# Patient Record
Sex: Female | Born: 1990 | Race: White | Hispanic: No | Marital: Single | Smoking: Never smoker
Health system: Southern US, Community
[De-identification: ages and names within clinical notes are randomized; demographics above are authoritative.]

## PROBLEM LIST (undated history)

## (undated) VITALS — BP 103/65 | HR 98 | Temp 98.1°F | Resp 19

## (undated) DIAGNOSIS — R87629 Unspecified abnormal cytological findings in specimens from vagina: Secondary | ICD-10-CM

## (undated) DIAGNOSIS — I10 Essential (primary) hypertension: Secondary | ICD-10-CM

## (undated) DIAGNOSIS — E0590 Thyrotoxicosis, unspecified without thyrotoxic crisis or storm: Secondary | ICD-10-CM

## (undated) DIAGNOSIS — H3530 Unspecified macular degeneration: Secondary | ICD-10-CM

## (undated) DIAGNOSIS — I619 Nontraumatic intracerebral hemorrhage, unspecified: Secondary | ICD-10-CM

## (undated) DIAGNOSIS — E109 Type 1 diabetes mellitus without complications: Secondary | ICD-10-CM

## (undated) DIAGNOSIS — E039 Hypothyroidism, unspecified: Secondary | ICD-10-CM

## (undated) DIAGNOSIS — Q282 Arteriovenous malformation of cerebral vessels: Secondary | ICD-10-CM

## (undated) DIAGNOSIS — M419 Scoliosis, unspecified: Secondary | ICD-10-CM

## (undated) DIAGNOSIS — F419 Anxiety disorder, unspecified: Secondary | ICD-10-CM

## (undated) DIAGNOSIS — K900 Celiac disease: Secondary | ICD-10-CM

## (undated) DIAGNOSIS — G6181 Chronic inflammatory demyelinating polyneuritis: Secondary | ICD-10-CM

## (undated) HISTORY — PX: OTHER SURGICAL HISTORY: SHX169

## (undated) HISTORY — DX: Arteriovenous malformation of cerebral vessels: Q28.2

## (undated) HISTORY — DX: Celiac disease: K90.0

## (undated) HISTORY — DX: Essential (primary) hypertension: I10

## (undated) HISTORY — DX: Scoliosis, unspecified: M41.9

## (undated) HISTORY — PX: HIP ARTHROPLASTY: SHX981

## (undated) HISTORY — PX: PORTACATH PLACEMENT: SHX2246

## (undated) HISTORY — DX: Unspecified abnormal cytological findings in specimens from vagina: R87.629

## (undated) HISTORY — DX: Hypothyroidism, unspecified: E03.9

## (undated) HISTORY — DX: Anxiety disorder, unspecified: F41.9

## (undated) HISTORY — DX: Nontraumatic intracerebral hemorrhage, unspecified: I61.9

## (undated) HISTORY — PX: FOOT ARTHROPLASTY: SHX1657

## (undated) HISTORY — DX: Type 1 diabetes mellitus without complications: E10.9

## (undated) HISTORY — DX: Unspecified macular degeneration: H35.30

## (undated) HISTORY — DX: Chronic inflammatory demyelinating polyneuritis: G61.81

## (undated) HISTORY — DX: Thyrotoxicosis, unspecified without thyrotoxic crisis or storm: E05.90

---
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
## 2012-02-25 ENCOUNTER — Encounter: Payer: Self-pay | Admitting: "Endocrinology

## 2012-02-25 ENCOUNTER — Other Ambulatory Visit: Payer: Self-pay | Admitting: *Deleted

## 2012-02-25 ENCOUNTER — Ambulatory Visit (INDEPENDENT_AMBULATORY_CARE_PROVIDER_SITE_OTHER): Payer: BC Managed Care – PPO | Admitting: "Endocrinology

## 2012-02-25 VITALS — BP 114/78 | HR 104 | Wt 110.5 lb

## 2012-02-25 DIAGNOSIS — E1049 Type 1 diabetes mellitus with other diabetic neurological complication: Secondary | ICD-10-CM

## 2012-02-25 DIAGNOSIS — E0500 Thyrotoxicosis with diffuse goiter without thyrotoxic crisis or storm: Secondary | ICD-10-CM | POA: Insufficient documentation

## 2012-02-25 DIAGNOSIS — R000 Tachycardia, unspecified: Secondary | ICD-10-CM

## 2012-02-25 DIAGNOSIS — E1043 Type 1 diabetes mellitus with diabetic autonomic (poly)neuropathy: Secondary | ICD-10-CM

## 2012-02-25 DIAGNOSIS — E049 Nontoxic goiter, unspecified: Secondary | ICD-10-CM | POA: Insufficient documentation

## 2012-02-25 DIAGNOSIS — E063 Autoimmune thyroiditis: Secondary | ICD-10-CM

## 2012-02-25 DIAGNOSIS — E1169 Type 2 diabetes mellitus with other specified complication: Secondary | ICD-10-CM

## 2012-02-25 DIAGNOSIS — E11649 Type 2 diabetes mellitus with hypoglycemia without coma: Secondary | ICD-10-CM

## 2012-02-25 DIAGNOSIS — R5381 Other malaise: Secondary | ICD-10-CM

## 2012-02-25 DIAGNOSIS — K900 Celiac disease: Secondary | ICD-10-CM

## 2012-02-25 DIAGNOSIS — IMO0002 Reserved for concepts with insufficient information to code with codable children: Secondary | ICD-10-CM

## 2012-02-25 DIAGNOSIS — E038 Other specified hypothyroidism: Secondary | ICD-10-CM

## 2012-02-25 DIAGNOSIS — G909 Disorder of the autonomic nervous system, unspecified: Secondary | ICD-10-CM

## 2012-02-25 DIAGNOSIS — E1065 Type 1 diabetes mellitus with hyperglycemia: Secondary | ICD-10-CM

## 2012-02-25 DIAGNOSIS — R5383 Other fatigue: Secondary | ICD-10-CM

## 2012-02-25 HISTORY — DX: Autoimmune thyroiditis: E06.3

## 2012-02-25 HISTORY — DX: Thyrotoxicosis with diffuse goiter without thyrotoxic crisis or storm: E05.00

## 2012-02-25 LAB — COMPREHENSIVE METABOLIC PANEL
ALT: 17 U/L (ref 0–35)
AST: 26 U/L (ref 0–37)
Albumin: 3.4 g/dL — ABNORMAL LOW (ref 3.5–5.2)
CO2: 24 mEq/L (ref 19–32)
Calcium: 9.5 mg/dL (ref 8.4–10.5)
Chloride: 95 mEq/L — ABNORMAL LOW (ref 96–112)
Potassium: 3.8 mEq/L (ref 3.5–5.3)
Total Protein: 12.2 g/dL — ABNORMAL HIGH (ref 6.0–8.3)

## 2012-02-25 LAB — T4, FREE: Free T4: 1.45 ng/dL (ref 0.80–1.80)

## 2012-02-25 LAB — TSH: TSH: 1.044 u[IU]/mL (ref 0.350–4.500)

## 2012-02-25 NOTE — Patient Instructions (Addendum)
Follow up visit in 3 months. Please call me in two weeks, on Wednesday or Sunday evenings between 8-10 PM to discuss BG results.

## 2012-02-25 NOTE — Progress Notes (Signed)
CHIEF COMPLAINT: The patient presents for follow-up of type 1 diabetes mellitus, chronic inflammatory demyelinating polyneuropathy (CIDP), celiac disease, muscle weakness, limited endurance, goiter, thyroiditis, tachycardia, autonomic neuropathy, hypoglycemia, hyperthyroidism secondary to Graves' disease, hyperkalemia, anemia, iron deficiency, hypothyroid secondary to Hashimoto's thyroiditis, and fatigue.  HISTORY OF PRESENT ILLNESS: The patient is a 21 year-old Caucasian young woman. The patient was unaccompanied for the first time. 1. This very complicated 21 year-old white female patient was first referred to me on 09/25/06 by her primary care provider, Dr. Garnet Koyanagi, for evaluation and management of type I diabetes mellitus, hypoglycemia, and a myriad of other autoimmune issues.   A. CIDP: At about age 74 the child began to limp.  At age 22 she was evaluated at [facility] and diagnosed with CIDP. At the time I first met her she was under the care of Dr. Marcene Brawn of [facility] in [place]. She was on a regular regimen of intravenous treatments at home with IVIG and steroids on the days of therapy. The IVIG treatments were given monthly. She was also on cyclosporine A daily, alternating doses every other day. Because of the CIDP she had  significant problems with muscle atrophy and weakness, as well as multiple orthopedic issues. She was also being followed by Dr. Simonne Come, an orthopedist in [place]. On that first visit her legs and arms were quite weak. She found it very hard to walk or to exercise.  B. Type 1 diabetes mellitus: Her diabetes mellitus was also diagnosed at age 51, just prior to the diagnosis of CIDP. She had been started on a Medtronic Paradigm 712 insulin pump subsequently and was using Humalog lispro insulin in her pump. She had a very difficult time with blood glucose control. At the time of her steroids, her sugars tended to run very high. However, when she  was able to perform some physical activities, she tended to develop hypoglycemia. Her hemoglobin A1c on that first visit was 9.5%.  C. Celiac disease: The patient was diagnosed with celiac disease at approximately age 10. She had been on a gluten-free diet ever since.  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 She was followed both at [facility] and [facility] eye clinic. 2. Since that initial visit with me, I have followed her for the problems listed above plus other new problems that have developed over time.   A. CIDP: Her CIDP course has waxed and waned. At one point she had improved significantly, her medication doses had been reduced, she felt very much stronger, had more stamina, and was doing very well. Unfortunately, she later had a significant flare-up of CIDP and lost much of the improvement that she had gained. Since going to [facility] this past Spring, however, and starting a new and more intensified IVIG regimen, her strength has improved. She is still very weak physically and muscularly, however, and needs support to walk.  B. Type 1 diabetes mellitus: The patient's blood sugars have varied significantly with the course of her CIDP and the treatments for that disease. Her hemoglobin A1c values have ranged from a low of 8.3 to a high of 11.5. Most of her A1c values have been in the 8.4-9.6 range.  C. Celiac disease: The patient has really done quite well over the years. She rarely has signs or symptoms of abdominal problems. Because her antibody levels fluctuate a  great deal, she developed negative celiac antibodies in March 2012. She was mistakenly told that she did not have celiac disease and that she could resume a normal diet. As expected, resumption of a normal diet caused a recurrence of active celiac disease. In October she saw a gastroenterologist, Dr.  Gale Journey, at [facility] who told her that she did have celiac disease. She is now back on her gluten-free diet and is asymptomatic again.  D. Autoimmune thyroid disease: The patient had a goiter and a TPO antibody level of 287.2 on her first visit in May of 2008, consistent with evolving Hashimoto's Disease. She occasionally had the sensation of swelling and discomfort in her anterior neck, which was also c/w flare-ups of Hashimoto's Disease. In early 2009 she developed increased tachycardia. Lab tests performed in March 2000 by her primary care provider showed a TSH of 0.01 and a T4 of 7.7. It was felt that the increase in tachycardia was likely due to a combination of autonomic neuropathy and Hashitoxicosis.  However on follow-up laboratory tests performed in July, her TSH was 0.05, free T4 3.9, and free T3 14.2. Her TSI level done on the assay used at the time was 2.2, with normal being 1.0 or less. It was evident that she had developed Graves' Disease in the setting of pre-existing Hashimoto's Disease.  I initiated treatment with methimazole, 10 mg twice daily and propranolol 20 mg 3 times daily. Since then we've seen significant swings of her thyroid function tests, partly due to changes in methimazole doses, but also partly due to changes in her immunosuppressant therapies. During the last 2 years her TSH values have ranged from a low of 0.1432 to a high of 14.933. The dose of methimazole has been adjusted as needed to treat her Graves' disease. It has been my hope that her Hashimoto's disease will eventually cancel out her Graves' disease. 3.The patient's last [facility] visit was on 10/22/11. In the interim, she has admittedly not been doing a very good job of taking care of her DM. She has been concentrating more on her CIDP and her celiac disease. She has again reduced her steroid dosage by 25% because she wants to try to taper off from the steroids if possible. Since reducing the steroids she feels weaker, but wants  to give the taper a good try. She still receives her IVIG regimen on Mondays and Thursdays. She receives steroids now on every other Monday, 250 mg of methylprednisolone by IV. She takes methimazole, 2.5 mg daily and lisinopril 2.5 daily. She is also taking propranolol, ranitidine, gabapentin, iron, and vitamin D. 4. Pertinent Review of Systems: Constitutional: The patient feels "good". She feels weak every day, both physically in her arms and legs and in terms of stamina.   Eyes: Her new contact lenses are helping. Last eye exam was in March or April of this year.  Neck: The patient has had no problems with anterior neck swelling and tenderness for a year or more.   Heart: Heart rate increases with exercise or other physical activity. The patient has no complaints of palpitations, irregular heart beats, chest pain, or chest pressure.   Gastrointestinal: Bowel movents seem normal. The patient has no complaints of excessive hunger, acid reflux, upset stomach, stomach aches or pains, diarrhea, or constipation.  Legs: Muscle mass and strength seem to be improved. There are few complaints of numbness, tingling, burning, or pain. No edema is noted.  Feet: There are no obvious new foot problems. There are  no complaints of numbness, tingling, burning, or pain. No edema is noted. Neurologic: She can walk with assistance better and can walk for limited distances without support. Muscle movement and strength have improved in her arms and legs. Sensation seems to be normal. Coordination is better. GYN: Her LMP was one week ago. Menses have been more regular lately.   Hypoglycemia: She has hypoglycemia sometimes in the afternoons.  5. Blood glucose printout: She changes her pump site every 1-7 days. She has only checked BGs 4 times in the pat month. All BG were > 400. She says that she does not feel depressed. On the contrary, she feels better than ever.   PAST MEDICAL, FAMILY, AND SOCIAL HISTORY: 1. School: The  patient finished high school. She takes some courses on line with [employer].   2. Activities: She plays the drums for exercise. She is due to go back to [facility] in March 2014 for her annual checkup. 3. Smoking, alcohol, or drugs: None 4. Primary Care Provider: Dr. Garnet Koyanagi at [facility].  ROS: There are no other significant problems involving the patient's other body systems.  PHYSICAL EXAM: BP 114/78  Pulse 104  Wt 110 lb 8 oz (50.122 kg) Hemoglobin A1c is 10.7% today, compared with  9.7% at last visit and with 8.9% at the visit prior. Her affect is fairly flat. Constitutional: This patient appears reasonably healthy, but weaker than at last visit. The patient's height is at the 46% and her weight is at the 18%. She needs the help of her mother to stand from a sitting position and to walk.   Head: The head is normocephalic. Face: The face appears normal. There are no obvious dysmorphic features. Eyes: The eyes appear to be normally formed and spaced. Gaze is conjugate. There is no obvious arcus or proptosis. Moisture appears normal. Mouth: The oropharynx and tongue appear normal. Dentition appears to be normal for age. Oral moisture is normal. Neck: The neck appears to be visibly normal. No carotid bruits are noted. The thyroid gland is the 22-23 grams in size. The gland is relatively globular and firm, but the left lobe is smaller and softer today. The thyroid gland is not tender to palpation. Lungs: The lungs are clear to auscultation. Air movement is good. Heart: Heart rate and rhythm are regular. Heart sounds S1 and S2 are normal. I did not appreciate any pathologic cardiac murmurs. Abdomen: The abdomen is normal in size for the patient's age. Bowel sounds are normal. There is no obvious hepatomegaly, splenomegaly, or other mass effect.  Arms: Muscle size and bulk are low-normal for age. Hands: There is a slight tremor today. Phalangeal and metacarpophalangeal joints are normal. Palmar  muscles are low-normal for age. Palmar skin is normal. Palmar moisture is also normal. Legs: Muscles appear low-normal for age. No edema is present. Feet: Her feet are cool. Dorsalis pedal pulses are 1+ on the right and 2+ on the left.   Neurologic: Strength is 4-5/5 for age in the upper extremities and 3-4/5 in the right leg and 4+/5 in the left leg. Muscle tone is relatively normal. Sensation to touch is normal in both the legs and feet.    LAB DATA: 06/14/11: TSH was 1.633. Free T4 1.47. Free T3 was 3.1. TSI was 56 (normal less than 120).                       ASSESSMENT: 1. Type 1 diabetes mellitus: The patient's BG control  has worsened as a result of her not being as compliant as she could be. 2. Hypoglycemia: None    3. Chronic inflammatory demyelinating polyneuropathy (CIDP) and muscle weakness: She is not doing as well since reducing her steroids. She feels subjectively weaker.    4. Autoimmune thyroid disease: The patient is clinically euthyroid today or perhaps mildly hypothyroid. We'll see what her TFts show. I hope that we will be able to stop the methimazole within this calendar year.  5. Goiter: Thyroid is smaller and somewhat less firm today. 6. Autonomic neuropathy with tachycardia: The heart rate is higher, c/w poorer BG control.  7. Hashimoto's thyroiditis: The patient's thyroiditis is clinically quiescent at this time. I expect that as her Hashimoto's disease progressively destroys more thyroid cells, we will be able to taper her methimazole to zero. She will eventually be permanently hypothyroid and be on Synthroid hormone replacement. 8. Fatigue and decreased stamina: These problems have deteriorated somewhat since reducing her steroid dose.   9. Thyrotoxicosis: Her diffuse thyrotoxicosis, secondary to Graves' disease, is gradually improving over time with methimazole therapy.  10. Celiac disease: Patient is back on her gluten-free diet. She is asymptomatic.  11. Emotionality:  The patient is doing better.  PLAN: 1. Diagnostic: She went to the lab today to have blood drawn for thyroid function tests, TSI, CMP and urine for  microalbumin/creatinine ratio. Call in two weeks to discuss BGs and possible changes to there insulin Plan.  2. Therapeutic: The patient will maintain her current insulin pump basal rate settings. I suggested again that she use increased temporary basal rates of 110-120% on days she receives methylprednisolone therapy to control BGs better. Unfortunately, she is unwilling to follow that suggestion. I will adjust methimazole doses according to her TFT results.  3. Patient education: We discussed the need to be as consistent as possible with pump site changes, BG checks, and insulin boluses. Given the various effects that her methylprednisolone and IVIG can have on her BGs day-to-day, The more consistent she can be, the fewer variables we will have to deal with.  4. Follow-up: 3 months  Level of Service: This visit lasted in excess of 40 minutes. More than 50% of the visit was devoted to counseling.  Sherrlyn Hock

## 2012-02-26 ENCOUNTER — Telehealth: Payer: Self-pay | Admitting: *Deleted

## 2012-02-26 NOTE — Telephone Encounter (Signed)
Per in-basket message from Maryfrances Bunnell, Mom called requesting that [facility] lab results when they come in be faxed to Dr. Rush Landmark Hickling's office. I returned Mom's call to let her  Know that Dr. Melanee Left office has access to [facility] and can retrieve Jessica Kelley's lab results there.

## 2012-03-10 ENCOUNTER — Ambulatory Visit: Payer: BC Managed Care – PPO | Admitting: "Endocrinology

## 2012-04-22 ENCOUNTER — Other Ambulatory Visit: Payer: Self-pay | Admitting: "Endocrinology

## 2012-04-27 ENCOUNTER — Other Ambulatory Visit: Payer: Self-pay | Admitting: *Deleted

## 2012-04-27 DIAGNOSIS — E1065 Type 1 diabetes mellitus with hyperglycemia: Secondary | ICD-10-CM

## 2012-04-27 MED ORDER — INSULIN ASPART 100 UNIT/ML ~~LOC~~ SOLN: SUBCUTANEOUS | Status: DC

## 2012-05-03 ENCOUNTER — Encounter (HOSPITAL_BASED_OUTPATIENT_CLINIC_OR_DEPARTMENT_OTHER): Payer: Self-pay | Admitting: *Deleted

## 2012-05-03 ENCOUNTER — Emergency Department (HOSPITAL_BASED_OUTPATIENT_CLINIC_OR_DEPARTMENT_OTHER): Payer: BC Managed Care – PPO

## 2012-05-03 ENCOUNTER — Emergency Department (HOSPITAL_BASED_OUTPATIENT_CLINIC_OR_DEPARTMENT_OTHER)
Admission: EM | Admit: 2012-05-03 | Discharge: 2012-05-04 | Disposition: A | Discharge status: Home / Self Care | Payer: BC Managed Care – PPO | Attending: Emergency Medicine | Admitting: Emergency Medicine

## 2012-05-03 DIAGNOSIS — Z794 Long term (current) use of insulin: Secondary | ICD-10-CM | POA: Insufficient documentation

## 2012-05-03 DIAGNOSIS — Z8669 Personal history of other diseases of the nervous system and sense organs: Secondary | ICD-10-CM | POA: Insufficient documentation

## 2012-05-03 DIAGNOSIS — K900 Celiac disease: Secondary | ICD-10-CM | POA: Insufficient documentation

## 2012-05-03 DIAGNOSIS — E039 Hypothyroidism, unspecified: Secondary | ICD-10-CM | POA: Insufficient documentation

## 2012-05-03 DIAGNOSIS — Q859 Phakomatosis, unspecified: Secondary | ICD-10-CM | POA: Insufficient documentation

## 2012-05-03 DIAGNOSIS — S0101XA Laceration without foreign body of scalp, initial encounter: Secondary | ICD-10-CM

## 2012-05-03 DIAGNOSIS — E109 Type 1 diabetes mellitus without complications: Secondary | ICD-10-CM | POA: Insufficient documentation

## 2012-05-03 DIAGNOSIS — Z79899 Other long term (current) drug therapy: Secondary | ICD-10-CM | POA: Insufficient documentation

## 2012-05-03 DIAGNOSIS — Y92009 Unspecified place in unspecified non-institutional (private) residence as the place of occurrence of the external cause: Secondary | ICD-10-CM | POA: Insufficient documentation

## 2012-05-03 DIAGNOSIS — W010XXA Fall on same level from slipping, tripping and stumbling without subsequent striking against object, initial encounter: Secondary | ICD-10-CM | POA: Insufficient documentation

## 2012-05-03 DIAGNOSIS — Y9301 Activity, walking, marching and hiking: Secondary | ICD-10-CM | POA: Insufficient documentation

## 2012-05-03 DIAGNOSIS — S0990XA Unspecified injury of head, initial encounter: Secondary | ICD-10-CM | POA: Insufficient documentation

## 2012-05-03 DIAGNOSIS — M4120 Other idiopathic scoliosis, site unspecified: Secondary | ICD-10-CM | POA: Insufficient documentation

## 2012-05-03 DIAGNOSIS — Z872 Personal history of diseases of the skin and subcutaneous tissue: Secondary | ICD-10-CM | POA: Insufficient documentation

## 2012-05-03 NOTE — ED Notes (Addendum)
Pt. Has a hx CIDP causing general weakness with right side being the worst. Pt was walking into her house, lost her balance and fell hitting the posterior aspect of her head. Denies loc. Unable to assess wound in triage due to dried blood in her hair. Denies any vomiting. Pt. C/o intermittent dizziness

## 2012-05-03 NOTE — ED Notes (Signed)
Patient fell at home about forty min. Ago, laceration to back of head with above normal blood loss. Patient's father stated she did not lose concioussness. Father states patient has right leg numbness but feels this is from her auto-immune decease that is pre-existing and that she is a diabetic.

## 2012-05-04 MED ORDER — LIDOCAINE HCL 2 % IJ SOLN
INTRAMUSCULAR | Status: AC
  Filled 2012-05-04: qty 20

## 2012-05-04 MED ORDER — LIDOCAINE-EPINEPHRINE-TETRACAINE (LET) SOLUTION
3.0000 mL | Freq: Once | NASAL | Status: AC
  Administered 2012-05-04: 3 mL via TOPICAL
  Filled 2012-05-04: qty 3

## 2012-05-04 MED ORDER — HYDROCODONE-ACETAMINOPHEN 5-500 MG PO TABS: 1.0000 | ORAL_TABLET | Freq: Four times a day (QID) | ORAL | Status: DC | PRN

## 2012-05-04 MED ORDER — LORAZEPAM 1 MG PO TABS
0.5000 mg | ORAL_TABLET | Freq: Once | ORAL | Status: AC
  Administered 2012-05-04: 0.5 mg via ORAL
  Filled 2012-05-04: qty 1

## 2012-05-04 NOTE — ED Provider Notes (Signed)
History  This chart was scribed for Syliva Mee Alfonso Patten, MD by Roe Coombs, ED Scribe. The patient was seen in room MH11/MH11. Patient's care was started at [facility].  CSN: [id number]  Arrival date & time 05/03/12  2229   First MD Initiated Contact with Patient 05/04/12 0005      Chief Complaint  Patient presents with  . Fall    Patient is a 21 y.o. female presenting with scalp laceration and extremity pain. The history is provided by the patient. No language interpreter was used.  Head Laceration This is a new problem. The current episode started 3 to 5 hours ago. The problem occurs constantly. The problem has not changed since onset.Nothing aggravates the symptoms. Nothing relieves the symptoms. She has tried nothing for the symptoms.  Extremity Pain This is a new problem. Episode onset: 2.5 hours ago. The problem occurs constantly. The problem has not changed since onset.   HPI Comments: Jessica Kelley is a 21 y.o. female who presents to the Emergency Department complaining of laceration to the occipital area of the head and moderate, constant, non-radiating, dull right elbow pain resulting from a fall that occurred 2.5 hours ago. Patient says there was a moderate amount of blood loss from the wound site. There is associated dizziness. Patient denies loss of consciousness or seizure activity. Patient states that she fell at home onto concrete immediately PTA. She says that she generally has balance problems with baseline weakness in her right lower extremity which probably contributed to her fall. Patient denies any other injuries or symptoms at this time. Patient has a medical history of Type I diabetes, macular degeneration, celiac disease, hypothyroidism, Wyburn-Mason syndrome, scoliosis, chronic inflammatory demyelinating polyneuropathy and hypothyroidism.  Tdap is UTD. Patient does not smoke.    Past Medical History  Diagnosis Date  . Diabetes type I     Dr. Karsten Ro  . Celiac  disease   . Macular degeneration   . Hypothyroidism   . Wyburn-Mason syndrome   . Glaucoma(365)   . Scoliosis   . CIDP (chronic inflammatory demyelinating polyneuropathy) 2001  . Pilonidal cyst     Past Surgical History  Procedure Date  . Cataract extraction 06/25/06  . Glaucoma surgery 02/12/06  . Foot arthroplasty     left foot moved and stretched tendons   . Hip arthroplasty     right never bipopsy  . Portacath placement 2002-2004    Family History  Problem Relation Age of Onset  . Prostate cancer Maternal Grandfather   . Cancer Maternal Grandfather     prostate  . Coronary artery disease Paternal Grandfather   . Hyperlipidemia    . Diabetes Sister   . Other Sister     thyroid issues  . Thyroid disease Mother     History  Substance Use Topics  . Smoking status: Never Smoker   . Smokeless tobacco: Never Used     Comment: passive smoke exposure  . Alcohol Use: No    OB History    Grav Para Term Preterm Abortions TAB SAB Ect Mult Living                  Review of Systems  Musculoskeletal: Positive for arthralgias (Positive for right elbow pain.).  Skin: Positive for wound (lac to occipital area).  Neurological: Positive for dizziness. Negative for seizures.  All other systems reviewed and are negative.    Allergies  Review of patient's allergies indicates no known allergies.  Home Medications  Current Outpatient Rx  Name  Route  Sig  Dispense  Refill  . VITAMIN D 1000 UNITS PO CAPS   Oral   Take 1,000 Units by mouth daily.           Marland Kitchen VITAMIN B-12 PO   Oral   Take by mouth.         . FERROUS SULFATE 27 MG PO TABS   Oral   Take by mouth 2 (two) times daily.           Marland Kitchen GABAPENTIN 100 MG PO CAPS   Oral   Take 100 mg by mouth 3 (three) times daily.           . IBUPROFEN 200 MG PO TABS   Oral   Take 600 mg by mouth every 8 (eight) hours. 2 days per week following treatment          . IMMUNE GLOBULIN (HUMAN) 10 G IV SOLR    Intravenous   Inject 50 mg into the vein 2 (two) times a week.          . INSULIN ASPART 100 UNIT/ML [place] SOLN      Use with insulin pump, dispense 4 vials   10 mL   6   . LISINOPRIL 2.5 MG PO TABS   Oral   Take 1 tablet (2.5 mg total) by mouth daily.   30 tablet   11   . METHIMAZOLE 5 MG PO TABS   Oral   Take 0.5 tablets (2.5 mg total) by mouth daily.   16 tablet   3   . METHYLPREDNISOLONE 2 MG PO TABS   Intravenous   Inject 250 mg into the vein every 30 (thirty) days.          Marland Kitchen ONE-DAILY MULTI VITAMINS PO TABS   Oral   Take 1 tablet by mouth daily.           Marland Kitchen MYCOPHENOLATE MOFETIL 500 MG PO TABS   Oral   Take 1,000 mg by mouth 2 (two) times daily.          Marland Kitchen PRESCRIPTION MEDICATION      Gets IVIG treatment twice a week...could not verify concentration and dose          . PROPRANOLOL HCL 20 MG PO TABS      TAKE ONE TABLET BY MOUTH THREE TIMES DAILY   90 tablet   5   . RANITIDINE HCL 150 MG PO TABS   Oral   Take 150 mg by mouth 2 (two) times daily.           . SODIUM CHLORIDE 0.9 % IV SOLN   Intravenous   Inject into the vein continuous.             Triage Vitals: BP 134/88  Pulse 106  Temp 97.6 F (36.4 C) (Oral)  Resp 23  Ht 5' 4"  (1.626 m)  Wt 110 lb (49.896 kg)  BMI 18.88 kg/m2  SpO2 97%  LMP 04/26/2012  Physical Exam  Constitutional: She is oriented to person, place, and time. She appears well-developed and well-nourished. No distress.  HENT:  Head: Head is with laceration.    Right Ear: No hemotympanum.  Left Ear: No hemotympanum.  Mouth/Throat: Oropharynx is clear and moist.  Eyes: Pupils are equal, round, and reactive to light.  Neck: Normal range of motion. Neck supple.       No C spine tenderness  Cardiovascular: Normal rate, regular rhythm  and normal heart sounds.   No murmur heard. Pulmonary/Chest: Effort normal and breath sounds normal. No respiratory distress. She has no wheezes. She has no rales.  Abdominal:  Soft. Bowel sounds are normal. There is no tenderness. There is no rebound and no guarding.  Musculoskeletal: Normal range of motion. She exhibits no edema and no tenderness.       Full ROM of right elbow. Normal pronation and supination of the elbow. Biceps tendon is intact.   Negative anterior and posterior drawer test of the knees.   Neurological: She is alert and oriented to person, place, and time.  Skin: Skin is warm and dry.  Psychiatric: She has a normal mood and affect.    ED Course  Procedures (including critical care time) DIAGNOSTIC STUDIES: Oxygen Saturation is 97% on room air, adequate by my interpretation.    COORDINATION OF CARE: 12:32 AM- Patient informed of current plan for treatment and evaluation and agrees with plan at this time.   Labs Reviewed - No data to display  Ct Head Wo Contrast  05/04/2012  *RADIOLOGY REPORT*  Clinical Data: Fall, posterior head trauma.  CT HEAD WITHOUT CONTRAST  Technique:  Contiguous axial images were obtained from the base of the skull through the vertex without contrast.  Comparison: 06/04/2007 sinus CT  Findings: There is no evidence for acute hemorrhage, hydrocephalus, mass lesion, or abnormal extra-axial fluid collection.  No definite CT evidence for acute infarction.  The visualized paranasal sinuses and mastoid air cells are predominately clear.  Left posterior scalp swelling.  No underlying calvarial fracture.  IMPRESSION: Left posterior scalp swelling without underlying calvarial fracture or acute intracranial abnormality.   Original Report Authenticated By: Carlos Levering, M.D.      No diagnosis found.    MDM  LACERATION REPAIR Performed by: Carlisle Beers Authorized by: Carlisle Beers Consent: Verbal consent obtained. Risks and benefits: risks, benefits and alternatives were discussed Consent given by: patient Patient identity confirmed: provided demographic data Prepped and Draped in normal sterile  fashion Wound explored  Laceration Location: scalp posterior  2 cm  No Foreign Bodies seen or palpated  Anesthesia: topical first with LET and then  local infiltration  Local anesthetic: lidocaine 2%  Anesthetic total: 4 ml  Irrigation method: syringe Amount of cleaning: extensive   Skin closure: staples  Number: 4  Patient tolerance: Patient tolerated the procedure well with no immediate complications.    Follow up for staple removal in 7 days return for drainage fever or any concerns.  Family and patient verbalize understanding and agree to follow up     I personally performed the services described in this documentation, which was scribed in my presence. The recorded information has been reviewed and is accurate.     Carlisle Beers, MD 05/04/12 [phone]

## 2012-05-11 ENCOUNTER — Encounter (HOSPITAL_COMMUNITY): Payer: Self-pay | Admitting: Emergency Medicine

## 2012-05-11 ENCOUNTER — Emergency Department (INDEPENDENT_AMBULATORY_CARE_PROVIDER_SITE_OTHER)
Admission: EM | Admit: 2012-05-11 | Discharge: 2012-05-11 | Disposition: A | Discharge status: Home / Self Care | Payer: BC Managed Care – PPO | Source: Home / Self Care | Attending: Family Medicine | Admitting: Family Medicine

## 2012-05-11 DIAGNOSIS — Z4802 Encounter for removal of sutures: Secondary | ICD-10-CM

## 2012-05-11 NOTE — ED Provider Notes (Signed)
History     CSN: [id number]  Arrival date & time 05/11/12  1551   First MD Initiated Contact with Patient 05/11/12 1715      Chief Complaint  Patient presents with  . Suture / Staple Removal    (Consider location/radiation/quality/duration/timing/severity/associated sxs/prior treatment) Patient is a 21 y.o. female presenting with suture removal. The history is provided by the patient and a parent.  Suture / Staple Removal  The sutures were placed 7 to 10 days ago. There has been no treatment since the wound repair. Her temperature was unmeasured prior to arrival. There has been no drainage from the wound. There is no redness present. There is no swelling present. The pain has no pain.    Past Medical History  Diagnosis Date  . Diabetes type I     Dr. Karsten Ro  . Celiac disease   . Macular degeneration   . Hypothyroidism   . Wyburn-Mason syndrome   . Glaucoma(365)   . Scoliosis   . CIDP (chronic inflammatory demyelinating polyneuropathy) 2001  . Pilonidal cyst     Past Surgical History  Procedure Date  . Cataract extraction 06/25/06  . Glaucoma surgery 02/12/06  . Foot arthroplasty     left foot moved and stretched tendons   . Hip arthroplasty     right never bipopsy  . Portacath placement 2002-2004    Family History  Problem Relation Age of Onset  . Prostate cancer Maternal Grandfather   . Cancer Maternal Grandfather     prostate  . Coronary artery disease Paternal Grandfather   . Hyperlipidemia    . Diabetes Sister   . Other Sister     thyroid issues  . Thyroid disease Mother     History  Substance Use Topics  . Smoking status: Never Smoker   . Smokeless tobacco: Never Used     Comment: passive smoke exposure  . Alcohol Use: No    OB History    Grav Para Term Preterm Abortions TAB SAB Ect Mult Living                  Review of Systems  Constitutional: Negative.   Skin: Positive for wound.    Allergies  Review of patient's allergies indicates  no known allergies.  Home Medications   Current Outpatient Rx  Name  Route  Sig  Dispense  Refill  . GABAPENTIN 100 MG PO CAPS   Oral   Take 100 mg by mouth 3 (three) times daily.           Marland Kitchen LISINOPRIL 2.5 MG PO TABS   Oral   Take 1 tablet (2.5 mg total) by mouth daily.   30 tablet   11   . METHIMAZOLE 5 MG PO TABS   Oral   Take 0.5 tablets (2.5 mg total) by mouth daily.   16 tablet   3   . MYCOPHENOLATE MOFETIL 500 MG PO TABS   Oral   Take 1,000 mg by mouth 2 (two) times daily.          Marland Kitchen PROPRANOLOL HCL 20 MG PO TABS      TAKE ONE TABLET BY MOUTH THREE TIMES DAILY   90 tablet   5   . VITAMIN D 1000 UNITS PO CAPS   Oral   Take 1,000 Units by mouth daily.           Marland Kitchen VITAMIN B-12 PO   Oral   Take by mouth.         Marland Kitchen  FERROUS SULFATE 27 MG PO TABS   Oral   Take by mouth 2 (two) times daily.           Marland Kitchen HYDROCODONE-ACETAMINOPHEN 5-500 MG PO TABS   Oral   Take 1 tablet by mouth every 6 (six) hours as needed for pain.   5 tablet   0   . IBUPROFEN 200 MG PO TABS   Oral   Take 600 mg by mouth every 8 (eight) hours. 2 days per week following treatment          . IMMUNE GLOBULIN (HUMAN) 10 G IV SOLR   Intravenous   Inject 50 mg into the vein 2 (two) times a week.          . INSULIN ASPART 100 UNIT/ML [place] SOLN      Use with insulin pump, dispense 4 vials   10 mL   6   . METHYLPREDNISOLONE 2 MG PO TABS   Intravenous   Inject 250 mg into the vein every 30 (thirty) days.          Marland Kitchen ONE-DAILY MULTI VITAMINS PO TABS   Oral   Take 1 tablet by mouth daily.           Marland Kitchen PRESCRIPTION MEDICATION      Gets IVIG treatment twice a week...could not verify concentration and dose          . RANITIDINE HCL 150 MG PO TABS   Oral   Take 150 mg by mouth 2 (two) times daily.           . SODIUM CHLORIDE 0.9 % IV SOLN   Intravenous   Inject into the vein continuous.             BP 106/72  Pulse 91  Temp 97.1 F (36.2 C) (Oral)  Resp 18   SpO2 98%  LMP 04/26/2012  Physical Exam  Nursing note and vitals reviewed. Constitutional: She is oriented to person, place, and time. She appears well-developed and well-nourished.  Neurological: She is alert and oriented to person, place, and time.  Skin: Skin is warm and dry.       Scalp lac well healed , 4 staples removed.no problems.    ED Course  Procedures (including critical care time)  Labs Reviewed - No data to display No results found.   1. Encounter for removal of staples       MDM          Billy Fischer, MD 05/11/12 1739

## 2012-05-11 NOTE — ED Notes (Signed)
Pt is here to have staples removed... Was seen on 05/03/12 at [facility]... Reports she loss her balance and fell down 4 steps on to concrete floor... She is alert w/no signs of acute distress.

## 2012-06-03 ENCOUNTER — Encounter: Payer: Self-pay | Admitting: "Endocrinology

## 2012-06-03 ENCOUNTER — Ambulatory Visit (INDEPENDENT_AMBULATORY_CARE_PROVIDER_SITE_OTHER): Payer: BC Managed Care – PPO | Admitting: "Endocrinology

## 2012-06-03 VITALS — BP 101/71 | HR 110 | Wt 114.2 lb

## 2012-06-03 DIAGNOSIS — E063 Autoimmune thyroiditis: Secondary | ICD-10-CM

## 2012-06-03 DIAGNOSIS — E1169 Type 2 diabetes mellitus with other specified complication: Secondary | ICD-10-CM

## 2012-06-03 DIAGNOSIS — M6281 Muscle weakness (generalized): Secondary | ICD-10-CM

## 2012-06-03 DIAGNOSIS — E1065 Type 1 diabetes mellitus with hyperglycemia: Secondary | ICD-10-CM

## 2012-06-03 DIAGNOSIS — K900 Celiac disease: Secondary | ICD-10-CM

## 2012-06-03 DIAGNOSIS — R000 Tachycardia, unspecified: Secondary | ICD-10-CM

## 2012-06-03 DIAGNOSIS — R5383 Other fatigue: Secondary | ICD-10-CM

## 2012-06-03 DIAGNOSIS — E11649 Type 2 diabetes mellitus with hypoglycemia without coma: Secondary | ICD-10-CM

## 2012-06-03 DIAGNOSIS — E1149 Type 2 diabetes mellitus with other diabetic neurological complication: Secondary | ICD-10-CM

## 2012-06-03 DIAGNOSIS — G6181 Chronic inflammatory demyelinating polyneuritis: Secondary | ICD-10-CM

## 2012-06-03 DIAGNOSIS — E0500 Thyrotoxicosis with diffuse goiter without thyrotoxic crisis or storm: Secondary | ICD-10-CM

## 2012-06-03 DIAGNOSIS — R5381 Other malaise: Secondary | ICD-10-CM

## 2012-06-03 DIAGNOSIS — E1143 Type 2 diabetes mellitus with diabetic autonomic (poly)neuropathy: Secondary | ICD-10-CM

## 2012-06-03 DIAGNOSIS — G909 Disorder of the autonomic nervous system, unspecified: Secondary | ICD-10-CM

## 2012-06-03 LAB — COMPREHENSIVE METABOLIC PANEL
ALT: 16 U/L (ref 0–35)
AST: 33 U/L (ref 0–37)
Creat: 0.57 mg/dL (ref 0.50–1.10)
Total Bilirubin: 0.2 mg/dL — ABNORMAL LOW (ref 0.3–1.2)

## 2012-06-03 LAB — POCT GLYCOSYLATED HEMOGLOBIN (HGB A1C): Hemoglobin A1C: 14

## 2012-06-03 LAB — TSH: TSH: 0.133 u[IU]/mL — ABNORMAL LOW (ref 0.350–4.500)

## 2012-06-03 LAB — GLUCOSE, POCT (MANUAL RESULT ENTRY): POC Glucose: 298 mg/dl — AB (ref 70–99)

## 2012-06-03 LAB — T3, FREE: T3, Free: 3.2 pg/mL (ref 2.3–4.2)

## 2012-06-03 NOTE — Progress Notes (Signed)
CHIEF COMPLAINT: The patient presents for follow-up of type 1 diabetes mellitus, chronic inflammatory demyelinating polyneuropathy (CIDP), celiac disease, muscle weakness, limited endurance, goiter, thyroiditis, tachycardia, autonomic neuropathy, hypoglycemia, hyperthyroidism secondary to Graves' disease, hyperkalemia, anemia, iron deficiency, hypothyroid secondary to Hashimoto's thyroiditis, and fatigue.  HISTORY OF PRESENT ILLNESS: The patient is a 22 year-old Caucasian young woman. The patient was accompanied by her mother. 1. This very complicated 47 year-old white female patient was first referred to me on 09/25/06 by her primary care provider, Dr. Garnet Koyanagi, for evaluation and management of type I diabetes mellitus, hypoglycemia, and a myriad of other autoimmune issues.   A. CIDP: At about age 42 the child began to limp.  At age 75 she was evaluated at [facility] and diagnosed with CIDP. At the time I first met her she was under the care of Dr. Marcene Brawn of [facility] in [place]. She was on a regular regimen of intravenous treatments at home with IVIG and steroids on the days of therapy. The IVIG treatments were given monthly. She was also on cyclosporine A daily, alternating doses every other day. Because of the CIDP she had  significant problems with muscle atrophy and weakness, as well as multiple orthopedic issues. She was also being followed by Dr. Simonne Come, an orthopedist in [place]. On that first visit her legs and arms were quite weak. She found it very hard to walk or to exercise.  B. Type 1 diabetes mellitus: Her diabetes mellitus was also diagnosed at age 71, just prior to the diagnosis of CIDP. She had been started on a Medtronic Paradigm 712 insulin pump subsequently and was using Humalog lispro insulin in her pump. She had a very difficult time with blood glucose control. At the time of her steroids, her sugars tended to run very high. However, when she was  able to perform some physical activities, she tended to develop hypoglycemia. Her hemoglobin A1c on that first visit was 9.5%.  C. Celiac disease: The patient was diagnosed with celiac disease at approximately age 70. She had been on a gluten-free diet ever since.  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 She was followed both at [facility] and [facility] eye clinic. 2. Since that initial visit with me, I have followed her for the problems listed above plus other new problems that have developed over time.   A. CIDP: Her CIDP course has waxed and waned. At one point she had improved significantly, her medication doses had been reduced, she felt very much stronger, had more stamina, and was doing very well. Unfortunately, she later had a significant flare-up of CIDP and lost much of the improvement that she had gained. Since going to [facility] this past Spring, however, and starting a new and more intensified IVIG regimen, her strength had improved. She was still very weak physically and muscularly, however, and needed support to walk. As noted below, after tapering her steroids recently, her weakness has worsened.  B. Type 1 diabetes mellitus: The patient's blood sugars have varied significantly with the course of her CIDP and the treatments for that disease. Her hemoglobin A1c values have ranged from a low of 8.3 to a high of 11.5. Most of her A1c values have been in the 8.4-9.6 range.   C. Celiac disease: The patient has really done quite well over the years. She rarely has  signs or symptoms of abdominal problems. Because her antibody levels fluctuate a great deal, her celiac disease antibodies were negative in March 2012. She was mistakenly told that she did not have celiac disease and that she could resume a normal diet. As expected, resumption of a normal diet caused  a recurrence of active celiac disease. In October she saw a gastroenterologist, Dr. Gale Journey, at [facility] who told her that she did have celiac disease. She is now back on her gluten-free diet and is asymptomatic again.  D. Autoimmune thyroid disease: The patient had a goiter and a TPO antibody level of 287.2 on her first visit in May of 2008, consistent with evolving Hashimoto's Disease. She occasionally had the sensation of swelling and discomfort in her anterior neck, which was also c/w flare-ups of Hashimoto's Disease. In early 2009 she developed increased tachycardia. Lab tests performed in March 2000 by her primary care provider showed a TSH of 0.01 and a T4 of 7.7. It was felt that the increase in tachycardia was likely due to a combination of autonomic neuropathy and Hashitoxicosis.  However on follow-up laboratory tests performed in July, her TSH was 0.05, free T4 3.9, and free T3 14.2. Her TSI level done on the assay used at the time was 2.2, with normal being 1.0 or less. It was then evident that she had developed Graves' Disease in the setting of pre-existing Hashimoto's Disease.  I initiated treatment with methimazole, 10 mg twice daily and propranolol 20 mg 3 times daily. Since then we've seen significant swings of her thyroid function tests, partly due to changes in methimazole doses, but also partly due to changes in her immunosuppressant therapies. During the last 2 years her TSH values have ranged from a low of 0.1432 to a high of 14.933. The dose of methimazole has been adjusted as needed to treat her Graves' disease. It has been my hope that her Hashimoto's disease will eventually cancel out her Graves' disease.  3.The patient's last [facility] visit was on 02/25/12. In the interim, she continued her steroid taper and finally stopped the steroids about mid-to-late November. She is weaker than she was two months ago. She still receives her IVIG regimen on Mondays and Thursdays. She takes methimazole, 2.5 mg  daily and lisinopril 2.5 mg daily. She is also taking propranolol, ranitidine, gabapentin, iron, and vitamin D. On 05/03/12 she fell, striking the back of her head, causing a laceration. She had to go to the ED and have the laceration stapled. She is eating better. Although weak, she has been getting out more, for example, to volunteer at her church. 4. Pertinent Review of Systems: Constitutional: The patient feels "fine, but weaker". She is more fatigued. She is weaker in both her arms and legs and her stamina has declined in parallel.   Eyes: Her new contact lenses are helping. Last eye exam was in March or April of this year.  Neck: The patient has had no problems with anterior neck swelling and tenderness for a year or more.   Heart: The patient is not aware of her heart beating fast. The patient has no complaints of palpitations, irregular heart beats, chest pain, or chest pressure.   Gastrointestinal: Bowel movents seem normal. The patient has no complaints of excessive hunger, acid reflux, upset stomach, stomach aches or pains, diarrhea, or constipation.  Legs: Muscle mass and strength seem to be improved. There are few complaints of numbness, tingling, burning, or pain. No edema is noted.  Feet: There  are no obvious new foot problems. There are no complaints of numbness, tingling, burning, or pain. No edema is noted. Neurologic: She requires much more assistance with walking than she did at last visit. Sensation seems to be normal. Coordination is not as good. GYN: Her LMP was this past week. Menses have been more regular lately.   Hypoglycemia: She has hypoglycemia sometimes in the afternoons.  5. Blood glucose printout: She changes her pump site every 3-7 days. She checks BGs 0-4 times per day, usually 1 time per day. BGs varied from 230 to >400. She has had many >400 readings. She says that she does not feel depressed.   PAST MEDICAL, FAMILY, AND SOCIAL HISTORY: 1. School: The patient  finished high school. She is "taking a break" from taking courses on line.    2. Activities: She has been too weak to play the drums recently. She is due to go back to [facility] in March 2014 for her annual checkup. 3. Smoking, alcohol, or drugs: None 4. Primary Care Provider: Dr. Garnet Koyanagi at [facility].  REVIEW OF SYSTEMS: There are no other significant problems involving the patient's other body systems.  PHYSICAL EXAM: BP 101/71  Pulse 110  Wt 114 lb 3.2 oz (51.801 kg) Hemoglobin A1c is 14% today, compared with 10.7% at last visit and with 9.7% at the prior visit. Her affect is somewhat flat. Constitutional: This patient appears reasonably healthy, but weaker than at last visit. She needs the help of her mother to stand from a sitting position and to walk.  Head: The head is normocephalic. Face: The face appears normal. There are no obvious dysmorphic features. Eyes: The eyes appear to be normally formed and spaced. Gaze is conjugate. There is no obvious arcus or proptosis. Moisture appears normal. Mouth: The oropharynx and tongue appear normal. Dentition appears to be normal for age. Oral moisture is normal. Neck: The neck appears to be visibly normal. No carotid bruits are noted. The thyroid gland is 25+ grams in size. The gland is relatively globular and firmer. The lobes and isthmus are enlarged. The thyroid gland is not tender to palpation. Lungs: The lungs are clear to auscultation. Air movement is good. Heart: Heart rate and rhythm are regular. Heart sounds S1 and S2 are normal. I did not appreciate any pathologic cardiac murmurs. Abdomen: The abdomen is normal in size for the patient's age. Bowel sounds are normal. There is no obvious hepatomegaly, splenomegaly, or other mass effect.  Arms: Muscle size and bulk are low-normal for age. Hands: There is a slight tremor today. Phalangeal and metacarpophalangeal joints are normal. Palmar muscles are low-normal for age. Palmar skin is  normal. Palmar moisture is also normal. Legs: Muscles appear low-normal for age. No edema is present. Feet: Her feet are cool. Dorsalis pedal pulses are 1+ bilaterally.    Neurologic: Strength is 4-5/5 for age in the upper extremities and 3-4/5 in the right leg and 4+/5 in the left leg. Muscle tone is somewhat low. Sensation to touch is normal in both the legs and feet.    LAB DATA:  02/25/12: TSH 1.044, free T4 1.45, free T3 3.4 06/14/11: TSH was 1.633. Free T4 1.47. Free T3 was 3.1. TSI was 56 (normal less than 120).                       ASSESSMENT: 1. Type 1 diabetes mellitus: The patient's BG control is much worse as a result of her not  being as compliant as she could be. 2. Hypoglycemia: None    3. Chronic inflammatory demyelinating polyneuropathy (CIDP) and muscle weakness: She feels subjectively weaker since stopping the steroids..    4. Autoimmune thyroid disease: The patient is clinically euthyroid today or perhaps mildly hypothyroid. We'll repeat her TFTs. I hope that we will be able to stop the methimazole within this calendar year.  5. Goiter: Thyroid is larger and firmer today. 6. Autonomic neuropathy with tachycardia: The heart rate is still high, c/w poor BG control.  7. Hashimoto's thyroiditis: The patient's thyroiditis is clinically quiescent at this time. I expect that as her Hashimoto's disease progressively destroys more thyroid cells, we will be able to taper her methimazole to zero. She will eventually be permanently hypothyroid and be on Synthroid hormone replacement. 8. Fatigue and decreased stamina: These problems have deteriorated somewhat since reducing her steroid dose.   9. Thyrotoxicosis: Her diffuse thyrotoxicosis, secondary to Graves' disease, is gradually improving over time with methimazole therapy.  10. Celiac disease: Patient is back on her gluten-free diet. She is asymptomatic.  11. Emotionality: The patient is doing better.  PLAN: 1. Diagnostic: She will  have TFTs and TSI drawn today. I will also write an order for an ACTH stimulation test.  2. Therapeutic: Follow plan for 5 days. Email or fax the results to me. I can then adjust her insulin pump settings.  3. Patient education: We discussed the issue of adrenal insufficiency at length and the issue of non-compliance at even greater length. She needs to be as consistent as possible with pump site changes, BG checks, and insulin boluses.  The more consistent she can be, the fewer variables we will have to deal with.  4. Follow-up: 3 months  Level of Service: This visit lasted in excess of 60 minutes. More than 50% of the visit was devoted to counseling.  Sherrlyn Hock

## 2012-06-03 NOTE — Patient Instructions (Signed)
Follow up visit in 3 months. 

## 2012-06-08 ENCOUNTER — Telehealth: Payer: Self-pay | Admitting: "Endocrinology

## 2012-06-08 LAB — THYROID STIMULATING IMMUNOGLOBULIN: TSI: 48 % baseline (ref <140)

## 2012-06-08 NOTE — Telephone Encounter (Signed)
1. Mother called wanting to discuss the TFTs drawn on 06/03/12. The TSH was 0.133, free T4 1.05, free T3 3.2. Her TSI was 40 (normal < 140). Her previous TFTs drawn on 02/25/12 showed a TSH of 1.05, free T4 1.045, and free T3 3.4. TSI in October was 10. The shift of all 3 TFTs upward or downward together indicates a recent flare up of Hashimoto's thyroiditis.  2. I reviewed the recent TFTs which show both recent Hashimoto's disease activity and some increased Graves' Disease activity. At this point the TFTs are bouncing around too actively to make any changes to her methimazole regimen. She should continue to take 2.5 mg daily (1/2 of a 5 mg pill). We should repeat her TFTs in one month.  3. Mom understood and concurred. Sherrlyn Hock

## 2012-06-16 ENCOUNTER — Other Ambulatory Visit (HOSPITAL_COMMUNITY): Payer: Self-pay | Admitting: *Deleted

## 2012-06-17 ENCOUNTER — Encounter (HOSPITAL_COMMUNITY): Payer: BC Managed Care – PPO

## 2012-06-22 ENCOUNTER — Telehealth: Payer: Self-pay | Admitting: *Deleted

## 2012-06-22 NOTE — Telephone Encounter (Signed)
Ok to give letter

## 2012-06-22 NOTE — Telephone Encounter (Signed)
Letter complete and Lauren agree to have it mailed      KP

## 2012-06-22 NOTE — Telephone Encounter (Signed)
Pt mom is requesting a letter that states that it is beneficial for the Pt to swim for exercise due to her illnesses which have left her muscle weak.Pt is current going to aquatic center to swim but mom is trying to get issue to cover to have pool at home so they do not have to fight with get Pt to center and putting her in and out of car. Pt mom states that she is having specialist to complete a letter as well but would like to get this letter from all Pt provider if possible.Please advise

## 2012-06-24 ENCOUNTER — Encounter (HOSPITAL_COMMUNITY)
Admission: RE | Admit: 2012-06-24 | Discharge: 2012-06-24 | Disposition: A | Discharge status: Home / Self Care | Payer: BC Managed Care – PPO | Source: Ambulatory Visit | Attending: "Endocrinology | Admitting: "Endocrinology

## 2012-06-24 DIAGNOSIS — E2749 Other adrenocortical insufficiency: Secondary | ICD-10-CM | POA: Insufficient documentation

## 2012-06-24 MED ORDER — COSYNTROPIN 0.25 MG IJ SOLR
0.2500 mg | Freq: Once | INTRAMUSCULAR | Status: AC
  Administered 2012-06-24: 0.25 mg via INTRAVENOUS
  Filled 2012-06-24: qty 0.25

## 2012-06-25 LAB — ACTH: C206 ACTH: 18 pg/mL (ref 10–46)

## 2012-06-25 LAB — ACTH STIMULATION, 3 TIME POINTS
Cortisol, 30 Min: 27.1 ug/dL (ref >20)
Cortisol, 60 Min: 30.8 ug/dL (ref >20)

## 2012-06-30 ENCOUNTER — Telehealth: Payer: Self-pay | Admitting: "Endocrinology

## 2012-06-30 NOTE — Telephone Encounter (Signed)
1. Mom called to request lab results from [place] test performed on 06/24/12. 2. I returned mom's call. Ladonna has been off steroids for 6-8 weeks.  3. I gave mom the results. Ruthetta had a very normal and robust cortisol response to ACTH stimulation. Any cortisol value >20 is considered normal. Shakevia's values were 27.1 and 30.8 at +30 minutes and + 60 minutes respectively.  4. Her hypothalamic-pituitary-adrenal axis is intact. However, if she were to develop a severe infection or require major surgery within the next 4 months or so, we might have to cover her with stress dose steroids for a brief period of time.  Sherrlyn Hock

## 2012-08-19 ENCOUNTER — Other Ambulatory Visit: Payer: Self-pay | Admitting: *Deleted

## 2012-08-19 DIAGNOSIS — E063 Autoimmune thyroiditis: Secondary | ICD-10-CM

## 2012-08-24 ENCOUNTER — Telehealth: Payer: Self-pay | Admitting: *Deleted

## 2012-08-24 DIAGNOSIS — G6181 Chronic inflammatory demyelinating polyneuritis: Secondary | ICD-10-CM

## 2012-08-24 NOTE — Telephone Encounter (Signed)
Patient's mother called stating they would like to proceed with plasmapheresis.

## 2012-08-24 NOTE — Telephone Encounter (Signed)
I called her mother. They are ready to start the plasmapheresis for the CIDP. We may start this, they will come off the IVIG. I'll put orders in for the catheter placement. Once the catheter is placed, I'll put in orders for plasmapheresis.

## 2012-08-25 ENCOUNTER — Other Ambulatory Visit (HOSPITAL_COMMUNITY): Payer: Self-pay | Admitting: *Deleted

## 2012-08-25 ENCOUNTER — Other Ambulatory Visit: Payer: Self-pay | Admitting: Neurology

## 2012-08-25 NOTE — Telephone Encounter (Signed)
I called and spoke to Mother, Ander Purpura, who gave me [facility] number [phone] [facility] ritter, supervisor) re: stopping IVIG but to continue solumedrol infusions.  I LMVM for lynn re: above.   Will need to check with Dr. Jannifer Franklin about treatment and orders for pt relating to IR CVL line. MOther verbalized understanding.

## 2012-08-25 NOTE — Telephone Encounter (Signed)
I called and spoke to [facility] IR dept re: order for 2 lumen hemodialysis catheter.  She stated that this order not in her workqueue.  She said that new order (can use 77001) ancillary (4-5 choices will pop up and choose IR fluoro-guided CVL (no report).  Details to go on comment section.  Anderson Malta in [place]).   Hemodialysis at [facility] [phone] is who does plasmapheresis. Relayed to Dr. Jannifer Franklin.

## 2012-08-28 ENCOUNTER — Other Ambulatory Visit: Payer: Self-pay | Admitting: Neurology

## 2012-08-28 ENCOUNTER — Telehealth: Payer: Self-pay | Admitting: Neurology

## 2012-08-28 DIAGNOSIS — M792 Neuralgia and neuritis, unspecified: Secondary | ICD-10-CM

## 2012-08-28 DIAGNOSIS — G6181 Chronic inflammatory demyelinating polyneuritis: Secondary | ICD-10-CM

## 2012-08-28 NOTE — Telephone Encounter (Signed)
I called and spoke to Mother of pt.   Pt scheduled for IR hemodialysis catheter placement on Tues 09-01-12 at 1300 (be there 1200) [facility]SS NPO for 6 hours prior.   Pt diabetic, endocrinologist to assist with insulin pump per mom.  [phone].   Will go to Dialysis unit 6th floor (outside 6700) to PLE .  Relayed the treatment plan to mom.  She verbalized understanding.   Billie in Hemodialysis stated that order will need to be placed everytime the pt is to go for the PLE.  FYI

## 2012-08-31 ENCOUNTER — Telehealth: Payer: Self-pay | Admitting: Neurology

## 2012-08-31 ENCOUNTER — Other Ambulatory Visit: Payer: Self-pay | Admitting: "Endocrinology

## 2012-08-31 ENCOUNTER — Telehealth: Payer: Self-pay | Admitting: *Deleted

## 2012-08-31 NOTE — Telephone Encounter (Signed)
I called and talked with the mother. The patient is not to stop the CellCept while on plasmapheresis.

## 2012-08-31 NOTE — Telephone Encounter (Signed)
Patient's mother called wanting to know if patient suppose to stay on Mycophenolate or stop.

## 2012-08-31 NOTE — Telephone Encounter (Signed)
I called the mother. Mother had some questions about the plasmapheresis catheter. They were told that she could not have during the time that the catheter was in place. The patient is not to shower, but she can take a bath. They need to wrap  The area with plastic.

## 2012-08-31 NOTE — Telephone Encounter (Signed)
Mother called and LMVM about the catheter access and also continuing the cellcept.  I called and spoke to [facility], in Hemodialysis unit about the IV access.  She stated that the pt has a AV catheter placed and it is in the wrist/ arms and due to amount blood flow volume.    IV option like for IVIG not option.  Also continue the cellcept when on plasmapheresis.  Please advise.- [phone]

## 2012-09-01 ENCOUNTER — Encounter (HOSPITAL_COMMUNITY): Payer: Self-pay

## 2012-09-01 ENCOUNTER — Ambulatory Visit (HOSPITAL_COMMUNITY)
Admission: RE | Admit: 2012-09-01 | Discharge: 2012-09-01 | Disposition: A | Discharge status: Home / Self Care | Payer: BC Managed Care – PPO | Source: Ambulatory Visit | Attending: Neurology | Admitting: Neurology

## 2012-09-01 ENCOUNTER — Ambulatory Visit: Payer: BC Managed Care – PPO | Admitting: "Endocrinology

## 2012-09-01 ENCOUNTER — Non-Acute Institutional Stay (HOSPITAL_COMMUNITY)
Admission: AD | Admit: 2012-09-01 | Discharge: 2012-09-01 | Disposition: A | Discharge status: Home / Self Care | Payer: BC Managed Care – PPO | Source: Ambulatory Visit | Attending: Neurology | Admitting: Neurology

## 2012-09-01 ENCOUNTER — Other Ambulatory Visit: Payer: Self-pay | Admitting: Neurology

## 2012-09-01 VITALS — BP 121/80 | HR 108 | Temp 98.3°F | Resp 16 | Ht 64.0 in | Wt 110.0 lb

## 2012-09-01 DIAGNOSIS — M792 Neuralgia and neuritis, unspecified: Secondary | ICD-10-CM

## 2012-09-01 DIAGNOSIS — E109 Type 1 diabetes mellitus without complications: Secondary | ICD-10-CM | POA: Insufficient documentation

## 2012-09-01 DIAGNOSIS — H409 Unspecified glaucoma: Secondary | ICD-10-CM | POA: Insufficient documentation

## 2012-09-01 DIAGNOSIS — E039 Hypothyroidism, unspecified: Secondary | ICD-10-CM | POA: Insufficient documentation

## 2012-09-01 DIAGNOSIS — K900 Celiac disease: Secondary | ICD-10-CM | POA: Insufficient documentation

## 2012-09-01 DIAGNOSIS — G6181 Chronic inflammatory demyelinating polyneuritis: Secondary | ICD-10-CM

## 2012-09-01 DIAGNOSIS — M4120 Other idiopathic scoliosis, site unspecified: Secondary | ICD-10-CM | POA: Insufficient documentation

## 2012-09-01 LAB — GLUCOSE, CAPILLARY: Glucose-Capillary: 269 mg/dL — ABNORMAL HIGH (ref 70–99)

## 2012-09-01 LAB — BASIC METABOLIC PANEL
Calcium: 9.3 mg/dL (ref 8.4–10.5)
Creatinine, Ser: 0.41 mg/dL — ABNORMAL LOW (ref 0.50–1.10)
GFR calc Af Amer: >90 mL/min (ref >90)
Sodium: 132 mEq/L — ABNORMAL LOW (ref 135–145)

## 2012-09-01 LAB — APTT: aPTT: 25 seconds (ref 24–37)

## 2012-09-01 LAB — CBC
HCT: 39.5 % (ref 36.0–46.0)
Platelets: 233 10*3/uL (ref 150–400)
RBC: 4.65 MIL/uL (ref 3.87–5.11)
RDW: 13.6 % (ref 11.5–15.5)
WBC: 11.2 10*3/uL — ABNORMAL HIGH (ref 4.0–10.5)

## 2012-09-01 LAB — POCT I-STAT, CHEM 8
BUN: 21 mg/dL (ref 6–23)
Calcium, Ion: 1.2 mmol/L (ref 1.12–1.23)
Chloride: 100 mEq/L (ref 96–112)
HCT: 42 % (ref 36.0–46.0)
Potassium: 3.6 mEq/L (ref 3.5–5.1)
Sodium: 138 mEq/L (ref 135–145)

## 2012-09-01 LAB — PROTIME-INR: INR: 0.99 (ref 0.00–1.49)

## 2012-09-01 MED ORDER — ACD FORMULA A 0.73-2.45-2.2 GM/100ML VI SOLN
500.0000 mL | Status: DC
  Administered 2012-09-01: 500 mL via INTRAVENOUS

## 2012-09-01 MED ORDER — SODIUM CHLORIDE 0.9 % IV SOLN: Freq: Once | INTRAVENOUS | Status: DC

## 2012-09-01 MED ORDER — SODIUM CHLORIDE 0.9 % IV SOLN
INTRAVENOUS | Status: AC
  Administered 2012-09-01 (×2): via INTRAVENOUS_CENTRAL
  Filled 2012-09-01 (×2): qty 200

## 2012-09-01 MED ORDER — DIPHENHYDRAMINE HCL 25 MG PO CAPS: 25.0000 mg | ORAL_CAPSULE | Freq: Four times a day (QID) | ORAL | Status: DC | PRN

## 2012-09-01 MED ORDER — SODIUM CHLORIDE 0.9 % IV SOLN
4.0000 g | INTRAVENOUS | Status: DC | PRN
  Filled 2012-09-01: qty 40

## 2012-09-01 MED ORDER — CALCIUM GLUCONATE 10 % IV SOLN
2.0000 g | INTRAVENOUS | Status: DC | PRN
  Filled 2012-09-01: qty 20

## 2012-09-01 MED ORDER — SODIUM CHLORIDE 0.9 % IV SOLN
2.0000 g | INTRAVENOUS | Status: DC | PRN
  Filled 2012-09-01: qty 20

## 2012-09-01 MED ORDER — ACETAMINOPHEN 325 MG PO TABS: 650.0000 mg | ORAL_TABLET | ORAL | Status: DC | PRN

## 2012-09-01 MED ORDER — CALCIUM GLUCONATE 10 % IV SOLN: 2.0000 g | INTRAVENOUS | Status: DC | PRN

## 2012-09-01 MED ORDER — CALCIUM CARBONATE ANTACID 500 MG PO CHEW
2.0000 | CHEWABLE_TABLET | ORAL | Status: DC
  Filled 2012-09-01 (×2): qty 2

## 2012-09-01 MED ORDER — SODIUM CHLORIDE 0.9 % IV SOLN
4.0000 g | INTRAVENOUS | Status: DC | PRN
  Administered 2012-09-01: 4 g via INTRAVENOUS
  Filled 2012-09-01: qty 40

## 2012-09-01 MED ORDER — CALCIUM CARBONATE ANTACID 500 MG PO CHEW
2.0000 | CHEWABLE_TABLET | ORAL | Status: DC | PRN
  Filled 2012-09-01: qty 2

## 2012-09-01 MED ORDER — SODIUM CHLORIDE 0.9 % IV SOLN
Freq: Once | INTRAVENOUS | Status: AC
  Administered 2012-09-01: 13:00:00 via INTRAVENOUS

## 2012-09-01 MED ORDER — MIDAZOLAM HCL 2 MG/2ML IJ SOLN
INTRAMUSCULAR | Status: AC | PRN
  Administered 2012-09-01 (×2): 2 mg via INTRAVENOUS

## 2012-09-01 MED ORDER — FENTANYL CITRATE 0.05 MG/ML IJ SOLN
INTRAMUSCULAR | Status: AC
  Filled 2012-09-01: qty 4

## 2012-09-01 MED ORDER — CALCIUM CARBONATE ANTACID 500 MG PO CHEW: 2.0000 | CHEWABLE_TABLET | ORAL | Status: DC

## 2012-09-01 MED ORDER — CALCIUM CARBONATE ANTACID 500 MG PO CHEW: 2.0000 | CHEWABLE_TABLET | ORAL | Status: DC | PRN

## 2012-09-01 MED ORDER — HEPARIN SODIUM (PORCINE) 1000 UNIT/ML IJ SOLN
1000.0000 [IU] | Freq: Once | INTRAMUSCULAR | Status: AC
  Administered 2012-09-01: 1000 [IU]

## 2012-09-01 MED ORDER — FENTANYL CITRATE 0.05 MG/ML IJ SOLN
INTRAMUSCULAR | Status: AC | PRN
  Administered 2012-09-01 (×3): 50 ug via INTRAVENOUS

## 2012-09-01 MED ORDER — HEPARIN SODIUM (PORCINE) 1000 UNIT/ML IJ SOLN: 1000.0000 [IU] | Freq: Once | INTRAMUSCULAR | Status: DC

## 2012-09-01 MED ORDER — MIDAZOLAM HCL 2 MG/2ML IJ SOLN
INTRAMUSCULAR | Status: AC
  Filled 2012-09-01: qty 6

## 2012-09-01 MED ORDER — ACD FORMULA A 0.73-2.45-2.2 GM/100ML VI SOLN
500.0000 mL | Status: DC
  Filled 2012-09-01: qty 500

## 2012-09-01 MED ORDER — CEFAZOLIN SODIUM 1-5 GM-% IV SOLN
1.0000 g | Freq: Once | INTRAVENOUS | Status: AC
  Administered 2012-09-01: 1 g via INTRAVENOUS
  Filled 2012-09-01: qty 50

## 2012-09-01 MED ORDER — SODIUM CHLORIDE 0.9 % IV SOLN
Freq: Once | INTRAVENOUS | Status: DC
  Filled 2012-09-01: qty 200

## 2012-09-01 MED ORDER — HEPARIN SODIUM (PORCINE) 1000 UNIT/ML IJ SOLN
INTRAMUSCULAR | Status: AC
  Filled 2012-09-01: qty 1

## 2012-09-01 NOTE — H&P (Signed)
Agree with PA note.  Signed,  Criselda Peaches, MD Vascular & Interventional Radiologist [facility] Radiology

## 2012-09-01 NOTE — Progress Notes (Signed)
Albumin exchange complete without complications or adverse events.  Vital signs stable and pt is currently without complaint.  Pt is aware of her next appointment on Thursday, September 03, 2012 @ 8am.  Pt discharged to home.

## 2012-09-01 NOTE — ED Notes (Signed)
Pt taken up to dialysis floor via transportation department

## 2012-09-01 NOTE — H&P (Signed)
Jessica Kelley is an 22 y.o. female.   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 Diabetes type I     Dr. Karsten Ro  . Celiac disease   . Macular degeneration   . Hypothyroidism   . Wyburn-Mason syndrome   . Glaucoma(365)   . Scoliosis   . CIDP (chronic inflammatory demyelinating polyneuropathy) 2001  . Pilonidal cyst     Past Surgical History  Procedure Laterality Date  . Cataract extraction  06/25/06  . Glaucoma surgery  02/12/06  . Foot arthroplasty      left foot moved and stretched tendons   . Hip arthroplasty      right never bipopsy  . Portacath placement  2002-2004    Family History  Problem Relation Age of Onset  . Prostate cancer Maternal Grandfather   . Cancer Maternal Grandfather     prostate  . Coronary artery disease Paternal Grandfather   . Hyperlipidemia    . Diabetes Sister   . Other Sister     thyroid issues  . Thyroid disease Mother    Social History:  reports that she has never smoked. She has never used smokeless tobacco. She reports that she does not drink alcohol or use illicit drugs.  Allergies: No Known Allergies   (Not in a hospital admission)  Results for orders placed during the hospital encounter of 09/01/12 (from the past 48 hour(s))  GLUCOSE, CAPILLARY     Status: Abnormal   Collection Time    09/01/12 12:39 PM      Result Value Range   Glucose-Capillary 269 (*) 70 - 99 mg/dL   Comment 1 Documented in Chart     Comment 2 Notify RN     No results found.  Review of Systems  Constitutional: Negative for fever and chills.  Respiratory: Negative for shortness of breath.   Cardiovascular: Negative for chest pain.  Gastrointestinal: Negative for nausea and  vomiting.  Musculoskeletal: Positive for joint pain.  Neurological: Positive for weakness. Negative for headaches.    There were no vitals taken for this visit. Physical Exam  Constitutional: She is oriented to person, place, and time. She appears well-developed and well-nourished.  Cardiovascular: Normal rate, regular rhythm and normal heart sounds.   No murmur heard. Respiratory: Effort normal and breath sounds normal. She has no wheezes.  GI: Soft. Bowel sounds are normal. There is no tenderness.  Musculoskeletal: Normal range of motion.  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 No cranial nerve deficit. Coordination abnormal.  Skin: Skin is warm and dry.  Psychiatric: She has a normal mood and affect. Her behavior is normal. Judgment and thought content normal.     Assessment/Plan Chronic Inflammatory Demyelinating Polyneuropathy Need plasma pheresis today Scheduled for pheresis catheter placement in IR Pt and family aware of procedure benefits and risks and agreeable to proceed Consent signed and in chart   Randolf Sansoucie A 09/01/2012, 12:58 PM

## 2012-09-01 NOTE — Procedures (Signed)
Interventional Radiology Procedure Note  Procedure: Right IJ permcatheter placed.  Tip in upper RA and ready for use. Complications: None Recommendations: - May use immediately for pheresis - Retention suture can be removed in 14 days - Return to IR when treatments are completed for line removal - Routine line care  Signed,  Criselda Peaches, MD Vascular & Interventional Radiologist [facility] Radiology

## 2012-09-02 ENCOUNTER — Telehealth (HOSPITAL_COMMUNITY): Payer: Self-pay | Admitting: *Deleted

## 2012-09-02 LAB — GLUCOSE, CAPILLARY: Glucose-Capillary: 233 mg/dL — ABNORMAL HIGH (ref 70–99)

## 2012-09-03 ENCOUNTER — Other Ambulatory Visit: Payer: Self-pay | Admitting: Neurology

## 2012-09-03 ENCOUNTER — Non-Acute Institutional Stay (HOSPITAL_COMMUNITY)
Admission: AD | Admit: 2012-09-03 | Discharge: 2012-09-03 | Disposition: A | Discharge status: Home / Self Care | Payer: BC Managed Care – PPO | Source: Ambulatory Visit | Attending: Neurology | Admitting: Neurology

## 2012-09-03 DIAGNOSIS — G6181 Chronic inflammatory demyelinating polyneuritis: Secondary | ICD-10-CM

## 2012-09-03 LAB — POCT I-STAT, CHEM 8
Chloride: 100 mEq/L (ref 96–112)
Creatinine, Ser: 0.3 mg/dL — ABNORMAL LOW (ref 0.50–1.10)
Hemoglobin: 15.3 g/dL — ABNORMAL HIGH (ref 12.0–15.0)
Potassium: 4 mEq/L (ref 3.5–5.1)
Sodium: 139 mEq/L (ref 135–145)

## 2012-09-03 LAB — CBC
HCT: 40.4 % (ref 36.0–46.0)
Hemoglobin: 13.8 g/dL (ref 12.0–15.0)
MCHC: 34.2 g/dL (ref 30.0–36.0)
RBC: 4.67 MIL/uL (ref 3.87–5.11)
WBC: 6.1 10*3/uL (ref 4.0–10.5)

## 2012-09-03 MED ORDER — CALCIUM CARBONATE ANTACID 500 MG PO CHEW: 2.0000 | CHEWABLE_TABLET | ORAL | Status: DC | PRN

## 2012-09-03 MED ORDER — SODIUM CHLORIDE 0.9 % IV SOLN
4.0000 g | INTRAVENOUS | Status: DC | PRN
  Administered 2012-09-03: 4 g via INTRAVENOUS
  Filled 2012-09-03: qty 40

## 2012-09-03 MED ORDER — SODIUM CHLORIDE 0.9 % IV SOLN
INTRAVENOUS | Status: AC
  Administered 2012-09-03 (×2): via INTRAVENOUS_CENTRAL
  Filled 2012-09-03 (×2): qty 200

## 2012-09-03 MED ORDER — CALCIUM GLUCONATE 10 % IV SOLN: 2.0000 g | INTRAVENOUS | Status: DC | PRN

## 2012-09-03 MED ORDER — SODIUM CHLORIDE 0.9 % IV SOLN: Freq: Once | INTRAVENOUS | Status: DC

## 2012-09-03 MED ORDER — CALCIUM CARBONATE ANTACID 500 MG PO CHEW
2.0000 | CHEWABLE_TABLET | ORAL | Status: DC
  Administered 2012-09-03: 400 mg via ORAL

## 2012-09-03 MED ORDER — HEPARIN SODIUM (PORCINE) 1000 UNIT/ML IJ SOLN
1000.0000 [IU] | Freq: Once | INTRAMUSCULAR | Status: AC
  Administered 2012-09-03: 1000 [IU]

## 2012-09-03 MED ORDER — ACETAMINOPHEN 325 MG PO TABS: 650.0000 mg | ORAL_TABLET | ORAL | Status: DC | PRN

## 2012-09-03 MED ORDER — DIPHENHYDRAMINE HCL 25 MG PO CAPS: 25.0000 mg | ORAL_CAPSULE | Freq: Four times a day (QID) | ORAL | Status: DC | PRN

## 2012-09-03 MED ORDER — ACD FORMULA A 0.73-2.45-2.2 GM/100ML VI SOLN
500.0000 mL | Status: DC
  Administered 2012-09-03: 500 mL via INTRAVENOUS

## 2012-09-03 MED ORDER — SODIUM CHLORIDE 0.9 % IV SOLN
2.0000 g | INTRAVENOUS | Status: DC | PRN
  Filled 2012-09-03: qty 20

## 2012-09-03 NOTE — Progress Notes (Signed)
Pt tolerated 2nd plasmapheresis tx well today.  Next tx scheduled for Monday 4/21 at 1300. Pt is aware. Dc'd to home with mother.

## 2012-09-07 ENCOUNTER — Other Ambulatory Visit: Payer: Self-pay | Admitting: Neurology

## 2012-09-07 ENCOUNTER — Non-Acute Institutional Stay (HOSPITAL_COMMUNITY)
Admission: AD | Admit: 2012-09-07 | Discharge: 2012-09-07 | Disposition: A | Discharge status: Home / Self Care | Payer: BC Managed Care – PPO | Source: Ambulatory Visit | Attending: Neurology | Admitting: Neurology

## 2012-09-07 ENCOUNTER — Telehealth: Payer: Self-pay | Admitting: Neurology

## 2012-09-07 ENCOUNTER — Encounter: Payer: Self-pay | Admitting: Neurology

## 2012-09-07 DIAGNOSIS — G6181 Chronic inflammatory demyelinating polyneuritis: Secondary | ICD-10-CM | POA: Insufficient documentation

## 2012-09-07 LAB — POCT I-STAT, CHEM 8
BUN: 12 mg/dL (ref 6–23)
Calcium, Ion: 1.3 mmol/L — ABNORMAL HIGH (ref 1.12–1.23)
Chloride: 96 mEq/L (ref 96–112)
Creatinine, Ser: 0.5 mg/dL (ref 0.50–1.10)
Glucose, Bld: 485 mg/dL — ABNORMAL HIGH (ref 70–99)
HCT: 49 % — ABNORMAL HIGH (ref 36.0–46.0)
Potassium: 3.9 mEq/L (ref 3.5–5.1)

## 2012-09-07 MED ORDER — SODIUM CHLORIDE 0.9 % IV SOLN: 4.0000 g | INTRAVENOUS | Status: DC | PRN

## 2012-09-07 MED ORDER — CALCIUM GLUCONATE 10 % IV SOLN: 2.0000 g | Freq: Once | INTRAVENOUS | Status: DC

## 2012-09-07 MED ORDER — SODIUM CHLORIDE 0.9 % IV SOLN
Freq: Once | INTRAVENOUS | Status: DC
  Filled 2012-09-07: qty 200

## 2012-09-07 MED ORDER — HEPARIN SODIUM (PORCINE) 1000 UNIT/ML IJ SOLN: 1000.0000 [IU] | Freq: Once | INTRAMUSCULAR | Status: DC

## 2012-09-07 MED ORDER — CALCIUM GLUCONATE 10 % IV SOLN: 2.0000 g | INTRAVENOUS | Status: DC | PRN

## 2012-09-07 MED ORDER — SODIUM CHLORIDE 0.9 % IV SOLN
INTRAVENOUS | Status: AC
  Administered 2012-09-07: 15:00:00 via INTRAVENOUS_CENTRAL
  Filled 2012-09-07 (×2): qty 200

## 2012-09-07 MED ORDER — ACD FORMULA A 0.73-2.45-2.2 GM/100ML VI SOLN
500.0000 mL | Status: DC
  Administered 2012-09-07: 500 mL via INTRAVENOUS

## 2012-09-07 MED ORDER — CALCIUM CARBONATE ANTACID 500 MG PO CHEW
2.0000 | CHEWABLE_TABLET | ORAL | Status: DC | PRN
  Administered 2012-09-07: 400 mg via ORAL

## 2012-09-07 MED ORDER — ACETAMINOPHEN 325 MG PO TABS: 650.0000 mg | ORAL_TABLET | ORAL | Status: DC | PRN

## 2012-09-07 MED ORDER — DIPHENHYDRAMINE HCL 25 MG PO CAPS: 25.0000 mg | ORAL_CAPSULE | Freq: Four times a day (QID) | ORAL | Status: DC | PRN

## 2012-09-07 MED ORDER — SODIUM CHLORIDE 0.9 % IV SOLN
4.0000 g | INTRAVENOUS | Status: DC | PRN
  Administered 2012-09-07: 4 g via INTRAVENOUS
  Filled 2012-09-07: qty 40

## 2012-09-07 NOTE — Telephone Encounter (Signed)
Pt calling for PLE orders.  Pt there now for treatment.   (NOT IVIG).

## 2012-09-07 NOTE — Progress Notes (Signed)
Albumin exchange complete without adverse events.  Vital signs stable and pt is without complaint.  Pt is aware of her next appointment on Wednesday September 09, 2012

## 2012-09-07 NOTE — H&P (Signed)
Reason for visit: CIDP  Jessica Kelley is a 22 y.o. female  History of present illness:   Jessica Kelley is a 22 year old left-handed white female with a history of CIDP that was diagnosed at age 59. Over the years, the patient has been treated with steroids, and IVIG. The patient was on cyclosporine earlier, but she has more recently been placed on CellCept. The patient had been doing relatively well on Solu-Medrol, getting 500 mg IV once a week. The patient has type 1 diabetes, and it was felt that long-term steroid use was not a good option for her. The patient went off of Solu-Medrol towards and the summer of 2013. The patient has had a gradual progressive problem with walking and use of the left arm since that time to the point that she now requires assistance with walking. The patient had been ambulating independently prior to that. The patient plays the drums, and she is no longer able to do this because of the arm weakness. The patient has developed weakness in the right hand as well. The patient has chronic weakness of the right greater left leg, but the left leg strength has also failed. The patient has fallen in December 2013 he the patient has been seen by Dr. Renea Ee at the [facility]. The patient currently is on IVIG taking treatments twice a week. The patient has not responded to IVIG.   Past Medical History  Diagnosis Date  . Diabetes type I     Dr. Karsten Ro  . Celiac disease   . Macular degeneration   . Hypothyroidism   . Wyburn-Mason syndrome   . Glaucoma(365)   . Scoliosis   . CIDP (chronic inflammatory demyelinating polyneuropathy) 2001  . Pilonidal cyst     Past Surgical History  Procedure Laterality Date  . Cataract extraction  06/25/06  . Glaucoma surgery  02/12/06  . Foot arthroplasty      left foot moved and stretched tendons   . Hip arthroplasty      right never bipopsy  . Portacath placement  2002-2004    Family History  Problem Relation Age of Onset   . Prostate cancer Maternal Grandfather   . Cancer Maternal Grandfather     prostate  . Coronary artery disease Paternal Grandfather   . Hyperlipidemia    . Diabetes Sister   . Other Sister     thyroid issues  . Thyroid disease Mother     Social history:  reports that she has never smoked. She has never used smokeless tobacco. She reports that she does not drink alcohol or use illicit drugs.  Medications:  No current facility-administered medications on file prior to encounter.   Current Outpatient Prescriptions on File Prior to Encounter  Medication Sig Dispense Refill  . Cholecalciferol (VITAMIN D) 1000 UNITS capsule Take 1,000 Units by mouth daily.        . Cyanocobalamin (VITAMIN B-12 PO) Take by mouth.      . Ferrous Sulfate (RA IRON) 27 MG TABS Take by mouth 2 (two) times daily.        Marland Kitchen gabapentin (NEURONTIN) 100 MG capsule Take 100 mg by mouth 3 (three) times daily.        Marland Kitchen HYDROcodone-acetaminophen (VICODIN) 5-500 MG per tablet Take 1 tablet by mouth every 6 (six) hours as needed for pain.  5 tablet  0  . ibuprofen (ADVIL,MOTRIN) 200 MG tablet Take 600 mg by mouth every 8 (eight) hours. 2 days per week  following treatment       . immune globulin, human, (POLYGAM) 10 G injection Inject 50 mg into the vein 2 (two) times a week.       . insulin aspart (NOVOLOG) 100 UNIT/ML injection Use with insulin pump, dispense 4 vials  10 mL  6  . lisinopril (ZESTRIL) 2.5 MG tablet Take 1 tablet (2.5 mg total) by mouth daily.  30 tablet  11  . methimazole (TAPAZOLE) 5 MG tablet Take 0.5 tablets (2.5 mg total) by mouth daily.  16 tablet  3  . methylPREDNISolone (MEDROL) 2 MG tablet Inject 250 mg into the vein every 30 (thirty) days.       . Multiple Vitamin (MULTIVITAMIN) tablet Take 1 tablet by mouth daily.        . mycophenolate (CELLCEPT) 500 MG tablet Take 1,000 mg by mouth 2 (two) times daily.       Marland Kitchen PRESCRIPTION MEDICATION Gets IVIG treatment twice a week...could not verify  concentration and dose       . propranolol (INDERAL) 20 MG tablet TAKE ONE TABLET BY MOUTH THREE TIMES DAILY  90 tablet  0  . ranitidine (ZANTAC) 150 MG tablet Take 150 mg by mouth 2 (two) times daily.        . sodium chloride 0.9 % solution Inject into the vein continuous.          Allergies: No Known Allergies  ROS:  Out of a complete 14 system review of symptoms, the patient complains only of the following symptoms, and all other reviewed systems are negative.  Weakness Gait disorder  Blood pressure 104/65, pulse 88, temperature 98.2 F (36.8 C), temperature source Oral, resp. rate 14, weight 103 lb 3.2 oz (46.811 kg), last menstrual period 07/30/2012, SpO2 100.00%.   Physical Exam  General: Patient is alert and cooperative at the time of the examination. Head: Pupils are equal round and reactive to light.  Discs are flat bilaterally. Neck: Neck is supple, no carotid bruits noted. Respiratory: Respiratory examination is clear. Cardiovascular: Cardiovascular examination reveals a regular rate and rhythm, no obvious murmurs or rubs noted. Skin: Extremities are without significant edema.  Neurologic Exam  Cranial Nerves: Facial symmetry is present.  Good sensation of the face to pinprick and soft touch bilaterally.  Strength of the facial muscles and the muscles to head turning and shoulder shrug are normal bilaterally.  Speech is well enunciated, no aphasia or dysarthria is noted.  Extraocular movements are full.  Visual fields are full. Motor: With motor testing, the patient has intrinsic muscle weakness bilaterally with the hands. The patient has good grip strength with the right hand, and good proximal strength with the right arm. With the left arm, the patient has weakness with biceps and triceps testing, and weakness with external and internal rotation of the shoulder on the left. The patient has significant weakness of the left anterior deltoid muscle, without antigravity  movement.  With the lower extremities, the patient has severe weakness with the right leg, proximally and distally, without antigravity movement. The left leg is stronger, with 4/5 strength throughout.  Sensory: Sensory testing is intact to pinprick, soft touch, vibratory sensation and position sense in all 4 extremities.  No evidence of extinction is noted. Coordination: The patient has difficulty performing finger-nose-finger with the left arm, she is unable to perform on the right. The patient is able to perform heel-to-shin with the left leg with some difficulty. The patient is unable to perform with the right.  Gait and Station: The patient can only walk with assistance. The patient can bear weight on the right leg only with the knee in the full extended position. The patient has a circumduction type gait with the right leg. Gait is wide-based, quite unsteady. Romberg is unsteady, with a tendency to fall. Tandem gait was not tested.  Reflexes: Deep tendon reflexes are absent throughout, toes are neutral bilaterally.  Assessment/Plan:  1. CIDP  2. Gait disorder  The patient has severe motor deficits, and a severe gait disorder. The patient has progressed while taking steroids, IVIG, and CellCept. The patient will be given a trial on plasma pheresis to see if this helps her clinical deficits. Plasmapheresis will be initiated this time.  The patient will receive plasmapheresis every other day or 5 treatments, and then go to one treatment every week for 6 months. The patient will followup through this office in 2 or 3 months. We will follow the nerve conduction studies.  Jill Alexanders MD 09/07/2012 6:51 PM  [facility] [address] [place],  [address]  Phone [phone] Fax [phone]

## 2012-09-07 NOTE — Telephone Encounter (Addendum)
Calling for PLE orders.   I spoke to Dr. Krista Blue and orders placed.  Will need orders for each day that pt goes for her PLE infusion.   She is scheduled for Wednesday and Friday this week.   And will be going one day next week.

## 2012-09-08 ENCOUNTER — Other Ambulatory Visit: Payer: Self-pay | Admitting: Neurology

## 2012-09-08 DIAGNOSIS — G6181 Chronic inflammatory demyelinating polyneuritis: Secondary | ICD-10-CM

## 2012-09-09 ENCOUNTER — Non-Acute Institutional Stay (HOSPITAL_COMMUNITY)
Admission: AD | Admit: 2012-09-09 | Discharge: 2012-09-09 | Disposition: A | Discharge status: Home / Self Care | Payer: BC Managed Care – PPO | Source: Ambulatory Visit | Attending: Neurology | Admitting: Neurology

## 2012-09-09 DIAGNOSIS — G6181 Chronic inflammatory demyelinating polyneuritis: Secondary | ICD-10-CM

## 2012-09-09 LAB — COMPREHENSIVE METABOLIC PANEL
ALT: 8 U/L (ref 0–35)
Alkaline Phosphatase: 50 U/L (ref 39–117)
CO2: 25 mEq/L (ref 19–32)
Chloride: 101 mEq/L (ref 96–112)
GFR calc Af Amer: >90 mL/min (ref >90)
GFR calc non Af Amer: >90 mL/min (ref >90)
Glucose, Bld: 415 mg/dL — ABNORMAL HIGH (ref 70–99)
Potassium: 3.3 mEq/L — ABNORMAL LOW (ref 3.5–5.1)
Sodium: 137 mEq/L (ref 135–145)

## 2012-09-09 LAB — CBC
Hemoglobin: 12.2 g/dL (ref 12.0–15.0)
RBC: 4.16 MIL/uL (ref 3.87–5.11)

## 2012-09-09 LAB — POCT I-STAT, CHEM 8
HCT: 40 % (ref 36.0–46.0)
Hemoglobin: 13.6 g/dL (ref 12.0–15.0)
Potassium: 3.3 mEq/L — ABNORMAL LOW (ref 3.5–5.1)
Sodium: 138 mEq/L (ref 135–145)

## 2012-09-09 MED ORDER — ACETAMINOPHEN 325 MG PO TABS: 650.0000 mg | ORAL_TABLET | ORAL | Status: DC | PRN

## 2012-09-09 MED ORDER — CALCIUM CARBONATE ANTACID 500 MG PO CHEW
2.0000 | CHEWABLE_TABLET | ORAL | Status: DC | PRN
  Administered 2012-09-09: 400 mg via ORAL

## 2012-09-09 MED ORDER — ACD FORMULA A 0.73-2.45-2.2 GM/100ML VI SOLN
500.0000 mL | Status: DC
  Administered 2012-09-09: 500 mL via INTRAVENOUS

## 2012-09-09 MED ORDER — CALCIUM GLUCONATE 10 % IV SOLN: 2.0000 g | Freq: Once | INTRAVENOUS | Status: DC

## 2012-09-09 MED ORDER — SODIUM CHLORIDE 0.9 % IV SOLN
INTRAVENOUS | Status: AC
  Filled 2012-09-09 (×2): qty 200

## 2012-09-09 MED ORDER — HEPARIN SODIUM (PORCINE) 1000 UNIT/ML IJ SOLN: 1000.0000 [IU] | Freq: Once | INTRAMUSCULAR | Status: DC

## 2012-09-09 MED ORDER — SODIUM CHLORIDE 0.9 % IV SOLN
2.0000 g | INTRAVENOUS | Status: DC | PRN
  Filled 2012-09-09: qty 20

## 2012-09-09 MED ORDER — ONDANSETRON HCL 4 MG/2ML IJ SOLN
4.0000 mg | Freq: Once | INTRAMUSCULAR | Status: AC
  Administered 2012-09-09: 4 mg via INTRAVENOUS

## 2012-09-09 MED ORDER — CALCIUM GLUCONATE 10 % IV SOLN
2.0000 g | INTRAVENOUS | Status: DC | PRN
Start: 2012-09-09 — End: 2012-09-09

## 2012-09-09 MED ORDER — SODIUM CHLORIDE 0.9 % IV SOLN
Freq: Once | INTRAVENOUS | Status: DC
  Filled 2012-09-09: qty 200

## 2012-09-09 MED ORDER — DIPHENHYDRAMINE HCL 25 MG PO CAPS: 25.0000 mg | ORAL_CAPSULE | Freq: Four times a day (QID) | ORAL | Status: DC | PRN

## 2012-09-09 MED ORDER — SODIUM CHLORIDE 0.9 % IV SOLN
4.0000 g | INTRAVENOUS | Status: DC | PRN
  Administered 2012-09-09: 4 g via INTRAVENOUS
  Filled 2012-09-09: qty 40

## 2012-09-09 NOTE — Progress Notes (Signed)
Pt tolerated tx well; denies SOB, nausea and vomitting; and pain. Pt accompanied off the unit on a wheelchair to her mom waiting in the hallway.

## 2012-09-10 ENCOUNTER — Other Ambulatory Visit: Payer: Self-pay | Admitting: Neurology

## 2012-09-10 DIAGNOSIS — G6181 Chronic inflammatory demyelinating polyneuritis: Secondary | ICD-10-CM

## 2012-09-11 ENCOUNTER — Telehealth: Payer: Self-pay | Admitting: Neurology

## 2012-09-11 ENCOUNTER — Non-Acute Institutional Stay (HOSPITAL_COMMUNITY)
Admission: AD | Admit: 2012-09-11 | Discharge: 2012-09-11 | Disposition: A | Discharge status: Home / Self Care | Payer: BC Managed Care – PPO | Source: Ambulatory Visit | Attending: Neurology | Admitting: Neurology

## 2012-09-11 DIAGNOSIS — G6181 Chronic inflammatory demyelinating polyneuritis: Secondary | ICD-10-CM

## 2012-09-11 LAB — CBC
MCV: 86.3 fL (ref 78.0–100.0)
Platelets: 136 10*3/uL — ABNORMAL LOW (ref 150–400)
RDW: 13.8 % (ref 11.5–15.5)
WBC: 7.1 10*3/uL (ref 4.0–10.5)

## 2012-09-11 LAB — POCT I-STAT, CHEM 8
BUN: 10 mg/dL (ref 6–23)
Calcium, Ion: 1.27 mmol/L — ABNORMAL HIGH (ref 1.12–1.23)
HCT: 41 % (ref 36.0–46.0)
TCO2: 25 mmol/L (ref 0–100)

## 2012-09-11 MED ORDER — ACD FORMULA A 0.73-2.45-2.2 GM/100ML VI SOLN
500.0000 mL | Status: DC
  Administered 2012-09-11: 500 mL via INTRAVENOUS

## 2012-09-11 MED ORDER — SODIUM CHLORIDE 0.9 % IV SOLN
4.0000 g | Freq: Once | INTRAVENOUS | Status: AC
  Administered 2012-09-11: 4 g via INTRAVENOUS
  Filled 2012-09-11: qty 40

## 2012-09-11 MED ORDER — CALCIUM GLUCONATE 10 % IV SOLN: 2.0000 g | Freq: Once | INTRAVENOUS | Status: DC

## 2012-09-11 MED ORDER — HEPARIN SODIUM (PORCINE) 1000 UNIT/ML IJ SOLN: 1000.0000 [IU] | Freq: Once | INTRAMUSCULAR | Status: DC

## 2012-09-11 MED ORDER — SODIUM CHLORIDE 0.9 % IV SOLN
INTRAVENOUS | Status: AC
  Administered 2012-09-11 (×2): via INTRAVENOUS_CENTRAL
  Filled 2012-09-11 (×2): qty 200

## 2012-09-11 MED ORDER — SODIUM CHLORIDE 0.9 % IV SOLN: Freq: Once | INTRAVENOUS | Status: DC

## 2012-09-11 MED ORDER — DIPHENHYDRAMINE HCL 25 MG PO CAPS: 25.0000 mg | ORAL_CAPSULE | Freq: Four times a day (QID) | ORAL | Status: DC | PRN

## 2012-09-11 MED ORDER — ACETAMINOPHEN 325 MG PO TABS: 650.0000 mg | ORAL_TABLET | ORAL | Status: DC | PRN

## 2012-09-11 MED ORDER — CALCIUM CARBONATE ANTACID 500 MG PO CHEW
800.0000 mg | CHEWABLE_TABLET | Freq: Every day | ORAL | Status: DC
  Administered 2012-09-11: 800 mg via ORAL

## 2012-09-11 NOTE — Progress Notes (Signed)
Tx completed without complications; pt denies pain, SOB or dizziness. Accompanied pt on her wheelchair to the hallway where her mother was waiting for her.

## 2012-09-11 NOTE — Telephone Encounter (Signed)
I called and LM with [facility] [employer] re: questions re: authorization needed for PLE. They are to call me back

## 2012-09-15 ENCOUNTER — Non-Acute Institutional Stay (HOSPITAL_COMMUNITY)
Admission: AD | Admit: 2012-09-15 | Discharge: 2012-09-15 | Disposition: A | Discharge status: Home / Self Care | Payer: BC Managed Care – PPO | Source: Ambulatory Visit | Attending: Neurology | Admitting: Neurology

## 2012-09-15 DIAGNOSIS — E039 Hypothyroidism, unspecified: Secondary | ICD-10-CM | POA: Insufficient documentation

## 2012-09-15 DIAGNOSIS — K900 Celiac disease: Secondary | ICD-10-CM | POA: Insufficient documentation

## 2012-09-15 DIAGNOSIS — G6181 Chronic inflammatory demyelinating polyneuritis: Secondary | ICD-10-CM | POA: Insufficient documentation

## 2012-09-15 DIAGNOSIS — E109 Type 1 diabetes mellitus without complications: Secondary | ICD-10-CM | POA: Insufficient documentation

## 2012-09-15 DIAGNOSIS — M4120 Other idiopathic scoliosis, site unspecified: Secondary | ICD-10-CM | POA: Insufficient documentation

## 2012-09-15 DIAGNOSIS — H409 Unspecified glaucoma: Secondary | ICD-10-CM | POA: Insufficient documentation

## 2012-09-15 LAB — POCT I-STAT, CHEM 8
Calcium, Ion: 1.29 mmol/L — ABNORMAL HIGH (ref 1.12–1.23)
Creatinine, Ser: 0.5 mg/dL (ref 0.50–1.10)
Glucose, Bld: 390 mg/dL — ABNORMAL HIGH (ref 70–99)
HCT: 41 % (ref 36.0–46.0)
Hemoglobin: 13.9 g/dL (ref 12.0–15.0)

## 2012-09-15 MED ORDER — SODIUM CHLORIDE 0.9 % IV SOLN
INTRAVENOUS | Status: AC
  Administered 2012-09-15: 10:00:00 via INTRAVENOUS_CENTRAL
  Filled 2012-09-15 (×3): qty 200

## 2012-09-15 MED ORDER — CALCIUM CARBONATE ANTACID 500 MG PO CHEW: 2.0000 | CHEWABLE_TABLET | ORAL | Status: DC

## 2012-09-15 MED ORDER — HEPARIN SODIUM (PORCINE) 1000 UNIT/ML IJ SOLN: 1000.0000 [IU] | Freq: Once | INTRAMUSCULAR | Status: DC

## 2012-09-15 MED ORDER — ACETAMINOPHEN 325 MG PO TABS: 650.0000 mg | ORAL_TABLET | ORAL | Status: DC | PRN

## 2012-09-15 MED ORDER — ACD FORMULA A 0.73-2.45-2.2 GM/100ML VI SOLN
500.0000 mL | Status: DC
  Administered 2012-09-15: 500 mL via INTRAVENOUS

## 2012-09-15 MED ORDER — SODIUM CHLORIDE 0.9 % IV SOLN
4.0000 g | Freq: Once | INTRAVENOUS | Status: DC
  Administered 2012-09-15: 4 g via INTRAVENOUS
  Filled 2012-09-15 (×2): qty 40

## 2012-09-15 MED ORDER — CALCIUM CARBONATE ANTACID 500 MG PO CHEW
2.0000 | CHEWABLE_TABLET | ORAL | Status: DC
  Administered 2012-09-15: 400 mg via ORAL

## 2012-09-15 MED ORDER — DIPHENHYDRAMINE HCL 25 MG PO CAPS: 25.0000 mg | ORAL_CAPSULE | Freq: Four times a day (QID) | ORAL | Status: DC | PRN

## 2012-09-15 NOTE — Progress Notes (Signed)
Albumin exchange complete without adverse events.  Vital signs are stable and pt is currently without complaint.  Pt is aware of her next appointment on Tuesday, Sep 22, 2012 @ 8am.  Pt is being discharged to home.

## 2012-09-15 NOTE — Telephone Encounter (Signed)
I called again for information re: authorization for pts PLE.

## 2012-09-17 NOTE — Telephone Encounter (Signed)
I called again today [employer] and after 10 mins of being on hold, Latoya D from case management answered.  I asked re: CPT codes [phone] if pre authorization needed and she said no PA needed.  Pt has no pre- existing waiting period.

## 2012-09-22 ENCOUNTER — Non-Acute Institutional Stay (HOSPITAL_COMMUNITY)
Admission: AD | Admit: 2012-09-22 | Discharge: 2012-09-22 | Disposition: A | Discharge status: Home / Self Care | Payer: BC Managed Care – PPO | Source: Ambulatory Visit | Attending: Neurology | Admitting: Neurology

## 2012-09-22 DIAGNOSIS — E109 Type 1 diabetes mellitus without complications: Secondary | ICD-10-CM | POA: Insufficient documentation

## 2012-09-22 DIAGNOSIS — K900 Celiac disease: Secondary | ICD-10-CM | POA: Insufficient documentation

## 2012-09-22 DIAGNOSIS — E039 Hypothyroidism, unspecified: Secondary | ICD-10-CM | POA: Insufficient documentation

## 2012-09-22 DIAGNOSIS — G6181 Chronic inflammatory demyelinating polyneuritis: Secondary | ICD-10-CM | POA: Insufficient documentation

## 2012-09-22 DIAGNOSIS — M4120 Other idiopathic scoliosis, site unspecified: Secondary | ICD-10-CM | POA: Insufficient documentation

## 2012-09-22 DIAGNOSIS — H409 Unspecified glaucoma: Secondary | ICD-10-CM | POA: Insufficient documentation

## 2012-09-22 MED ORDER — HEPARIN SODIUM (PORCINE) 1000 UNIT/ML IJ SOLN: 1000.0000 [IU] | Freq: Once | INTRAMUSCULAR | Status: DC

## 2012-09-22 MED ORDER — CALCIUM CARBONATE ANTACID 500 MG PO CHEW: 2.0000 | CHEWABLE_TABLET | ORAL | Status: DC

## 2012-09-22 MED ORDER — HEPARIN SODIUM (PORCINE) 1000 UNIT/ML IJ SOLN
1000.0000 [IU] | Freq: Once | INTRAMUSCULAR | Status: AC
  Administered 2012-09-22: 1000 [IU]

## 2012-09-22 MED ORDER — ACD FORMULA A 0.73-2.45-2.2 GM/100ML VI SOLN: 500.0000 mL | Status: DC

## 2012-09-22 MED ORDER — ALBUMIN HUMAN 25 % IV SOLN
INTRAVENOUS | Status: AC
  Administered 2012-09-22: 11:00:00 via INTRAVENOUS_CENTRAL
  Filled 2012-09-22 (×3): qty 200

## 2012-09-22 MED ORDER — DIPHENHYDRAMINE HCL 25 MG PO CAPS: 25.0000 mg | ORAL_CAPSULE | Freq: Four times a day (QID) | ORAL | Status: DC | PRN

## 2012-09-22 MED ORDER — CALCIUM CARBONATE ANTACID 500 MG PO CHEW
2.0000 | CHEWABLE_TABLET | ORAL | Status: DC
Start: 2012-09-22 — End: 2012-09-22
  Administered 2012-09-22: 400 mg via ORAL

## 2012-09-22 MED ORDER — ACD FORMULA A 0.73-2.45-2.2 GM/100ML VI SOLN
500.0000 mL | Status: DC
  Administered 2012-09-22: 500 mL via INTRAVENOUS

## 2012-09-22 MED ORDER — SODIUM CHLORIDE 0.9 % IV SOLN
4.0000 g | Freq: Once | INTRAVENOUS | Status: AC
  Administered 2012-09-22: 4 g via INTRAVENOUS
  Filled 2012-09-22 (×3): qty 40

## 2012-09-22 MED ORDER — SODIUM CHLORIDE 0.9 % IV SOLN: 4.0000 g | Freq: Once | INTRAVENOUS | Status: DC

## 2012-09-22 MED ORDER — ACETAMINOPHEN 325 MG PO TABS: 650.0000 mg | ORAL_TABLET | ORAL | Status: DC | PRN

## 2012-09-22 NOTE — Progress Notes (Signed)
Albumin exchange complete without adverse events.  Vital signs stable and pt is currently without complaint.  Pt is aware of next appointment on Tuesday, Sep 29, 2012 @ 8am.  Pt is being discharged to home.

## 2012-09-23 LAB — POCT I-STAT, CHEM 8
Chloride: 96 mEq/L (ref 96–112)
Creatinine, Ser: 0.4 mg/dL — ABNORMAL LOW (ref 0.50–1.10)
Hemoglobin: 13.3 g/dL (ref 12.0–15.0)
Potassium: 3.6 mEq/L (ref 3.5–5.1)
Sodium: 138 mEq/L (ref 135–145)

## 2012-09-25 ENCOUNTER — Telehealth: Payer: Self-pay | Admitting: *Deleted

## 2012-09-25 NOTE — Telephone Encounter (Signed)
I called and I left a message. I will try calling back tomorrow morning.

## 2012-09-25 NOTE — Telephone Encounter (Signed)
Patient's mother called stating Jessica Kelley is still very weak and would like to speak with physician concerning if the plasmapheresis is a good fit for her.

## 2012-09-26 NOTE — Telephone Encounter (Signed)
I called the mother. The patient has been on plasmapheresis for one month, and she is worried that she has not seen much benefit. She is otherwise toleratiing the treatment. She is to continue treatments for now, as it may take several months to see benefit.

## 2012-09-29 ENCOUNTER — Non-Acute Institutional Stay (HOSPITAL_COMMUNITY)
Admission: AD | Admit: 2012-09-29 | Discharge: 2012-09-29 | Disposition: A | Discharge status: Home / Self Care | Payer: BC Managed Care – PPO | Source: Ambulatory Visit | Attending: Neurology | Admitting: Neurology

## 2012-09-29 DIAGNOSIS — G6181 Chronic inflammatory demyelinating polyneuritis: Secondary | ICD-10-CM | POA: Insufficient documentation

## 2012-09-29 MED ORDER — SODIUM CHLORIDE 0.9 % IV SOLN: Freq: Once | INTRAVENOUS | Status: DC

## 2012-09-29 MED ORDER — HEPARIN SODIUM (PORCINE) 1000 UNIT/ML IJ SOLN
1000.0000 [IU] | Freq: Once | INTRAMUSCULAR | Status: AC
  Administered 2012-09-29: 1000 [IU]

## 2012-09-29 MED ORDER — CALCIUM GLUCONATE 10 % IV SOLN
4.0000 g | Freq: Once | INTRAVENOUS | Status: AC
  Administered 2012-09-29: 4 g via INTRAVENOUS
  Filled 2012-09-29: qty 40

## 2012-09-29 MED ORDER — ACETAMINOPHEN 325 MG PO TABS: 650.0000 mg | ORAL_TABLET | ORAL | Status: DC | PRN

## 2012-09-29 MED ORDER — CALCIUM CARBONATE ANTACID 500 MG PO CHEW
2.0000 | CHEWABLE_TABLET | ORAL | Status: AC
  Administered 2012-09-29 (×2): 400 mg via ORAL

## 2012-09-29 MED ORDER — SODIUM CHLORIDE 0.9 % IV SOLN
INTRAVENOUS | Status: AC
  Administered 2012-09-29 (×2): via INTRAVENOUS_CENTRAL
  Filled 2012-09-29 (×2): qty 200

## 2012-09-29 MED ORDER — DIPHENHYDRAMINE HCL 25 MG PO CAPS: 25.0000 mg | ORAL_CAPSULE | Freq: Four times a day (QID) | ORAL | Status: DC | PRN

## 2012-09-29 MED ORDER — ACD FORMULA A 0.73-2.45-2.2 GM/100ML VI SOLN
500.0000 mL | Status: DC
  Administered 2012-09-29: 500 mL via INTRAVENOUS

## 2012-09-29 NOTE — Progress Notes (Signed)
Pt tolerated plasmapheresis well today. She is aware of her next tx scheduled for next Tuesday at 0800. Dc'd to home with her mom.

## 2012-09-30 ENCOUNTER — Other Ambulatory Visit: Payer: Self-pay | Admitting: *Deleted

## 2012-09-30 DIAGNOSIS — E038 Other specified hypothyroidism: Secondary | ICD-10-CM

## 2012-09-30 LAB — POCT I-STAT, CHEM 8
BUN: 20 mg/dL (ref 6–23)
Chloride: 99 mEq/L (ref 96–112)
Glucose, Bld: 380 mg/dL — ABNORMAL HIGH (ref 70–99)
HCT: 38 % (ref 36.0–46.0)
Potassium: 3.7 mEq/L (ref 3.5–5.1)

## 2012-10-06 ENCOUNTER — Non-Acute Institutional Stay (HOSPITAL_COMMUNITY)
Admission: AD | Admit: 2012-10-06 | Discharge: 2012-10-06 | Disposition: A | Discharge status: Home / Self Care | Payer: BC Managed Care – PPO | Source: Ambulatory Visit | Attending: Neurology | Admitting: Neurology

## 2012-10-06 ENCOUNTER — Other Ambulatory Visit: Payer: Self-pay | Admitting: Neurology

## 2012-10-06 DIAGNOSIS — R29898 Other symptoms and signs involving the musculoskeletal system: Secondary | ICD-10-CM | POA: Insufficient documentation

## 2012-10-06 DIAGNOSIS — M4120 Other idiopathic scoliosis, site unspecified: Secondary | ICD-10-CM | POA: Insufficient documentation

## 2012-10-06 DIAGNOSIS — G6181 Chronic inflammatory demyelinating polyneuritis: Secondary | ICD-10-CM

## 2012-10-06 DIAGNOSIS — E039 Hypothyroidism, unspecified: Secondary | ICD-10-CM | POA: Insufficient documentation

## 2012-10-06 DIAGNOSIS — H3530 Unspecified macular degeneration: Secondary | ICD-10-CM | POA: Insufficient documentation

## 2012-10-06 DIAGNOSIS — E109 Type 1 diabetes mellitus without complications: Secondary | ICD-10-CM | POA: Insufficient documentation

## 2012-10-06 DIAGNOSIS — Z79899 Other long term (current) drug therapy: Secondary | ICD-10-CM | POA: Insufficient documentation

## 2012-10-06 DIAGNOSIS — K900 Celiac disease: Secondary | ICD-10-CM | POA: Insufficient documentation

## 2012-10-06 DIAGNOSIS — Z794 Long term (current) use of insulin: Secondary | ICD-10-CM | POA: Insufficient documentation

## 2012-10-06 LAB — T4, FREE: Free T4: 1.28 ng/dL (ref 0.80–1.80)

## 2012-10-06 MED ORDER — ACD FORMULA A 0.73-2.45-2.2 GM/100ML VI SOLN
500.0000 mL | Status: DC
  Administered 2012-10-06: 500 mL via INTRAVENOUS

## 2012-10-06 MED ORDER — HEPARIN SODIUM (PORCINE) 1000 UNIT/ML IJ SOLN
1000.0000 [IU] | Freq: Once | INTRAMUSCULAR | Status: AC
  Administered 2012-10-06: 1000 [IU]

## 2012-10-06 MED ORDER — CALCIUM GLUCONATE 10 % IV SOLN: 2.0000 g | Freq: Once | INTRAVENOUS | Status: DC

## 2012-10-06 MED ORDER — SODIUM CHLORIDE 0.9 % IV SOLN: Freq: Once | INTRAVENOUS | Status: DC

## 2012-10-06 MED ORDER — ACETAMINOPHEN 325 MG PO TABS: 650.0000 mg | ORAL_TABLET | ORAL | Status: DC | PRN

## 2012-10-06 MED ORDER — SODIUM CHLORIDE 0.9 % IV SOLN
INTRAVENOUS | Status: AC
  Administered 2012-10-06 (×2): via INTRAVENOUS_CENTRAL
  Filled 2012-10-06 (×2): qty 200

## 2012-10-06 MED ORDER — CALCIUM GLUCONATE 10 % IV SOLN
4.0000 g | Freq: Once | INTRAVENOUS | Status: AC
  Administered 2012-10-06: 4 g via INTRAVENOUS
  Filled 2012-10-06: qty 40

## 2012-10-06 MED ORDER — DIPHENHYDRAMINE HCL 25 MG PO CAPS: 25.0000 mg | ORAL_CAPSULE | Freq: Four times a day (QID) | ORAL | Status: DC | PRN

## 2012-10-06 NOTE — H&P (Signed)
Reason for visit: CIDP  Jessica Kelley is a 22 y.o. female  History of present illness:   Jessica Kelley is a 22 year old left-handed white female with a history of CIDP that was diagnosed at age 16. Over the years, the patient has been treated with steroids, and IVIG. The patient was on cyclosporine earlier, but she has more recently been placed on CellCept. The patient had been doing relatively well on Solu-Medrol, getting 500 mg IV once a week. The patient has type 1 diabetes, and it was felt that long-term steroid use was not a good option for her. The patient went off of Solu-Medrol towards and the summer of 2013. The patient has had a gradual progressive problem with walking and use of the left arm since that time to the point that she now requires assistance with walking. The patient had been ambulating independently prior to that. The patient plays the drums, and she is no longer able to do this because of the arm weakness. The patient has developed weakness in the right hand as well. The patient has chronic weakness of the right greater left leg, but the left leg strength has also failed. The patient has fallen in December 2013 he the patient has been seen by Dr. Renea Ee at the [facility]. The patient currently is on IVIG taking treatments twice a week. The patient has not responded to IVIG.   Past Medical History  Diagnosis Date  . Diabetes type I     Dr. Karsten Ro  . Celiac disease   . Macular degeneration   . Hypothyroidism   . Wyburn-Mason syndrome   . Glaucoma(365)   . Scoliosis   . CIDP (chronic inflammatory demyelinating polyneuropathy) 2001  . Pilonidal cyst     Past Surgical History  Procedure Laterality Date  . Cataract extraction  06/25/06  . Glaucoma surgery  02/12/06  . Foot arthroplasty      left foot moved and stretched tendons   . Hip arthroplasty      right never bipopsy  . Portacath placement  2002-2004    Family History  Problem Relation Age of Onset   . Prostate cancer Maternal Grandfather   . Cancer Maternal Grandfather     prostate  . Coronary artery disease Paternal Grandfather   . Hyperlipidemia    . Diabetes Sister   . Other Sister     thyroid issues  . Thyroid disease Mother     Social history:  reports that she has never smoked. She has never used smokeless tobacco. She reports that she does not drink alcohol or use illicit drugs.  Medications:  No current facility-administered medications on file prior to encounter.   Current Outpatient Prescriptions on File Prior to Encounter  Medication Sig Dispense Refill  . Cholecalciferol (VITAMIN D) 1000 UNITS capsule Take 1,000 Units by mouth daily.        . Cyanocobalamin (VITAMIN B-12 PO) Take by mouth.      . Ferrous Sulfate (RA IRON) 27 MG TABS Take by mouth 2 (two) times daily.        Marland Kitchen gabapentin (NEURONTIN) 100 MG capsule Take 100 mg by mouth 3 (three) times daily.        Marland Kitchen HYDROcodone-acetaminophen (VICODIN) 5-500 MG per tablet Take 1 tablet by mouth every 6 (six) hours as needed for pain.  5 tablet  0  . ibuprofen (ADVIL,MOTRIN) 200 MG tablet Take 600 mg by mouth every 8 (eight) hours. 2 days per week  following treatment       . immune globulin, human, (POLYGAM) 10 G injection Inject 50 mg into the vein 2 (two) times a week.       . insulin aspart (NOVOLOG) 100 UNIT/ML injection Use with insulin pump, dispense 4 vials  10 mL  6  . lisinopril (ZESTRIL) 2.5 MG tablet Take 1 tablet (2.5 mg total) by mouth daily.  30 tablet  11  . methimazole (TAPAZOLE) 5 MG tablet Take 0.5 tablets (2.5 mg total) by mouth daily.  16 tablet  3  . methylPREDNISolone (MEDROL) 2 MG tablet Inject 250 mg into the vein every 30 (thirty) days.       . Multiple Vitamin (MULTIVITAMIN) tablet Take 1 tablet by mouth daily.        . mycophenolate (CELLCEPT) 500 MG tablet Take 1,000 mg by mouth 2 (two) times daily.       Marland Kitchen PRESCRIPTION MEDICATION Gets IVIG treatment twice a week...could not verify  concentration and dose       . propranolol (INDERAL) 20 MG tablet TAKE ONE TABLET BY MOUTH THREE TIMES DAILY  90 tablet  0  . ranitidine (ZANTAC) 150 MG tablet Take 150 mg by mouth 2 (two) times daily.        . sodium chloride 0.9 % solution Inject into the vein continuous.          Allergies: No Known Allergies  ROS:  Out of a complete 14 system review of symptoms, the patient complains only of the following symptoms, and all other reviewed systems are negative.  Weakness Gait disorder  Blood pressure 102/60, pulse 95, temperature 99 F (37.2 C), temperature source Oral, resp. rate 16, SpO2 100.00%.   Physical Exam  General: Patient is alert and cooperative at the time of the examination. Head: Pupils are equal round and reactive to light.  Discs are flat bilaterally. Neck: Neck is supple, no carotid bruits noted. Respiratory: Respiratory examination is clear. Cardiovascular: Cardiovascular examination reveals a regular rate and rhythm, no obvious murmurs or rubs noted. Skin: Extremities are without significant edema.  Neurologic Exam  Cranial Nerves: Facial symmetry is present.  Good sensation of the face to pinprick and soft touch bilaterally.  Strength of the facial muscles and the muscles to head turning and shoulder shrug are normal bilaterally.  Speech is well enunciated, no aphasia or dysarthria is noted.  Extraocular movements are full.  Visual fields are full. Motor: With motor testing, the patient has intrinsic muscle weakness bilaterally with the hands. The patient has good grip strength with the right hand, and good proximal strength with the right arm. With the left arm, the patient has weakness with biceps and triceps testing, and weakness with external and internal rotation of the shoulder on the left. The patient has significant weakness of the left anterior deltoid muscle, without antigravity movement.  With the lower extremities, the patient has severe weakness with  the right leg, proximally and distally, without antigravity movement. The left leg is stronger, with 4/5 strength throughout.  Sensory: Sensory testing is intact to pinprick, soft touch, vibratory sensation and position sense in all 4 extremities.  No evidence of extinction is noted. Coordination: The patient has difficulty performing finger-nose-finger with the left arm, she is unable to perform on the right. The patient is able to perform heel-to-shin with the left leg with some difficulty. The patient is unable to perform with the right. Gait and Station: The patient can only walk with assistance. The  patient can bear weight on the right leg only with the knee in the full extended position. The patient has a circumduction type gait with the right leg. Gait is wide-based, quite unsteady. Romberg is unsteady, with a tendency to fall. Tandem gait was not tested.  Reflexes: Deep tendon reflexes are absent throughout, toes are neutral bilaterally.  Assessment/Plan:  1. CIDP  2. Gait disorder  The patient has severe motor deficits, and a severe gait disorder. The patient has progressed while taking steroids, IVIG, and CellCept. The patient will be given a trial on plasma pheresis to see if this helps her clinical deficits. Plasmapheresis will be initiated this time.  The patient will receive plasmapheresis every other day or 5 treatments, and then go to one treatment every week for 6 months. The patient will followup through this office in 2 or 3 months. We will follow the nerve conduction studies.  Jill Alexanders MD 10/06/2012 1:07 PM  [facility] [address] [place], [place] [address]  Phone [phone] Fax [phone]

## 2012-10-06 NOTE — Progress Notes (Signed)
TPE completed without issue. Pt tolerated well. Discussed next treatment on Tuesday 27th at 0800. Discharged to home.

## 2012-10-07 LAB — POCT I-STAT, CHEM 8
Calcium, Ion: 1.31 mmol/L — ABNORMAL HIGH (ref 1.12–1.23)
HCT: 39 % (ref 36.0–46.0)
Hemoglobin: 13.3 g/dL (ref 12.0–15.0)
TCO2: 28 mmol/L (ref 0–100)

## 2012-10-09 ENCOUNTER — Other Ambulatory Visit: Payer: Self-pay | Admitting: "Endocrinology

## 2012-10-14 ENCOUNTER — Non-Acute Institutional Stay (HOSPITAL_COMMUNITY)
Admission: AD | Admit: 2012-10-14 | Discharge: 2012-10-14 | Disposition: A | Discharge status: Home / Self Care | Payer: BC Managed Care – PPO | Source: Ambulatory Visit | Attending: Neurology | Admitting: Neurology

## 2012-10-14 ENCOUNTER — Telehealth: Payer: Self-pay | Admitting: "Endocrinology

## 2012-10-14 DIAGNOSIS — G6181 Chronic inflammatory demyelinating polyneuritis: Secondary | ICD-10-CM | POA: Insufficient documentation

## 2012-10-14 LAB — POCT I-STAT, CHEM 8
Hemoglobin: 13.3 g/dL (ref 12.0–15.0)
Sodium: 137 mEq/L (ref 135–145)
TCO2: 28 mmol/L (ref 0–100)

## 2012-10-14 MED ORDER — HEPARIN SODIUM (PORCINE) 1000 UNIT/ML IJ SOLN
1000.0000 [IU] | Freq: Once | INTRAMUSCULAR | Status: AC
  Administered 2012-10-14: 1000 [IU]

## 2012-10-14 MED ORDER — SODIUM CHLORIDE 0.9 % IV SOLN
4.0000 g | Freq: Once | INTRAVENOUS | Status: AC
  Administered 2012-10-14: 4 g via INTRAVENOUS
  Filled 2012-10-14: qty 40

## 2012-10-14 MED ORDER — DIPHENHYDRAMINE HCL 25 MG PO CAPS: 25.0000 mg | ORAL_CAPSULE | Freq: Four times a day (QID) | ORAL | Status: DC | PRN

## 2012-10-14 MED ORDER — ACETAMINOPHEN 325 MG PO TABS: 650.0000 mg | ORAL_TABLET | ORAL | Status: DC | PRN

## 2012-10-14 MED ORDER — ACD FORMULA A 0.73-2.45-2.2 GM/100ML VI SOLN
500.0000 mL | Status: DC
Start: 2012-10-14 — End: 2012-10-14
  Administered 2012-10-14: 500 mL via INTRAVENOUS

## 2012-10-14 MED ORDER — CALCIUM CARBONATE ANTACID 500 MG PO CHEW
2.0000 | CHEWABLE_TABLET | ORAL | Status: DC
  Administered 2012-10-14: 400 mg via ORAL

## 2012-10-14 MED ORDER — SODIUM CHLORIDE 0.9 % IV SOLN
INTRAVENOUS | Status: AC
  Administered 2012-10-14 (×2): via INTRAVENOUS_CENTRAL
  Filled 2012-10-14 (×2): qty 200

## 2012-10-14 NOTE — Telephone Encounter (Signed)
I did this yesterday, after mom called.

## 2012-10-14 NOTE — Telephone Encounter (Signed)
MOTHER CALLED FOR REFILL OF PROPRANOLOL TO BE CALLED IN TO [facility] ON [address].  PATIENT HAS ONLY ONE PILL LEFT AFTER HAVING TAKEN EMERGENCY SUPPLY.  PATIENT TAKES 3 PILLS/DAY AND NEEDS REFILL CALLED IN ASAP TODAY.

## 2012-10-14 NOTE — Progress Notes (Signed)
Pt tolerated plasma exchange well today. Pt dc'd to home with mom in stable condition. Aware next tx scheduled for next Tuesday at 0800. Pt will call if needs to reschedule.

## 2012-10-14 NOTE — H&P (Signed)
Reason for visit: CIDP  Jessica Kelley is a 22 y.o. female  History of present illness:   Ms. Jessica Kelley is a 22 year old left-handed white female with a history of CIDP that was diagnosed at age 69. Over the years, the patient has been treated with steroids, and IVIG. The patient was on cyclosporine earlier, but she has more recently been placed on CellCept. The patient had been doing relatively well on Solu-Medrol, getting 500 mg IV once a week. The patient has type 1 diabetes, and it was felt that long-term steroid use was not a good option for her. The patient went off of Solu-Medrol towards and the summer of 2013. The patient has had a gradual progressive problem with walking and use of the left arm since that time to the point that she now requires assistance with walking. The patient had been ambulating independently prior to that. The patient plays the drums, and she is no longer able to do this because of the arm weakness. The patient has developed weakness in the right hand as well. The patient has chronic weakness of the right greater left leg, but the left leg strength has also failed. The patient has fallen in December 2013 he the patient has been seen by Dr. Renea Ee at the [facility]. The patient currently is on IVIG taking treatments twice a week. The patient has not responded to IVIG.   Past Medical History  Diagnosis Date  . Diabetes type I     Dr. Karsten Ro  . Celiac disease   . Macular degeneration   . Hypothyroidism   . Wyburn-Mason syndrome   . Glaucoma(365)   . Scoliosis   . CIDP (chronic inflammatory demyelinating polyneuropathy) 2001  . Pilonidal cyst     Past Surgical History  Procedure Laterality Date  . Cataract extraction  06/25/06  . Glaucoma surgery  02/12/06  . Foot arthroplasty      left foot moved and stretched tendons   . Hip arthroplasty      right never bipopsy  . Portacath placement  2002-2004    Family History  Problem Relation Age of Onset   . Prostate cancer Maternal Grandfather   . Cancer Maternal Grandfather     prostate  . Coronary artery disease Paternal Grandfather   . Hyperlipidemia    . Diabetes Sister   . Other Sister     thyroid issues  . Thyroid disease Mother     Social history:  reports that she has never smoked. She has never used smokeless tobacco. She reports that she does not drink alcohol or use illicit drugs.  Medications:  No current facility-administered medications on file prior to encounter.   Current Outpatient Prescriptions on File Prior to Encounter  Medication Sig Dispense Refill  . Cholecalciferol (VITAMIN D) 1000 UNITS capsule Take 1,000 Units by mouth daily.        . Cyanocobalamin (VITAMIN B-12 PO) Take by mouth.      . Ferrous Sulfate (RA IRON) 27 MG TABS Take by mouth 2 (two) times daily.        Marland Kitchen gabapentin (NEURONTIN) 100 MG capsule Take 100 mg by mouth 3 (three) times daily.        Marland Kitchen HYDROcodone-acetaminophen (VICODIN) 5-500 MG per tablet Take 1 tablet by mouth every 6 (six) hours as needed for pain.  5 tablet  0  . ibuprofen (ADVIL,MOTRIN) 200 MG tablet Take 600 mg by mouth every 8 (eight) hours. 2 days per week  following treatment       . immune globulin, human, (POLYGAM) 10 G injection Inject 50 mg into the vein 2 (two) times a week.       . insulin aspart (NOVOLOG) 100 UNIT/ML injection Use with insulin pump, dispense 4 vials  10 mL  6  . lisinopril (ZESTRIL) 2.5 MG tablet Take 1 tablet (2.5 mg total) by mouth daily.  30 tablet  11  . methimazole (TAPAZOLE) 5 MG tablet Take 0.5 tablets (2.5 mg total) by mouth daily.  16 tablet  3  . methylPREDNISolone (MEDROL) 2 MG tablet Inject 250 mg into the vein every 30 (thirty) days.       . Multiple Vitamin (MULTIVITAMIN) tablet Take 1 tablet by mouth daily.        . mycophenolate (CELLCEPT) 500 MG tablet Take 1,000 mg by mouth 2 (two) times daily.       Marland Kitchen PRESCRIPTION MEDICATION Gets IVIG treatment twice a week...could not verify  concentration and dose       . propranolol (INDERAL) 20 MG tablet TAKE ONE TABLET BY MOUTH THREE TIMES DAILY  90 tablet  0  . ranitidine (ZANTAC) 150 MG tablet Take 150 mg by mouth 2 (two) times daily.        . sodium chloride 0.9 % solution Inject into the vein continuous.          Allergies: No Known Allergies  ROS:  Out of a complete 14 system review of symptoms, the patient complains only of the following symptoms, and all other reviewed systems are negative.  Weakness Gait disorder  Blood pressure 106/65, pulse 65, temperature 98 F (36.7 C), temperature source Oral, resp. rate 16, weight 113 lb 4.8 oz (51.393 kg), SpO2 100.00%.   Physical Exam  General: Patient is alert and cooperative at the time of the examination. Head: Pupils are equal round and reactive to light.  Discs are flat bilaterally. Neck: Neck is supple, no carotid bruits noted. Respiratory: Respiratory examination is clear. Cardiovascular: Cardiovascular examination reveals a regular rate and rhythm, no obvious murmurs or rubs noted. Skin: Extremities are without significant edema.  Neurologic Exam  Cranial Nerves: Facial symmetry is present.  Good sensation of the face to pinprick and soft touch bilaterally.  Strength of the facial muscles and the muscles to head turning and shoulder shrug are normal bilaterally.  Speech is well enunciated, no aphasia or dysarthria is noted.  Extraocular movements are full.  Visual fields are full. Motor: With motor testing, the patient has intrinsic muscle weakness bilaterally with the hands. The patient has good grip strength with the right hand, and good proximal strength with the right arm. With the left arm, the patient has weakness with biceps and triceps testing, and weakness with external and internal rotation of the shoulder on the left. The patient has significant weakness of the left anterior deltoid muscle, without antigravity movement.  With the lower extremities, the  patient has severe weakness with the right leg, proximally and distally, without antigravity movement. The left leg is stronger, with 4/5 strength throughout.  Sensory: Sensory testing is intact to pinprick, soft touch, vibratory sensation and position sense in all 4 extremities.  No evidence of extinction is noted. Coordination: The patient has difficulty performing finger-nose-finger with the left arm, she is unable to perform on the right. The patient is able to perform heel-to-shin with the left leg with some difficulty. The patient is unable to perform with the right. Gait and Station: The  patient can only walk with assistance. The patient can bear weight on the right leg only with the knee in the full extended position. The patient has a circumduction type gait with the right leg. Gait is wide-based, quite unsteady. Romberg is unsteady, with a tendency to fall. Tandem gait was not tested.  Reflexes: Deep tendon reflexes are absent throughout, toes are neutral bilaterally.  Assessment/Plan:  1. CIDP  2. Gait disorder  The patient has severe motor deficits, and a severe gait disorder. The patient has progressed while taking steroids, IVIG, and CellCept. The patient will be given a trial on plasma pheresis to see if this helps her clinical deficits. Plasmapheresis will be initiated this time.  The patient will receive plasmapheresis every other day or 5 treatments, and then go to one treatment every week for 6 months. The patient will followup through this office in 2 or 3 months. We will follow the nerve conduction studies.  Jill Alexanders MD 10/14/2012 5:06 PM  [facility] [address] [place],  [address]  Phone [phone] Fax [phone]

## 2012-10-14 NOTE — Telephone Encounter (Signed)
1. I called the patient with the results of her thyroid tests from 10/06/12. She is more hyperthyroid. She is tired, but other wise feels well. She was taken off IVIG and is now undergoing plasmapheresis weekly. Her BGs are doing better.  2. She is taking propranolol, 20 mg, three times daily and methimazole, 1/2 of a 5 mg tablet daily. 3. It's not clear why she is more hyperthyroid. Her Graves Disease may be more active. It's also possible that she is losing some methimazole during plasmapheresis. 4. Plan: Increase the methimazole to one 5 mg pill per day. Repeat the TFTs, TSI, CMP, and CBC in 4 weeks.  5. Follow up appointment June 10th. Sherrlyn Hock

## 2012-10-20 ENCOUNTER — Non-Acute Institutional Stay (HOSPITAL_COMMUNITY)
Admission: AD | Admit: 2012-10-20 | Discharge: 2012-10-20 | Disposition: A | Discharge status: Home / Self Care | Payer: BC Managed Care – PPO | Source: Ambulatory Visit | Attending: Neurology | Admitting: Neurology

## 2012-10-20 DIAGNOSIS — G6181 Chronic inflammatory demyelinating polyneuritis: Secondary | ICD-10-CM | POA: Insufficient documentation

## 2012-10-20 MED ORDER — ACD FORMULA A 0.73-2.45-2.2 GM/100ML VI SOLN
500.0000 mL | Status: DC
  Administered 2012-10-20: 500 mL via INTRAVENOUS

## 2012-10-20 MED ORDER — SODIUM CHLORIDE 0.9 % IV SOLN
4.0000 g | INTRAVENOUS | Status: AC
  Administered 2012-10-20: 4 g via INTRAVENOUS
  Filled 2012-10-20: qty 40

## 2012-10-20 MED ORDER — SODIUM CHLORIDE 0.9 % IV SOLN
INTRAVENOUS | Status: AC
  Administered 2012-10-20 (×2): via INTRAVENOUS_CENTRAL
  Filled 2012-10-20 (×2): qty 200

## 2012-10-20 MED ORDER — HEPARIN SODIUM (PORCINE) 1000 UNIT/ML IJ SOLN
1000.0000 [IU] | Freq: Once | INTRAMUSCULAR | Status: AC
Start: 2012-10-20 — End: 2012-10-20
  Administered 2012-10-20: 1000 [IU]

## 2012-10-20 MED ORDER — CALCIUM CARBONATE ANTACID 500 MG PO CHEW: 2.0000 | CHEWABLE_TABLET | ORAL | Status: DC

## 2012-10-20 MED ORDER — DIPHENHYDRAMINE HCL 25 MG PO CAPS: 25.0000 mg | ORAL_CAPSULE | Freq: Four times a day (QID) | ORAL | Status: DC | PRN

## 2012-10-20 MED ORDER — ACETAMINOPHEN 325 MG PO TABS: 650.0000 mg | ORAL_TABLET | ORAL | Status: DC | PRN

## 2012-10-20 NOTE — Progress Notes (Signed)
TPE completed without issue. VS stable. Pt is aware of next treatment Tuesday 10th at 1pm. Discharged to home with mother

## 2012-10-20 NOTE — H&P (Signed)
Reason for visit: CIDP  Jessica Kelley is a 22 y.o. female  History of present illness:   Jessica Kelley is a 22 year old left-handed white female with a history of CIDP that was diagnosed at age 9. Over the years, the patient has been treated with steroids, and IVIG. The patient was on cyclosporine earlier, but she has more recently been placed on CellCept. The patient had been doing relatively well on Solu-Medrol, getting 500 mg IV once a week. The patient has type 1 diabetes, and it was felt that long-term steroid use was not a good option for her. The patient went off of Solu-Medrol towards and the summer of 2013. The patient has had a gradual progressive problem with walking and use of the left arm since that time to the point that she now requires assistance with walking. The patient had been ambulating independently prior to that. The patient plays the drums, and she is no longer able to do this because of the arm weakness. The patient has developed weakness in the right hand as well. The patient has chronic weakness of the right greater left leg, but the left leg strength has also failed. The patient has fallen in December 2013 he the patient has been seen by Dr. [facility] at the [facility]. The patient currently is on IVIG taking treatments twice a week. The patient has not responded to IVIG.   Past Medical History  Diagnosis Date  . Diabetes type I     Dr. Karsten Ro  . Celiac disease   . Macular degeneration   . Hypothyroidism   . Wyburn-Mason syndrome   . Glaucoma(365)   . Scoliosis   . CIDP (chronic inflammatory demyelinating polyneuropathy) 2001  . Pilonidal cyst     Past Surgical History  Procedure Laterality Date  . Cataract extraction  06/25/06  . Glaucoma surgery  02/12/06  . Foot arthroplasty      left foot moved and stretched tendons   . Hip arthroplasty      right never bipopsy  . Portacath placement  2002-2004    Family History  Problem Relation Age of Onset   . Prostate cancer Maternal Grandfather   . Cancer Maternal Grandfather     prostate  . Coronary artery disease Paternal Grandfather   . Hyperlipidemia    . Diabetes Sister   . Other Sister     thyroid issues  . Thyroid disease Mother     Social history:  reports that she has never smoked. She has never used smokeless tobacco. She reports that she does not drink alcohol or use illicit drugs.  Medications:  No current facility-administered medications on file prior to encounter.   Current Outpatient Prescriptions on File Prior to Encounter  Medication Sig Dispense Refill  . Cholecalciferol (VITAMIN D) 1000 UNITS capsule Take 1,000 Units by mouth daily.        . Cyanocobalamin (VITAMIN B-12 PO) Take by mouth.      . Ferrous Sulfate (RA IRON) 27 MG TABS Take by mouth 2 (two) times daily.        Marland Kitchen gabapentin (NEURONTIN) 100 MG capsule Take 100 mg by mouth 3 (three) times daily.        Marland Kitchen HYDROcodone-acetaminophen (VICODIN) 5-500 MG per tablet Take 1 tablet by mouth every 6 (six) hours as needed for pain.  5 tablet  0  . ibuprofen (ADVIL,MOTRIN) 200 MG tablet Take 600 mg by mouth every 8 (eight) hours. 2 days per week  following treatment       . immune globulin, human, (POLYGAM) 10 G injection Inject 50 mg into the vein 2 (two) times a week.       . insulin aspart (NOVOLOG) 100 UNIT/ML injection Use with insulin pump, dispense 4 vials  10 mL  6  . lisinopril (ZESTRIL) 2.5 MG tablet Take 1 tablet (2.5 mg total) by mouth daily.  30 tablet  11  . methimazole (TAPAZOLE) 5 MG tablet Take 0.5 tablets (2.5 mg total) by mouth daily.  16 tablet  3  . methylPREDNISolone (MEDROL) 2 MG tablet Inject 250 mg into the vein every 30 (thirty) days.       . Multiple Vitamin (MULTIVITAMIN) tablet Take 1 tablet by mouth daily.        . mycophenolate (CELLCEPT) 500 MG tablet Take 1,000 mg by mouth 2 (two) times daily.       Marland Kitchen PRESCRIPTION MEDICATION Gets IVIG treatment twice a week...could not verify  concentration and dose       . propranolol (INDERAL) 20 MG tablet TAKE ONE TABLET BY MOUTH THREE TIMES DAILY  90 tablet  0  . ranitidine (ZANTAC) 150 MG tablet Take 150 mg by mouth 2 (two) times daily.        . sodium chloride 0.9 % solution Inject into the vein continuous.          Allergies: No Known Allergies  ROS:  Out of a complete 14 system review of symptoms, the patient complains only of the following symptoms, and all other reviewed systems are negative.  Weakness Gait disorder  Blood pressure 105/63, pulse 89, temperature 98.1 F (36.7 C), temperature source Oral, resp. rate 15, weight 114 lb (51.71 kg), SpO2 100.00%.   Physical Exam  General: Patient is alert and cooperative at the time of the examination. Head: Pupils are equal round and reactive to light.  Discs are flat bilaterally. Neck: Neck is supple, no carotid bruits noted. Respiratory: Respiratory examination is clear. Cardiovascular: Cardiovascular examination reveals a regular rate and rhythm, no obvious murmurs or rubs noted. Skin: Extremities are without significant edema.  Neurologic Exam  Cranial Nerves: Facial symmetry is present.  Good sensation of the face to pinprick and soft touch bilaterally.  Strength of the facial muscles and the muscles to head turning and shoulder shrug are normal bilaterally.  Speech is well enunciated, no aphasia or dysarthria is noted.  Extraocular movements are full.  Visual fields are full. Motor: With motor testing, the patient has intrinsic muscle weakness bilaterally with the hands. The patient has good grip strength with the right hand, and good proximal strength with the right arm. With the left arm, the patient has weakness with biceps and triceps testing, and weakness with external and internal rotation of the shoulder on the left. The patient has significant weakness of the left anterior deltoid muscle, without antigravity movement.  With the lower extremities, the  patient has severe weakness with the right leg, proximally and distally, without antigravity movement. The left leg is stronger, with 4/5 strength throughout.  Sensory: Sensory testing is intact to pinprick, soft touch, vibratory sensation and position sense in all 4 extremities.  No evidence of extinction is noted. Coordination: The patient has difficulty performing finger-nose-finger with the left arm, she is unable to perform on the right. The patient is able to perform heel-to-shin with the left leg with some difficulty. The patient is unable to perform with the right. Gait and Station: The patient can  only walk with assistance. The patient can bear weight on the right leg only with the knee in the full extended position. The patient has a circumduction type gait with the right leg. Gait is wide-based, quite unsteady. Romberg is unsteady, with a tendency to fall. Tandem gait was not tested.  Reflexes: Deep tendon reflexes are absent throughout, toes are neutral bilaterally.  Assessment/Plan:  1. CIDP  2. Gait disorder  The patient has severe motor deficits, and a severe gait disorder. The patient has progressed while taking steroids, IVIG, and CellCept. The patient will be given a trial on plasma pheresis to see if this helps her clinical deficits. Plasmapheresis will be initiated this time.  The patient will receive plasmapheresis every other day or 5 treatments, and then go to one treatment every week for 6 months.   Jill Alexanders MD 10/20/2012 2:07 PM  [facility] [address] [place], [place] [address]  Phone [phone] Fax [phone]

## 2012-10-21 ENCOUNTER — Other Ambulatory Visit: Payer: Self-pay | Admitting: "Endocrinology

## 2012-10-21 LAB — POCT I-STAT, CHEM 8
Calcium, Ion: 1.27 mmol/L — ABNORMAL HIGH (ref 1.12–1.23)
Chloride: 99 mEq/L (ref 96–112)
HCT: 38 % (ref 36.0–46.0)
Sodium: 137 mEq/L (ref 135–145)
TCO2: 30 mmol/L (ref 0–100)

## 2012-10-27 ENCOUNTER — Ambulatory Visit (INDEPENDENT_AMBULATORY_CARE_PROVIDER_SITE_OTHER): Payer: BC Managed Care – PPO | Admitting: "Endocrinology

## 2012-10-27 ENCOUNTER — Encounter: Payer: Self-pay | Admitting: "Endocrinology

## 2012-10-27 ENCOUNTER — Non-Acute Institutional Stay (HOSPITAL_COMMUNITY)
Admission: AD | Admit: 2012-10-27 | Discharge: 2012-10-27 | Disposition: A | Discharge status: Home / Self Care | Payer: BC Managed Care – PPO | Source: Ambulatory Visit | Attending: Neurology | Admitting: Neurology

## 2012-10-27 VITALS — BP 100/63 | HR 114 | Wt 113.5 lb

## 2012-10-27 DIAGNOSIS — R000 Tachycardia, unspecified: Secondary | ICD-10-CM

## 2012-10-27 DIAGNOSIS — E063 Autoimmune thyroiditis: Secondary | ICD-10-CM

## 2012-10-27 DIAGNOSIS — E1169 Type 2 diabetes mellitus with other specified complication: Secondary | ICD-10-CM

## 2012-10-27 DIAGNOSIS — Z79899 Other long term (current) drug therapy: Secondary | ICD-10-CM | POA: Insufficient documentation

## 2012-10-27 DIAGNOSIS — K900 Celiac disease: Secondary | ICD-10-CM | POA: Insufficient documentation

## 2012-10-27 DIAGNOSIS — H409 Unspecified glaucoma: Secondary | ICD-10-CM | POA: Insufficient documentation

## 2012-10-27 DIAGNOSIS — E109 Type 1 diabetes mellitus without complications: Secondary | ICD-10-CM | POA: Insufficient documentation

## 2012-10-27 DIAGNOSIS — R5381 Other malaise: Secondary | ICD-10-CM

## 2012-10-27 DIAGNOSIS — E11649 Type 2 diabetes mellitus with hypoglycemia without coma: Secondary | ICD-10-CM

## 2012-10-27 DIAGNOSIS — E1043 Type 1 diabetes mellitus with diabetic autonomic (poly)neuropathy: Secondary | ICD-10-CM

## 2012-10-27 DIAGNOSIS — G909 Disorder of the autonomic nervous system, unspecified: Secondary | ICD-10-CM

## 2012-10-27 DIAGNOSIS — I498 Other specified cardiac arrhythmias: Secondary | ICD-10-CM

## 2012-10-27 DIAGNOSIS — E038 Other specified hypothyroidism: Secondary | ICD-10-CM

## 2012-10-27 DIAGNOSIS — R5383 Other fatigue: Secondary | ICD-10-CM

## 2012-10-27 DIAGNOSIS — G6181 Chronic inflammatory demyelinating polyneuritis: Secondary | ICD-10-CM

## 2012-10-27 DIAGNOSIS — E039 Hypothyroidism, unspecified: Secondary | ICD-10-CM | POA: Insufficient documentation

## 2012-10-27 DIAGNOSIS — E1065 Type 1 diabetes mellitus with hyperglycemia: Secondary | ICD-10-CM

## 2012-10-27 DIAGNOSIS — E0500 Thyrotoxicosis with diffuse goiter without thyrotoxic crisis or storm: Secondary | ICD-10-CM

## 2012-10-27 DIAGNOSIS — E1049 Type 1 diabetes mellitus with other diabetic neurological complication: Secondary | ICD-10-CM

## 2012-10-27 DIAGNOSIS — M4120 Other idiopathic scoliosis, site unspecified: Secondary | ICD-10-CM | POA: Insufficient documentation

## 2012-10-27 LAB — GLUCOSE, POCT (MANUAL RESULT ENTRY): POC Glucose: 427 mg/dl — AB (ref 70–99)

## 2012-10-27 LAB — POCT GLYCOSYLATED HEMOGLOBIN (HGB A1C): Hemoglobin A1C: 9.1

## 2012-10-27 MED ORDER — METHIMAZOLE 5 MG PO TABS: 5.0000 mg | ORAL_TABLET | ORAL | Status: DC

## 2012-10-27 MED ORDER — ACETAMINOPHEN 325 MG PO TABS: 650.0000 mg | ORAL_TABLET | ORAL | Status: DC | PRN

## 2012-10-27 MED ORDER — SODIUM CHLORIDE 0.9 % IV SOLN
INTRAVENOUS | Status: AC
  Administered 2012-10-27 (×2): via INTRAVENOUS_CENTRAL
  Filled 2012-10-27 (×2): qty 200

## 2012-10-27 MED ORDER — CALCIUM CARBONATE ANTACID 500 MG PO CHEW: 2.0000 | CHEWABLE_TABLET | ORAL | Status: DC

## 2012-10-27 MED ORDER — DIPHENHYDRAMINE HCL 25 MG PO CAPS: 25.0000 mg | ORAL_CAPSULE | Freq: Four times a day (QID) | ORAL | Status: DC | PRN

## 2012-10-27 MED ORDER — SODIUM CHLORIDE 0.9 % IV SOLN
4.0000 g | Freq: Once | INTRAVENOUS | Status: AC
  Administered 2012-10-27: 4 g via INTRAVENOUS
  Filled 2012-10-27: qty 40

## 2012-10-27 MED ORDER — ACD FORMULA A 0.73-2.45-2.2 GM/100ML VI SOLN
500.0000 mL | Status: DC
  Administered 2012-10-27: 500 mL via INTRAVENOUS

## 2012-10-27 MED ORDER — HEPARIN SODIUM (PORCINE) 1000 UNIT/ML IJ SOLN
1000.0000 [IU] | Freq: Once | INTRAMUSCULAR | Status: AC
  Administered 2012-10-27: 1000 [IU]

## 2012-10-27 NOTE — H&P (Signed)
Reason for visit: CIDP  Jessica Kelley is a 22 y.o. female  History of present illness:   Ms. Jessica Kelley is a 22 year old left-handed white female with a history of CIDP that was diagnosed at age 62. Over the years, the patient has been treated with steroids, and IVIG. The patient was on cyclosporine earlier, but she has more recently been placed on CellCept. The patient had been doing relatively well on Solu-Medrol, getting 500 mg IV once a week. The patient has type 1 diabetes, and it was felt that long-term steroid use was not a good option for her. The patient went off of Solu-Medrol towards and the summer of 2013. The patient has had a gradual progressive problem with walking and use of the left arm since that time to the point that she now requires assistance with walking. The patient had been ambulating independently prior to that. The patient plays the drums, and she is no longer able to do this because of the arm weakness. The patient has developed weakness in the right hand as well. The patient has chronic weakness of the right greater left leg, but the left leg strength has also failed. The patient has fallen in December 2013 he the patient has been seen by Dr. Renea Ee at the [facility]. The patient currently is on IVIG taking treatments twice a week. The patient has not responded to IVIG.   Past Medical History  Diagnosis Date  . Diabetes type I     Dr. Karsten Ro  . Celiac disease   . Macular degeneration   . Hypothyroidism   . Wyburn-Mason syndrome   . Glaucoma   . Scoliosis   . CIDP (chronic inflammatory demyelinating polyneuropathy) 2001  . Pilonidal cyst     Past Surgical History  Procedure Laterality Date  . Cataract extraction  06/25/06  . Glaucoma surgery  02/12/06  . Foot arthroplasty      left foot moved and stretched tendons   . Hip arthroplasty      right never bipopsy  . Portacath placement  2002-2004    Family History  Problem Relation Age of Onset  .  Prostate cancer Maternal Grandfather   . Cancer Maternal Grandfather     prostate  . Coronary artery disease Paternal Grandfather   . Hyperlipidemia    . Diabetes Sister   . Other Sister     thyroid issues  . Thyroid disease Mother     Social history:  reports that she has never smoked. She has never used smokeless tobacco. She reports that she does not drink alcohol or use illicit drugs.  Medications:  No current facility-administered medications on file prior to encounter.   Current Outpatient Prescriptions on File Prior to Encounter  Medication Sig Dispense Refill  . Cholecalciferol (VITAMIN D) 1000 UNITS capsule Take 1,000 Units by mouth daily.        . Cyanocobalamin (VITAMIN B-12 PO) Take by mouth.      . Ferrous Sulfate (RA IRON) 27 MG TABS Take by mouth 2 (two) times daily.        Marland Kitchen gabapentin (NEURONTIN) 100 MG capsule Take 100 mg by mouth 3 (three) times daily.        Marland Kitchen HYDROcodone-acetaminophen (VICODIN) 5-500 MG per tablet Take 1 tablet by mouth every 6 (six) hours as needed for pain.  5 tablet  0  . ibuprofen (ADVIL,MOTRIN) 200 MG tablet Take 600 mg by mouth every 8 (eight) hours. 2 days per week  following treatment       . immune globulin, human, (POLYGAM) 10 G injection Inject 50 mg into the vein 2 (two) times a week.       . insulin aspart (NOVOLOG) 100 UNIT/ML injection Use with insulin pump, dispense 4 vials  10 mL  6  . lisinopril (ZESTRIL) 2.5 MG tablet Take 1 tablet (2.5 mg total) by mouth daily.  30 tablet  11  . methimazole (TAPAZOLE) 5 MG tablet Take 1 tablet (5 mg total) by mouth 1 day or 1 dose.  30 tablet  6  . methylPREDNISolone (MEDROL) 2 MG tablet Inject 250 mg into the vein every 30 (thirty) days.       . Multiple Vitamin (MULTIVITAMIN) tablet Take 1 tablet by mouth daily.        . mycophenolate (CELLCEPT) 500 MG tablet Take 1,000 mg by mouth 2 (two) times daily.       Marland Kitchen PRESCRIPTION MEDICATION Gets IVIG treatment twice a week...could not verify  concentration and dose       . propranolol (INDERAL) 20 MG tablet TAKE ONE TABLET BY MOUTH THREE TIMES DAILY  90 tablet  0  . ranitidine (ZANTAC) 150 MG tablet Take 150 mg by mouth 2 (two) times daily.        . sodium chloride 0.9 % solution Inject into the vein continuous.          Allergies: No Known Allergies  ROS:  Out of a complete 14 system review of symptoms, the patient complains only of the following symptoms, and all other reviewed systems are negative.  Weakness Gait disorder  There were no vitals taken for this visit.   Physical Exam  General: Patient is alert and cooperative at the time of the examination. Head: Pupils are equal round and reactive to light.  Discs are flat bilaterally. Neck: Neck is supple, no carotid bruits noted. Respiratory: Respiratory examination is clear. Cardiovascular: Cardiovascular examination reveals a regular rate and rhythm, no obvious murmurs or rubs noted. Skin: Extremities are without significant edema.  Neurologic Exam  Cranial Nerves: Facial symmetry is present.  Good sensation of the face to pinprick and soft touch bilaterally.  Strength of the facial muscles and the muscles to head turning and shoulder shrug are normal bilaterally.  Speech is well enunciated, no aphasia or dysarthria is noted.  Extraocular movements are full.  Visual fields are full. Motor: With motor testing, the patient has intrinsic muscle weakness bilaterally with the hands. The patient has good grip strength with the right hand, and good proximal strength with the right arm. With the left arm, the patient has weakness with biceps and triceps testing, and weakness with external and internal rotation of the shoulder on the left. The patient has significant weakness of the left anterior deltoid muscle, without antigravity movement.  With the lower extremities, the patient has severe weakness with the right leg, proximally and distally, without antigravity movement. The  left leg is stronger, with 4/5 strength throughout.  Sensory: Sensory testing is intact to pinprick, soft touch, vibratory sensation and position sense in all 4 extremities.  No evidence of extinction is noted. Coordination: The patient has difficulty performing finger-nose-finger with the left arm, she is unable to perform on the right. The patient is able to perform heel-to-shin with the left leg with some difficulty. The patient is unable to perform with the right. Gait and Station: The patient can only walk with assistance. The patient can bear weight on the  right leg only with the knee in the full extended position. The patient has a circumduction type gait with the right leg. Gait is wide-based, quite unsteady. Romberg is unsteady, with a tendency to fall. Tandem gait was not tested.  Reflexes: Deep tendon reflexes are absent throughout, toes are neutral bilaterally.  Assessment/Plan:  1. CIDP  2. Gait disorder  The patient has severe motor deficits, and a severe gait disorder. The patient has progressed while taking steroids, IVIG, and CellCept. The patient will be given a trial on plasma pheresis to see if this helps her clinical deficits. Plasmapheresis will be initiated this time.  The patient will receive plasmapheresis every other day or 5 treatments, and then go to one treatment every week for 6 months.   Jill Alexanders MD 10/27/2012 2:16 PM  [facility] [address] [place], [place] [address]  Phone [phone] Fax [phone]

## 2012-10-27 NOTE — Progress Notes (Signed)
TPE complete without issue. Goal met. Pt discharged to home in stable condition. Aware of next appointment Tuesday 17th at 8am.

## 2012-10-27 NOTE — Patient Instructions (Signed)
Follow up visit in 3 months. In one month please call me during the clinic day to discuss your BGs.

## 2012-10-27 NOTE — Progress Notes (Signed)
CHIEF COMPLAINT: The patient presents for follow-up of type 1 diabetes mellitus, chronic inflammatory demyelinating polyneuropathy (CIDP), celiac disease, muscle weakness, limited endurance, goiter, thyroiditis, tachycardia, autonomic neuropathy, hypoglycemia, hyperthyroidism secondary to Graves' disease, hyperkalemia, anemia, iron deficiency, hypothyroid secondary to Hashimoto's thyroiditis, and fatigue.  HISTORY OF PRESENT ILLNESS: The patient is a 22 year-old Caucasian young woman. The patient was accompanied by her mother.  1. This very complicated 22 year-old white female patient was first referred to me on 09/25/06 by her primary care provider, Dr. Garnet Koyanagi, for evaluation and management of type I diabetes mellitus, hypoglycemia, and a myriad of other autoimmune issues.   A. CIDP: At about age 22 the child began to limp.  At age 22 she was evaluated at [facility] and diagnosed with CIDP. At the time I first met her she was under the care of Dr. Marcene Brawn of [facility] in [place]. She was on a regular regimen of intravenous treatments at home with IVIG and steroids on the days of therapy. The IVIG treatments were given monthly. She was also on cyclosporine A daily, alternating doses every other day. Because of the CIDP she had  significant problems with muscle atrophy and weakness, as well as multiple orthopedic issues. She was also being followed by Dr. Simonne Come, an orthopedist in [place]. On that first visit her legs and arms were quite weak. She found it very hard to walk or to exercise.  B. Type 1 diabetes mellitus: Her diabetes mellitus was also diagnosed at age 22, just prior to the diagnosis of CIDP. She had been started on a Medtronic Paradigm 712 insulin pump subsequently and was using Humalog lispro insulin in her pump. She had a very difficult time with blood glucose control. At the time of her steroids, her sugars tended to run very high. However, when she was  able to perform some physical activities, she tended to develop hypoglycemia. Her hemoglobin A1c on that first visit was 9.5%.  C. Celiac disease: The patient was diagnosed with celiac disease at approximately age 22. She had been on a gluten-free diet ever since.  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 She was followed both at [facility] and [facility] eye clinic.  2. Since that initial visit with me, I have followed her for the problems listed above plus other new problems that have developed over time.   A. CIDP: Her CIDP course has waxed and waned. At one point she had improved significantly, her medication doses had been reduced, she felt very much stronger, had more stamina, and was doing very well. Unfortunately, she later had a significant flare-up of CIDP and lost much of the improvement that she had gained. Since going to [facility] in the Spring of 2013, however, and starting a new and more intensified IVIG regimen, her strength had improved. She was still very weak physically and muscularly, however, and needed support to walk. As noted below, after tapering her steroids recently, her weakness had worsened.  B. Type 1 diabetes mellitus: The patient's blood sugars have varied significantly with the course of her CIDP and the treatments for that disease. Her hemoglobin A1c values have ranged from a low of 8.3 to a high of 11.5. Most of her A1c values have been in the 8.4-9.6 range.   C. Celiac disease: The patient has really done quite well over the  years. She rarely has signs or symptoms of abdominal problems. Because her antibody levels fluctuate a great deal, her celiac disease antibodies were negative in March 2012. She was mistakenly told that she did not have celiac disease and that she could resume a normal diet. As expected, resumption of a normal diet  caused a recurrence of active celiac disease. In October she saw a gastroenterologist, Dr. Gale Journey, at [facility] who told her that she did have celiac disease. She is now back on her gluten-free diet and is asymptomatic again.  D. Autoimmune thyroid disease: The patient had a goiter and a TPO antibody level of 287.2 on her first visit in May of 2008, consistent with evolving Hashimoto's Disease. She occasionally had the sensation of swelling and discomfort in her anterior neck, which was also c/w flare-ups of Hashimoto's Disease. In early 2009 she developed increased tachycardia. Lab tests performed in March 2000 by her primary care provider showed a TSH of 0.01 and a T4 of 7.7. It was felt that the increase in tachycardia was likely due to a combination of autonomic neuropathy and Hashitoxicosis.  However on follow-up laboratory tests performed in July, her TSH was 0.05, free T4 3.9, and free T3 14.2. Her TSI level done on the assay used at the time was 2.2, with normal being 1.0 or less. It was then evident that she had developed Graves' Disease in the setting of pre-existing Hashimoto's Disease.  I initiated treatment with methimazole, 10 mg twice daily and propranolol 20 mg 3 times daily. Since then we've seen significant swings of her thyroid function tests, partly due to changes in methimazole doses, but also partly due to changes in her immunosuppressant therapies. During the last 2 years her TSH values have ranged from a low of 0.1432 to a high of 14.933. The dose of methimazole has been adjusted as needed to treat her Graves' disease. It has been my hope that her Hashimoto's disease will eventually cancel out her Graves' disease.   3.The patient's last [facility] visit was on 06/03/12. In the interim, her IVIG was discontinued, but her steroids were re-instituted every Monday. She has an indwelling chest catheter and receives plasmapheresis every Tuesday.  She feels about the same in terms of strength and weakness.  Mom feels that she is somewhat stronger now. Because she was recently more hyperthyroid, I increased her methimazole to 5 mg daily. She also takes lisinopril 2.5 mg daily. She is also taking propranolol, ranitidine, gabapentin, iron, and vitamin D. She has a good appetite. Although weak, she has been getting out a lot more, for example, to volunteer at her church. Her headaches have resolved. Dr. Floyde Parkins is now the neurologist caring for her CIDP.  4. Pertinent Review of Systems: Constitutional: The patient says "I feel good, but I get tired." Her fatigue is about the same. Her strength/weakness are about the same. She is weaker in both her arms and legs and her stamina has declined in parallel.   Eyes: Her new contact lenses are helping. Last eye exam was in March or April of this year.  Neck: The patient has had no problems with anterior neck swelling and tenderness for a year or more.   Heart: The patient is not aware of her heart beating fast. The patient has no complaints of palpitations, irregular heart beats, chest pain, or chest pressure.   Gastrointestinal: Bowel movents seem normal. The patient has no complaints of excessive hunger, acid reflux, upset stomach, stomach aches or  pains, diarrhea, or constipation.  Legs: Muscle mass and strength seem to be improved. There are few complaints of numbness, tingling, burning, or pain. No edema is noted.  Feet: There are no obvious new foot problems. There are no complaints of numbness, tingling, burning, or pain. No edema is noted. Neurologic: She requires much more assistance with walking than she did at last visit. Sensation seems to be normal. Coordination is not as good. GYN: Her LMP was two weeks ago.  Menses have been more regular lately.   Hypoglycemia: She has hypoglycemia occasionally.   5. Blood glucose printout: She changes her pump site every 3-5 days. She checks BGs 1-5 times per day. She boluses 4-8 times daily. BGs varied from 64  to >400. Her average BG is 271. She has had many >400 readings. I can't see a specific effect of taking steroids or plasmapheresis on her BG levels.She says that she does not feel depressed.   PAST MEDICAL, FAMILY, AND SOCIAL HISTORY: 1. School: The patient finished high school. She will take at least one college course on line this Fall.     2. Activities: She has been too weak to play the drums recently. She has no plans to return to [facility] in the foreseeable future.  3. Smoking, alcohol, or drugs: None 4. Primary Care Provider: Dr. Garnet Koyanagi at [facility]. 5. Neurologist; Dr. Floyde Parkins  REVIEW OF SYSTEMS: There are no other significant problems involving the patient's other body systems.  PHYSICAL EXAM: BP 100/63  Pulse 114  Wt 113 lb 8 oz (51.483 kg)  BMI 19.47 kg/m2 Her affect is somewhat more upbeat. Constitutional: This patient appears reasonably healthy, but weaker than at last visit. She needs the help of her mother to stand from a sitting position and to walk.  Head: The head is normocephalic. Face: The face appears normal. There are no obvious dysmorphic features. Eyes: The eyes appear to be normally formed and spaced. Gaze is conjugate. There is no obvious arcus or proptosis. Moisture appears normal. Mouth: The oropharynx and tongue appear normal. Dentition appears to be normal for age. Oral moisture is normal. Neck: On inspection the thyroid gland is enlarged. No carotid bruits are noted. The thyroid gland is slightly smaller at about 25 grams in size. The gland is relatively globular and firmer. The left lobe and isthmus are enlarged. The thyroid gland is not tender to palpation. Lungs: The lungs are clear to auscultation. Air movement is good. Heart: Heart rate and rhythm are regular. Heart sounds S1 and S2 are normal. I did not appreciate any pathologic cardiac murmurs. Abdomen: The abdomen is normal in size for the patient's age. Bowel sounds are normal. There is no  obvious hepatomegaly, splenomegaly, or other mass effect.  Arms: Muscle size and bulk are low-normal for age. Hands: There is a slight tremor today. Phalangeal and metacarpophalangeal joints are normal. Palmar muscles are low-normal for age. Palmar skin is normal. Palmar moisture is also normal. Legs: Muscles appear low-normal for age. No edema is present. Feet: Her feet are cool. Dorsalis pedal pulses are 1+ bilaterally.    Neurologic: Strength is 4-5/5 for age in the upper extremities and 3-4/5 in the right leg and 4+/5 in the left leg. Muscle tone is somewhat low. Sensation to touch is normal in both the legs and feet.    LAB DATA: Hemoglobin A1c is 9.1% today, compared with 14% at last visit and with 10.7% at the visit prior.  10/06/12: TSH 0.088,  free T4 1.28, free T3 3.1 02/25/12: TSH 1.044, free T4 1.45, free T3 3.4 06/14/11: TSH was 1.633. Free T4 1.47. Free T3 was 3.1. TSI was 56 (normal less than 120).                       ASSESSMENT: 1. Type 1 diabetes mellitus: By HbA1c, the BG control is much improved. By BG printout the BG control is better, but she is still having a fair amount of BGs > 400. Some of these higher BGs occur when the sites are going bad. Overall she is much more compliant with BG checks and insulin boluses.  2. Hypoglycemia: Occasional, but infrequent    3. Chronic inflammatory demyelinating polyneuropathy (CIDP) and muscle weakness: She feels subjectively about the same since converting to her new regimen.   4. Autoimmune thyroid disease: The patient was hyperthyroid in May. We have increased her MTZ to 5 mg/day since then. She appears clinically euthyroid today. We'll repeat her TFTs later this month.  5. Goiter: Thyroid is smaller, but still firm today. 6. Autonomic neuropathy with tachycardia: The heart rate is still high, c/w poor BG control.  7. Hashimoto's thyroiditis: The patient's thyroiditis is clinically quiescent at this time. I expect that as her  Hashimoto's disease progressively destroys more thyroid cells, we will be able to taper her methimazole to zero. She will eventually be permanently hypothyroid and be on Synthroid hormone replacement. 8. Fatigue and decreased stamina: These problems have probably improved somewhat on her new regimen.   9. Thyrotoxicosis: Her diffuse thyrotoxicosis, secondary to Graves' disease, had been gradually improving over time. I would expect that the plasmapheresis will  gradually reduce the level of Graves Dz TSI.   10. Celiac disease: Patient is back on her gluten-free diet. She is asymptomatic.  11. Emotionality: The patient is doing better.  PLAN: 1. Diagnostic: She will have TFTs and TSI drawn later this month.  Call in about 4 weeks during clinic hours to discuss BGs.   2. Therapeutic: New basal rates as follows:   MN: 1.00 -> 1.10 7 AM: 1.00 -> 1.10 Noon: 0.950 -> 1.250 6;30 PM: 1.00-> 1.25. 3. Patient education: We discussed the issue of adrenal insufficiency at length and the issue of non-compliance at even greater length. She needs to be as consistent as possible with pump site changes, BG checks, and insulin boluses.  The more consistent she can be, the fewer variables we will have to deal with.  4. Follow-up: 3 months  Level of Service: This visit lasted in excess of 60 minutes. More than 50% of the visit was devoted to counseling.  Sherrlyn Hock

## 2012-10-29 ENCOUNTER — Telehealth: Payer: Self-pay | Admitting: Neurology

## 2012-10-30 NOTE — Telephone Encounter (Signed)
I called the mother. The patient's diabetes and Hashimoto's disease has improved on the plasmapheresis. I am not clear that the plasmapheresis would pull steroid molecules, but changing the timing of the Solu-Medrol would not hurt anything.

## 2012-10-30 NOTE — Telephone Encounter (Signed)
I called and spoke to mother, Ander Purpura.   Pt in to see Dr. Tobe Sos, endocrinology, and note her A1c numbers down, which is good.  Relating this to PLE?  Made mother think if pt is getting her steroids on Monday am , then getting PLE on Tuesday Am, would this also affect steroid treatment/effects.   Should steroid infusion be changed to after PLE?   Your thinking on this Dr. Jannifer Franklin?

## 2012-11-02 NOTE — Progress Notes (Signed)
Spoke with Pts mother today. Will reschedule plasma exchange treatment for Wednesday June 18th at 8am.

## 2012-11-04 ENCOUNTER — Non-Acute Institutional Stay (HOSPITAL_COMMUNITY)
Admission: AD | Admit: 2012-11-04 | Discharge: 2012-11-04 | Disposition: A | Discharge status: Home / Self Care | Payer: BC Managed Care – PPO | Source: Ambulatory Visit | Attending: Neurology | Admitting: Neurology

## 2012-11-04 ENCOUNTER — Telehealth: Payer: Self-pay | Admitting: Neurology

## 2012-11-04 DIAGNOSIS — H409 Unspecified glaucoma: Secondary | ICD-10-CM | POA: Insufficient documentation

## 2012-11-04 DIAGNOSIS — G6181 Chronic inflammatory demyelinating polyneuritis: Secondary | ICD-10-CM | POA: Insufficient documentation

## 2012-11-04 DIAGNOSIS — E109 Type 1 diabetes mellitus without complications: Secondary | ICD-10-CM | POA: Insufficient documentation

## 2012-11-04 DIAGNOSIS — Z79899 Other long term (current) drug therapy: Secondary | ICD-10-CM | POA: Insufficient documentation

## 2012-11-04 DIAGNOSIS — H3530 Unspecified macular degeneration: Secondary | ICD-10-CM | POA: Insufficient documentation

## 2012-11-04 DIAGNOSIS — Z9641 Presence of insulin pump (external) (internal): Secondary | ICD-10-CM | POA: Insufficient documentation

## 2012-11-04 DIAGNOSIS — M4120 Other idiopathic scoliosis, site unspecified: Secondary | ICD-10-CM | POA: Insufficient documentation

## 2012-11-04 DIAGNOSIS — R269 Unspecified abnormalities of gait and mobility: Secondary | ICD-10-CM | POA: Insufficient documentation

## 2012-11-04 DIAGNOSIS — Z9181 History of falling: Secondary | ICD-10-CM | POA: Insufficient documentation

## 2012-11-04 DIAGNOSIS — E039 Hypothyroidism, unspecified: Secondary | ICD-10-CM | POA: Insufficient documentation

## 2012-11-04 DIAGNOSIS — K900 Celiac disease: Secondary | ICD-10-CM | POA: Insufficient documentation

## 2012-11-04 MED ORDER — DIPHENHYDRAMINE HCL 25 MG PO CAPS: 25.0000 mg | ORAL_CAPSULE | Freq: Four times a day (QID) | ORAL | Status: DC | PRN

## 2012-11-04 MED ORDER — SODIUM CHLORIDE 0.9 % IV SOLN
4.0000 g | Freq: Once | INTRAVENOUS | Status: AC
  Administered 2012-11-04: 4 g via INTRAVENOUS
  Filled 2012-11-04: qty 40

## 2012-11-04 MED ORDER — HEPARIN SODIUM (PORCINE) 1000 UNIT/ML IJ SOLN
1000.0000 [IU] | Freq: Once | INTRAMUSCULAR | Status: AC
  Administered 2012-11-04: 1000 [IU]

## 2012-11-04 MED ORDER — CALCIUM CARBONATE ANTACID 500 MG PO CHEW: 2.0000 | CHEWABLE_TABLET | ORAL | Status: DC

## 2012-11-04 MED ORDER — ACD FORMULA A 0.73-2.45-2.2 GM/100ML VI SOLN
500.0000 mL | Status: DC
  Administered 2012-11-04: 500 mL via INTRAVENOUS

## 2012-11-04 MED ORDER — ACETAMINOPHEN 325 MG PO TABS: 650.0000 mg | ORAL_TABLET | ORAL | Status: DC | PRN

## 2012-11-04 MED ORDER — SODIUM CHLORIDE 0.9 % IV SOLN
INTRAVENOUS | Status: AC
  Administered 2012-11-04 (×2): via INTRAVENOUS_CENTRAL
  Filled 2012-11-04 (×2): qty 200

## 2012-11-04 NOTE — Telephone Encounter (Signed)
Message copied by Margette Fast on Wed Nov 04, 2012  7:15 PM ------      Message from: Bettye Boeck      Created: Wed Nov 04, 2012  2:46 PM      Contact: mother-Lauren                   ----- Message -----         From: Danford Bad         Sent: 11/04/2012  11:31 AM           To: [facility] Triage Pool            Pt takes blood plasma faresis (sp)every week and she wants to go to camp wants to know if she can miss the blood plasma faresis (sp) for next week ------

## 2012-11-04 NOTE — Telephone Encounter (Signed)
I called the mother and I left a message. Missing one plasmapheresis treatment likely is not going to cause any problems.

## 2012-11-04 NOTE — Progress Notes (Signed)
Pt tolerated plasmapheresis well today. Dc'd to home with mother post tx. Pt is volunteering at a camp next week and would like to skip pheresis, mom has left message with Dr Jannifer Franklin' office and will let us know the plan. Explained to pt and mom that the cath dsg would need to be changed and the cath flushed before she goes on Saturday to prevent infection and clotting issues. We have scheduled her to come to dialysis for this on Saturday at 0900 unless anything changes.

## 2012-11-04 NOTE — H&P (Signed)
Reason for visit: CIDP  Jessica Kelley is a 22 y.o. female  History of present illness:   Jessica Kelley is a 22 year old left-handed white female with a history of CIDP that was diagnosed at age 28. Over the years, the patient has been treated with steroids, and IVIG. The patient was on cyclosporine earlier, but she has more recently been placed on CellCept. The patient had been doing relatively well on Solu-Medrol, getting 500 mg IV once a week. The patient has type 1 diabetes, and it was felt that long-term steroid use was not a good option for her. The patient went off of Solu-Medrol towards and the summer of 2013. The patient has had a gradual progressive problem with walking and use of the left arm since that time to the point that she now requires assistance with walking. The patient had been ambulating independently prior to that. The patient plays the drums, and she is no longer able to do this because of the arm weakness. The patient has developed weakness in the right hand as well. The patient has chronic weakness of the right greater left leg, but the left leg strength has also failed. The patient has fallen in December 2013 he the patient has been seen by Dr. Renea Ee at the [facility]. The patient currently is on IVIG taking treatments twice a week. The patient has not responded to IVIG.   Past Medical History  Diagnosis Date  . Diabetes type I     Dr. Karsten Ro  . Celiac disease   . Macular degeneration   . Hypothyroidism   . Wyburn-Mason syndrome   . Glaucoma   . Scoliosis   . CIDP (chronic inflammatory demyelinating polyneuropathy) 2001  . Pilonidal cyst     Past Surgical History  Procedure Laterality Date  . Cataract extraction  06/25/06  . Glaucoma surgery  02/12/06  . Foot arthroplasty      left foot moved and stretched tendons   . Hip arthroplasty      right never bipopsy  . Portacath placement  2002-2004    Family History  Problem Relation Age of Onset  .  Prostate cancer Maternal Grandfather   . Cancer Maternal Grandfather     prostate  . Coronary artery disease Paternal Grandfather   . Hyperlipidemia    . Diabetes Sister   . Other Sister     thyroid issues  . Thyroid disease Mother     Social history:  reports that she has never smoked. She has never used smokeless tobacco. She reports that she does not drink alcohol or use illicit drugs.  Medications:  No current facility-administered medications on file prior to encounter.   Current Outpatient Prescriptions on File Prior to Encounter  Medication Sig Dispense Refill  . Cholecalciferol (VITAMIN D) 1000 UNITS capsule Take 1,000 Units by mouth daily.        . Cyanocobalamin (VITAMIN B-12 PO) Take by mouth.      . Ferrous Sulfate (RA IRON) 27 MG TABS Take by mouth 2 (two) times daily.        Marland Kitchen gabapentin (NEURONTIN) 100 MG capsule Take 100 mg by mouth 3 (three) times daily.        Marland Kitchen HYDROcodone-acetaminophen (VICODIN) 5-500 MG per tablet Take 1 tablet by mouth every 6 (six) hours as needed for pain.  5 tablet  0  . ibuprofen (ADVIL,MOTRIN) 200 MG tablet Take 600 mg by mouth every 8 (eight) hours. 2 days per week  following treatment       . immune globulin, human, (POLYGAM) 10 G injection Inject 50 mg into the vein 2 (two) times a week.       . insulin aspart (NOVOLOG) 100 UNIT/ML injection Use with insulin pump, dispense 4 vials  10 mL  6  . lisinopril (ZESTRIL) 2.5 MG tablet Take 1 tablet (2.5 mg total) by mouth daily.  30 tablet  11  . methimazole (TAPAZOLE) 5 MG tablet Take 1 tablet (5 mg total) by mouth 1 day or 1 dose.  30 tablet  6  . methylPREDNISolone (MEDROL) 2 MG tablet Inject 250 mg into the vein every 30 (thirty) days.       . Multiple Vitamin (MULTIVITAMIN) tablet Take 1 tablet by mouth daily.        . mycophenolate (CELLCEPT) 500 MG tablet Take 1,000 mg by mouth 2 (two) times daily.       Marland Kitchen PRESCRIPTION MEDICATION Gets IVIG treatment twice a week...could not verify  concentration and dose       . propranolol (INDERAL) 20 MG tablet TAKE ONE TABLET BY MOUTH THREE TIMES DAILY  90 tablet  0  . ranitidine (ZANTAC) 150 MG tablet Take 150 mg by mouth 2 (two) times daily.        . sodium chloride 0.9 % solution Inject into the vein continuous.          Allergies: No Known Allergies  ROS:  Out of a complete 14 system review of symptoms, the patient complains only of the following symptoms, and all other reviewed systems are negative.  Weakness Gait disorder  Blood pressure 96/55, pulse 91, temperature 97.7 F (36.5 C), temperature source Oral, resp. rate 22, weight 112 lb 1.6 oz (50.848 kg), SpO2 100.00%.   Physical Exam  General: Patient is alert and cooperative at the time of the examination. Head: Pupils are equal round and reactive to light.  Discs are flat bilaterally. Neck: Neck is supple, no carotid bruits noted. Respiratory: Respiratory examination is clear. Cardiovascular: Cardiovascular examination reveals a regular rate and rhythm, no obvious murmurs or rubs noted. Skin: Extremities are without significant edema.  Neurologic Exam  Cranial Nerves: Facial symmetry is present.  Good sensation of the face to pinprick and soft touch bilaterally.  Strength of the facial muscles and the muscles to head turning and shoulder shrug are normal bilaterally.  Speech is well enunciated, no aphasia or dysarthria is noted.  Extraocular movements are full.  Visual fields are full. Motor: With motor testing, the patient has intrinsic muscle weakness bilaterally with the hands. The patient has good grip strength with the right hand, and good proximal strength with the right arm. With the left arm, the patient has weakness with biceps and triceps testing, and weakness with external and internal rotation of the shoulder on the left. The patient has significant weakness of the left anterior deltoid muscle, without antigravity movement.  With the lower extremities,  the patient has severe weakness with the right leg, proximally and distally, without antigravity movement. The left leg is stronger, with 4/5 strength throughout.  Sensory: Sensory testing is intact to pinprick, soft touch, vibratory sensation and position sense in all 4 extremities.  No evidence of extinction is noted. Coordination: The patient has difficulty performing finger-nose-finger with the left arm, she is unable to perform on the right. The patient is able to perform heel-to-shin with the left leg with some difficulty. The patient is unable to perform with the right.  Gait and Station: The patient can only walk with assistance. The patient can bear weight on the right leg only with the knee in the full extended position. The patient has a circumduction type gait with the right leg. Gait is wide-based, quite unsteady. Romberg is unsteady, with a tendency to fall. Tandem gait was not tested.  Reflexes: Deep tendon reflexes are absent throughout, toes are neutral bilaterally.  Assessment/Plan:  1. CIDP  2. Gait disorder  The patient has severe motor deficits, and a severe gait disorder. The patient has progressed while taking steroids, IVIG, and CellCept. The patient will be given a trial on plasma pheresis to see if this helps her clinical deficits. Plasmapheresis will be initiated this time.  The patient will receive plasmapheresis every other day or 5 treatments, and then go to one treatment every week for 6 months.   Jill Alexanders MD 11/04/2012 11:48 AM  [facility] [address] [place], [place] [address]  Phone [phone] Fax [phone]

## 2012-11-05 LAB — POCT I-STAT, CHEM 8
BUN: 11 mg/dL (ref 6–23)
Calcium, Ion: 1.24 mmol/L — ABNORMAL HIGH (ref 1.12–1.23)
Creatinine, Ser: 0.4 mg/dL — ABNORMAL LOW (ref 0.50–1.10)
Glucose, Bld: 267 mg/dL — ABNORMAL HIGH (ref 70–99)
Hemoglobin: 12.9 g/dL (ref 12.0–15.0)
Sodium: 136 mEq/L (ref 135–145)
TCO2: 30 mmol/L (ref 0–100)

## 2012-11-09 DIAGNOSIS — G4700 Insomnia, unspecified: Secondary | ICD-10-CM

## 2012-11-09 DIAGNOSIS — G43009 Migraine without aura, not intractable, without status migrainosus: Secondary | ICD-10-CM

## 2012-11-09 DIAGNOSIS — G44229 Chronic tension-type headache, not intractable: Secondary | ICD-10-CM

## 2012-11-09 DIAGNOSIS — E063 Autoimmune thyroiditis: Secondary | ICD-10-CM

## 2012-11-09 DIAGNOSIS — M4150 Other secondary scoliosis, site unspecified: Secondary | ICD-10-CM

## 2012-11-09 DIAGNOSIS — Z79899 Other long term (current) drug therapy: Secondary | ICD-10-CM

## 2012-11-09 DIAGNOSIS — M4140 Neuromuscular scoliosis, site unspecified: Secondary | ICD-10-CM | POA: Insufficient documentation

## 2012-11-09 DIAGNOSIS — R269 Unspecified abnormalities of gait and mobility: Secondary | ICD-10-CM | POA: Insufficient documentation

## 2012-11-09 HISTORY — DX: Chronic tension-type headache, not intractable: G44.229

## 2012-11-09 HISTORY — DX: Migraine without aura, not intractable, without status migrainosus: G43.009

## 2012-11-09 HISTORY — DX: Insomnia, unspecified: G47.00

## 2012-11-10 LAB — POCT I-STAT, CHEM 8
Calcium, Ion: 1.31 mmol/L (ref 1.12–1.32)
Creatinine, Ser: 0.5 mg/dL (ref 0.50–1.10)
Glucose, Bld: 462 mg/dL — ABNORMAL HIGH (ref 70–99)
HCT: 40 % (ref 36.0–46.0)
Hemoglobin: 13.6 g/dL (ref 12.0–15.0)
TCO2: 28 mmol/L (ref 0–100)

## 2012-11-16 ENCOUNTER — Non-Acute Institutional Stay (HOSPITAL_COMMUNITY)
Admission: RE | Admit: 2012-11-16 | Discharge: 2012-11-16 | Disposition: A | Discharge status: Home / Self Care | Payer: BC Managed Care – PPO | Source: Ambulatory Visit | Attending: Neurology | Admitting: Neurology

## 2012-11-16 DIAGNOSIS — R269 Unspecified abnormalities of gait and mobility: Secondary | ICD-10-CM | POA: Insufficient documentation

## 2012-11-16 DIAGNOSIS — M4120 Other idiopathic scoliosis, site unspecified: Secondary | ICD-10-CM | POA: Insufficient documentation

## 2012-11-16 DIAGNOSIS — Z79899 Other long term (current) drug therapy: Secondary | ICD-10-CM | POA: Insufficient documentation

## 2012-11-16 DIAGNOSIS — Z9641 Presence of insulin pump (external) (internal): Secondary | ICD-10-CM | POA: Insufficient documentation

## 2012-11-16 DIAGNOSIS — E039 Hypothyroidism, unspecified: Secondary | ICD-10-CM | POA: Insufficient documentation

## 2012-11-16 DIAGNOSIS — E109 Type 1 diabetes mellitus without complications: Secondary | ICD-10-CM | POA: Insufficient documentation

## 2012-11-16 DIAGNOSIS — H3530 Unspecified macular degeneration: Secondary | ICD-10-CM | POA: Insufficient documentation

## 2012-11-16 DIAGNOSIS — K900 Celiac disease: Secondary | ICD-10-CM | POA: Insufficient documentation

## 2012-11-16 DIAGNOSIS — G6181 Chronic inflammatory demyelinating polyneuritis: Secondary | ICD-10-CM | POA: Insufficient documentation

## 2012-11-16 DIAGNOSIS — Z9181 History of falling: Secondary | ICD-10-CM | POA: Insufficient documentation

## 2012-11-16 LAB — POCT I-STAT, CHEM 8
Calcium, Ion: 1.27 mmol/L — ABNORMAL HIGH (ref 1.12–1.23)
Chloride: 99 mEq/L (ref 96–112)
Glucose, Bld: 361 mg/dL — ABNORMAL HIGH (ref 70–99)
HCT: 41 % (ref 36.0–46.0)
TCO2: 27 mmol/L (ref 0–100)

## 2012-11-16 MED ORDER — CALCIUM CARBONATE ANTACID 500 MG PO CHEW
2.0000 | CHEWABLE_TABLET | ORAL | Status: DC
  Administered 2012-11-16: 400 mg via ORAL

## 2012-11-16 MED ORDER — SODIUM CHLORIDE 0.9 % IV SOLN
INTRAVENOUS | Status: AC
  Administered 2012-11-16: 10:00:00 via INTRAVENOUS_CENTRAL
  Filled 2012-11-16 (×3): qty 200

## 2012-11-16 MED ORDER — ACD FORMULA A 0.73-2.45-2.2 GM/100ML VI SOLN
500.0000 mL | Status: DC
  Administered 2012-11-16: 500 mL via INTRAVENOUS

## 2012-11-16 MED ORDER — DIPHENHYDRAMINE HCL 25 MG PO CAPS: 25.0000 mg | ORAL_CAPSULE | Freq: Four times a day (QID) | ORAL | Status: DC | PRN

## 2012-11-16 MED ORDER — HEPARIN SODIUM (PORCINE) 1000 UNIT/ML IJ SOLN: 1000.0000 [IU] | Freq: Once | INTRAMUSCULAR | Status: DC

## 2012-11-16 MED ORDER — SODIUM CHLORIDE 0.9 % IV SOLN
4.0000 g | Freq: Once | INTRAVENOUS | Status: AC
  Administered 2012-11-16: 4 g via INTRAVENOUS
  Filled 2012-11-16: qty 40

## 2012-11-16 MED ORDER — ACETAMINOPHEN 325 MG PO TABS: 650.0000 mg | ORAL_TABLET | ORAL | Status: DC | PRN

## 2012-11-16 NOTE — Progress Notes (Signed)
Tx completed without complications; pt tolerated tx well denies SOB, dizziness paresthesia, pain  and n/v. Pt left the unit on a wheelchair to her waiting mother in the hallway.

## 2012-11-16 NOTE — H&P (Signed)
Reason for visit: CIDP  Jessica Kelley is a 22 y.o. female  History of present illness:   Jessica Kelley is a 22 year old left-handed white female with a history of CIDP that was diagnosed at age 76. Over the years, the patient has been treated with steroids, and IVIG. The patient was on cyclosporine earlier, but she has more recently been placed on CellCept. The patient had been doing relatively well on Solu-Medrol, getting 500 mg IV once a week. The patient has type 1 diabetes, and it was felt that long-term steroid use was not a good option for her. The patient went off of Solu-Medrol towards and the summer of 2013. The patient has had a gradual progressive problem with walking and use of the left arm since that time to the point that she now requires assistance with walking. The patient had been ambulating independently prior to that. The patient plays the drums, and she is no longer able to do this because of the arm weakness. The patient has developed weakness in the right hand as well. The patient has chronic weakness of the right greater left leg, but the left leg strength has also failed. The patient has fallen in December 2013 he the patient has been seen by Dr. Renea Ee at the [facility]. The patient currently is on IVIG taking treatments twice a week. The patient has not responded to IVIG.   Past Medical History  Diagnosis Date  . Diabetes type I     Dr. Karsten Ro  . Celiac disease   . Macular degeneration   . Hypothyroidism   . Wyburn-Mason syndrome   . Glaucoma   . Scoliosis   . CIDP (chronic inflammatory demyelinating polyneuropathy) 2001  . Pilonidal cyst     Past Surgical History  Procedure Laterality Date  . Cataract extraction  06/25/06  . Glaucoma surgery  02/12/06  . Foot arthroplasty      left foot moved and stretched tendons   . Hip arthroplasty      right never bipopsy  . Portacath placement  2002-2004  . Removal of vascular port    . Sciatic nerve muscle  biopsy      Family History  Problem Relation Age of Onset  . Prostate cancer Maternal Grandfather   . Cancer Maternal Grandfather     prostate  . Coronary artery disease Paternal Grandfather   . Hyperlipidemia    . Diabetes Sister   . Other Sister     thyroid issues  . Thyroid disease Mother     Social history:  reports that she has never smoked. She has never used smokeless tobacco. She reports that she does not drink alcohol or use illicit drugs.  Medications:  No current facility-administered medications on file prior to encounter.   Current Outpatient Prescriptions on File Prior to Encounter  Medication Sig Dispense Refill  . Cholecalciferol (VITAMIN D) 1000 UNITS capsule Take 1,000 Units by mouth daily.        . Cyanocobalamin (VITAMIN B-12 PO) Take by mouth.      . Ferrous Sulfate (RA IRON) 27 MG TABS Take by mouth 2 (two) times daily.        Marland Kitchen gabapentin (NEURONTIN) 100 MG capsule Take 100 mg by mouth 3 (three) times daily.        Marland Kitchen HYDROcodone-acetaminophen (VICODIN) 5-500 MG per tablet Take 1 tablet by mouth every 6 (six) hours as needed for pain.  5 tablet  0  . ibuprofen (ADVIL,MOTRIN)  200 MG tablet Take 600 mg by mouth every 8 (eight) hours. 2 days per week following treatment       . immune globulin, human, (POLYGAM) 10 G injection Inject 50 mg into the vein 2 (two) times a week.       . insulin aspart (NOVOLOG) 100 UNIT/ML injection Use with insulin pump, dispense 4 vials  10 mL  6  . lisinopril (ZESTRIL) 2.5 MG tablet Take 1 tablet (2.5 mg total) by mouth daily.  30 tablet  11  . methimazole (TAPAZOLE) 5 MG tablet Take 1 tablet (5 mg total) by mouth 1 day or 1 dose.  30 tablet  6  . methylPREDNISolone (MEDROL) 2 MG tablet Inject 250 mg into the vein every 30 (thirty) days.       . Multiple Vitamin (MULTIVITAMIN) tablet Take 1 tablet by mouth daily.        . mycophenolate (CELLCEPT) 500 MG tablet Take 1,000 mg by mouth 2 (two) times daily.       Marland Kitchen PRESCRIPTION  MEDICATION Gets IVIG treatment twice a week...could not verify concentration and dose       . propranolol (INDERAL) 20 MG tablet TAKE ONE TABLET BY MOUTH THREE TIMES DAILY  90 tablet  0  . ranitidine (ZANTAC) 150 MG tablet Take 150 mg by mouth 2 (two) times daily.        . sodium chloride 0.9 % solution Inject into the vein continuous.          Allergies:  Allergies  Allergen Reactions  . Gluten Meal   . Wheat Bran     ROS:  Out of a complete 14 system review of symptoms, the patient complains only of the following symptoms, and all other reviewed systems are negative.  Weakness Gait disorder  Blood pressure 96/74, pulse 83, temperature 98.1 F (36.7 C), temperature source Oral, resp. rate 19, SpO2 100.00%.   Physical Exam  General: Patient is alert and cooperative at the time of the examination. Head: Pupils are equal round and reactive to light.  Discs are flat bilaterally. Neck: Neck is supple, no carotid bruits noted. Respiratory: Respiratory examination is clear. Cardiovascular: Cardiovascular examination reveals a regular rate and rhythm, no obvious murmurs or rubs noted. Skin: Extremities are without significant edema.  Neurologic Exam  Cranial Nerves: Facial symmetry is present.  Good sensation of the face to pinprick and soft touch bilaterally.  Strength of the facial muscles and the muscles to head turning and shoulder shrug are normal bilaterally.  Speech is well enunciated, no aphasia or dysarthria is noted.  Extraocular movements are full.  Visual fields are full. Motor: With motor testing, the patient has intrinsic muscle weakness bilaterally with the hands. The patient has good grip strength with the right hand, and good proximal strength with the right arm. With the left arm, the patient has weakness with biceps and triceps testing, and weakness with external and internal rotation of the shoulder on the left. The patient has significant weakness of the left anterior  deltoid muscle, without antigravity movement.  With the lower extremities, the patient has severe weakness with the right leg, proximally and distally, without antigravity movement. The left leg is stronger, with 4/5 strength throughout.  Sensory: Sensory testing is intact to pinprick, soft touch, vibratory sensation and position sense in all 4 extremities.  No evidence of extinction is noted. Coordination: The patient has difficulty performing finger-nose-finger with the left arm, she is unable to perform on the right.  The patient is able to perform heel-to-shin with the left leg with some difficulty. The patient is unable to perform with the right. Gait and Station: The patient can only walk with assistance. The patient can bear weight on the right leg only with the knee in the full extended position. The patient has a circumduction type gait with the right leg. Gait is wide-based, quite unsteady. Romberg is unsteady, with a tendency to fall. Tandem gait was not tested.  Reflexes: Deep tendon reflexes are absent throughout, toes are neutral bilaterally.  Assessment/Plan:  1. CIDP  2. Gait disorder  The patient has severe motor deficits, and a severe gait disorder. The patient has progressed while taking steroids, IVIG, and CellCept. The patient will be given a trial on plasma pheresis to see if this helps her clinical deficits. Plasmapheresis will be initiated this time.  The patient will receive plasmapheresis every other day or 5 treatments, and then go to one treatment every week for 6 months.   Jill Alexanders MD 11/16/2012 3:37 PM  [facility] [address] [place], [place] [address]  Phone [phone] Fax [phone]

## 2012-11-17 ENCOUNTER — Other Ambulatory Visit: Payer: Self-pay | Admitting: *Deleted

## 2012-11-17 DIAGNOSIS — E1065 Type 1 diabetes mellitus with hyperglycemia: Secondary | ICD-10-CM

## 2012-11-17 MED ORDER — GLUCAGON (RDNA) 1 MG IJ KIT: PACK | INTRAMUSCULAR | Status: DC

## 2012-11-23 ENCOUNTER — Other Ambulatory Visit: Payer: Self-pay | Admitting: "Endocrinology

## 2012-11-23 ENCOUNTER — Telehealth: Payer: Self-pay | Admitting: Neurology

## 2012-11-23 ENCOUNTER — Non-Acute Institutional Stay (HOSPITAL_COMMUNITY)
Admission: AD | Admit: 2012-11-23 | Discharge: 2012-11-23 | Disposition: A | Discharge status: Home / Self Care | Payer: BC Managed Care – PPO | Source: Ambulatory Visit | Attending: Neurology | Admitting: Neurology

## 2012-11-23 DIAGNOSIS — G6181 Chronic inflammatory demyelinating polyneuritis: Secondary | ICD-10-CM | POA: Insufficient documentation

## 2012-11-23 DIAGNOSIS — Z79899 Other long term (current) drug therapy: Secondary | ICD-10-CM | POA: Insufficient documentation

## 2012-11-23 DIAGNOSIS — K900 Celiac disease: Secondary | ICD-10-CM | POA: Insufficient documentation

## 2012-11-23 DIAGNOSIS — E039 Hypothyroidism, unspecified: Secondary | ICD-10-CM | POA: Insufficient documentation

## 2012-11-23 DIAGNOSIS — R269 Unspecified abnormalities of gait and mobility: Secondary | ICD-10-CM | POA: Insufficient documentation

## 2012-11-23 DIAGNOSIS — E109 Type 1 diabetes mellitus without complications: Secondary | ICD-10-CM | POA: Insufficient documentation

## 2012-11-23 DIAGNOSIS — H3530 Unspecified macular degeneration: Secondary | ICD-10-CM | POA: Insufficient documentation

## 2012-11-23 DIAGNOSIS — IMO0002 Reserved for concepts with insufficient information to code with codable children: Secondary | ICD-10-CM | POA: Insufficient documentation

## 2012-11-23 LAB — POCT I-STAT, CHEM 8
BUN: 12 mg/dL (ref 6–23)
Calcium, Ion: 1.28 mmol/L — ABNORMAL HIGH (ref 1.12–1.23)
Hemoglobin: 13.6 g/dL (ref 12.0–15.0)
Sodium: 134 mEq/L — ABNORMAL LOW (ref 135–145)
TCO2: 24 mmol/L (ref 0–100)

## 2012-11-23 MED ORDER — SODIUM CHLORIDE 0.9 % IV SOLN
INTRAVENOUS | Status: AC
  Administered 2012-11-23 (×2): via INTRAVENOUS_CENTRAL
  Filled 2012-11-23 (×2): qty 200

## 2012-11-23 MED ORDER — ACETAMINOPHEN 325 MG PO TABS: 650.0000 mg | ORAL_TABLET | ORAL | Status: DC | PRN

## 2012-11-23 MED ORDER — SODIUM CHLORIDE 0.9 % IV SOLN
4.0000 g | Freq: Once | INTRAVENOUS | Status: AC
  Administered 2012-11-23: 4 g via INTRAVENOUS
  Filled 2012-11-23: qty 40

## 2012-11-23 MED ORDER — DIPHENHYDRAMINE HCL 25 MG PO CAPS: 25.0000 mg | ORAL_CAPSULE | Freq: Four times a day (QID) | ORAL | Status: DC | PRN

## 2012-11-23 MED ORDER — ACD FORMULA A 0.73-2.45-2.2 GM/100ML VI SOLN
500.0000 mL | Status: DC
  Administered 2012-11-23: 500 mL via INTRAVENOUS

## 2012-11-23 MED ORDER — HEPARIN SODIUM (PORCINE) 1000 UNIT/ML IJ SOLN
1000.0000 [IU] | Freq: Once | INTRAMUSCULAR | Status: AC
  Administered 2012-11-23: 1000 [IU]

## 2012-11-23 MED ORDER — CALCIUM CARBONATE ANTACID 500 MG PO CHEW: 2.0000 | CHEWABLE_TABLET | ORAL | Status: DC

## 2012-11-23 NOTE — Telephone Encounter (Signed)
Patient's mother called stating Robyn will have her treatment today then they're going back to the [facility] on next week and Laria will miss three weeks of plasmapheresis treatment. Patient's mom just wanted to inform the physician.

## 2012-11-23 NOTE — H&P (Signed)
Reason for visit: CIDP  Jessica Kelley is a 22 y.o. female  History of present illness:   Ms. Jessica Kelley is a 22 year old left-handed white female with a history of CIDP that was diagnosed at age 22. Over the years, the patient has been treated with steroids, and IVIG. The patient was on cyclosporine earlier, but she has more recently been placed on CellCept. The patient had been doing relatively well on Solu-Medrol, getting 500 mg IV once a week. The patient has type 1 diabetes, and it was felt that long-term steroid use was not a good option for her. The patient went off of Solu-Medrol towards and the summer of 2013. The patient has had a gradual progressive problem with walking and use of the left arm since that time to the point that she now requires assistance with walking. The patient had been ambulating independently prior to that. The patient plays the drums, and she is no longer able to do this because of the arm weakness. The patient has developed weakness in the right hand as well. The patient has chronic weakness of the right greater left leg, but the left leg strength has also failed. The patient has fallen in December 2013 he the patient has been seen by Dr. Renea Ee at the [facility]. The patient currently is on IVIG taking treatments twice a week. The patient has not responded to IVIG.   Past Medical History  Diagnosis Date  . Diabetes type I     Dr. Karsten Ro  . Celiac disease   . Macular degeneration   . Hypothyroidism   . Wyburn-Mason syndrome   . Glaucoma   . Scoliosis   . CIDP (chronic inflammatory demyelinating polyneuropathy) 2001  . Pilonidal cyst     Past Surgical History  Procedure Laterality Date  . Cataract extraction  06/25/06  . Glaucoma surgery  02/12/06  . Foot arthroplasty      left foot moved and stretched tendons   . Hip arthroplasty      right never bipopsy  . Portacath placement  2002-2004  . Removal of vascular port    . Sciatic nerve muscle  biopsy      Family History  Problem Relation Age of Onset  . Prostate cancer Maternal Grandfather   . Cancer Maternal Grandfather     prostate  . Coronary artery disease Paternal Grandfather   . Hyperlipidemia    . Diabetes Sister   . Other Sister     thyroid issues  . Thyroid disease Mother     Social history:  reports that she has never smoked. She has never used smokeless tobacco. She reports that she does not drink alcohol or use illicit drugs.  Medications:  No current facility-administered medications on file prior to encounter.   Current Outpatient Prescriptions on File Prior to Encounter  Medication Sig Dispense Refill  . Cholecalciferol (VITAMIN D) 1000 UNITS capsule Take 1,000 Units by mouth daily.        . Cyanocobalamin (VITAMIN B-12 PO) Take by mouth.      . Ferrous Sulfate (RA IRON) 27 MG TABS Take by mouth 2 (two) times daily.        Marland Kitchen gabapentin (NEURONTIN) 100 MG capsule Take 100 mg by mouth 3 (three) times daily.        Marland Kitchen glucagon 1 MG injection Follow package directions for low blood sugar.  2 each  4  . HYDROcodone-acetaminophen (VICODIN) 5-500 MG per tablet Take 1 tablet by  mouth every 6 (six) hours as needed for pain.  5 tablet  0  . ibuprofen (ADVIL,MOTRIN) 200 MG tablet Take 600 mg by mouth every 8 (eight) hours. 2 days per week following treatment       . immune globulin, human, (POLYGAM) 10 G injection Inject 50 mg into the vein 2 (two) times a week.       . insulin aspart (NOVOLOG) 100 UNIT/ML injection Use with insulin pump, dispense 4 vials  10 mL  6  . lisinopril (ZESTRIL) 2.5 MG tablet Take 1 tablet (2.5 mg total) by mouth daily.  30 tablet  11  . methimazole (TAPAZOLE) 5 MG tablet Take 1 tablet (5 mg total) by mouth 1 day or 1 dose.  30 tablet  6  . methylPREDNISolone (MEDROL) 2 MG tablet Inject 250 mg into the vein every 30 (thirty) days.       . Multiple Vitamin (MULTIVITAMIN) tablet Take 1 tablet by mouth daily.        . mycophenolate (CELLCEPT)  500 MG tablet Take 1,000 mg by mouth 2 (two) times daily.       Marland Kitchen PRESCRIPTION MEDICATION Gets IVIG treatment twice a week...could not verify concentration and dose       . propranolol (INDERAL) 20 MG tablet TAKE ONE TABLET BY MOUTH THREE TIMES DAILY  90 tablet  0  . ranitidine (ZANTAC) 150 MG tablet Take 150 mg by mouth 2 (two) times daily.        . sodium chloride 0.9 % solution Inject into the vein continuous.          Allergies:  Allergies  Allergen Reactions  . Gluten Meal   . Wheat Bran     ROS:  Out of a complete 14 system review of symptoms, the patient complains only of the following symptoms, and all other reviewed systems are negative.  Weakness Gait disorder  Blood pressure 108/58, pulse 89, temperature 97.8 F (36.6 C), temperature source Oral, resp. rate 12, weight 112 lb 6.4 oz (50.984 kg), SpO2 100.00%.   Physical Exam  General: Patient is alert and cooperative at the time of the examination. Head: Pupils are equal round and reactive to light.  Discs are flat bilaterally. Neck: Neck is supple, no carotid bruits noted. Respiratory: Respiratory examination is clear. Cardiovascular: Cardiovascular examination reveals a regular rate and rhythm, no obvious murmurs or rubs noted. Skin: Extremities are without significant edema.  Neurologic Exam  Cranial Nerves: Facial symmetry is present.  Good sensation of the face to pinprick and soft touch bilaterally.  Strength of the facial muscles and the muscles to head turning and shoulder shrug are normal bilaterally.  Speech is well enunciated, no aphasia or dysarthria is noted.  Extraocular movements are full.  Visual fields are full. Motor: With motor testing, the patient has intrinsic muscle weakness bilaterally with the hands. The patient has good grip strength with the right hand, and good proximal strength with the right arm. With the left arm, the patient has weakness with biceps and triceps testing, and weakness with  external and internal rotation of the shoulder on the left. The patient has significant weakness of the left anterior deltoid muscle, without antigravity movement.  With the lower extremities, the patient has severe weakness with the right leg, proximally and distally, without antigravity movement. The left leg is stronger, with 4/5 strength throughout.  Sensory: Sensory testing is intact to pinprick, soft touch, vibratory sensation and position sense in all 4 extremities.  No evidence of extinction is noted. Coordination: The patient has difficulty performing finger-nose-finger with the left arm, she is unable to perform on the right. The patient is able to perform heel-to-shin with the left leg with some difficulty. The patient is unable to perform with the right. Gait and Station: The patient can only walk with assistance. The patient can bear weight on the right leg only with the knee in the full extended position. The patient has a circumduction type gait with the right leg. Gait is wide-based, quite unsteady. Romberg is unsteady, with a tendency to fall. Tandem gait was not tested.  Reflexes: Deep tendon reflexes are absent throughout, toes are neutral bilaterally.  Assessment/Plan:  1. CIDP  2. Gait disorder  The patient has severe motor deficits, and a severe gait disorder. The patient has progressed while taking steroids, IVIG, and CellCept. The patient will be given a trial on plasma pheresis to see if this helps her clinical deficits. Plasmapheresis will be initiated this time.  The patient will receive plasmapheresis every other day or 5 treatments, and then go to one treatment every week for 6 months.   Jill Alexanders MD 11/23/2012 1:09 PM  [facility] [address] [place], [place] [address]  Phone [phone] Fax [phone]

## 2012-11-23 NOTE — Telephone Encounter (Signed)
Noted. I did not call the mother.

## 2012-11-23 NOTE — Progress Notes (Signed)
TPE completed without issue. Pt to return Saturday 7/12 for next treatment. Then will be out of state for the next couple weeks. Will call to schedule appointment when back. Discharged to home with mother.

## 2012-11-26 ENCOUNTER — Encounter: Payer: Self-pay | Admitting: Pediatrics

## 2012-11-26 ENCOUNTER — Ambulatory Visit (INDEPENDENT_AMBULATORY_CARE_PROVIDER_SITE_OTHER): Payer: BC Managed Care – PPO | Admitting: Pediatrics

## 2012-11-26 ENCOUNTER — Other Ambulatory Visit: Payer: Self-pay

## 2012-11-26 VITALS — BP 96/70 | HR 72 | Ht 63.5 in | Wt 112.2 lb

## 2012-11-26 DIAGNOSIS — G6181 Chronic inflammatory demyelinating polyneuritis: Secondary | ICD-10-CM

## 2012-11-26 DIAGNOSIS — G43009 Migraine without aura, not intractable, without status migrainosus: Secondary | ICD-10-CM

## 2012-11-26 DIAGNOSIS — R269 Unspecified abnormalities of gait and mobility: Secondary | ICD-10-CM

## 2012-11-26 DIAGNOSIS — G4700 Insomnia, unspecified: Secondary | ICD-10-CM

## 2012-11-26 DIAGNOSIS — M4150 Other secondary scoliosis, site unspecified: Secondary | ICD-10-CM

## 2012-11-26 DIAGNOSIS — G44229 Chronic tension-type headache, not intractable: Secondary | ICD-10-CM

## 2012-11-26 DIAGNOSIS — E109 Type 1 diabetes mellitus without complications: Secondary | ICD-10-CM

## 2012-11-26 DIAGNOSIS — E063 Autoimmune thyroiditis: Secondary | ICD-10-CM

## 2012-11-26 NOTE — Progress Notes (Signed)
Patient: Jessica Kelley MRN: [id number] Sex: female DOB: 1990/07/31  Provider: Jodi Geralds, MD Location of Care: [facility]  Note type: Routine return visit  History of Present Illness: Referral Source: Drs. Garnet Koyanagi, Jill Alexanders History from: mother, patient and [facility] chart Chief Complaint: Headaches/Migraines, CIDP  Jessica Kelley is a 22 y.o. female who returns for evaluation and management of CIDP and headaches.  She returns for evaluation November 26, 2012.  This was a previously scheduled visit before I sent her to Dr. Floyde Parkins for ongoing treatment of her chronic inflammatory demyelinating polyneuropathy.  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  The patient has been followed yearly at [facility] by Dr. Laurice Record B. Dyck.  She is going back to [facility] next week.  The patient had been treated with IVIG 50 mg twice weekly.  She had been on methylprednisolone and mycophenolate twice weekly.  She stopped taking the methylprednisolone and significantly deteriorated.  She needed assistance walking all the time.  She had some falls one of which that required stitches in the back of her head.  CT scan of the brain fortunately was normal.  She was independent for feeding, dressing, and hygiene.  I concluded that the patient was weaker and did not have ideas about how best to alter her treatment.  After her visit, I recommended that she see Dr. Floyde Parkins, a colleague of mine who specializes in neuromuscular disorders.  He recommended discontinuing IVIG and treating her with plasma  exchange.    She had a five day course beginning in mid April of plasma exchange and thereafter has had plasma exchange once per week.  At present, it seems that her examination is stable, but she states that she used to be able to walk across the room in her house without assistance and now she is unable to do so.  She has a severely weak right leg, which formally tests weaker than her functional strength.  She is able to ambulate and bear weight on the right leg with use of a cane on the right and steadying her left arm with another person, by flinging the leg forward.  To do this she has to flex her hip and the leg basically swings on through.  She is then able to plant and bear weight on it long enough to swing her left leg through for her next step.  She did not complain of daily headaches today as she had.  She says overall her serum glucoses are better.  Her thyroid functions are better and there have been no serious illnesses.  She is hopeful that the Port-a-Cath that was placed for plasma exchange can be changed to some other device that does not protrude through the skin.  She has not been able to shower since it was placed.  I do not know if there are double-barreled Broviac ports that are subcutaneous.  As best I know, she has not fallen and struck her head since I last saw her.  I should mention at the time that plasma exchange was started, the patient also started back on methylprednisolone and now receives that 500 mg  once a week.  She has regained some strength since starting methylprednisolone, but no other significant changes have been evident.  Her current immune therapy includes methylprednisolone, mycophenolate, and plasma exchange.  She is not showing significant signs of neuropathic pain, although she is on gabapentin for muscle pain.  Her autoimmune conditions are followed by Dr. Tillman Sers.  She is trying to adhere to a gluten-free diet because of her celiac  disease.  Review of Systems: 12 system review was unremarkable  Past Medical History  Diagnosis Date  . Diabetes type I     Dr. Tobe Sos  . Celiac disease   . Macular degeneration   . Hypothyroidism   . Wyburn-Mason syndrome   . Glaucoma   . Scoliosis   . CIDP (chronic inflammatory demyelinating polyneuropathy) 2001  . Pilonidal cyst    Hospitalizations: yes, Head Injury: yes, Nervous System Infections: no, Immunizations up to date: yes Past Medical History Comments: See surgical Hx for hospitalizations.  Birth History  7 pound 9 ounce infant born at full-term. Gestation was uncomplicated. Labor lasted 23 hours. Operative delivery with forceps. Nursery course was uneventful. Growth and development is recalled as normal.  Behavior History none  Surgical History Past Surgical History  Procedure Laterality Date  . Cataract extraction  06/25/06  . Glaucoma surgery  02/12/06  . Foot arthroplasty      left foot moved and stretched tendons   . Hip arthroplasty      right never bipopsy  . Portacath placement  2002-2004  . Removal of vascular port    . Sciatic nerve muscle biopsy    Placement of catheter for plasma exchange April, 2014.  Family History family history includes Cancer in her maternal grandfather and paternal grandmother; Coronary artery disease in her paternal grandfather; Diabetes in her sister; Hyperlipidemia in an unspecified family member; Other in her sister; Prostate cancer in her maternal grandfather; and Thyroid disease in her mother. Family History is negative migraines, seizures, cognitive impairment, blindness, deafness, birth defects, chromosomal disorder, autism.  Social History History   Social History  . Marital Status: Single    Spouse Name: N/A    Number of Children: N/A  . Years of Education: N/A   Social History Main Topics  . Smoking status: Never Smoker   . Smokeless tobacco: Never Used     Comment: passive smoke exposure  .  Alcohol Use: No  . Drug Use: No  . Sexually Active: None   Other Topics Concern  . None   Social History Narrative  . None   High school graduate. Has attended [employer]. Living with both parents   Current Outpatient Prescriptions on File Prior to Visit  Medication Sig Dispense Refill  . Cholecalciferol (VITAMIN D) 1000 UNITS capsule Take 1,000 Units by mouth daily.        . Cyanocobalamin (VITAMIN B-12 PO) Take by mouth.      . Ferrous Sulfate (RA IRON) 27 MG TABS Take by mouth 2 (two) times daily.        Marland Kitchen gabapentin (NEURONTIN) 100 MG capsule Take 100 mg by mouth 3 (three) times daily.        Marland Kitchen glucagon 1 MG injection Follow package directions for low blood sugar.  2 each  4  . insulin aspart (NOVOLOG) 100 UNIT/ML injection Use with insulin pump, dispense 4 vials  10 mL  6  . methimazole (TAPAZOLE) 5 MG tablet Take 1 tablet (5 mg total) by mouth 1 day or 1  dose.  30 tablet  6  . methylPREDNISolone (MEDROL) 2 MG tablet Inject 250 mg into the vein every 30 (thirty) days.       . mycophenolate (CELLCEPT) 500 MG tablet Take 1,000 mg by mouth 2 (two) times daily.       . propranolol (INDERAL) 20 MG tablet TAKE ONE TABLET BY MOUTH THREE TIMES DAILY  90 tablet  0  . sodium chloride 0.9 % solution Inject into the vein continuous.         No current facility-administered medications on file prior to visit.   The medication list was reviewed and reconciled. All changes or newly prescribed medications were explained.  A complete medication list was provided to the patient/caregiver.  Allergies  Allergen Reactions  . Gluten Meal   . Wheat Bran     Physical Exam BP 96/70  Pulse 72  Ht 5' 3.5" (1.613 m)  Wt 112 lb 3.2 oz (50.894 kg)  BMI 19.56 kg/m2  General: alert, well developed, well nourished, in no acute distress, blond hair, blue eyes, left-handed Head: normocephalic, no dysmorphic features Ears, Nose and Throat: Otoscopic: tympanic membranes normal .  Pharynx: oropharynx is pink  without exudates or tonsillar hypertrophy. Neck: supple, full range of motion, no cranial or cervical bruits Respiratory: auscultation clear Cardiovascular: no murmurs, pulses are normal Musculoskeletal: the patient has some atrophy of her right leg, distal atrophy in her calves, and convex right scoliosis that is mild Skin: no rashes or neurocutaneous lesions  Neurologic Exam  Mental Status: alert; oriented to person, place, and year; knowledge is normal for age; language is normal Cranial Nerves: visual fields are full to double simultaneous stimuli; extraocular movements are full and conjugate; pupils are round reactive to light with a left afferent pupillary defect, left iridectomy with lens implant; funduscopic examination shows sharp disc margins with normal vessels on the right and dilated vessels part of a vascular anomaly known as [place] which caused glaucoma; symmetric facial strength; midline tongue and uvula; air conduction is greater than bone conduction bilaterally. Motor: normal strength in her neck except extensors 4+. Deltoid 4 right, 3 left, biceps 4 right, 4-  left,  triceps 4+ right, 4- left, wrist extensors 4+ right, 5- left, wrist flexors  4+ bilaterally, grip 4+ right 5 left, finger extensors 4-4+ bilaterally,Supraspinatous for right, 4 minus left, pronator 4+ bilaterally, supinator 4 bilaterally, pectoralis 5 bilaterally, hip flexors 2 right, 3 left ,  hip adductors 0 right, 2 left, hip abductors 0 right 2 left;  knee flexors 1 right, 3  left; knee extensors 0 right, 3 left, foot dorsiflexors and plantar flexors 0-1 right, 3 left, toes 0 right, 3 left Sensory:  No significant peripheral polyneuropathy to cold or vibration. Proprioception appears intact The patient had normal stereognosis Coordination: good finger-to-nose, rapid repetitive alternating movements and finger apposition   Gait and Station: diplegia gait with waddling, steppage right greater than left, external  rotation of her legs, broad-based,  arms extended out from her side for balance; unable to walk on her heels or toes, perform tandem  Gower response was not attempted, Unable to walk unless she grasps her right cane and someone holds onto her left arm for balance. Reflexes: symmetric and absent bilaterally; no clonus; bilateral flexor plantar responses.  Assessment 1. Chronic inflammatory demyelinating polyneuropathy 357.81. 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 3. Migraine without aura 346.10. 4. Chronic tension-type headache 339.12. 5. Insomnia 780.52. 6. Chronic lymphocytic thyroiditis 245.2. 7. Scoliosis without other condition.  8. Type 1 diabetes mellitus 250.01.  Plan I am not going to recommend any changes in her medications.  Dr. Jannifer Franklin is in-charge of her treatment for COPD.  I made that clear to Jessica Kelley and her mother.  I will be interested to see what Dr. Lerry Paterson recommends.  At her mother's request, I will see her again in six months' time, but I made it clear that her transition of care and the responsibility for her neuropathy rests with Dr. Jannifer Franklin.  I also told her that I would mention the concerns over the Port-a-Cath.  I do not know if there are other solutions.  I spent 30 minutes of face-to-face time with the patient and her mother, more than half of it in consultation.  Jodi Geralds MD

## 2012-11-27 ENCOUNTER — Encounter: Payer: Self-pay | Admitting: Pediatrics

## 2012-11-28 ENCOUNTER — Non-Acute Institutional Stay (HOSPITAL_COMMUNITY)
Admission: AD | Admit: 2012-11-28 | Discharge: 2012-11-28 | Disposition: A | Discharge status: Home / Self Care | Payer: BC Managed Care – PPO | Source: Ambulatory Visit | Attending: Neurology | Admitting: Neurology

## 2012-11-28 DIAGNOSIS — M4120 Other idiopathic scoliosis, site unspecified: Secondary | ICD-10-CM | POA: Insufficient documentation

## 2012-11-28 DIAGNOSIS — R269 Unspecified abnormalities of gait and mobility: Secondary | ICD-10-CM | POA: Insufficient documentation

## 2012-11-28 DIAGNOSIS — H3530 Unspecified macular degeneration: Secondary | ICD-10-CM | POA: Insufficient documentation

## 2012-11-28 DIAGNOSIS — E109 Type 1 diabetes mellitus without complications: Secondary | ICD-10-CM | POA: Insufficient documentation

## 2012-11-28 DIAGNOSIS — Z79899 Other long term (current) drug therapy: Secondary | ICD-10-CM | POA: Insufficient documentation

## 2012-11-28 DIAGNOSIS — Z91018 Allergy to other foods: Secondary | ICD-10-CM | POA: Insufficient documentation

## 2012-11-28 DIAGNOSIS — Z9181 History of falling: Secondary | ICD-10-CM | POA: Insufficient documentation

## 2012-11-28 DIAGNOSIS — G6181 Chronic inflammatory demyelinating polyneuritis: Secondary | ICD-10-CM | POA: Insufficient documentation

## 2012-11-28 DIAGNOSIS — K900 Celiac disease: Secondary | ICD-10-CM | POA: Insufficient documentation

## 2012-11-28 LAB — COMPREHENSIVE METABOLIC PANEL
BUN: 9 mg/dL (ref 6–23)
CO2: 23 mEq/L (ref 19–32)
Creat: 0.49 mg/dL — ABNORMAL LOW (ref 0.50–1.10)
Glucose, Bld: 254 mg/dL — ABNORMAL HIGH (ref 70–99)
Total Bilirubin: 0.3 mg/dL (ref 0.3–1.2)
Total Protein: 5.3 g/dL — ABNORMAL LOW (ref 6.0–8.3)

## 2012-11-28 LAB — POCT I-STAT, CHEM 8
Calcium, Ion: 1.26 mmol/L — ABNORMAL HIGH (ref 1.12–1.23)
Creatinine, Ser: 0.4 mg/dL — ABNORMAL LOW (ref 0.50–1.10)
Glucose, Bld: 304 mg/dL — ABNORMAL HIGH (ref 70–99)
HCT: 42 % (ref 36.0–46.0)
Hemoglobin: 14.3 g/dL (ref 12.0–15.0)
TCO2: 28 mmol/L (ref 0–100)

## 2012-11-28 LAB — CBC WITH DIFFERENTIAL/PLATELET
Basophils Absolute: 0.1 10*3/uL (ref 0.0–0.1)
Basophils Relative: 1 % (ref 0–1)
Eosinophils Absolute: 0.1 10*3/uL (ref 0.0–0.7)
MCH: 28.3 pg (ref 26.0–34.0)
MCHC: 31.8 g/dL (ref 30.0–36.0)
Neutrophils Relative %: 58 % (ref 43–77)
Platelets: 208 10*3/uL (ref 150–400)
RBC: 4.73 MIL/uL (ref 3.87–5.11)

## 2012-11-28 LAB — T3, FREE: T3, Free: 4.2 pg/mL (ref 2.3–4.2)

## 2012-11-28 MED ORDER — CALCIUM CARBONATE ANTACID 500 MG PO CHEW: 2.0000 | CHEWABLE_TABLET | ORAL | Status: DC

## 2012-11-28 MED ORDER — ACETAMINOPHEN 325 MG PO TABS: 650.0000 mg | ORAL_TABLET | ORAL | Status: DC | PRN

## 2012-11-28 MED ORDER — HEPARIN SODIUM (PORCINE) 1000 UNIT/ML IJ SOLN
1000.0000 [IU] | Freq: Once | INTRAMUSCULAR | Status: AC
  Administered 2012-11-28: 1000 [IU]

## 2012-11-28 MED ORDER — SODIUM CHLORIDE 0.9 % IV SOLN
4.0000 g | Freq: Once | INTRAVENOUS | Status: AC
  Administered 2012-11-28: 4 g via INTRAVENOUS
  Filled 2012-11-28: qty 40

## 2012-11-28 MED ORDER — SODIUM CHLORIDE 0.9 % IV SOLN
INTRAVENOUS | Status: AC
  Administered 2012-11-28 (×2): via INTRAVENOUS_CENTRAL
  Filled 2012-11-28 (×2): qty 200

## 2012-11-28 MED ORDER — DIPHENHYDRAMINE HCL 25 MG PO CAPS: 25.0000 mg | ORAL_CAPSULE | Freq: Four times a day (QID) | ORAL | Status: DC | PRN

## 2012-11-28 MED ORDER — ACD FORMULA A 0.73-2.45-2.2 GM/100ML VI SOLN
500.0000 mL | Status: DC
  Administered 2012-11-28: 500 mL via INTRAVENOUS

## 2012-11-28 NOTE — Progress Notes (Signed)
Pt tolerated plasma exchange well today.  She and mom are aware to call unit upon arrival back to town the first week in Aug to schedule next tx. Pt dc'd to home with mom.

## 2012-11-29 NOTE — H&P (Signed)
Reason for visit: CIDP  Jessica Kelley is a 22 y.o. female  History of present illness:   Jessica Kelley is a 22 year old left-handed white female with a history of CIDP that was diagnosed at age 49. Over the years, the patient has been treated with steroids, and IVIG. The patient was on cyclosporine earlier, but she has more recently been placed on CellCept. The patient had been doing relatively well on Solu-Medrol, getting 500 mg IV once a week. The patient has type 1 diabetes, and it was felt that long-term steroid use was not a good option for her. The patient went off of Solu-Medrol towards and the summer of 2013. The patient has had a gradual progressive problem with walking and use of the left arm since that time to the point that she now requires assistance with walking. The patient had been ambulating independently prior to that. The patient plays the drums, and she is no longer able to do this because of the arm weakness. The patient has developed weakness in the right hand as well. The patient has chronic weakness of the right greater left leg, but the left leg strength has also failed. The patient has fallen in December 2013 he the patient has been seen by Dr. Renea Ee at the [facility]. The patient currently is on IVIG taking treatments twice a week. The patient has not responded to IVIG.   Past Medical History  Diagnosis Date  . Diabetes type I     Dr. Tobe Sos  . Celiac disease   . Macular degeneration   . Hypothyroidism   . Wyburn-Mason syndrome   . Glaucoma   . Scoliosis   . CIDP (chronic inflammatory demyelinating polyneuropathy) 2001  . Pilonidal cyst     Past Surgical History  Procedure Laterality Date  . Cataract extraction  06/25/06  . Glaucoma surgery  02/12/06  . Foot arthroplasty      left foot moved and stretched tendons   . Hip arthroplasty      right never bipopsy  . Portacath placement  2002-2004  . Removal of vascular port    . Sciatic nerve muscle  biopsy      Family History  Problem Relation Age of Onset  . Prostate cancer Maternal Grandfather   . Cancer Maternal Grandfather     prostate  . Coronary artery disease Paternal Grandfather   . Hyperlipidemia    . Diabetes Sister   . Other Sister     thyroid issues  . Thyroid disease Mother   . Cancer Paternal Grandmother     Died at 48    Social history:  reports that she has never smoked. She has never used smokeless tobacco. She reports that she does not drink alcohol or use illicit drugs.  Medications:  No current facility-administered medications on file prior to encounter.   Current Outpatient Prescriptions on File Prior to Encounter  Medication Sig Dispense Refill  . Cholecalciferol (VITAMIN D) 1000 UNITS capsule Take 1,000 Units by mouth daily.        . Cyanocobalamin (VITAMIN B-12 PO) Take by mouth.      . Ferrous Sulfate (RA IRON) 27 MG TABS Take by mouth 2 (two) times daily.        Marland Kitchen gabapentin (NEURONTIN) 100 MG capsule Take 100 mg by mouth 3 (three) times daily.        Marland Kitchen glucagon 1 MG injection Follow package directions for low blood sugar.  2 each  4  .  insulin aspart (NOVOLOG) 100 UNIT/ML injection Use with insulin pump, dispense 4 vials  10 mL  6  . methimazole (TAPAZOLE) 5 MG tablet Take 1 tablet (5 mg total) by mouth 1 day or 1 dose.  30 tablet  6  . methylPREDNISolone (MEDROL) 2 MG tablet Inject 250 mg into the vein every 30 (thirty) days.       . mycophenolate (CELLCEPT) 500 MG tablet Take 1,000 mg by mouth 2 (two) times daily.       . propranolol (INDERAL) 20 MG tablet TAKE ONE TABLET BY MOUTH THREE TIMES DAILY  90 tablet  0  . sodium chloride 0.9 % solution Inject into the vein continuous.          Allergies:  Allergies  Allergen Reactions  . Gluten Meal   . Wheat Bran     ROS:  Out of a complete 14 system review of symptoms, the patient complains only of the following symptoms, and all other reviewed systems are negative.  Weakness Gait  disorder  Blood pressure 98/65, pulse 85, temperature 97.7 F (36.5 C), temperature source Oral, resp. rate 14, weight 112 lb 3.2 oz (50.894 kg), SpO2 100.00%.   Physical Exam  General: Patient is alert and cooperative at the time of the examination. Head: Pupils are equal round and reactive to light.  Discs are flat bilaterally. Neck: Neck is supple, no carotid bruits noted. Respiratory: Respiratory examination is clear. Cardiovascular: Cardiovascular examination reveals a regular rate and rhythm, no obvious murmurs or rubs noted. Skin: Extremities are without significant edema.  Neurologic Exam  Cranial Nerves: Facial symmetry is present.  Good sensation of the face to pinprick and soft touch bilaterally.  Strength of the facial muscles and the muscles to head turning and shoulder shrug are normal bilaterally.  Speech is well enunciated, no aphasia or dysarthria is noted.  Extraocular movements are full.  Visual fields are full. Motor: With motor testing, the patient has intrinsic muscle weakness bilaterally with the hands. The patient has good grip strength with the right hand, and good proximal strength with the right arm. With the left arm, the patient has weakness with biceps and triceps testing, and weakness with external and internal rotation of the shoulder on the left. The patient has significant weakness of the left anterior deltoid muscle, without antigravity movement.  With the lower extremities, the patient has severe weakness with the right leg, proximally and distally, without antigravity movement. The left leg is stronger, with 4/5 strength throughout.  Sensory: Sensory testing is intact to pinprick, soft touch, vibratory sensation and position sense in all 4 extremities.  No evidence of extinction is noted. Coordination: The patient has difficulty performing finger-nose-finger with the left arm, she is unable to perform on the right. The patient is able to perform heel-to-shin  with the left leg with some difficulty. The patient is unable to perform with the right. Gait and Station: The patient can only walk with assistance. The patient can bear weight on the right leg only with the knee in the full extended position. The patient has a circumduction type gait with the right leg. Gait is wide-based, quite unsteady. Romberg is unsteady, with a tendency to fall. Tandem gait was not tested.  Reflexes: Deep tendon reflexes are absent throughout, toes are neutral bilaterally.  Assessment/Plan:  1. CIDP  2. Gait disorder  The patient has severe motor deficits, and a severe gait disorder. The patient has progressed while taking steroids, IVIG, and CellCept. The  patient will be given a trial on plasma pheresis to see if this helps her clinical deficits. Plasmapheresis will be initiated this time.  The patient will receive plasmapheresis every other day or 5 treatments, and then go to one treatment every week for 6 months.   Jill Alexanders MD 11/29/2012 7:01 PM  [facility] [address] [place], [place] [address]  Phone [phone] Fax [phone]

## 2012-12-01 ENCOUNTER — Telehealth: Payer: Self-pay | Admitting: Neurology

## 2012-12-01 MED ORDER — GABAPENTIN 100 MG PO CAPS: 100.0000 mg | ORAL_CAPSULE | Freq: Three times a day (TID) | ORAL | Status: DC

## 2012-12-01 NOTE — Telephone Encounter (Signed)
Per notes in Epic/Centricity, Dr Gaynell Face is no longer filling this medication.

## 2012-12-11 ENCOUNTER — Telehealth: Payer: Self-pay | Admitting: Family

## 2012-12-11 ENCOUNTER — Telehealth: Payer: Self-pay | Admitting: "Endocrinology

## 2012-12-11 NOTE — Telephone Encounter (Signed)
Mom Jessica Kelley called. I called her back and she said that they want to switch to just seeing you. Mom wants to know if you will agree. She said that they have had quite a few mistakes with medications with Dr Jannifer Franklin' office and wants to switch to just you.They are just not comfortable there because of medication problems and want to just have you as neurologist. She hasn't told Dr Jannifer Franklin yet because she know you are friends and colleagues and doesn't want to have bad feelings etc and wanted to talk with you first. She does plan on talking with Dr Jannifer Franklin. She is not going to have plasmapharesis anymore, going to just have IVIG x2 week and steroids 1x week. She saw Dr at [facility] last week and will continue following up there. She wants orders called to [facility]. Please call Mom at [phone]. TG

## 2012-12-11 NOTE — Telephone Encounter (Signed)
I agreed to take her back on.  I hope that we have old orders that we can use for the IVIG.  If not, they will be back in [place] on Monday.  Send the orders if you feel that you can, do not send them if you're not certain until you talk to mother.  Also If you have time make certain that are medication list is correct.Thank you.

## 2012-12-11 NOTE — Telephone Encounter (Signed)
1. I called mother with Lemma's lab results from earlier this month. CBC and CMP were normal, indicating no adverse effects of MTZ. She is still hyperthyroid.  2. Please increase the methimazole dose to one 5 mg tablet, twice daily. Please repeat labs in about one month. Sherrlyn Hock

## 2012-12-15 NOTE — Telephone Encounter (Signed)
I contacted [facility]. They will send out orders for Dr Hickling's signature for IV Gammguard and Solumedrol. I will contact Mom later to verify medication list. TG

## 2012-12-18 NOTE — Telephone Encounter (Signed)
I left a message for Mom and asked her to return my call. TG

## 2012-12-21 ENCOUNTER — Encounter: Payer: Self-pay | Admitting: Family

## 2012-12-21 NOTE — Telephone Encounter (Signed)
Mom has not called me back. I will mail a letter asking her to call. TG

## 2012-12-23 ENCOUNTER — Telehealth: Payer: Self-pay | Admitting: Family

## 2012-12-23 NOTE — Telephone Encounter (Signed)
Mom Jessica Kelley called about Jessica Kelley. She said that she started IVIG and steroids Monday. This morning she woke with horrible headache that is potential side effect of IVIG. Mom said that she did this when she was 22 years old and had to have oral prednisone then with the IVIG headaches (she did not receive IV steroids at that time). Since she was off treatment for last 4 months, Mom wonders if that is why headaches are there again with restart of treatment. She has given her Ibuprofen 800 mg every 4 hours today but has only dulled the pain slightly. She said the pain is throbbing, she is intolerant to light. She can't tolerant movement. She has been lying down all day. She is not nauseated but has had little appetite. Mom wonders if she should have Predisone for headaches as she starts IVIG regimen again or if there is something else that can be done for severe headaches. Mom's # [id number] or you can call Jessica Kelley at [phone]. I updated her medication list. TG

## 2012-12-23 NOTE — Telephone Encounter (Signed)
I spoke with the patient, and she is feeling better and does not want to go to the hospital for migraine cocktail.  She has taken Prednisone in the past.  She takes a dose of 20 mg a day before and the day of and one day after.  Since she's taking IVIG twice a week she'll be taking this virtually every day.  I think she'll probably get used to the IVIG and not need prednisone.  I asked her to discuss this with her mother and to call back with their request and we will fill the prescription.

## 2012-12-24 ENCOUNTER — Telehealth: Payer: Self-pay | Admitting: Family

## 2012-12-24 DIAGNOSIS — G43009 Migraine without aura, not intractable, without status migrainosus: Secondary | ICD-10-CM

## 2012-12-24 MED ORDER — PREDNISONE 20 MG PO TABS: ORAL_TABLET | ORAL | Status: DC

## 2012-12-24 NOTE — Telephone Encounter (Signed)
Mom Jessica Kelley called to say that they verified that the dose of Prednisone that Jessica Kelley took in the past was 2m, and they discussed it and she [facility]

## 2012-12-24 NOTE — Telephone Encounter (Signed)
I sent a prescription.  I discussed my concerns with mother about exacerbating her diabetes.  I told her to get over-the-counter Pepcid and take it daily to protect her stomach.  Hopefully this will not be necessary for long.  Mother is going to talk with Dr. Tobe Sos about adjusting her daughter's insulin.

## 2012-12-28 ENCOUNTER — Other Ambulatory Visit: Payer: Self-pay | Admitting: "Endocrinology

## 2012-12-28 DIAGNOSIS — E1065 Type 1 diabetes mellitus with hyperglycemia: Secondary | ICD-10-CM

## 2013-01-01 ENCOUNTER — Telehealth: Payer: Self-pay

## 2013-01-01 DIAGNOSIS — G6181 Chronic inflammatory demyelinating polyneuritis: Secondary | ICD-10-CM

## 2013-01-01 MED ORDER — MYCOPHENOLATE MOFETIL 500 MG PO TABS: 1000.0000 mg | ORAL_TABLET | Freq: Two times a day (BID) | ORAL | Status: DC

## 2013-01-01 NOTE — Telephone Encounter (Signed)
Jessica Kelley stating that pt was going to need refill for next week and needed refills to be sent to [facility] I called mom and let her know we received her message.

## 2013-01-01 NOTE — Telephone Encounter (Signed)
Rx has been sent. TG

## 2013-01-07 ENCOUNTER — Telehealth: Payer: Self-pay | Admitting: "Endocrinology

## 2013-01-07 DIAGNOSIS — E0500 Thyrotoxicosis with diffuse goiter without thyrotoxic crisis or storm: Secondary | ICD-10-CM

## 2013-01-07 MED ORDER — METHIMAZOLE 5 MG PO TABS: ORAL_TABLET | ORAL | Status: DC

## 2013-01-07 NOTE — Telephone Encounter (Signed)
MOM CALLED AN ASKED THAT YOU CALL IN RX FOR methimazole (TAPAZOLE) 5 MG tablet , PER MOM STATES DR. BRENNAN UP DOSE TO 2 A DAY AN HAS RAN OUT AN ASK MAY YOU REFILL,, [facility] [address]

## 2013-01-20 ENCOUNTER — Other Ambulatory Visit: Payer: Self-pay

## 2013-01-20 DIAGNOSIS — G43009 Migraine without aura, not intractable, without status migrainosus: Secondary | ICD-10-CM

## 2013-01-20 MED ORDER — PREDNISONE 20 MG PO TABS: ORAL_TABLET | ORAL | Status: DC

## 2013-01-20 NOTE — Telephone Encounter (Signed)
I share your concern.  Family is aware of the issues, and this is a reasonable plan.

## 2013-01-20 NOTE — Telephone Encounter (Signed)
I received a refill request for Prednisone for Jessica Kelley. Dr Hickling's note indicates that he only wanted her to take it for a month, so I called Mom to ask how Jessica Kelley was doing. She said that because of the IVIG scheduled, Jessica Kelley has been taking it 6 days per week. Her blood sugar has been good and the headaches have subsided. Their plan for this month is to take it 4 days a week for a few weeks, then go to 2 days per week, then only on the IVIG day. I told Mom that we were concerned about Jessica Kelley taking the Prednisone and asked her to stay in contact with [place] to report how she was doing. Mom agreed. I sent in electronic refill for the medication. TG

## 2013-02-05 ENCOUNTER — Other Ambulatory Visit: Payer: Self-pay | Admitting: Family

## 2013-02-05 DIAGNOSIS — G6181 Chronic inflammatory demyelinating polyneuritis: Secondary | ICD-10-CM

## 2013-02-05 MED ORDER — GABAPENTIN 100 MG PO CAPS: ORAL_CAPSULE | ORAL | Status: DC

## 2013-02-09 ENCOUNTER — Other Ambulatory Visit: Payer: Self-pay | Admitting: *Deleted

## 2013-02-09 DIAGNOSIS — E038 Other specified hypothyroidism: Secondary | ICD-10-CM

## 2013-02-11 LAB — TSH: TSH: 0.011 u[IU]/mL — ABNORMAL LOW (ref 0.350–4.500)

## 2013-02-18 ENCOUNTER — Telehealth: Payer: Self-pay | Admitting: "Endocrinology

## 2013-02-18 NOTE — Telephone Encounter (Signed)
1. Mom called to discuss recent lab results. I spoke with Jessica Kelley directly. 2. TFTs from 02/11/13: TSH 0.111, free T4 2.02, free T3 4.0 3. She is taking methimazole, 5 mg, twice daily. 4. She feels hyperthyroid. 5. Assessment: She is somewhat better than two months ago, but still hyperthyroid. She needs more methimazole.  6. Plan: Increase the methimazole to 10 mg AM and 5 mg PM. Repeat TFTs, CBC, and CMP in one month.

## 2013-03-08 ENCOUNTER — Other Ambulatory Visit: Payer: Self-pay | Admitting: *Deleted

## 2013-03-08 DIAGNOSIS — E1065 Type 1 diabetes mellitus with hyperglycemia: Secondary | ICD-10-CM

## 2013-03-08 MED ORDER — INSULIN ASPART 100 UNIT/ML ~~LOC~~ SOLN: SUBCUTANEOUS | Status: DC

## 2013-03-09 ENCOUNTER — Other Ambulatory Visit: Payer: Self-pay | Admitting: *Deleted

## 2013-03-09 DIAGNOSIS — E0500 Thyrotoxicosis with diffuse goiter without thyrotoxic crisis or storm: Secondary | ICD-10-CM

## 2013-03-09 MED ORDER — METHIMAZOLE 5 MG PO TABS: ORAL_TABLET | ORAL | Status: DC

## 2013-03-29 ENCOUNTER — Other Ambulatory Visit: Payer: Self-pay | Admitting: Family

## 2013-04-27 ENCOUNTER — Other Ambulatory Visit: Payer: Self-pay | Admitting: *Deleted

## 2013-04-27 DIAGNOSIS — E0500 Thyrotoxicosis with diffuse goiter without thyrotoxic crisis or storm: Secondary | ICD-10-CM

## 2013-05-26 ENCOUNTER — Ambulatory Visit: Payer: BC Managed Care – PPO | Admitting: "Endocrinology

## 2013-05-27 ENCOUNTER — Telehealth: Payer: Self-pay | Admitting: *Deleted

## 2013-05-27 NOTE — Telephone Encounter (Signed)
05/27/13 1:45pm - Mom, Lauren, called to request our assistance as soon as possible.  [employer] has rejected the IVIG treatments because they states we sent records stating Jasnoor had been transferred to Dr. Jannifer Franklin and switched to plasmapharesis.  Dr. Gaynell Face must call the medical direct at [facility] [phone] ext [phone]) and we must fax documentation reflecting Vernona is a current patient here and she did start back on IVIG.  She is due for her next IVIG Monday; however, that will not happen unless the appeal is approved. The fax number to send the documentation is [phone].  If there are specific questions related to dosing and IVIG we can call Mertha Finders (RN from [facility] that comes to their house) at [phone].  Lauren's number is [phone].

## 2013-05-28 ENCOUNTER — Encounter: Payer: Self-pay | Admitting: Family

## 2013-05-28 NOTE — Telephone Encounter (Signed)
I have contacted [facility] and have an appointment with a Market researcher at 10:45 AM.  The note in the chart did substantiate my ongoing care Include a telephone note from December 11, 2012, a copy of a [facility] evaluation in July 2014 scanned on February 01, 2013, and orders for IVIG to [facility] from January 13, 2013.  She needs to be seen every 6 months.  It's okay with me if we use a new patient evaluation, though I would prefer a cancellation return visit.

## 2013-05-31 NOTE — Telephone Encounter (Signed)
I received calls from [place], RN from [place] as well as from Georgana Curio, [place] mother. Susie said that Kelsey has received IVIG 2 x per week along with Solumedrol faithfully until this week, when she missed today because of insurance. She has asked their insurance liaison to follow up on this as well. Susie's number is [phone].   Lauren's mother left messages asking about status of insurance approval. Her number is [phone]. I left a message for her and asked her to call me back. TG

## 2013-05-31 NOTE — Telephone Encounter (Signed)
The appeal for use of IVIG was accepted and is being scanned into the chart.

## 2013-05-31 NOTE — Telephone Encounter (Signed)
I contacted the patient today and she has not received or IVIG because it has not been approved.  Apparently the material that was faxed on Friday was sitting on a machine and had not been attached to the claim.  I demanded that the decision the made today and that if it was denied, that I be called for a peer to peer discussion.  I made it clear that the patient could suffer harm if she was not able to receive her treatment today.

## 2013-06-04 ENCOUNTER — Telehealth: Payer: Self-pay

## 2013-06-04 DIAGNOSIS — G6181 Chronic inflammatory demyelinating polyneuritis: Secondary | ICD-10-CM

## 2013-06-04 NOTE — Telephone Encounter (Signed)
Lauren, mom, lvm Keturah Shavers for getting the PA so that child could have her infusion yesterday. She is asking that Otila Kluver send a referral to [facility]. Mom said that child is getting stronger and would like her legs evaluated  And have some testing done there. She has used [facility] in the past. :Marcille Buffy can be reached at [phone].

## 2013-06-04 NOTE — Telephone Encounter (Signed)
I called and lvm letting mom know.

## 2013-06-04 NOTE — Telephone Encounter (Signed)
The referral has been put into Epic. Please let Mom know that the scheduler from [facility] will contact her. Thanks, Otila Kluver

## 2013-06-09 ENCOUNTER — Ambulatory Visit: Payer: BC Managed Care – PPO | Attending: Family | Admitting: Physical Therapy

## 2013-06-09 DIAGNOSIS — G6181 Chronic inflammatory demyelinating polyneuritis: Secondary | ICD-10-CM | POA: Insufficient documentation

## 2013-06-09 DIAGNOSIS — R269 Unspecified abnormalities of gait and mobility: Secondary | ICD-10-CM | POA: Insufficient documentation

## 2013-06-09 DIAGNOSIS — M6281 Muscle weakness (generalized): Secondary | ICD-10-CM | POA: Insufficient documentation

## 2013-06-09 DIAGNOSIS — R293 Abnormal posture: Secondary | ICD-10-CM | POA: Insufficient documentation

## 2013-06-09 DIAGNOSIS — IMO0001 Reserved for inherently not codable concepts without codable children: Secondary | ICD-10-CM | POA: Insufficient documentation

## 2013-06-14 ENCOUNTER — Telehealth: Payer: Self-pay

## 2013-06-14 NOTE — Telephone Encounter (Signed)
Jessica Kelley, mom, lvm stating that child is scheduled to have surgery on 08/11/13 to lengthen her weak leg. Mom wanted to get with Dr.H to explain what's been going on with patient. She said that she was hoping to schedule appt w him before the surgery date, however, she was told by scheduling that Dr.H does not have anything available before the surgery date. Mom would like a call back at [phone].

## 2013-06-14 NOTE — Telephone Encounter (Signed)
I called and talked to Mom. She said that Jessica Kelley is planning to have leg lengthening surgery at [facility] in [place]. I told Mom that I would work on this and that we would get her in before March 25th. She has IVIG on Mon and Thurs so needs appts on days other than that. TG

## 2013-06-15 ENCOUNTER — Ambulatory Visit: Payer: BC Managed Care – PPO | Admitting: Physical Therapy

## 2013-06-15 NOTE — Telephone Encounter (Signed)
Dr Gaynell Face had a cancellation tomorrow. I called and offered it to Mom for Jessica Kelley but she could not accept the appointment. I will call her again when I have another opening. TG

## 2013-06-18 ENCOUNTER — Ambulatory Visit: Payer: BC Managed Care – PPO | Admitting: Physical Therapy

## 2013-06-18 NOTE — Telephone Encounter (Signed)
I called and left message offering appointment Feb 10 @ 3:30pm. Mom called back and left message accepting that appointment. TG

## 2013-06-21 ENCOUNTER — Other Ambulatory Visit: Payer: Self-pay | Admitting: "Endocrinology

## 2013-06-22 ENCOUNTER — Ambulatory Visit: Payer: BC Managed Care – PPO | Attending: Family | Admitting: Physical Therapy

## 2013-06-22 DIAGNOSIS — M6281 Muscle weakness (generalized): Secondary | ICD-10-CM | POA: Insufficient documentation

## 2013-06-22 DIAGNOSIS — R293 Abnormal posture: Secondary | ICD-10-CM | POA: Diagnosis not present

## 2013-06-22 DIAGNOSIS — R269 Unspecified abnormalities of gait and mobility: Secondary | ICD-10-CM | POA: Insufficient documentation

## 2013-06-22 DIAGNOSIS — G6181 Chronic inflammatory demyelinating polyneuritis: Secondary | ICD-10-CM | POA: Diagnosis not present

## 2013-06-22 DIAGNOSIS — IMO0001 Reserved for inherently not codable concepts without codable children: Secondary | ICD-10-CM | POA: Insufficient documentation

## 2013-06-24 ENCOUNTER — Ambulatory Visit: Payer: BC Managed Care – PPO | Admitting: Physical Therapy

## 2013-06-29 ENCOUNTER — Ambulatory Visit: Payer: BC Managed Care – PPO | Admitting: Physical Therapy

## 2013-06-29 ENCOUNTER — Encounter: Payer: Self-pay | Admitting: Pediatrics

## 2013-06-29 ENCOUNTER — Ambulatory Visit (INDEPENDENT_AMBULATORY_CARE_PROVIDER_SITE_OTHER): Payer: BC Managed Care – PPO | Admitting: Pediatrics

## 2013-06-29 VITALS — BP 102/70 | HR 84 | Ht 64.0 in | Wt 110.0 lb

## 2013-06-29 DIAGNOSIS — M4150 Other secondary scoliosis, site unspecified: Secondary | ICD-10-CM

## 2013-06-29 DIAGNOSIS — R269 Unspecified abnormalities of gait and mobility: Secondary | ICD-10-CM

## 2013-06-29 DIAGNOSIS — G6181 Chronic inflammatory demyelinating polyneuritis: Secondary | ICD-10-CM

## 2013-06-29 DIAGNOSIS — E109 Type 1 diabetes mellitus without complications: Secondary | ICD-10-CM

## 2013-06-29 DIAGNOSIS — E063 Autoimmune thyroiditis: Secondary | ICD-10-CM

## 2013-06-29 DIAGNOSIS — IMO0001 Reserved for inherently not codable concepts without codable children: Secondary | ICD-10-CM | POA: Diagnosis not present

## 2013-06-29 NOTE — Progress Notes (Signed)
Patient: Jessica Kelley MRN: [id number] Sex: female DOB: 1990-05-31  Provider: Jodi Geralds, MD Location of Care: [facility]  Note type: Routine return visit  History of Present Illness: Referral Source: Dr. Garnet Koyanagi History from: mother, patient and [facility] chart Chief Complaint: Discuss leg lengthening surgery, and follow up CIDP  Jessica Kelley is a 23 y.o. female who returns for evaluation and management of chronic inflammatory demyelinating polyneuropathy.  The patient returns on June 29, 2013 for the first time since November 26, 2012.  She has chronic inflammatory demyelinating polyneuropathy.  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  She has responded somewhat to immune therapy, but has overtime lost strength.  She has type 1 diabetes mellitus, Hashimoto's thyroiditis, celiac disease, neuromuscular scoliosis, dry eye syndrome, and Wyburn-Mason malformation of the left, retina with glaucoma.  After trying plasma exchange at [facility] the patient felt that she was losing strength and return at [facility].  Dr. Lerry Paterson strongly recommended returning to IVIG and methylprednisolone.  She receives three treatments of IVIG 50 g each week and 500 mg of Solu-Medrol once a week.  She feels that her arms are stronger since last visit. I think that, that may be true.  Her legs are not stronger.  She was thinking of having a right leg lengthening procedure to lessen her back pain though she and her mother developed second thoughts and that procedure is on hold.  Review of Systems: 12 system review was unremarkable  Past Medical History  Diagnosis Date  . Diabetes type I     Dr. Tobe Sos  . Celiac disease   . Macular degeneration   . Hypothyroidism   . Wyburn-Mason syndrome   . Glaucoma   . Scoliosis   . CIDP (chronic inflammatory demyelinating polyneuropathy) 2001  . Pilonidal cyst    Hospitalizations: yes,  Head Injury: yes, Nervous System Infections: no, Immunizations up to date: yes Past Medical History Comments: See Hx. She had a head injury December 2013  Birth History 7 pound 9 ounce infant born at full-term.  Gestation was uncomplicated.  Labor lasted 23 hours.  Operative delivery with forceps.  Nursery course was uneventful.  Growth and development is recalled as normal.  Behavior History none  Surgical History Past Surgical History  Procedure Laterality Date  . Cataract extraction  06/25/06  . Glaucoma surgery  02/12/06  . Foot arthroplasty      left foot moved and stretched tendons   . Hip arthroplasty      right never bipopsy  . Portacath placement  2002-2004  . Removal of vascular port    . Sciatic nerve muscle biopsy      Family History family history includes Cancer in her maternal grandfather and paternal grandmother; Coronary artery disease in her paternal grandfather; Diabetes in her sister; Hyperlipidemia in an other family member; Other in her sister; Prostate cancer in her maternal grandfather; Thyroid disease in her mother. Family History is negative migraines, seizures, cognitive impairment, blindness, deafness, birth defects, chromosomal disorder, autism.  Social History History   Social History  . Marital Status: Single    Spouse Name: N/A    Number of Children: N/A  . Years of Education: N/A   Social History Main Topics  . Smoking status: Never Smoker   . Smokeless tobacco: Never Used     Comment: passive smoke exposure  . Alcohol Use: No  . Drug Use: No  . Sexual Activity: None  Other Topics Concern  . None   Social History Narrative  . None   Educational level 12th grade Occupation:  Living with both parents  Hobbies/Interest: Music School comments Peggye attended [employer] 2010-2011. 2012-2013 she was home schooled. She will be working with her father at [employer] in the near future.  Current Outpatient Prescriptions on File  Prior to Visit  Medication Sig Dispense Refill  . Cholecalciferol (VITAMIN D) 1000 UNITS capsule Take 1,000 Units by mouth daily.        . Cyanocobalamin (VITAMIN B-12 PO) Take by mouth.      . Ferrous Sulfate (RA IRON) 27 MG TABS Take by mouth 2 (two) times daily.        Marland Kitchen gabapentin (NEURONTIN) 100 MG capsule TAKE TWO CAPSULES BY MOUTH EVERY DAY AT  8  AM,  THEN  TAKE  TWO  CAPS  AT  4  PM,  THEN  TAKE  THREE  CAPS  AT  11  PM  217 capsule  3  . glucagon 1 MG injection Follow package directions for low blood sugar.  2 each  4  . Immune Globulin, Human, (GAMMAGARD IV) Inject into the vein. Give 50 grams x 2 days per week each week Given by [place]      . insulin aspart (NOVOLOG) 100 UNIT/ML injection Use with insulin pump, dispense 4 vials  4 vial  6  . methimazole (TAPAZOLE) 5 MG tablet Take two methimazole 5 mg tablets (50m) in am and one tablet methimazole 547min pm.  90 tablet  6  . methylPREDNISolone sodium succinate (SOLU-MEDROL) 500 MG injection Inject into the vein. Give 50044mV in 50 ml NS over 1 hour x 1 every 4 weeks. Given by [facility]   . mycophenolate (CELLCEPT) 500 MG tablet Take 2 tablets (1,000 mg total) by mouth 2 (two) times daily.  360 tablet  3  . predniSONE (DELTASONE) 20 MG tablet TAKE 1 TABLET BY MOUTH ON THE DAY BEFORE, DAY OF, AND DAY AFTER IVIG. ATTEMPT TO DISCONTINUE THIS IN ONE MONTH  30 tablet  0  . propranolol (INDERAL) 20 MG tablet TAKE ONE TABLET BY MOUTH THREE TIMES DAILY  90 tablet  0  . sodium chloride 0.9 % solution Inject into the vein continuous.        . iMarland Kitchenuprofen (ADVIL,MOTRIN) 200 MG tablet Take 4 tablets at onset of headache, may repeat every 4-6 hours PRN       No current facility-administered medications on file prior to visit.   The medication list was reviewed and reconciled. All changes or newly prescribed medications were explained.  A complete medication list was provided to the patient/caregiver.  Allergies   Allergen Reactions  . Gluten Meal   . Wheat Bran     Physical Exam BP 102/70  Pulse 84  Ht 5' 4"  (1.626 m)  Wt 110 lb (49.896 kg)  BMI 18.87 kg/m2  LMP 06/20/2013  General: alert, well developed, well nourished, in no acute distress, blond hair, blue eyes, left-handed  Head: normocephalic, no dysmorphic features  Ears, Nose and Throat: Otoscopic: tympanic membranes normal . Pharynx: oropharynx is pink without exudates or tonsillar hypertrophy.  Neck: supple, full range of motion, no cranial or cervical bruits  Respiratory: auscultation clear  Cardiovascular: no murmurs, pulses are normal  Musculoskeletal: the patient has some atrophy of her right leg, distal atrophy in her calves, and convex right scoliosis that is mild  Skin: no rashes or neurocutaneous lesions   Neurologic Exam   Mental Status: alert; oriented to person, place, and year; knowledge is normal for age; language is normal  Cranial Nerves: visual fields are full to double simultaneous stimuli; extraocular movements are full and conjugate; pupils are round reactive to light with a left afferent pupillary defect, left iridectomy with lens implant; funduscopic examination shows sharp disc margins with normal vessels on the right and dilated vessels 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  Motor: normal strength in her neck except extensors 4+. Deltoid 4 right, 3 left, biceps 4+ right, 4 left, triceps 4+ right, 4 left, wrist extensors 4+ right, 5- left, wrist flexors 4+ bilaterally, grip 4+ right 5 left, finger extensors 4-4+ bilaterally,Supraspinatous 4 right, 4- left, pronator 4+ bilaterally, supinator 4 bilaterally, pectoralis 5 bilaterally, hip flexors 2 right, 3 left , hip adductors 0 right, 2 left, hip abductors 0 right 2 left; knee flexors 1 right, 3 left; knee extensors 0  right, 3 left, foot dorsiflexors and plantar flexors 0-1 right, 3 left, toes 0 right, 3 left.  Sensory: No significant peripheral polyneuropathy to cold or vibration. Proprioception appears intact The patient had normal stereognosis. Coordination: good finger-to-nose, rapid repetitive alternating movements and finger apposition  Gait and Station: diplegia gait with waddling, steppage right greater than left, external rotation of her legs, broad-based, arms extended out from her side for balance; unable to walk on her heels or toes, perform tandem Gower response was not attempted, Unable to walk unless she grasps her right cane and someone holds onto her left arm for balance.  Reflexes: symmetric and absent bilaterally; no clonus; bilateral flexor plantar responses.  Assessment 1.  Chronic inflammatory demyelinating polyneuropathy 357.81.  2.  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  3.  Migraine without aura 346.10.  4.  Chronic tension-type headache 339.12.  5.  Insomnia 780.52.  6.  Chronic lymphocytic thyroiditis 245.2.  7.  Scoliosis without other condition.  8.  Type 1 diabetes mellitus 250.01.  Discussion The patient otherwise seems stable with her multiple medical problems.  She is hoping to go to [facility] this spring to stay for couple months with her sister.  I strongly recommended that we determine a physician in [place] who can take care of her if she has exacerbation of her symptoms while she is there.  We also will need to transfer her infusions of IVIG and Solu-Medrol to [facility] for that time and we will need to have a local physician prescribe that.  Until we can be certain that care is going to be in place, I will not advise her to go to [facility].  I will plan to see her in six months' time sooner depending upon clinical need.    I spent 30 minutes of face-to-face time with the patient and her mother more than half of it in consultation.  Jodi Geralds MD

## 2013-06-30 ENCOUNTER — Ambulatory Visit: Payer: BC Managed Care – PPO | Admitting: "Endocrinology

## 2013-07-01 ENCOUNTER — Encounter: Payer: Self-pay | Admitting: Pediatrics

## 2013-07-02 ENCOUNTER — Ambulatory Visit: Payer: BC Managed Care – PPO | Admitting: Physical Therapy

## 2013-07-06 ENCOUNTER — Ambulatory Visit: Payer: BC Managed Care – PPO | Admitting: Physical Therapy

## 2013-07-08 ENCOUNTER — Ambulatory Visit: Payer: BC Managed Care – PPO | Admitting: Physical Therapy

## 2013-07-09 LAB — HEMOGLOBIN A1C
Hgb A1c MFr Bld: 11.4 % — ABNORMAL HIGH (ref <5.7)
Mean Plasma Glucose: 280 mg/dL — ABNORMAL HIGH (ref <117)

## 2013-07-10 LAB — T3, FREE: T3, Free: 3.3 pg/mL (ref 2.3–4.2)

## 2013-07-10 LAB — T4, FREE: Free T4: 0.88 ng/dL (ref 0.80–1.80)

## 2013-07-10 LAB — TSH: TSH: 0.651 u[IU]/mL (ref 0.350–4.500)

## 2013-07-12 ENCOUNTER — Ambulatory Visit (INDEPENDENT_AMBULATORY_CARE_PROVIDER_SITE_OTHER): Payer: BC Managed Care – PPO | Admitting: "Endocrinology

## 2013-07-12 ENCOUNTER — Encounter: Payer: Self-pay | Admitting: "Endocrinology

## 2013-07-12 VITALS — BP 116/75 | HR 97 | Wt 117.0 lb

## 2013-07-12 DIAGNOSIS — R000 Tachycardia, unspecified: Secondary | ICD-10-CM | POA: Insufficient documentation

## 2013-07-12 DIAGNOSIS — R5383 Other fatigue: Secondary | ICD-10-CM

## 2013-07-12 DIAGNOSIS — E063 Autoimmune thyroiditis: Secondary | ICD-10-CM

## 2013-07-12 DIAGNOSIS — IMO0002 Reserved for concepts with insufficient information to code with codable children: Secondary | ICD-10-CM

## 2013-07-12 DIAGNOSIS — G6181 Chronic inflammatory demyelinating polyneuritis: Secondary | ICD-10-CM

## 2013-07-12 DIAGNOSIS — E1169 Type 2 diabetes mellitus with other specified complication: Secondary | ICD-10-CM

## 2013-07-12 DIAGNOSIS — E049 Nontoxic goiter, unspecified: Secondary | ICD-10-CM

## 2013-07-12 DIAGNOSIS — E1143 Type 2 diabetes mellitus with diabetic autonomic (poly)neuropathy: Secondary | ICD-10-CM

## 2013-07-12 DIAGNOSIS — E0500 Thyrotoxicosis with diffuse goiter without thyrotoxic crisis or storm: Secondary | ICD-10-CM

## 2013-07-12 DIAGNOSIS — I498 Other specified cardiac arrhythmias: Secondary | ICD-10-CM

## 2013-07-12 DIAGNOSIS — R5381 Other malaise: Secondary | ICD-10-CM

## 2013-07-12 DIAGNOSIS — E11649 Type 2 diabetes mellitus with hypoglycemia without coma: Secondary | ICD-10-CM

## 2013-07-12 DIAGNOSIS — E1065 Type 1 diabetes mellitus with hyperglycemia: Secondary | ICD-10-CM

## 2013-07-12 DIAGNOSIS — E1149 Type 2 diabetes mellitus with other diabetic neurological complication: Secondary | ICD-10-CM

## 2013-07-12 DIAGNOSIS — G909 Disorder of the autonomic nervous system, unspecified: Secondary | ICD-10-CM

## 2013-07-12 DIAGNOSIS — I4711 Inappropriate sinus tachycardia, so stated: Secondary | ICD-10-CM | POA: Insufficient documentation

## 2013-07-12 HISTORY — DX: Inappropriate sinus tachycardia, so stated: I47.11

## 2013-07-12 LAB — GLUCOSE, POCT (MANUAL RESULT ENTRY): POC Glucose: 221 mg/dl — AB (ref 70–99)

## 2013-07-12 LAB — THYROID STIMULATING IMMUNOGLOBULIN: TSI: 35 % baseline (ref <140)

## 2013-07-12 NOTE — Patient Instructions (Signed)
Follow up in 2 months. On days of IVIG/steroids, set a temporary basal rate for 8 hours at 130%. Repeat every 8 hours for as often as needed for up to 48 hours after taking the IVIG/steroids.

## 2013-07-12 NOTE — Progress Notes (Signed)
CHIEF COMPLAINT: The patient presents for follow-up of type 1 diabetes mellitus, chronic inflammatory demyelinating polyneuropathy (CIDP), celiac disease, muscle weakness, limited endurance, goiter, thyroiditis, inappropriate sinus tachycardia, autonomic neuropathy, hypoglycemia, hyperthyroidism secondary to Graves' disease, hyperkalemia, anemia, iron deficiency, hypothyroid secondary to Hashimoto's thyroiditis, and fatigue.  HISTORY OF PRESENT ILLNESS: The patient is a 23 year-old Caucasian young woman. The patient was accompanied by her mother.  1. This very complicated 23 year-old white female patient was first referred to me on 09/25/06 by her primary care provider, Dr. Garnet Koyanagi, for evaluation and management of type I diabetes mellitus, hypoglycemia, and a myriad of other autoimmune issues.   A. CIDP: At about age 23 the child began to limp.  At age 23 she was evaluated at [facility] and diagnosed with CIDP. At the time I first met her she was under the care of Dr. Marcene Brawn of [facility] in [place]. She was on a regular regimen of intravenous treatments at home with IVIG and steroids on the days of therapy. The IVIG treatments were given monthly. She was also on cyclosporine A daily, alternating doses every other day. Because of the CIDP she had  significant problems with muscle atrophy and weakness, as well as multiple orthopedic issues. She was also being followed by Dr. Simonne Come, an orthopedist in [place]. On that first visit her legs and arms were quite weak. She found it very hard to walk or to exercise.  B. Type 1 diabetes mellitus: Her diabetes mellitus was also diagnosed at age 22, just prior to the diagnosis of CIDP. She had been started on a Medtronic Paradigm 712 insulin pump subsequently and was using Humalog lispro insulin in her pump. She had a very difficult time with blood glucose control. At the time of her steroids, her sugars tended to run very high.  However, when she was able to perform some physical activities, she tended to develop hypoglycemia. Her hemoglobin A1c on that first visit was 9.5%.  C. Celiac disease: The patient was diagnosed with celiac disease at approximately age 22. She had been on a gluten-free diet ever since.  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 She was followed both at [facility] and [facility].  2. Since that initial visit with me, I have followed her for the problems listed above plus other new problems that have developed over time.   A. CIDP: Her CIDP course has waxed and waned. At one point she had improved significantly, her medication doses had been reduced, she felt very much stronger, had more stamina, and was doing very well. Unfortunately, she later had a significant flare-up of CIDP and lost much of the improvement that she had gained. Since going back to [facility] in the spring of 2013, however, and starting a new and more intensified IVIG regimen, her strength had improved. She was still very weak physically and muscularly, however, and needed support to walk. As noted below, after tapering her steroids recently, her weakness had worsened.  B. Type 1 diabetes mellitus: The patient's blood sugars have varied significantly with the course of her CIDP and the treatments for that disease. Her hemoglobin A1c values have ranged from a low of 8.3% to a high of 14.0%. Most of her A1c values have been in the 8.4-9.6 range.   C. Celiac disease: The patient has really done quite  well over the years. She rarely has signs or symptoms of abdominal problems. Because her antibody levels fluctuate a great deal, her celiac disease antibodies were negative in March 2012. She was mistakenly told that she did not have celiac disease and that she could resume a normal diet. As expected,  resumption of a normal diet caused a recurrence of active celiac disease. In October 2012 she saw a gastroenterologist, Dr. Gale Journey, at [facility] who told her that she did have celiac disease. She is now back on her gluten-free diet and is asymptomatic again.  D. Autoimmune thyroid disease: The patient had a goiter and a TPO antibody level of 287.2 on her first visit in May of 2008, consistent with evolving Hashimoto's Disease. She occasionally had the sensation of swelling and discomfort in her anterior neck, which was also c/w flare-ups of Hashimoto's Disease. In early 2009 she developed increased tachycardia. Lab tests performed in March 2000 by her primary care provider showed a TSH of 0.01 and a T4 of 7.7. It was felt that the increase in tachycardia was likely due to a combination of autonomic neuropathy and Hashitoxicosis.  However on follow-up laboratory tests performed in July, her TSH was 0.05, free T4 3.9, and free T3 14.2. Her TSI level done on the assay used at the time was 2.2, with normal being 1.0 or less. It was then evident that she had developed Graves' Disease in the setting of pre-existing Hashimoto's Disease.  I initiated treatment with methimazole, 10 mg twice daily and propranolol 20 mg 3 times daily. Since then we've seen significant swings of her thyroid function tests, partly due to changes in methimazole doses, but also partly due to changes in her immunosuppressant therapies. During the last 2 years her TSH values have ranged from a low of 0.1432 to a high of 14.933. The dose of methimazole has been adjusted as needed to treat her Graves' disease. It has been my hope that her Hashimoto's disease will eventually cancel out her Graves' disease.   3.The patient's last [facility] visit was on 10/27/12. In the interim, she saw Dr. Barbarann Ehlers at [facility] in July. He felt that the trial of plasmapheresis had not been successful. He discontinued the plasmapheresis and put her back on IVIG and steroids as  before. She takes IVIG and steroids every Monday and either Thursdays or Fridays. Since then her strength has been improving, but she still needs physical assistance with getting up from a chair and with walking. She takes methimazole, 5 mg each morning and 2.5 mg each evening. She stopped lisinopril 2.5 mg daily due to concerns about having low BP. She is also taking propranolol, ranitidine, gabapentin, iron, and vitamin D. She has a good appetite. Although weak, she has been getting out a fair amount. 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 Her headaches have decreased in frequency. Dr. Gaynell Face is now the neurologist caring for her CIDP.  4. Pertinent Review of Systems: Constitutional: The patient says "I feel good." She still tires easily, but is better. Her strength/weakness are better. Her stamina is better.  Eyes: Her contact lenses are helping. Last eye exam was in June or July of last year. There were no signs of diabetes.  Neck: The patient has had no problems with anterior neck swelling and tenderness for a year or more.   Heart: The patient  is not aware of her heart beating fast. The patient has no complaints of palpitations, irregular heart beats, chest pain, or chest pressure.   Gastrointestinal: Bowel movents seem normal. The patient has no complaints of excessive hunger, acid reflux, upset stomach, stomach aches or pains, diarrhea, or constipation.  Legs: Muscle mass and strength seem to be improved. There are few complaints of numbness, tingling, burning, or pain. No edema is noted.  Feet: There are no obvious new foot problems. There are no complaints of numbness, tingling, burning, or pain. No edema is noted. Neurologic: She requires somewhat less assistance with walking than she did at last visit. Sensation seems to be normal. Coordination is not very good. GYN: Her LMP was two-three  weeks ago.  Menses have been more regular lately.   Hypoglycemia: She has hypoglycemia occasionally, usually about 11 PM.   5. Blood glucose printout: She says that she changes her pump site every 3-4 days. She checks BGs 4-6 times per day. She boluses 3-9 times daily. BGs varied from 61 to >400. Her average BG is 267, compared with 271 at last visit. She has had 13 >400 readings. When she takes IVIG and steroids on Mondays her BGs are elevated for about 36 hours, sometimes for up to 48 hours.   PAST MEDICAL, FAMILY, AND SOCIAL HISTORY: 1. School: The patient finished high school. She is not taking any college courses on line now. She will visit her sister in [place] from May to July.      2. Activities: She has not tried to play the drums. She will have follow up at [facility] annually.   3. Smoking, alcohol, or drugs: None 4. Primary Care Provider: Dr. Garnet Koyanagi at [facility]. 5. Neurologist; Dr. Gaynell Face  REVIEW OF SYSTEMS: There are no other significant problems involving the patient's other body systems.  PHYSICAL EXAM: BP 116/75  Pulse 97  Wt 117 lb (53.071 kg)  LMP 06/20/2013 She is alert, but her affect is fairly flat. She has gained 4 pounds since last visit. Her HR has decreased from 114 at last visit.  Constitutional: This patient appears reasonably healthy, but still fairly weak. She needs the help of her mother to stand from a sitting position and to walk. Her mother remained in the background for today's visit, allowing Numa full opportunity to have as independent a visit as possible. Head: The head is normocephalic. Face: The face appears normal. There are no obvious dysmorphic features. Eyes: The eyes appear to be normally formed and spaced. Gaze is conjugate. There is no obvious arcus or proptosis. Moisture appears normal. Mouth: The oropharynx and tongue appear normal. Dentition appears to be normal for age. Oral moisture is normal. Neck: On inspection the thyroid gland is  enlarged on the right side. No carotid bruits are noted. The thyroid gland is again enlarged at 25 grams in size, but the lobes have changed in size. The gland is asymmetric in that the left lobe is only mildly enlarge today, while the right lobe is much larger and firmer than at last visit.  The thyroid gland is not tender to palpation. Lungs: The lungs are clear to auscultation. Air movement is good. Heart: Heart rate and rhythm are regular. Heart sounds S1 and S2 are normal. I did not appreciate any pathologic cardiac murmurs. Abdomen: The abdomen is normal in size for the patient's age. Bowel sounds are normal. There is no obvious hepatomegaly, splenomegaly, or other mass effect.  Arms: Muscle size and bulk  are low-normal for age. Hands: There is a slight tremor today. Phalangeal and metacarpophalangeal joints are normal. Palmar muscles are low-normal for age. Palmar skin is normal. Palmar moisture is also normal. Legs: Muscles appear low-normal for age. No edema is present. Feet: Her feet are cool. Dorsalis pedal pulses are faint 1+ on the right and 1-2+ on the left.    Neurologic: Strength is 4-5/5 for age in the upper extremities and 3/5 in the right leg and 4+/5 in the left leg. Muscle tone is somewhat low. Sensation to touch is decreased in the right leg, but normal in both feet.    LAB DATA:  07/09/13; HbA1c was 11.4%, compared with 9.1% at last visit and with 14.0% at the visit prior. TSH was 0.652, free T4 0.88, free T3 3.3  10/06/12: TSH 0.088, free T4 1.28, free T3 3.1  02/25/12: TSH 1.044, free T4 1.45, free T3 3.4  06/14/11: TSH was 1.633. Free T4 1.47. Free T3 was 3.1. TSI was 56 (normal less than 120).                       ASSESSMENT: 1. Type 1 diabetes mellitus: By HbA1c, the BG control is much worse. Since resuming IVIG and steroids twice weekly her BGs are higher. She needs more insulin for 36-48 hours after taking steroids. Overall she is much more compliant with BG checks  and insulin boluses. I wish that she had called me when she resumed the steroids and that she had made the appointment for a 57-monthfollow up visit as I'd requested.  2. Hypoglycemia: Occasional, but infrequent    3. Chronic inflammatory demyelinating polyneuropathy (CIDP) and muscle weakness: She feels better subjectively since resuming her IVIG/steroid regimen.    4. Autoimmune thyroid disease: The patient was hyperthyroid in September. We have increased her MTZ to 7.5 mg/day since then. She appears clinically euthyroid today or perhaps mildly hypothyroid. The pattern of her TFTs indicates that she has had a relatively recent episode of thyroiditis.  We'll repeat her TFTs in 6 weeks.   5. Goiter: Thyroid is about the same size, but now the left lobe is smaller and the right lobe is larger. The waxing and waning of thyroid lobe size is c/w evolving Hashimoto's disease.  6. Autonomic neuropathy with tachycardia: The heart rate is still high, c/w poor BG control.  7. Hashimoto's thyroiditis: The patient's thyroiditis is clinically quiescent at this time. As noted above she has had a recent flare up of thyroiditis. I expect that as her Hashimoto's disease progressively destroys more thyroid cells, we will be able to taper her methimazole to zero. She will eventually be permanently hypothyroid and be on Synthroid hormone replacement. 8. Fatigue and decreased stamina: These problems have probably improved somewhat on her new regimen.   9. Thyrotoxicosis: Her diffuse thyrotoxicosis, secondary to Graves' disease, has waxed and waned over time.    10. Celiac disease: Patient is back on her gluten-free diet. She is asymptomatic.  11. Emotionality: The patient is probably doing better.  PLAN: 1. Diagnostic: She will have TFTs and TSI in 2 months. Bring in pump and meter for download in one month.    2. Therapeutic:  A. Continue current basal rates as follows:   MN: 1.00 -> 1.10 7 AM: 1.00 -> 1.10 Noon:  0.950 -> 1.250 6:30 PM: 1.00-> 1.25. B. On the day of IVIG and steroids,use a temporary basal rate of 130% for 8 hours, repeating every  8 hours for up to 36 hours as needed. 3. Patient education: We discussed the issue of using an increased temporary basal rate for 36-48 hours after taking steroids.She needs to be as consistent as possible with pump site changes, BG checks, and insulin boluses.  The more consistent she can be, the fewer variables we will have to deal with.  4. Follow-up: 2-1/2 months  Level of Service: This visit lasted in excess of 60 minutes. More than 50% of the visit was devoted to counseling.  Sherrlyn Hock

## 2013-07-13 ENCOUNTER — Ambulatory Visit: Payer: BC Managed Care – PPO | Admitting: Physical Therapy

## 2013-07-15 ENCOUNTER — Ambulatory Visit: Payer: BC Managed Care – PPO | Admitting: Physical Therapy

## 2013-07-17 ENCOUNTER — Telehealth: Payer: Self-pay | Admitting: "Endocrinology

## 2013-07-17 NOTE — Telephone Encounter (Signed)
Received telephone call from mom and Lace. 1. Overall status: She did some walking around. No known exposure to stomach flu. Her last site change was yesterday. Her last steroid dose was on Monday. She takes IVIG on both Monday and Thursday, but only takes steroids on Mondays. She had popcorn at the movies. Later she had dinner. She has had nausea all night tonight since supper..  2. New problems: BG dropped to 50 at 8 PM. She had several pop tarts. BG drooped to 82 and 67. She took more BGs and BG increased to 94. Her pump is off.   3. Rapid-acting insulin: Novolog in pump 5. BG log: 2 AM, Breakfast, Lunch, Supper, Bedtime 6. Assessment: She may be developing a gastroenteritis. Or, she may have some degree of partial adrenal insufficiency. Since this is the only time she's had these symptoms, adrenal insufficiency is less likely. 7. Plan: Temporary basal rate of 70% for 4 hours. At 2 AM, if she thinks that she still needs the temporary basal rate, continue for next 8 hours. Also check for ketones. Schedule an ACTH stimulation test for next Friday morning.  8. FU call: If she can't keep her BGs up, go to the ED. Call tomorrow evening.  Sherrlyn Hock

## 2013-07-19 ENCOUNTER — Telehealth: Payer: Self-pay | Admitting: *Deleted

## 2013-07-19 ENCOUNTER — Telehealth: Payer: Self-pay | Admitting: "Endocrinology

## 2013-07-19 NOTE — Telephone Encounter (Signed)
PATIENT'S MOTHER SAYS PATIENT IS STILL HAVING SOME LOWS AND SOME HIGHS, AND SHE FEELS THAT PATIENT DOES NEED ADRENAL GLAND TEST.  PLEASE CALL TO SCHEDULE.

## 2013-07-19 NOTE — Telephone Encounter (Signed)
1. Mom called to say that Jessica Kelley is still having a few low BGs, but not as bad. Jessica Kelley did not have any additional GI problems, except some abdominal pains that have resolved. Mom does not think that Jessica Kelley has deviated from her gluten-free diet. While mom thinks that she may have had a problem with bad popcorn, mom would still like to have the ACTH stim test.performed.  2. I told mom that I will put in an order for an ACTH stim test tomorrow. Once we fax that order to the short stay section, mom can call Short Stay Section B and schedule the test. Sherrlyn Hock

## 2013-07-20 ENCOUNTER — Other Ambulatory Visit: Payer: Self-pay | Admitting: *Deleted

## 2013-07-20 DIAGNOSIS — E1065 Type 1 diabetes mellitus with hyperglycemia: Secondary | ICD-10-CM

## 2013-07-20 DIAGNOSIS — IMO0002 Reserved for concepts with insufficient information to code with codable children: Secondary | ICD-10-CM

## 2013-07-20 MED ORDER — INSULIN ASPART 100 UNIT/ML ~~LOC~~ SOLN: SUBCUTANEOUS | Status: DC

## 2013-07-28 ENCOUNTER — Other Ambulatory Visit (HOSPITAL_COMMUNITY): Payer: Self-pay | Admitting: *Deleted

## 2013-07-30 ENCOUNTER — Ambulatory Visit (HOSPITAL_COMMUNITY)
Admission: RE | Admit: 2013-07-30 | Discharge: 2013-07-30 | Disposition: A | Discharge status: Home / Self Care | Payer: BC Managed Care – PPO | Source: Ambulatory Visit | Attending: Ophthalmology | Admitting: Ophthalmology

## 2013-07-30 DIAGNOSIS — E2749 Other adrenocortical insufficiency: Secondary | ICD-10-CM | POA: Insufficient documentation

## 2013-07-30 MED ORDER — COSYNTROPIN 0.25 MG IJ SOLR
0.2500 mg | Freq: Once | INTRAMUSCULAR | Status: AC
  Administered 2013-07-30: 0.25 mg via INTRAVENOUS
  Filled 2013-07-30: qty 0.25

## 2013-07-30 MED ORDER — SODIUM CHLORIDE 0.9 % IV SOLN
Freq: Once | INTRAVENOUS | Status: AC
  Administered 2013-07-30: 10:00:00 via INTRAVENOUS

## 2013-07-31 ENCOUNTER — Other Ambulatory Visit: Payer: Self-pay | Admitting: "Endocrinology

## 2013-07-31 LAB — ACTH STIMULATION, 3 TIME POINTS
Cortisol, 30 Min: 21.1 ug/dL (ref >20.0)
Cortisol, 60 Min: 22.2 ug/dL (ref >20)
Cortisol, Base: 9.6 ug/dL

## 2013-08-02 LAB — ACTH: C206 ACTH: 15 pg/mL (ref 6–50)

## 2013-08-03 ENCOUNTER — Other Ambulatory Visit: Payer: Self-pay | Admitting: Family

## 2013-08-03 DIAGNOSIS — G6181 Chronic inflammatory demyelinating polyneuritis: Secondary | ICD-10-CM

## 2013-08-03 MED ORDER — METHYLPREDNISOLONE SODIUM SUCC 500 MG IJ SOLR: INTRAMUSCULAR | Status: DC

## 2013-08-04 ENCOUNTER — Ambulatory Visit: Payer: BC Managed Care – PPO | Attending: Family | Admitting: Physical Therapy

## 2013-08-04 ENCOUNTER — Encounter: Payer: Self-pay | Admitting: *Deleted

## 2013-08-04 DIAGNOSIS — G6181 Chronic inflammatory demyelinating polyneuritis: Secondary | ICD-10-CM | POA: Insufficient documentation

## 2013-08-04 DIAGNOSIS — R293 Abnormal posture: Secondary | ICD-10-CM | POA: Insufficient documentation

## 2013-08-04 DIAGNOSIS — R269 Unspecified abnormalities of gait and mobility: Secondary | ICD-10-CM | POA: Insufficient documentation

## 2013-08-04 DIAGNOSIS — M6281 Muscle weakness (generalized): Secondary | ICD-10-CM | POA: Diagnosis not present

## 2013-08-04 DIAGNOSIS — IMO0001 Reserved for inherently not codable concepts without codable children: Secondary | ICD-10-CM | POA: Diagnosis not present

## 2013-08-10 ENCOUNTER — Ambulatory Visit: Payer: BC Managed Care – PPO | Admitting: Physical Therapy

## 2013-08-10 DIAGNOSIS — IMO0001 Reserved for inherently not codable concepts without codable children: Secondary | ICD-10-CM | POA: Diagnosis not present

## 2013-08-12 ENCOUNTER — Ambulatory Visit: Payer: BC Managed Care – PPO | Admitting: Physical Therapy

## 2013-08-17 ENCOUNTER — Ambulatory Visit: Payer: BC Managed Care – PPO | Admitting: Physical Therapy

## 2013-08-17 DIAGNOSIS — IMO0001 Reserved for inherently not codable concepts without codable children: Secondary | ICD-10-CM | POA: Diagnosis not present

## 2013-08-19 ENCOUNTER — Ambulatory Visit: Payer: BC Managed Care – PPO | Attending: Family | Admitting: Physical Therapy

## 2013-08-19 DIAGNOSIS — R293 Abnormal posture: Secondary | ICD-10-CM | POA: Insufficient documentation

## 2013-08-19 DIAGNOSIS — R269 Unspecified abnormalities of gait and mobility: Secondary | ICD-10-CM | POA: Diagnosis not present

## 2013-08-19 DIAGNOSIS — IMO0001 Reserved for inherently not codable concepts without codable children: Secondary | ICD-10-CM | POA: Insufficient documentation

## 2013-08-19 DIAGNOSIS — G6181 Chronic inflammatory demyelinating polyneuritis: Secondary | ICD-10-CM | POA: Insufficient documentation

## 2013-08-19 DIAGNOSIS — M6281 Muscle weakness (generalized): Secondary | ICD-10-CM | POA: Insufficient documentation

## 2013-08-24 ENCOUNTER — Other Ambulatory Visit: Payer: Self-pay | Admitting: Family

## 2013-08-24 ENCOUNTER — Ambulatory Visit: Payer: BC Managed Care – PPO | Admitting: Physical Therapy

## 2013-08-24 ENCOUNTER — Other Ambulatory Visit: Payer: Self-pay | Admitting: Neurology

## 2013-08-24 DIAGNOSIS — IMO0001 Reserved for inherently not codable concepts without codable children: Secondary | ICD-10-CM | POA: Diagnosis not present

## 2013-08-26 ENCOUNTER — Ambulatory Visit: Payer: BC Managed Care – PPO | Admitting: Physical Therapy

## 2013-08-26 DIAGNOSIS — IMO0001 Reserved for inherently not codable concepts without codable children: Secondary | ICD-10-CM | POA: Diagnosis not present

## 2013-08-31 ENCOUNTER — Other Ambulatory Visit: Payer: Self-pay | Admitting: *Deleted

## 2013-08-31 ENCOUNTER — Ambulatory Visit: Payer: BC Managed Care – PPO | Admitting: Physical Therapy

## 2013-08-31 DIAGNOSIS — E038 Other specified hypothyroidism: Secondary | ICD-10-CM

## 2013-09-02 ENCOUNTER — Ambulatory Visit: Payer: BC Managed Care – PPO | Admitting: Physical Therapy

## 2013-09-07 ENCOUNTER — Ambulatory Visit: Payer: BC Managed Care – PPO | Admitting: Physical Therapy

## 2013-09-09 ENCOUNTER — Ambulatory Visit: Payer: BC Managed Care – PPO | Admitting: "Endocrinology

## 2013-09-14 ENCOUNTER — Ambulatory Visit: Payer: BC Managed Care – PPO | Admitting: Physical Therapy

## 2013-09-16 ENCOUNTER — Ambulatory Visit: Payer: BC Managed Care – PPO | Admitting: Physical Therapy

## 2013-10-13 ENCOUNTER — Other Ambulatory Visit: Payer: Self-pay | Admitting: Neurology

## 2013-10-14 NOTE — Telephone Encounter (Signed)
Patient gets this Rx with greater qnty/different directions from Dr Gaynell Face per chart notes.

## 2013-10-15 ENCOUNTER — Other Ambulatory Visit: Payer: Self-pay | Admitting: Neurology

## 2013-10-15 NOTE — Telephone Encounter (Signed)
I called and spoke with the patient who says she gets this Rx from Dr Gaynell Face at a different dose.  She will call their office to request a refill.  I called th epharmacy and spoke with Sharyn Lull.  She said they have an Rx from Dr Gaynell Face and we could disregard this request.

## 2013-11-03 ENCOUNTER — Other Ambulatory Visit: Payer: Self-pay | Admitting: Family

## 2013-11-18 ENCOUNTER — Ambulatory Visit: Payer: BC Managed Care – PPO | Admitting: "Endocrinology

## 2013-12-06 ENCOUNTER — Other Ambulatory Visit: Payer: Self-pay | Admitting: Family

## 2013-12-06 MED ORDER — MYCOPHENOLATE MOFETIL 500 MG PO TABS: ORAL_TABLET | ORAL | Status: DC

## 2014-01-11 ENCOUNTER — Other Ambulatory Visit: Payer: Self-pay | Admitting: "Endocrinology

## 2014-01-21 ENCOUNTER — Telehealth: Payer: Self-pay | Admitting: "Endocrinology

## 2014-01-21 NOTE — Telephone Encounter (Signed)
1. Mrs. Jessica Kelley called. Amayiah's pump is not working well. [employer] has sent her a replacement pump for the next 90 days. She has contacted [facility] to order a new pump.  2. I told her that Denzil Hughes will send [place] a form to fill out and we'll be glad to fill it out.  Sherrlyn Hock

## 2014-02-04 ENCOUNTER — Other Ambulatory Visit: Payer: Self-pay | Admitting: "Endocrinology

## 2014-02-14 ENCOUNTER — Other Ambulatory Visit: Payer: Self-pay | Admitting: Neurology

## 2014-02-14 ENCOUNTER — Telehealth: Payer: Self-pay | Admitting: "Endocrinology

## 2014-02-14 ENCOUNTER — Other Ambulatory Visit: Payer: Self-pay | Admitting: *Deleted

## 2014-02-14 DIAGNOSIS — E0590 Thyrotoxicosis, unspecified without thyrotoxic crisis or storm: Secondary | ICD-10-CM

## 2014-02-14 NOTE — Telephone Encounter (Signed)
Per Chart patient is being followe by [facility] and Dr Gaynell Face per mom request

## 2014-02-14 NOTE — Telephone Encounter (Signed)
Labs placed in portal. KW

## 2014-02-17 ENCOUNTER — Other Ambulatory Visit: Payer: Self-pay | Admitting: Family

## 2014-02-17 ENCOUNTER — Telehealth: Payer: Self-pay | Admitting: *Deleted

## 2014-02-17 DIAGNOSIS — G6181 Chronic inflammatory demyelinating polyneuritis: Secondary | ICD-10-CM

## 2014-02-17 LAB — HEMOGLOBIN A1C
HEMOGLOBIN A1C: 11.5 % — AB (ref <5.7)
MEAN PLASMA GLUCOSE: 283 mg/dL — AB (ref <117)

## 2014-02-17 LAB — T4, FREE: Free T4: 0.91 ng/dL (ref 0.80–1.80)

## 2014-02-17 LAB — TSH: TSH: 2.125 u[IU]/mL (ref 0.350–4.500)

## 2014-02-17 LAB — T3, FREE: T3 FREE: 3 pg/mL (ref 2.3–4.2)

## 2014-02-17 MED ORDER — GABAPENTIN 100 MG PO CAPS: ORAL_CAPSULE | ORAL | Status: DC

## 2014-02-17 NOTE — Telephone Encounter (Signed)
Lauren, mom, stated the pt Rx for gabapentin was not received by [facility]. The mother was told that the Rx was sent to the pharmacy. The pt is out of the medication. The mother asked if the Rx can be resent to the pharmacy. The mother can be reached at [phone].

## 2014-02-17 NOTE — Telephone Encounter (Signed)
I have not received a request for refill for Gabapentin since September 2014. I sent in the refill today as she requested. Please let Mom know. Thanks, Otila Kluver

## 2014-02-17 NOTE — Telephone Encounter (Signed)
I called and notified the mother.

## 2014-02-22 LAB — THYROID STIMULATING IMMUNOGLOBULIN: TSI: 35 % baseline (ref <140)

## 2014-02-23 ENCOUNTER — Ambulatory Visit (INDEPENDENT_AMBULATORY_CARE_PROVIDER_SITE_OTHER): Payer: BC Managed Care – PPO | Admitting: "Endocrinology

## 2014-02-23 ENCOUNTER — Encounter: Payer: Self-pay | Admitting: "Endocrinology

## 2014-02-23 VITALS — BP 121/85 | HR 98 | Wt 118.0 lb

## 2014-02-23 DIAGNOSIS — I4711 Inappropriate sinus tachycardia, so stated: Secondary | ICD-10-CM

## 2014-02-23 DIAGNOSIS — E0500 Thyrotoxicosis with diffuse goiter without thyrotoxic crisis or storm: Secondary | ICD-10-CM

## 2014-02-23 DIAGNOSIS — R1013 Epigastric pain: Secondary | ICD-10-CM

## 2014-02-23 DIAGNOSIS — E063 Autoimmune thyroiditis: Secondary | ICD-10-CM | POA: Diagnosis not present

## 2014-02-23 DIAGNOSIS — R000 Tachycardia, unspecified: Secondary | ICD-10-CM

## 2014-02-23 DIAGNOSIS — E10649 Type 1 diabetes mellitus with hypoglycemia without coma: Secondary | ICD-10-CM

## 2014-02-23 DIAGNOSIS — E1043 Type 1 diabetes mellitus with diabetic autonomic (poly)neuropathy: Secondary | ICD-10-CM

## 2014-02-23 DIAGNOSIS — R5382 Chronic fatigue, unspecified: Secondary | ICD-10-CM

## 2014-02-23 DIAGNOSIS — E049 Nontoxic goiter, unspecified: Secondary | ICD-10-CM

## 2014-02-23 DIAGNOSIS — IMO0002 Reserved for concepts with insufficient information to code with codable children: Secondary | ICD-10-CM

## 2014-02-23 DIAGNOSIS — I10 Essential (primary) hypertension: Secondary | ICD-10-CM

## 2014-02-23 DIAGNOSIS — I471 Supraventricular tachycardia: Secondary | ICD-10-CM

## 2014-02-23 DIAGNOSIS — E1065 Type 1 diabetes mellitus with hyperglycemia: Secondary | ICD-10-CM

## 2014-02-23 LAB — GLUCOSE, POCT (MANUAL RESULT ENTRY): POC Glucose: 272 mg/dl — AB (ref 70–99)

## 2014-02-23 MED ORDER — RANITIDINE HCL 150 MG PO TABS: 150.0000 mg | ORAL_TABLET | Freq: Two times a day (BID) | ORAL | Status: DC

## 2014-02-23 MED ORDER — LISINOPRIL 2.5 MG PO TABS: 2.5000 mg | ORAL_TABLET | Freq: Every day | ORAL | Status: DC

## 2014-02-23 NOTE — Progress Notes (Signed)
CHIEF COMPLAINT: The patient presents for follow-up of type 1 diabetes mellitus, chronic inflammatory demyelinating polyneuropathy (CIDP), celiac disease, muscle weakness, limited endurance, goiter, thyroiditis, inappropriate sinus tachycardia, autonomic neuropathy, hypoglycemia, hyperthyroidism secondary to Graves' disease, hyperkalemia, anemia, iron deficiency, hypothyroidism secondary to Hashimoto's thyroiditis, and fatigue.  HISTORY OF PRESENT ILLNESS: The patient is a 22 year-old Caucasian young woman. The patient was accompanied by her mother.  1. This very complicated 80 year-old Caucasian young lady was first referred to me on 09/25/06 by her primary care provider, Dr. Garnet Koyanagi, for evaluation and management of type I diabetes mellitus, hypoglycemia, and a myriad of other autoimmune issues.   A. CIDP: At about age 63 the child began to limp.  At age 71 she was evaluated at [facility] and diagnosed with CIDP. At the time I first met her she was under the care of Dr. Marcene Brawn of [facility] in [place]. She was on a regular regimen of intravenous treatments at home with IVIG and steroids on the days of therapy. The IVIG treatments were given monthly. She was also on cyclosporine A daily, alternating doses every other day. Because of the CIDP she had  significant problems with muscle atrophy and weakness, as well as multiple orthopedic issues. She was also being followed by Dr. Simonne Come, an orthopedist in [place]. On that first visit her legs and arms were quite weak. She found it very hard to walk or to exercise.  B. Type 1 diabetes mellitus: Her diabetes mellitus was also diagnosed at age 27, just prior to the diagnosis of CIDP. She had been started on a Medtronic Paradigm 712 insulin pump subsequently and was using Humalog lispro insulin in her pump. She had a very difficult time with blood glucose control. At the time of her steroids, her sugars tended to run very high.  However, when she was able to perform some physical activities, she tended to develop hypoglycemia. Her hemoglobin A1c on that first visit was 9.5%.  C. Celiac disease: The patient was diagnosed with celiac disease at approximately age 86. She had been on a gluten-free diet ever since.  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 She was followed both at [facility] and [facility] clinic.  2. Since that initial visit with me, I have followed her for the problems listed above plus other new problems that have developed over time.   A. CIDP: Her CIDP course has waxed and waned. At one point she had improved significantly, her medication doses had been reduced, she felt very much stronger, had more stamina, and was doing very well. Unfortunately, she later had a significant relapse of CIDP and lost much of the improvement that she had gained. Since going back to [facility] in the spring of 2013, however, and starting a new and more intensified IVIG regimen, her strength had improved. She was still very weak physically and muscularly, however, and needed support to walk. As noted below, after tapering her steroids recently, her weakness had worsened.  B. Type 1 diabetes mellitus: The patient's blood sugars have varied significantly with the course of her CIDP and the treatments for that disease. Her hemoglobin A1c values have ranged from a low of 8.3% to a high of 14.0%. Most of her A1c values have been in the 8.4-9.6 range.   C. Celiac disease: The patient has really done quite  well over the years. She rarely has signs or symptoms of abdominal problems. Because her antibody levels fluctuate a great deal, her celiac disease antibodies were negative in March 2012. She was mistakenly told that she did not have celiac disease and that she could resume a normal diet. As expected,  resumption of a normal diet caused a recurrence of active celiac disease. In October 2012 she saw a gastroenterologist, Dr. Gale Journey, at [facility] who told her that she did have celiac disease. She is now back on her gluten-free diet and is asymptomatic again.  D. Autoimmune thyroid disease: The patient had a goiter and a TPO antibody level of 287.2 on her first visit in May of 2008, consistent with evolving Hashimoto's Disease. She occasionally had the sensation of swelling and discomfort in her anterior neck, which was also c/w flare-ups of Hashimoto's Disease. In early 2009 she developed increased tachycardia. Lab tests performed in March 2010 by her primary care provider showed a TSH of 0.01 and a T4 of 7.7. It was felt that the increase in tachycardia was likely due to a combination of autonomic neuropathy and Hashitoxicosis.  However on follow-up laboratory tests performed in July, her TSH was 0.05, free T4 3.9, and free T3 14.2. Her TSI level done on the assay used at the time was 2.2, with normal being 1.0 or less. It was then evident that she had developed Graves' Disease in the setting of pre-existing Hashimoto's Disease.  I initiated treatment with methimazole, 10 mg twice daily and propranolol 20 mg 3 times daily. Since then we've seen significant swings of her thyroid function tests, partly due to changes in methimazole doses, but also partly due to changes in her immunosuppressant therapies. During the last 2 years her TSH values have ranged from a low of 0.1432 to a high of 14.933. The dose of methimazole has been adjusted as needed to treat her Graves' disease. It has been my hope that her Hashimoto's disease will eventually cancel out her Graves' disease.   3.The patient's last [facility] visit was on 07/02/13. In the interim she has been healthy. She has been feeling somewhat sluggish and tired recently. She remains on her regimen of IVIG and steroids. She takes IVIG and steroids every Monday and just IVIG on  Thursdays. Since then her strength has been improving, but she still needs physical assistance with getting up from a chair and with walking.  She takes methimazole, 10 mg each morning and 5 mg each evening. She stopped lisinopril 2.5 mg daily previously due to concerns about having low BP. She also stopped ranitidine, but can't remember why. She is also taking propranolol, gabapentin, iron, and vitamin D, and a vitamin B complex pill. She has a good appetite. Although weak, she has been getting out a fair amount. 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 Her headaches have decreased in frequency and severity. Dr. Gaynell Face is now the neurologist caring for her CIDP.  4. Pertinent Review of Systems: Constitutional: The patient says "I feel pretty good." She still tires easily, but is better. Her strength/weakness are better. Her stamina is better.  Eyes: Her contact lenses are helping. Last eye exam was in June or July of last year. There were no signs of diabetes. She is due for a FU exam in January 2016.Marland Kitchen  Neck: The patient has had no problems with anterior  neck swelling and tenderness for a year or more.   Heart: The patient is not aware of her heart beating fast. The patient has no complaints of palpitations, irregular heart beats, chest pain, or chest pressure.   Gastrointestinal:  She has had more upset stomach and dyspepsia since stopping ranitidine. Bowel movents seem normal. The patient has no complaints of excessive hunger, acid reflux, stomach aches or pains, diarrhea, or constipation.  Legs: Muscle mass and strength seem to be improved. There are few complaints of numbness, tingling, burning, or pain. No edema is noted.  Feet: There are no obvious new foot problems. There are no complaints of numbness, tingling, burning, or pain. No edema is noted. Neurologic: She requires somewhat less assistance  with walking than she did at last visit. Sensation seems to be normal. Coordination is better since resuming IVIG. GYN: Her LMP was three weeks ago.  Menses have been more regular lately.   Hypoglycemia: She has not had much hypoglycemia recently.   5. Blood glucose printout: She says that she changes her pump site every 3-7 days, but does not always perform rewinds. . She checks BGs 0-6 times per day, averaging 3 times per day. She boluses 1-9 times daily, mostly 3-4 times per day, mostly food boluses.. BGs varied from 69 to >400. Her average BG is 250, compared with 267 at last visit and with 271 at the prior visit. She has had 5 BGs >400 readings, compared with 13 at last visit. . When she takes IVIG and steroids on Mondays her BGs are elevated for about 24-36 hours, sometimes for up to 48 hours.   PAST MEDICAL, FAMILY, AND SOCIAL HISTORY: 1. School: The patient finished high school. She is not taking any college courses on line now. She is working part-time now as noted above. .      2. Activities: She has been walking a lot more, up to 2.5 miles per day. She will have follow up at [facility] every 1-2 years.    3. Smoking, alcohol, or drugs: None 4. Primary Care Provider: Dr. Garnet Koyanagi at [facility]. 5. Neurologist; Dr. Gaynell Face  REVIEW OF SYSTEMS: There are no other significant problems involving the patient's other body systems.  PHYSICAL EXAM: BP 121/85  Pulse 98  Wt 118 lb (53.524 kg) She is alert today. Her affect is fairly normal. She looks more rested and healthier. She has gained 1 pound since last visit. Her HR has increased one beat per minute from 97 at last visit.  Constitutional: This patient appears reasonably healthy,more upbeat, and stronger. She needs the help of her mother to stand from a sitting position and to walk. Her mother remained in the background for today's visit, allowing Kritika full opportunity to have as independent a visit as possible. Head: The head is  normocephalic. Face: The face appears normal. There are no obvious dysmorphic features. Eyes: The eyes appear to be normally formed and spaced. Gaze is conjugate. There is no obvious arcus or proptosis. Moisture appears normal. Mouth: The oropharynx and tongue appear normal. Dentition appears to be normal for age. Oral moisture is normal. Neck: On inspection the thyroid gland is enlarged. No carotid bruits are noted. The thyroid gland is more enlarged at 25+ grams in size, but the lobes have changed in size. The gland is asymmetric in that the left lobe is more enlarged than the right and the isthmus is much more enlarged than at last visit. The thyroid gland is not tender to  palpation. Lungs: The lungs are clear to auscultation. Air movement is good. Heart: Heart rate and rhythm are regular. Heart sounds S1 and S2 are normal. I did not appreciate any pathologic cardiac murmurs. Abdomen: The abdomen is normal in size for the patient's age. Bowel sounds are normal. There is no obvious hepatomegaly, splenomegaly, or other mass effect.  Arms: Muscle size and bulk are low-normal for age. Hands: There is a slight tremor today. Phalangeal and metacarpophalangeal joints are normal. Palmar muscles are low-normal for age. Palmar skin is normal. Palmar moisture is 2+.. Legs: Muscles appear low-normal for age. No edema is present. Feet: Her feet are cool. Dorsalis pedal pulses are faint 1+ on the right and 1+ on the left.    Neurologic: Strength is 4-5/5 for age in the upper extremities and 3/5 in the right leg and 4+/5 in the left leg. Muscle tone is somewhat low. Sensation to touch is normal in both her legs and feet.    LAB DATA: 02/23/14: HbA1c is 11.5% today.   Labs 02/17/14: TSH 2.125, free T4 0.91, free T3 3.0  Labs 07/09/13; HbA1c was 11.4%, compared with 9.1% at last visit and with 14.0% at the visit prior. TSH was 0.652, free T4 0.88, free T3 3.3  Labs 10/06/12: TSH 0.088, free T4 1.28, free T3  3.1  Labs 02/25/12: TSH 1.044, free T4 1.45, free T3 3.4  Labs 06/14/11: TSH was 1.633. Free T4 1.47. Free T3 was 3.1. TSI was 56 (normal less than 120).                       ASSESSMENT: 1. Type 1 diabetes mellitus: By HbA1c, the BG control is about the same, much higher than we'd like to see. Since resuming IVIG and steroids her BGs are higher. She needs more insulin for 34-48 hours after taking steroids. Overall she is a bit less compliant with BG checks, but she is more compliant with insulin boluses.  She needs a higher basal rate for 24-48 hours after she gets her steroids.   2. Hypoglycemia: Occasional, but infrequent    3. Chronic inflammatory demyelinating polyneuropathy (CIDP) and muscle weakness: She feels better subjectively and looks stronger and healthier objectively since resuming her IVIG/steroid regimen.    4. Autoimmune thyroid disease: The patient is now euthyroid, the first time in 2 years. We have increased her MTZ to 10 mg in the mornings and 5 mg in the evenings. She appears clinically euthyroid today and is chemically euthyroid. She has an on-going battle between her Graves Dz B lymphocytes and her Hashimoto's Dz T lymphocytes.  5. Goiter: Thyroid is larger today in both lobes and isthmus. The waxing and waning of thyroid lobe size is c/w evolving Hashimoto's disease.  6. Autonomic neuropathy with tachycardia: The heart rate is still somewhat high, but much better than one year ago.   7. Hashimoto's thyroiditis: The patient's thyroiditis is clinically quiescent at this time. I expect that as her Hashimoto's disease progressively destroys more thyroid cells, we will be able to taper her methimazole to zero. She will eventually be permanently hypothyroid and be on Synthroid hormone replacement. 8. Fatigue and decreased stamina: These problems have definitely improved.   9. Thyrotoxicosis: Her diffuse thyrotoxicosis, secondary to Graves' disease, has waxed and waned over time.  Her  thyrotoxicosis is now well controlled by her current methimazole doses.  10. Celiac disease: Patient is back on her gluten-free diet. She is asymptomatic.  11. Emotionality:  The patient is doing better. 12. Dyspepsia: She is worse after stopping ranitidine. I will re-order it now.  13. Hypertension: she is worse since stopping lisinopril. I will re-order it now.   PLAN: 1. Diagnostic: She will have TFTs, TSI, and TTG IgA before next visit. Call in 2 weeks with BG report.     2. Therapeutic:  A. Continue current basal rates as follows:   MN: 0.6506 6 AM: 0.800 11:30 AM: 1.10 5:30 PM: 0.900 B. On the day of IVIG and steroids,use a temporary basal rate of 120% for 8 hours, repeating every 8 hours for up to 36 hours as needed. C. Re-start ranitidine, 150 mg/ twice daily and lisinopril, 2.5 mg daily. 3. Patient education: We discussed the issue of using an increased temporary basal rate for 36 hours after taking steroids.She needs to be as consistent as possible with pump site changes, BG checks, and insulin boluses.  The more consistent she can be, the fewer variables we will have to deal with.  4. Follow-up: 3 months  Level of Service: This visit lasted in excess of 80 minutes. More than 50% of the visit was devoted to counseling.  Sherrlyn Hock

## 2014-02-23 NOTE — Patient Instructions (Addendum)
Follow up visit in 3 months.Please schedule her for the last appointments in the mornings or in the afternoons.  Please have lab tests done 1-2 weeks prior. Use a temporary basal rate of 120% in 8-hour blocks of time, for up to 36 hours after steroids.

## 2014-03-03 ENCOUNTER — Other Ambulatory Visit: Payer: Self-pay | Admitting: "Endocrinology

## 2014-03-24 ENCOUNTER — Other Ambulatory Visit: Payer: Self-pay | Admitting: *Deleted

## 2014-03-24 DIAGNOSIS — E034 Atrophy of thyroid (acquired): Secondary | ICD-10-CM

## 2014-03-24 MED ORDER — PROPRANOLOL HCL 10 MG PO TABS: ORAL_TABLET | ORAL | Status: DC

## 2014-03-25 ENCOUNTER — Other Ambulatory Visit: Payer: Self-pay | Admitting: "Endocrinology

## 2014-04-07 ENCOUNTER — Ambulatory Visit: Payer: BC Managed Care – PPO | Admitting: Pediatrics

## 2014-04-08 ENCOUNTER — Ambulatory Visit: Payer: BC Managed Care – PPO | Admitting: Pediatrics

## 2014-04-12 ENCOUNTER — Other Ambulatory Visit: Payer: Self-pay | Admitting: *Deleted

## 2014-04-12 DIAGNOSIS — IMO0002 Reserved for concepts with insufficient information to code with codable children: Secondary | ICD-10-CM

## 2014-04-12 DIAGNOSIS — E1065 Type 1 diabetes mellitus with hyperglycemia: Secondary | ICD-10-CM

## 2014-05-19 ENCOUNTER — Ambulatory Visit: Payer: BC Managed Care – PPO | Admitting: Pediatrics

## 2014-06-01 ENCOUNTER — Ambulatory Visit (INDEPENDENT_AMBULATORY_CARE_PROVIDER_SITE_OTHER): Payer: BLUE CROSS/BLUE SHIELD | Admitting: "Endocrinology

## 2014-06-01 ENCOUNTER — Encounter: Payer: Self-pay | Admitting: "Endocrinology

## 2014-06-01 VITALS — BP 121/82 | HR 109 | Wt 119.0 lb

## 2014-06-01 DIAGNOSIS — IMO0002 Reserved for concepts with insufficient information to code with codable children: Secondary | ICD-10-CM

## 2014-06-01 DIAGNOSIS — E1065 Type 1 diabetes mellitus with hyperglycemia: Secondary | ICD-10-CM

## 2014-06-01 LAB — T4, FREE: FREE T4: 0.94 ng/dL (ref 0.80–1.80)

## 2014-06-01 LAB — HEMOGLOBIN A1C
Hgb A1c MFr Bld: 10.5 % — ABNORMAL HIGH (ref <5.7)
Mean Plasma Glucose: 255 mg/dL — ABNORMAL HIGH (ref <117)

## 2014-06-01 LAB — GLUCOSE, POCT (MANUAL RESULT ENTRY): POC GLUCOSE: 256 mg/dL — AB (ref 70–99)

## 2014-06-01 LAB — T3, FREE: T3 FREE: 2.9 pg/mL (ref 2.3–4.2)

## 2014-06-01 LAB — POCT GLYCOSYLATED HEMOGLOBIN (HGB A1C): Hemoglobin A1C: 9.7

## 2014-06-01 LAB — TSH: TSH: 3.28 u[IU]/mL (ref 0.350–4.500)

## 2014-06-01 NOTE — Patient Instructions (Signed)
Follow up visit in 2 months in a New Patient slot.

## 2014-06-01 NOTE — Progress Notes (Signed)
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  HISTORY OF PRESENT ILLNESS: The patient is a 24 year-old Caucasian young woman. The patient was accompanied by her mother to the visit, but then mom stepped out and left [place] and me alone.  1. This very complicated 24 year-old Caucasian young lady was first referred to me on 09/25/06 by her primary care provider, Dr. Garnet Koyanagi, for evaluation and management of type I diabetes mellitus, hypoglycemia, and a myriad of other autoimmune issues.   A. CIDP: At about age 38 the child began to limp.  At age 65 she was evaluated at [facility] and diagnosed with CIDP. At the time I first met her she was under the care of Dr. Marcene Brawn of [facility] in [place]. She was on a regular regimen of intravenous treatments at home with IVIG and steroids on the days of therapy. The IVIG treatments were given monthly. She was also on cyclosporine A daily, alternating doses every other day. Because of the CIDP she had  significant problems with muscle atrophy and weakness, as well as multiple orthopedic issues. She was also being followed by Dr. Simonne Come, an orthopedist in [place]. On that first visit her legs and arms were quite weak. She found it very hard to walk or to exercise.  B. Type 1 diabetes mellitus: Her diabetes mellitus was also diagnosed at age 24, just prior to the diagnosis of CIDP. She had been started on a Medtronic Paradigm 712 insulin pump subsequently and was using Humalog lispro insulin in her pump. She had a very difficult time with  blood glucose control. At the time of her steroids, her sugars tended to run very high. However, when she was able to perform some physical activities, she tended to develop hypoglycemia. Her hemoglobin A1c on that first visit was 9.5%.  C. Celiac disease: The patient was diagnosed with celiac disease at approximately age 37. She had been on a gluten-free diet ever since.  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 She was followed both at [facility] and [facility] eye clinic.  2. Since that initial visit with me, I have followed her for the problems listed above plus other new problems that have developed over time.   A. CIDP: Her CIDP course has waxed and waned. At one point she had improved significantly, her medication doses had been reduced, she felt very much stronger, had more stamina, and was doing very well. Unfortunately, she later had a significant relapse of CIDP and lost much of the improvement that she had gained. Since going back to [facility] in the spring of 2013, however, and starting a new and more intensified IVIG regimen, her strength had improved. She was still very weak physically and muscularly, however, and needed support to walk. As noted below, after tapering her steroids recently, her weakness had worsened.  B. Type 1 diabetes mellitus: The patient's blood sugars have varied significantly with the course of her CIDP and the treatments for that disease. Her hemoglobin A1c values have ranged from a low of 8.3% to a high of 14.0%. Most of her A1c values  have been in the 8.4-9.6 range.   C. Celiac disease: The patient has really done quite well over the years. She rarely has had signs or symptoms of abdominal problems. Because her antibody levels fluctuate a great deal, her celiac disease antibodies were negative in March 2012. She was mistakenly told  that she did not have celiac disease and that she could resume a normal diet. As expected, resumption of a normal diet caused a recurrence of active celiac disease. In October 2012 she saw a gastroenterologist, Dr. Gale Journey, at [facility] who told her that she did have celiac disease. She is now back on her gluten-free diet and is asymptomatic again.  D. Autoimmune thyroid disease: The patient had a goiter and a TPO antibody level of 287.2 on her first visit in May of 2008, consistent with evolving Hashimoto's Disease. She occasionally had the sensation of swelling and discomfort in her anterior neck, which was also c/w flare-ups of Hashimoto's Disease. In early 2009 she developed increased tachycardia. Lab tests performed in March 2010 by her primary care provider showed a TSH of 0.01 and a T4 of 7.7. It was felt that the increase in tachycardia was likely due to a combination of autonomic neuropathy and Hashitoxicosis.  However on follow-up laboratory tests performed in July, her TSH was 0.05, free T4 3.9, and free T3 14.2. Her TSI level done on the assay used at the time was 2.2, with normal being 1.0 or less. It was then evident that she had developed Graves' Disease in the setting of pre-existing Hashimoto's Disease.  I initiated treatment with methimazole, 10 mg twice daily and propranolol 20 mg 3 times daily. Since then we've seen significant swings of her thyroid function tests, partly due to changes in methimazole doses, but also partly due to changes in her immunosuppressant therapies. During the last 2 years her TSH values have ranged from a low of 0.1432 to a high of 14.933. The dose of methimazole has been adjusted as needed to treat her Graves' disease. It has been my hope that her Hashimoto's disease will eventually cancel out her Graves' disease.   3.The patient's last [facility] visit was on 02/23/14. In the interim she has been healthy. Her energy level is "pretty good". She remains on her regimen of IVIG and  steroids. She takes IVIG and steroids every Monday and just IVIG on Thursdays. Instead of using a temporary basal rate of 120% after the iv treatments, she decided to check her BGs more frequently and bolus accordingly. Since last visit her strength has been improving, but she still needs physical assistance with getting up from a chair and with walking.  She takes methimazole, 10 mg each morning and 5 mg each evening. She resumed lisinopril 2.5 mg daily and ranitidine, 150 mg, twice daily. She is also taking propranolol, gabapentin, iron, vitamin D, and a vitamin B complex pill. She has a good appetite. Although weak, she has been getting out a fair amount. She now walks 3-4 miles per day. 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 During the pre-Xmas and Xmas period she was working a lot more hours and did not check her BGs as often or did not enter the BG values into her pump as often as she should.. She uses a walker to help her walk from the parking lot to her job. Her headaches have essentially resolved. Dr. Gaynell Face is the neurologist caring for her CIDP.  4. Pertinent Review  of Systems: Constitutional: The patient says she feels "good." She still "tires more easily than the average person", but is better. Her strength and weakness are better. Her stamina is better.  Eyes: Her contact lenses are helping. Last eye exam was last week in January 2016. There may have been some signs of early diabetic eye disease, but "nothing to worry about now". She is due for a FU exam in 8 months.   Neck: The patient has had no problems with anterior neck swelling and tenderness for a year or more.   Heart: The patient is not aware of her heart beating fast. Her heart rates during her iv treatments are usually about 80. The patient has no complaints of palpitations, irregular heart beats, chest pain, or chest pressure.   Gastrointestinal:  Her upset stomach and dyspepsia have resolved since  re-starting ranitidine. Bowel movents seem normal. The patient has no complaints of excessive hunger, acid reflux, stomach aches or pains, diarrhea, or constipation.  Legs: Muscle mass and strength seem to be improved. There are few complaints of numbness, tingling, burning, or pain. No edema is noted.  Feet: There are no obvious new foot problems. There are no complaints of numbness, tingling, burning, or pain. No edema is noted. Neurologic: She requires somewhat less assistance with walking than she did at last visit. Sensation seems to be normal. Coordination is better since resuming IVIG. GYN: Her LMP was 2-3 weeks ago.  Menses have been regular.   Hypoglycemia: She occasionally has a low blood sugar at work.    5. Blood glucose printout: She changes her pump site every 2-5 days, but does not always perform rewinds. . Before New Years she was only checking BGs and/or inputting BG data into her pump 0-3 times per day. Since New Year's Day she has been checking BGs 3-8 times per day. She has recently been bolusing 3-8 times daily, mostly 5-7 times per day. She is doing mostly combined correction boluses and food boluses now. Since New Year's Day her BGs varied from 65 > 400.  When she takes IVIG and steroids on Mondays her BGs are elevated for about 24-36 hours, sometimes for up to 48 hours. A temporary basal rate of 120% for 4-8 hours at a time is still recommended.   PAST MEDICAL, FAMILY, AND SOCIAL HISTORY: 1. School: She is taking two college courses on line now. She is working part-time now as noted above. .      2. Activities: She has been walking a lot more, up to 2.5 miles per day. She will have follow up appointment at [facility] every 1-2 years. She will get a new [place] shepherd puppy this weekend.  3. Smoking, alcohol, or drugs: None 4. Primary Care Provider: Dr. Garnet Koyanagi at [facility] 5. Neurologist; Dr. Gaynell Face  REVIEW OF SYSTEMS: There are no other significant problems involving  the patient's other body systems.  PHYSICAL EXAM: BP 121/82 mmHg  Pulse 109  Wt 119 lb (53.978 kg) Repeat HR was 102. She is alert today. Her affect is normal. She looks more rested and healthier. She has gained 1 pound since last visit.  Constitutional: This patient appears reasonably healthy ,more upbeat, and stronger. She needed my help to stand from a sitting position and to walk. Her gait and strength are still not normal, but are better Head: The head is normocephalic. Face: The face appears normal. There are no obvious dysmorphic features. Eyes: The eyes appear to be normally formed and  spaced. Gaze is conjugate. There is no obvious arcus or proptosis. Moisture appears normal. Mouth: The oropharynx and tongue appear normal. Dentition appears to be normal for age. Oral moisture is normal. Neck: On inspection the thyroid gland is enlarged. No carotid bruits are noted. The thyroid gland is a bit smaller at 24-25 grams in size, but the lobes have changed in size. The gland is asymmetric in that the left lobe is much smaller. The right lobe is more enlarged. The right side of the isthmus is more enlarged.. The thyroid gland is not tender to palpation. Lungs: The lungs are clear to auscultation. Air movement is good. Heart: Heart rate and rhythm are regular. Heart sounds S1 and S2 are normal. I did not appreciate any pathologic cardiac murmurs. Abdomen: The abdomen is normal in size for the patient's age. Bowel sounds are normal. There is no obvious hepatomegaly, splenomegaly, or other mass effect.  Arms: Muscle size and bulk are low-normal for age. Hands: There is a slight tremor today. Phalangeal and metacarpophalangeal joints are normal. Palmar muscles are low-normal for age. Palmar skin is normal. Palmar moisture is 2+.. Legs: Muscles appear low-normal for age. No edema is present. Feet: Her feet are cool. Dorsalis pedal pulses are faint-to-1+ on the right and 1-to-2+ on the left.     Neurologic: Strength is 4-5/5 for age in the upper extremities and 3/5 in the right leg and 4+/5 in the left leg. Muscle tone is somewhat low. Sensation to touch is normal in both her legs and feet.    LAB DATA:  Labs 06/01/14: HbA1c is 9.7% today, compared with 11.5% at last visit. Other labs are pending.  Labs 02/17/14: TSH 2.125, free T4 0.91, free T3 3.0  Labs 07/09/13; HbA1c was 11.4%, compared with 9.1% at last visit and with 14.0% at the visit prior. TSH was 0.652, free T4 0.88, free T3 3.3  Labs 10/06/12: TSH 0.088, free T4 1.28, free T3 3.1  Labs 02/25/12: TSH 1.044, free T4 1.45, free T3 3.4  Labs 06/14/11: TSH was 1.633. Free T4 1.47. Free T3 was 3.1. TSI was 56 (normal less than 120).                       ASSESSMENT: 1. Type 1 diabetes mellitus: Her BG control is much better. She is not having as many higher BGs and only one low BG. She does need higher basal rates across the board. She also needs a higher temporary basal rate for 8-48 hours after IVIG and steroids.   2.  Autonomic neuropathy with tachycardia: The heart rate is still high. Her autonomic neuropathy and tachycardia will reverse when her BGs come under even better control.  3. Hypoglycemia: Occasional, but infrequent    4-8 Autoimmune thyroid disease, Graves' disease, Hashimoto's disease, and goiter:   A. The patient was euthyroid in October 2015, the first time in 2 years, after increasing her MTZ to 10 mg in the mornings and 5 mg in the evenings. She appears clinically euthyroid today.  Within her thyroid gland there is an on-going battle between her Graves Dz B lymphocytes and her Hashimoto's Dz T lymphocytes.   B. Thyrotoxicosis: Her diffuse thyrotoxicosis, secondary to Graves' disease, has waxed and waned over time.  Her thyrotoxicosis was well controlled by her current methimazole doses at her last visit. TFTs were drawn today and are pending. We'll see what her TFTs look like now..   C. Hashimoto's thyroiditis:  The patient's  thyroiditis is clinically quiescent at this time. I expect that as her Hashimoto's disease progressively destroys more thyroid cells, we will be able to taper her methimazole to zero. She will eventually be permanently hypothyroid and will require Synthroid hormone replacement.  D.Goiter: Thyroid is smaller overall today, much smaller in the left lobe, but much larger in the right lobe. The isthmus is a bit smaller. The waxing and waning of thyroid lobe size is c/w evolving Hashimoto's disease.  9. Celiac disease: Patient is back on her gluten-free diet. She is slowly gaining weight and is asymptomatic. 10.  Chronic inflammatory demyelinating polyneuropathy (CIDP) and muscle weakness: She feels better subjectively and looks stronger and healthier objectively since resuming her IVIG/steroid regimen.    11-12. Fatigue and decreased stamina: These problems have definitely improved.   13. Emotionality: The patient is doing much better. I continue to be impressed and inspired by Avangelina's dignity and courage as she tried to do all that she can to improve her own health. 15. Dyspepsia: She is much better after resuming ranitidine. 15. Hypertension: Her BP is better since resuming lisinopril. Since she is trying to walk more, I do not want to increased the lisinopril dose at this time. I would rather allow her exercise and current lisinopril dose to work together to reduce her BP further.Marland Kitchen   PLAN: 1. Diagnostic: HbA1c today. She had TFTs, TSI, and TTG IgA drawn this morning.Call in 2 weeks with BG report.  2. Therapeutic:  A. Increase current basal rates as follows:   MN: 0.650 -> 0.700 6 AM: 0.850 -> 0.900 11:30 AM: 1.10 -> 1.150 5:30 PM: 0.775 -> 0.825 B. On the day of IVIG and steroids,use a temporary basal rate of 120% for 4-8 hours, repeating every 4-8 hours for up to 48 hours as needed. C. Continue ranitidine, 150 mg/ twice daily and lisinopril, 2.5 mg daily. 3. Patient education:  We discussed the issue of using an increased temporary basal rate for 36 hours after taking steroids.She needs to be as consistent as possible with pump site changes, BG checks, and insulin boluses.  The more consistent she can be, the fewer variables we will have to deal with.  4. Follow-up: 2 months in a New Patient slot  Level of Service: This visit lasted in excess of 60 minutes. More than 50% of the visit was devoted to counseling.  Sherrlyn Hock

## 2014-06-03 LAB — THYROID STIMULATING IMMUNOGLOBULIN: TSI: 13 % baseline (ref <140)

## 2014-06-21 ENCOUNTER — Encounter: Payer: Self-pay | Admitting: Family Medicine

## 2014-07-07 ENCOUNTER — Telehealth: Payer: Self-pay | Admitting: *Deleted

## 2014-07-07 IMAGING — CT CT HEAD W/O CM
1 of 2 series · 16 of 30 positions shown, 20 images · non-contrast
Comparison: 06/04/2007 sinus CT

CLINICAL DATA: Fall, posterior head trauma.

CT HEAD WITHOUT CONTRAST
TECHNIQUE: Contiguous axial images were obtained from the base of
the skull through the vertex without contrast.

[Series 2: head 4.8 h37s · axial · 0.44mm/px · z∈[-196,-63]mm · 16 of 32 slices shown, 20 images]
[im 2/32  brain]
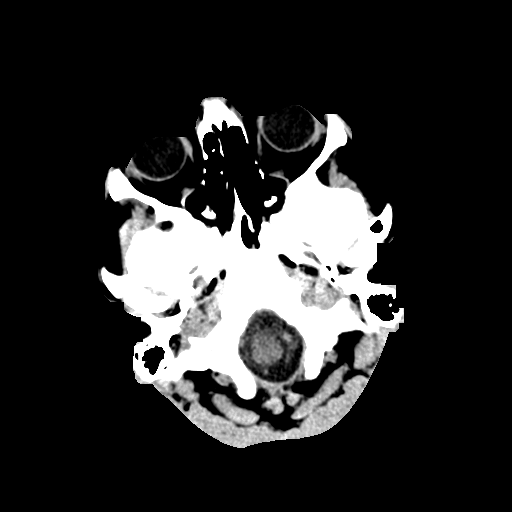
[im 2/32  bone]
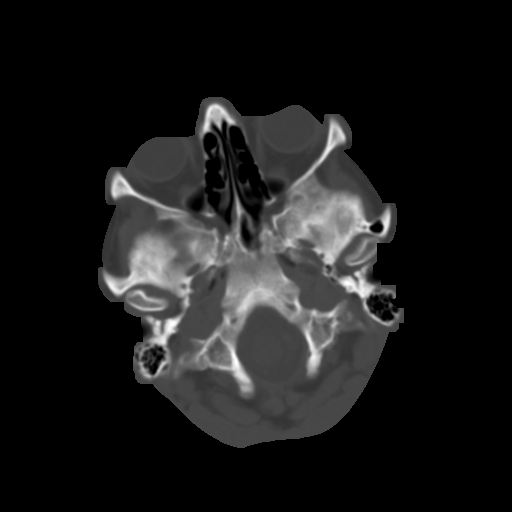
[im 3/32  brain]
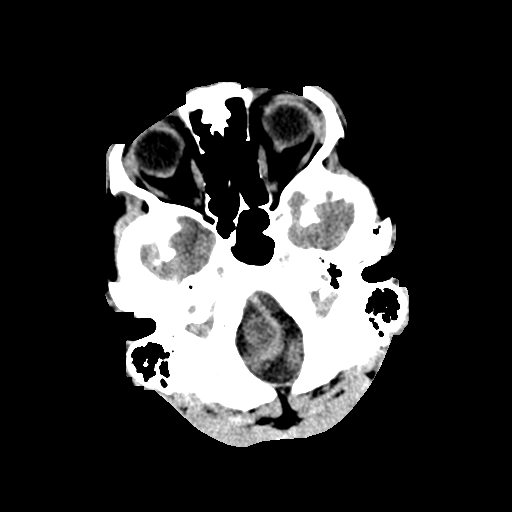
[im 6/32  brain]
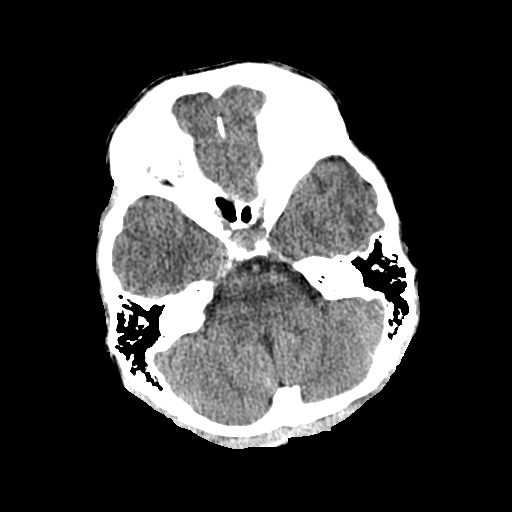
[im 8/32  brain]
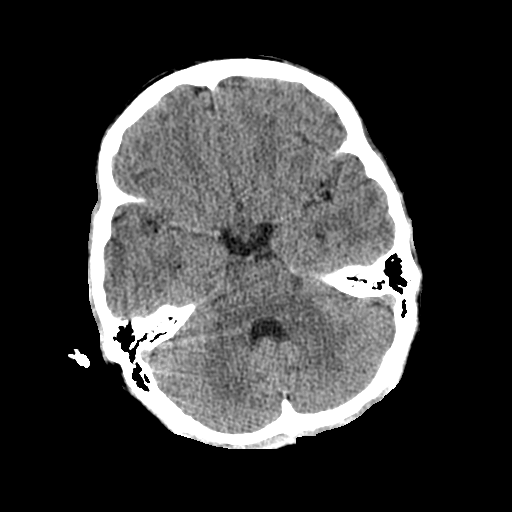
[im 9/32  brain]
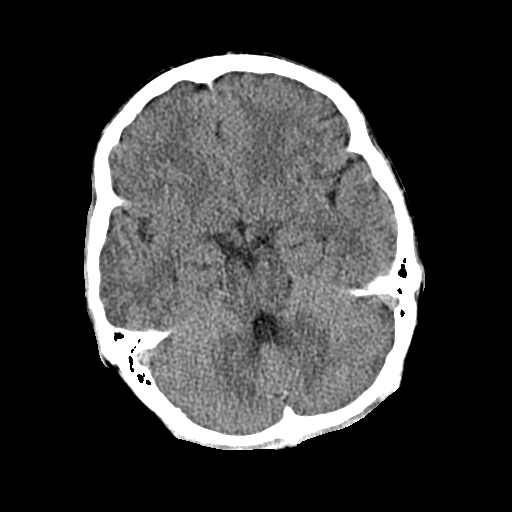
[im 9/32  bone]
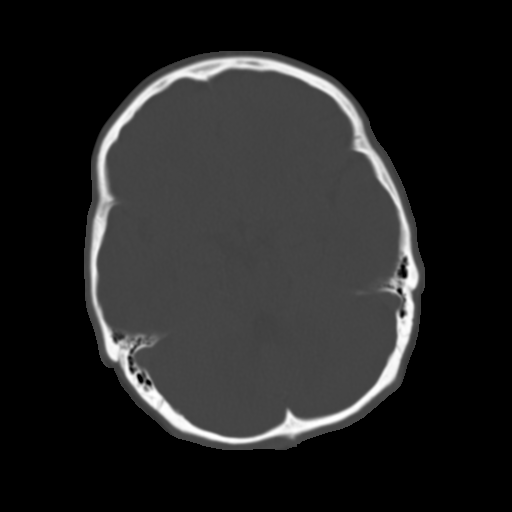
[im 11/32  brain]
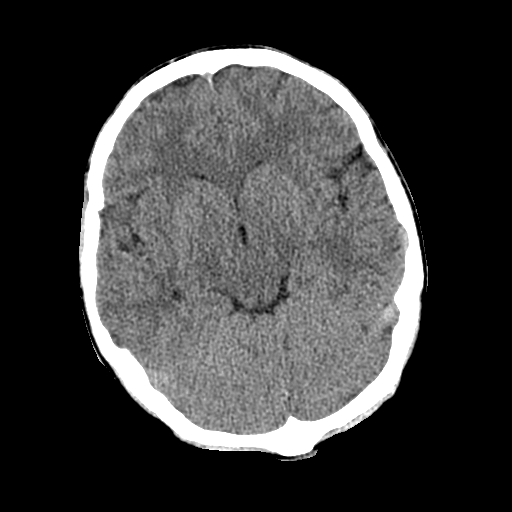
[im 14/32  brain]
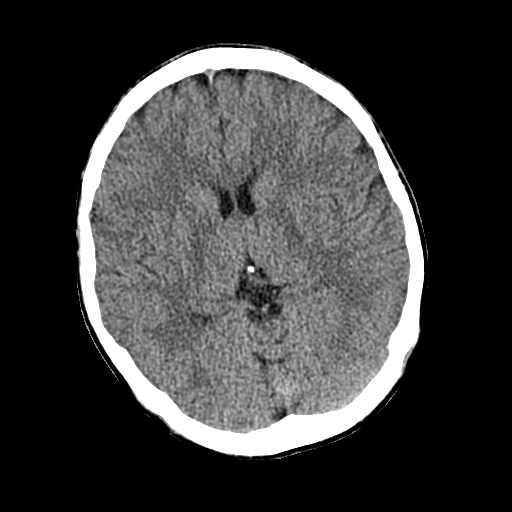
[im 15/32  brain]
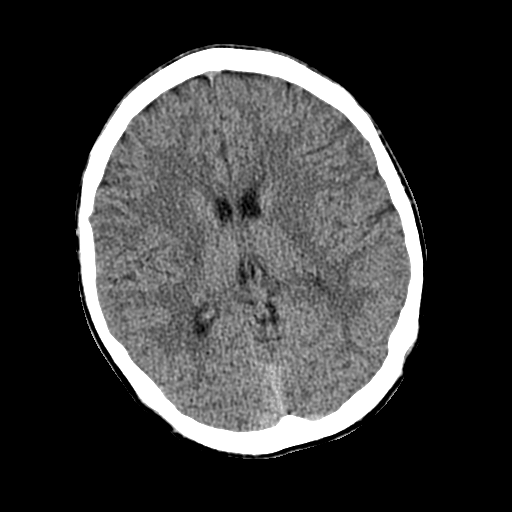
[im 17/32  brain]
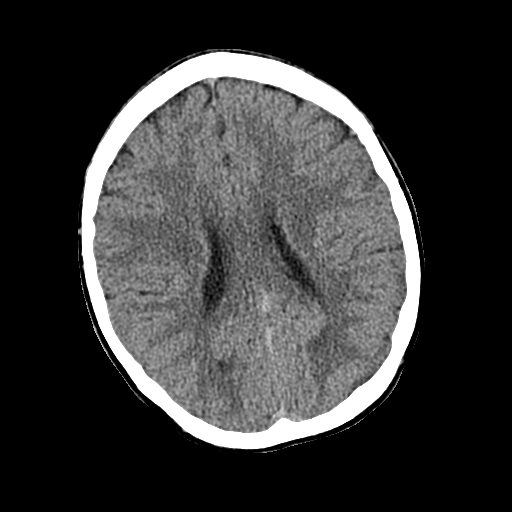
[im 17/32  bone]
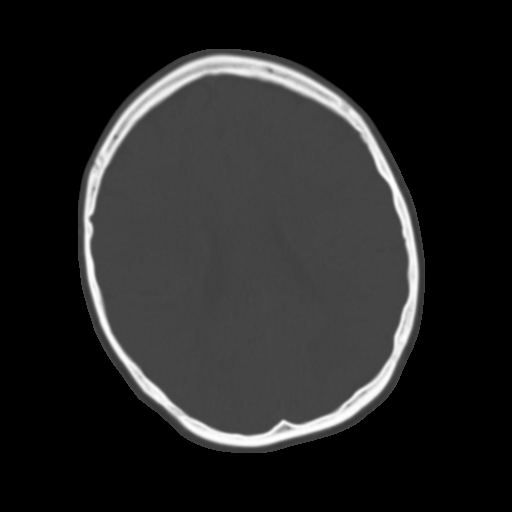
[im 18/32  brain]
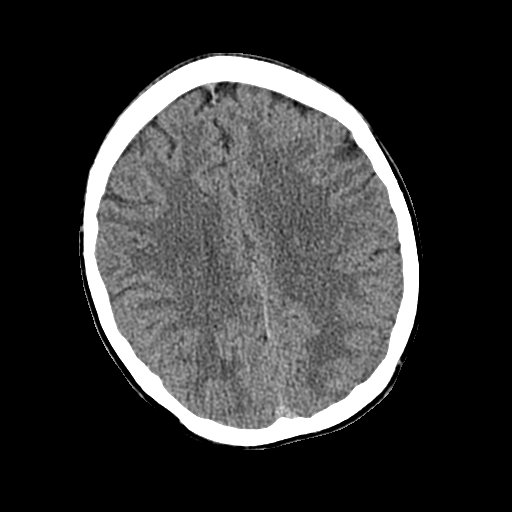
[im 21/32  brain]
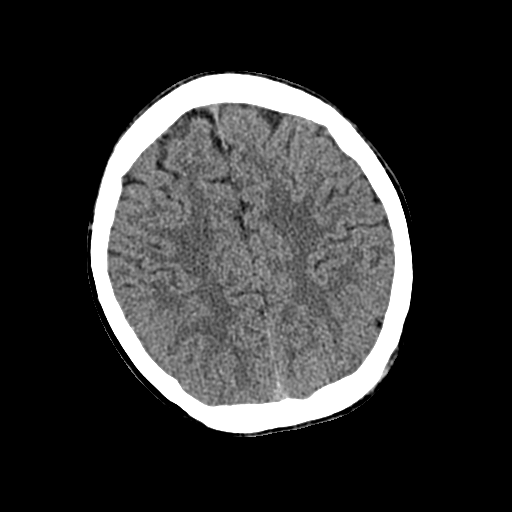
[im 23/32  brain]
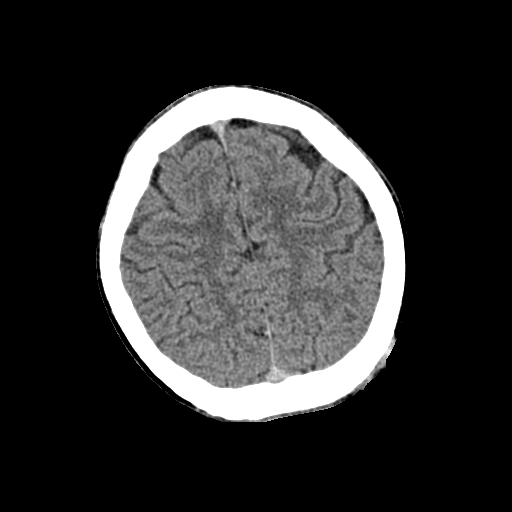
[im 24/32  brain]
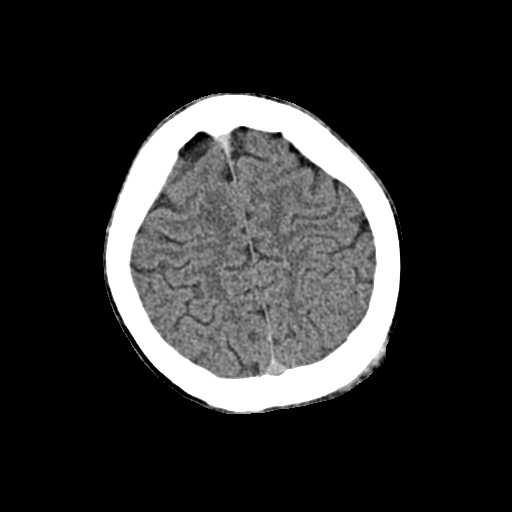
[im 24/32  bone]
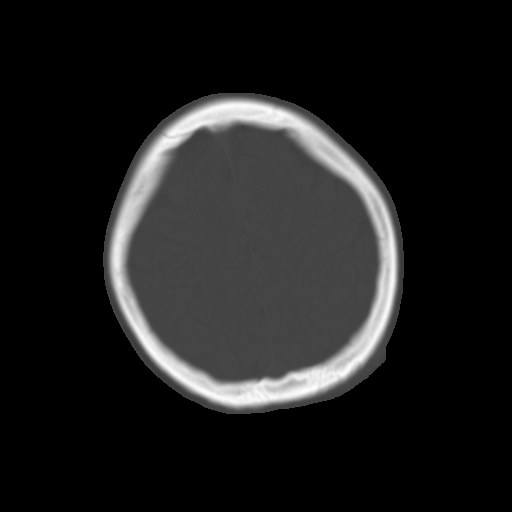
[im 26/32  brain]
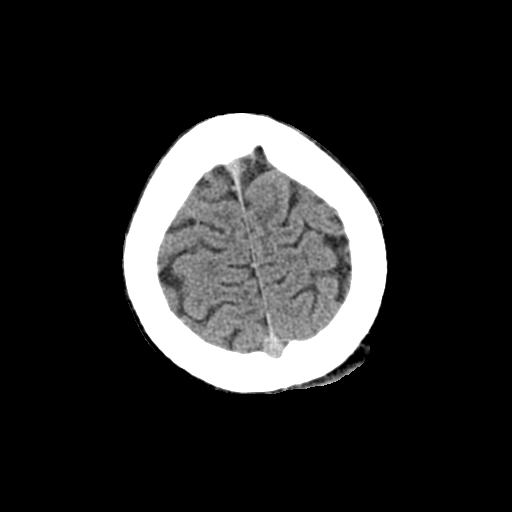
[im 29/32  brain]
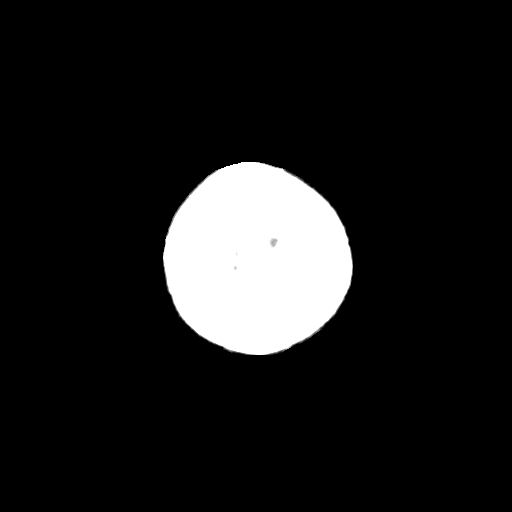
[im 30/32  brain]
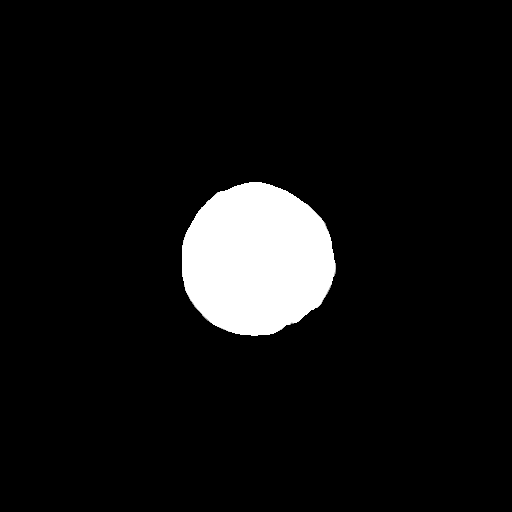

[16 of 30 positions shown; findings below may reference images not displayed]

FINDINGS: There is no evidence for acute hemorrhage, hydrocephalus,
mass lesion, or abnormal extra-axial fluid collection.  No definite
CT evidence for acute infarction.  The visualized paranasal sinuses
and mastoid air cells are predominately clear.  Left posterior
scalp swelling.  No underlying calvarial fracture.
IMPRESSION: Left posterior scalp swelling without underlying calvarial fracture
or acute intracranial abnormality.

## 2014-07-07 NOTE — Telephone Encounter (Signed)
Spoke to mother, advised that per Dr. Tobe Sos: HbA1c was still elevated at 10.5%, but decreased from 11.5%. TFTs were borderline hypothyroid. TSI was normal and lower, c/w good suppressive effect of her current methimazole doses of 10 mg in the morning and 5 mg in the evening. She needs a very small decrease in her methimazole doses. Please reduce methimazole to 5 mg on Wednesday and Sunday mornings, but continue the 10 mg doses on the other 5 mornings of the week. Please continue the 5 mg dose each evening. Please repeat her TFTs in 2-3 months.

## 2014-07-12 ENCOUNTER — Encounter: Payer: Self-pay | Admitting: Pediatrics

## 2014-07-12 ENCOUNTER — Ambulatory Visit (INDEPENDENT_AMBULATORY_CARE_PROVIDER_SITE_OTHER): Payer: BLUE CROSS/BLUE SHIELD | Admitting: Pediatrics

## 2014-07-12 VITALS — BP 104/62 | HR 80 | Ht 63.5 in | Wt 117.2 lb

## 2014-07-12 DIAGNOSIS — G6181 Chronic inflammatory demyelinating polyneuritis: Secondary | ICD-10-CM

## 2014-07-12 DIAGNOSIS — G43009 Migraine without aura, not intractable, without status migrainosus: Secondary | ICD-10-CM

## 2014-07-12 DIAGNOSIS — M4140 Neuromuscular scoliosis, site unspecified: Secondary | ICD-10-CM

## 2014-07-12 NOTE — Progress Notes (Signed)
Patient: Jessica Kelley MRN: [id number] Sex: female DOB: [birth year]  Provider: Jodi Geralds, MD Location of Care: [facility]  Note type: Routine return visit  History of Present Illness: Referral Source: Dr. Garnet Koyanagi History from: mother, patient and [facility] chart Chief Complaint: Chronic inflammatory demyelinating polyneuritis   Jessica Kelley is a 24 y.o. female who returns July 12, 2014 for the first time since June 29, 2013.  She has chronic inflammatory demyelinating polyneuropathy.  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  She has responded to immune therapy, but when there are changes in the therapy and even over time, she has lost strength.  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  She has done best on a treatment of IVIG three times per week and started Medrol once a week.  She feels that she is stable or somewhat improved.  Objective testing suggests that she may be slightly stronger in her upper extremities and she is no weaker in her lower extremities.  She works at [employer] anywhere from 8 to 40 hours per week.  When she is there she walks between 2 and 3 miles per day.  This is amazing considering that she has to use a walker for stability.  This exercise has actually helped make her stronger and has kept her from deteriorating in my opinion.  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  She does not seem to have significant weakness or fatigue the day after she has physically extended herself.  She is living at home with her mother and attending [employer] in online courses that will gradually build up transferable credits.  Her sister has moved from [place] back to the [place] area, therefore, she has planned  to stay in this area.  In general, she has not had any new health problems since her last visit.  Her diabetes has been in fairly good control.  She is not showing significant problems with hypertension.  She has not complained of significant problems with pain.  Review of Systems: 12 system review was unremarkable  Past Medical History Diagnosis Date  . Diabetes type I     Dr. Tobe Sos  . Celiac disease   . Macular degeneration   . Hypothyroidism   . Wyburn-Mason syndrome   . Glaucoma   . Scoliosis   . CIDP (chronic inflammatory demyelinating polyneuropathy) 2001  . Pilonidal cyst    Hospitalizations: No., Head Injury: No., Nervous System Infections: No., Immunizations up to date: Yes.    Birth History 7 pound 9 ounce infant born at full-term.  Gestation was uncomplicated.  Labor lasted 23 hours.  Operative delivery with forceps.  Nursery course was uneventful.  Growth and development is recalled as normal.  Behavior History none  Surgical History Procedure Laterality Date  . Cataract extraction  06/25/06  . Glaucoma surgery  02/12/06  . Foot arthroplasty      left foot moved and stretched tendons   . Hip arthroplasty      right never bipopsy  . Portacath placement  2002-2004  . Removal of vascular port    . Sciatic nerve muscle biopsy     Family History family history includes Cancer in her maternal grandfather and paternal grandmother; Coronary artery disease in her paternal grandfather; Diabetes in her sister; Hyperlipidemia in  an other family member; Other in her sister; Prostate cancer in her maternal grandfather; Thyroid disease in her mother. Family history is negative for migraines, seizures, intellectual disabilities, blindness, deafness, birth defects, chromosomal disorder, or autism.  Social History . Marital Status: Single    Spouse Name: N/A  . Number of Children: N/A  . Years of Education: N/A   Social History Main Topics  . Smoking status:  Never Smoker   . Smokeless tobacco: Never Used     Comment: passive smoke exposure  . Alcohol Use: No  . Drug Use: No  . 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   Allergies Allergen Reactions  . Gluten Meal   . Wheat Bran    Physical Exam BP 104/62 mmHg  Pulse 80  Ht 5' 3.5" (1.613 m)  Wt 117 lb 3.2 oz (53.162 kg)  BMI 20.43 kg/m2  LMP 06/11/2014 (Within Days)  General: alert, well developed, well nourished, in no acute distress, blond hair, blue eyes, left-handed  Head: normocephalic, no dysmorphic features  Ears, Nose and Throat: Otoscopic: tympanic membranes normal . Pharynx: oropharynx is pink without exudates or tonsillar hypertrophy.  Neck: supple, full range of motion, no cranial or cervical bruits  Respiratory: auscultation clear  Cardiovascular: no murmurs, pulses are normal  Musculoskeletal: the patient has some atrophy of her right leg, distal atrophy in her calves, and convex right scoliosis that is mild  Skin: no rashes or neurocutaneous lesions  Neurologic Exam  Mental Status: alert; oriented to person, place, and year; knowledge is normal for age; language is normal  Cranial Nerves: visual fields are full to double simultaneous stimuli; extraocular movements are full and conjugate; pupils are round reactive to light with a left afferent pupillary defect, left iridectomy with lens implant; funduscopic examination shows sharp disc margins with normal vessels on the right and dilated vessels part of a vascular anomaly known as [place] which caused glaucoma; symmetric facial strength; midline tongue and uvula; air conduction is greater than bone conduction bilaterally.  Motor: normal strength in her neck  except extensors 4+. Deltoid 4 right, 4- left, biceps 4+ right, 4 left, triceps 5 right, 4+ left, wrist extensors 5 right, 5 left, wrist flexors 5 bilaterally, grip 5 right 5 left, finger extensors 4-4+ bilaterally,Supraspinatous 4 right, 4- left, pronator 4+ bilaterally, supinator 4 bilaterally, pectoralis 4+ left, 5 right; hip flexors 2 right, 3 left , hip adductors 0 right, 3 left, hip abductors 0 right, 3 left; knee flexors 1 right, 4- left; knee extensors 0 right, 4- left, foot dorsiflexors and plantar flexors 0-1 right, 4- left, toes 0 right, 3 left.  Sensory: No significant peripheral polyneuropathy to cold or vibration. Proprioception appears intact The patient had normal stereognosis. Coordination: good finger-to-nose, rapid repetitive alternating movements and finger apposition  Gait and Station: diplegia gait with waddling, steppage right greater than left, external rotation of her legs, broad-based, arms extended out from her side for balance; unable to walk on her heels or toes, perform tandem Gower response was not attempted, Able to walk short distances without support. Reflexes: symmetric and absent bilaterally; no clonus; bilateral flexor plantar responses  Assessment 1. Chronic inflammatory demyelinating polyneuritis, G61.81. 2. Migraine without aura and without status migrainosus, not intractable, G43.009. 3. 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  Discussion  She did not even mention her migraine headaches today.  I am very pleased with the stability in her examination and believe that she may be somewhat stronger in her arms.  I praised her for getting out of the house to work.  This is basically just in her nature and over time, it is going to keep her medically and neurologically stable if not improved.  She wondered whether or not we could lessen the IVIG treatments.  Each time that has been done, she deteriorates.  I would strongly dissuade her from that.  She is scheduled to see  Dr. Lerry Paterson at [facility] this summer.  No change should be made in her treatment until she is seen and I suspect that at that time he will strongly recommend that no change to be made.  Plan Darris will return to see me in one year.  I will see her sooner depending upon clinical need.  She did not require refill of any prescriptions today.  I will refill them as needed.  I spent 30 minutes of face-to-face time with Alfhild and her mother, more than half of it in consultation.   Medication List   This list is accurate as of: 07/12/14 12:05 PM.       gabapentin 100 MG capsule  Commonly known as:  NEURONTIN  TAKE TWO CAPSULES BY MOUTH EVERY DAY AT  8  AM,  THEN  TAKE  TWO  CAPS  AT  4  PM,  THEN  TAKE  THREE  CAPS  AT  11  PM     glucagon 1 MG injection  Follow package directions for low blood sugar.     ibuprofen 200 MG tablet  Commonly known as:  ADVIL,MOTRIN  Take 4 tablets at onset of headache, may repeat every 4-6 hours PRN     Immune Globulin (Human) 5 GM/50ML Soln  Inject into the vein.     insulin aspart 100 UNIT/ML injection  Commonly known as:  NOVOLOG  Use 300 units every 48 hours with  insulin pump     lisinopril 2.5 MG tablet  Commonly known as:  PRINIVIL,ZESTRIL  Take 1 tablet (2.5 mg total) by mouth daily.     methimazole 5 MG tablet  Commonly known as:  TAPAZOLE  Take two methimazole 5 mg tablets (66m) in am and one tablet methimazole 571min pm.     methylPREDNISolone sodium succinate 500 MG injection  Commonly known as:  SOLU-MEDROL  - Give 50075mV in 50 ml NS over 1 hour x 1 every 4 weeks.  - Given by [facility]     Multiple Vitamins tablet  Take by mouth.     mycophenolate 500 MG tablet  Commonly known as:  CELLCEPT  TAKE 2 TABLETS BY MOUTH   EVERY MORNING AND TAKE 2  TABLETS EVERY EVENING     pantoprazole 40 MG tablet  Commonly known as:  PROTONIX     predniSONE 20 MG tablet  Commonly known as:  DELTASONE  TAKE ONE TABLET BY MOUTH  THE DAY BEFORE, DAY OF, AND DAY AFTER IVIG. ATTEMPT TO DISCONTINUE THIS IN ONE MONTH     propranolol 10 MG tablet  Commonly known as:  INDERAL  TAKE TWO TABLETS BY MOUTH THREE TIMES DAILY     RA IRON 27 MG Tabs  Generic drug:  Ferrous Sulfate  Take by mouth 2 (two) times daily.     ranitidine 150 MG tablet  Commonly known  as:  ZANTAC  Take 1 tablet (150 mg total) by mouth 2 (two) times daily.     sodium chloride 0.9 % infusion  Inject into the vein continuous.     VITAMIN B-12 PO  Take by mouth.     Vitamin D 1000 UNITS capsule  Take 1,000 Units by mouth daily.      The medication list was reviewed and reconciled. All changes or newly prescribed medications were explained.  A complete medication list was provided to the patient/caregiver.  Jodi Geralds MD

## 2014-08-02 ENCOUNTER — Ambulatory Visit: Payer: BLUE CROSS/BLUE SHIELD | Admitting: "Endocrinology

## 2014-08-18 ENCOUNTER — Telehealth: Payer: Self-pay | Admitting: Family

## 2014-08-18 ENCOUNTER — Encounter (HOSPITAL_BASED_OUTPATIENT_CLINIC_OR_DEPARTMENT_OTHER): Payer: Self-pay

## 2014-08-18 ENCOUNTER — Emergency Department (HOSPITAL_BASED_OUTPATIENT_CLINIC_OR_DEPARTMENT_OTHER): Payer: BLUE CROSS/BLUE SHIELD

## 2014-08-18 ENCOUNTER — Telehealth: Payer: Self-pay | Admitting: *Deleted

## 2014-08-18 ENCOUNTER — Emergency Department (HOSPITAL_BASED_OUTPATIENT_CLINIC_OR_DEPARTMENT_OTHER)
Admission: EM | Admit: 2014-08-18 | Discharge: 2014-08-18 | Disposition: A | Discharge status: Home / Self Care | Payer: BLUE CROSS/BLUE SHIELD | Attending: Emergency Medicine | Admitting: Emergency Medicine

## 2014-08-18 DIAGNOSIS — E109 Type 1 diabetes mellitus without complications: Secondary | ICD-10-CM | POA: Diagnosis not present

## 2014-08-18 DIAGNOSIS — Z8719 Personal history of other diseases of the digestive system: Secondary | ICD-10-CM | POA: Insufficient documentation

## 2014-08-18 DIAGNOSIS — Z8669 Personal history of other diseases of the nervous system and sense organs: Secondary | ICD-10-CM | POA: Diagnosis not present

## 2014-08-18 DIAGNOSIS — Q282 Arteriovenous malformation of cerebral vessels: Secondary | ICD-10-CM | POA: Insufficient documentation

## 2014-08-18 DIAGNOSIS — Z794 Long term (current) use of insulin: Secondary | ICD-10-CM | POA: Diagnosis not present

## 2014-08-18 DIAGNOSIS — Z872 Personal history of diseases of the skin and subcutaneous tissue: Secondary | ICD-10-CM | POA: Insufficient documentation

## 2014-08-18 DIAGNOSIS — Z79899 Other long term (current) drug therapy: Secondary | ICD-10-CM | POA: Diagnosis not present

## 2014-08-18 DIAGNOSIS — Z8739 Personal history of other diseases of the musculoskeletal system and connective tissue: Secondary | ICD-10-CM | POA: Diagnosis not present

## 2014-08-18 DIAGNOSIS — R079 Chest pain, unspecified: Secondary | ICD-10-CM | POA: Diagnosis present

## 2014-08-18 DIAGNOSIS — R0789 Other chest pain: Secondary | ICD-10-CM | POA: Insufficient documentation

## 2014-08-18 LAB — TROPONIN I: Troponin I: <0.03 ng/mL (ref <0.031)

## 2014-08-18 LAB — CBC WITH DIFFERENTIAL/PLATELET
BASOS PCT: 0 % (ref 0–1)
Basophils Absolute: 0 10*3/uL (ref 0.0–0.1)
EOS ABS: 0 10*3/uL (ref 0.0–0.7)
Eosinophils Relative: 0 % (ref 0–5)
HCT: 40.8 % (ref 36.0–46.0)
Hemoglobin: 13.4 g/dL (ref 12.0–15.0)
LYMPHS ABS: 0.5 10*3/uL — AB (ref 0.7–4.0)
Lymphocytes Relative: 5 % — ABNORMAL LOW (ref 12–46)
MCH: 29.9 pg (ref 26.0–34.0)
MCHC: 32.8 g/dL (ref 30.0–36.0)
MCV: 91.1 fL (ref 78.0–100.0)
MONOS PCT: 2 % — AB (ref 3–12)
Monocytes Absolute: 0.2 10*3/uL (ref 0.1–1.0)
NEUTROS ABS: 8.6 10*3/uL — AB (ref 1.7–7.7)
NEUTROS PCT: 93 % — AB (ref 43–77)
PLATELETS: 181 10*3/uL (ref 150–400)
RBC: 4.48 MIL/uL (ref 3.87–5.11)
RDW: 13.7 % (ref 11.5–15.5)
WBC: 9.3 10*3/uL (ref 4.0–10.5)

## 2014-08-18 LAB — BASIC METABOLIC PANEL
ANION GAP: 5 (ref 5–15)
BUN: 14 mg/dL (ref 6–23)
CALCIUM: 9.2 mg/dL (ref 8.4–10.5)
CO2: 27 mmol/L (ref 19–32)
Chloride: 99 mmol/L (ref 96–112)
Creatinine, Ser: 0.59 mg/dL (ref 0.50–1.10)
GFR calc Af Amer: >90 mL/min (ref >90)
Glucose, Bld: 166 mg/dL — ABNORMAL HIGH (ref 70–99)
Potassium: 4.2 mmol/L (ref 3.5–5.1)
Sodium: 131 mmol/L — ABNORMAL LOW (ref 135–145)

## 2014-08-18 LAB — D-DIMER, QUANTITATIVE: D-Dimer, Quant: <0.27 ug/mL-FEU (ref 0.00–0.48)

## 2014-08-18 MED ORDER — KETOROLAC TROMETHAMINE 60 MG/2ML IM SOLN
INTRAMUSCULAR | Status: AC
  Administered 2014-08-18: 60 mg via INTRAMUSCULAR
  Filled 2014-08-18: qty 2

## 2014-08-18 MED ORDER — KETOROLAC TROMETHAMINE 30 MG/ML IJ SOLN
30.0000 mg | Freq: Once | INTRAMUSCULAR | Status: DC
  Filled 2014-08-18: qty 1

## 2014-08-18 MED ORDER — KETOROLAC TROMETHAMINE 60 MG/2ML IM SOLN
60.0000 mg | Freq: Once | INTRAMUSCULAR | Status: AC
  Administered 2014-08-18: 60 mg via INTRAMUSCULAR

## 2014-08-18 MED ORDER — IBUPROFEN 400 MG PO TABS: 400.0000 mg | ORAL_TABLET | Freq: Four times a day (QID) | ORAL | Status: DC | PRN

## 2014-08-18 NOTE — Telephone Encounter (Signed)
Mom Jessica Kelley left message about Jessica Kelley saying that she woke up this AM with chest pain and short of breath. She is able to talk but feels uncomfortable. Her nurse is doing IVIG treatment and put ice on chest but chest pains continued. Mom thinks that she needs to go to ER but asks should treatment continue  & finish first? Mom asked for call back at [phone]. Mom left another message at 126pm asking for call back at [phone]. I received the messages at 2:15PM. I called Mom back and told her that since the chest pain and shortness of breath began before the IVIG and has continued that Jessica Kelley should be seen in ER. Mom said that they had just stopped the treatment and were leaving house now to go. I told her that Dr Gaynell Face was out of the office until Monday but ER doctor can contact Dr Nab if needed.  TG

## 2014-08-18 NOTE — Discharge Instructions (Signed)

## 2014-08-18 NOTE — ED Provider Notes (Signed)
CSN: [id number]     Arrival date & time 08/18/14  1449 History   First MD Initiated Contact with Patient 08/18/14 1557     Chief Complaint  Patient presents with  . Chest Pain     (Consider location/radiation/quality/duration/timing/severity/associated sxs/prior Treatment) HPI Jessica Kelley is a 24 y.o. female history of CIDP, Wyburn-Mason syndrome, glaucoma, DM 1, celiac disease who comes in for evaluation of chest discomfort. Patient states at 7:00 this morning she woke up to use the bathroom and noticed a dull ache in her right chest that she rates as a 3/10. She reports upon moving this pain becomes a sharp shooting pain. This pain has been constant all day. She has a home health nurse who gives her IVIG for her CIDP. She reported her symptoms to her nurse and the nurse told her to come to the ED for evaluation of blood clot as this can be a side effect of the IVIG. Denies any shortness of breath, cough, nausea or vomiting, diaphoresis, abdominal pain. She denies any recent travels or surgeries, exogenous estrogen, unilateral leg swelling, history of blood clot, hemoptysis. No other aggravating or modifying factors.  Past Medical History  Diagnosis Date  . Diabetes type I     Dr. Tobe Sos  . Celiac disease   . Macular degeneration   . Hypothyroidism   . Wyburn-Mason syndrome   . Glaucoma   . Scoliosis   . CIDP (chronic inflammatory demyelinating polyneuropathy) 2001  . Pilonidal cyst    Past Surgical History  Procedure Laterality Date  . Cataract extraction  06/25/06  . Glaucoma surgery  02/12/06  . Foot arthroplasty      left foot moved and stretched tendons   . Hip arthroplasty      right never bipopsy  . Portacath placement  2002-2004  . Removal of vascular port    . Sciatic nerve muscle biopsy     Family History  Problem Relation Age of Onset  . Prostate cancer Maternal Grandfather   . Cancer Maternal Grandfather     prostate  . Coronary artery disease Paternal  Grandfather   . Hyperlipidemia    . Diabetes Sister   . Other Sister     thyroid issues  . Thyroid disease Mother   . Cancer Paternal Grandmother     Died at 71   History  Substance Use Topics  . Smoking status: Never Smoker   . Smokeless tobacco: Never Used     Comment: passive smoke exposure  . Alcohol Use: No   OB History    No data available     Review of Systems A 10 point review of systems was completed and was negative except for pertinent positives and negatives as mentioned in the history of present illness     Allergies  Gluten meal and Wheat bran  Home Medications   Prior to Admission medications   Medication Sig Start Date End Date Taking? Authorizing Provider  Cholecalciferol (VITAMIN D) 1000 UNITS capsule Take 1,000 Units by mouth daily.      Historical Provider, MD  Cyanocobalamin (VITAMIN B-12 PO) Take by mouth.    Historical Provider, MD  Ferrous Sulfate (RA IRON) 27 MG TABS Take by mouth 2 (two) times daily.      Historical Provider, MD  gabapentin (NEURONTIN) 100 MG capsule TAKE TWO CAPSULES BY MOUTH EVERY DAY AT  8  AM,  THEN  TAKE  TWO  CAPS  AT  4  PM,  THEN  TAKE  THREE  CAPS  AT  11  PM 02/17/14   Joelyn Oms, NP  glucagon 1 MG injection Follow package directions for low blood sugar. 11/17/12   Sherrlyn Hock, MD  ibuprofen (ADVIL,MOTRIN) 400 MG tablet Take 1 tablet (400 mg total) by mouth every 6 (six) hours as needed. 08/18/14   Comer Locket, PA-C  Immune Globulin, Human, 5 GM/50ML SOLN Inject into the vein.    Historical Provider, MD  insulin aspart (NOVOLOG) 100 UNIT/ML injection Use 300 units every 48 hours with  insulin pump 07/20/13   Sherrlyn Hock, MD  lisinopril (PRINIVIL,ZESTRIL) 2.5 MG tablet Take 1 tablet (2.5 mg total) by mouth daily. 02/23/14   Sherrlyn Hock, MD  methimazole (TAPAZOLE) 5 MG tablet Take two methimazole 5 mg tablets (39m) in am and one tablet methimazole 574min pm. 03/09/13   MiSherrlyn HockMD   methylPREDNISolone sodium succinate (SOLU-MEDROL) 500 MG injection Give 50098mV in 50 ml NS over 1 hour x 1 every 4 weeks. Given by [facility]   TinJoelyn OmsP  Multiple Vitamins tablet Take by mouth.    Historical Provider, MD  mycophenolate (CELLCEPT) 500 MG tablet TAKE 2 TABLETS BY MOUTH   EVERY MORNING AND TAKE 2  TABLETS EVERY EVENING 12/06/13   TinJoelyn OmsP  pantoprazole (PROTONIX) 40 MG tablet  08/15/09   Historical Provider, MD  predniSONE (DELTASONE) 20 MG tablet TAKE ONE TABLET BY MOUTH THE DAY BEFORE, DAY OF, AND DAY AFTER IVIG. ATTEMPT TO DISCONTINUE THIS IN ONE MONTH 08/24/13   TinJoelyn OmsP  propranolol (INDERAL) 10 MG tablet TAKE TWO TABLETS BY MOUTH THREE TIMES DAILY 03/24/14   MicSherrlyn HockD  ranitidine (ZANTAC) 150 MG tablet Take 1 tablet (150 mg total) by mouth 2 (two) times daily. 02/23/14   MicSherrlyn HockD  sodium chloride 0.9 % solution Inject into the vein continuous.      Historical Provider, MD   BP 118/69 mmHg  Pulse 82  Temp(Src) 98 F (36.7 C) (Oral)  Resp 18  Ht 5' 3"  (1.6 m)  Wt 115 lb (52.164 kg)  BMI 20.38 kg/m2  SpO2 100%  LMP 07/18/2014 Physical Exam  Constitutional: She is oriented to person, place, and time. She appears well-developed and well-nourished.  HENT:  Head: Normocephalic and atraumatic.  Mouth/Throat: Oropharynx is clear and moist.  Eyes: Conjunctivae are normal. Pupils are equal, round, and reactive to light. Right eye exhibits no discharge. Left eye exhibits no discharge. No scleral icterus.  Neck: Neck supple.  Cardiovascular: Normal rate, regular rhythm and normal heart sounds.   Pulmonary/Chest: Effort normal and breath sounds normal. No respiratory distress. She has no wheezes. She has no rales.  No chest wall tenderness. No obvious lesions or deformities. No axillary lymphadenopathy.  Abdominal: Soft. There is no tenderness.  Musculoskeletal: She exhibits no tenderness.   Neurological: She is alert and oriented to person, place, and time.  Cranial Nerves II-XII grossly intact  Skin: Skin is warm and dry. No rash noted.  Psychiatric: She has a normal mood and affect.  Nursing note and vitals reviewed.   ED Course  Procedures (including critical care time) Labs Review Labs Reviewed  BASIC METABOLIC PANEL - Abnormal; Notable for the following:    Sodium 131 (*)    Glucose, Bld 166 (*)    All other components within normal limits  CBC WITH DIFFERENTIAL/PLATELET - Abnormal; Notable for  the following:    Neutrophils Relative % 93 (*)    Neutro Abs 8.6 (*)    Lymphocytes Relative 5 (*)    Lymphs Abs 0.5 (*)    Monocytes Relative 2 (*)    All other components within normal limits  TROPONIN I  D-DIMER, QUANTITATIVE    Imaging Review Dg Chest 2 View  08/18/2014   CLINICAL DATA:  Acute onset chest pain this morning.  EXAM: CHEST  2 VIEW  COMPARISON:  None.  FINDINGS: The heart size and mediastinal contours are within normal limits. Both lungs are clear. No evidence of pleural effusion. No mass or lymphadenopathy identified. Thoracic levoscoliosis noted.  IMPRESSION: No active cardiopulmonary disease.  Scoliosis noted.   Electronically Signed   By: Earle Gell M.D.   On: 08/18/2014 16:30     EKG Interpretation   Date/Time:  Thursday August 18 2014 15:06:52 EDT Ventricular Rate:  91 PR Interval:  142 QRS Duration: 72 QT Interval:  340 QTC Calculation: 418 R Axis:   57 Text Interpretation:  Normal sinus rhythm Normal ECG Confirmed by DELOS   MD, DOUGLAS ([id number]) on 08/18/2014 4:27:08 PM     Meds given in ED:  Medications  ketorolac (TORADOL) injection 60 mg (60 mg Intramuscular Given 08/18/14 1732)    Discharge Medication List as of 08/18/2014  5:47 PM     Filed Vitals:   08/18/14 1453 08/18/14 1712 08/18/14 1748  BP: 118/77 122/69 118/69  Pulse: 94 89 82  Temp: 98.1 F (36.7 C) 98.4 F (36.9 C) 98 F (36.7 C)  TempSrc: Oral Oral Oral   Resp: 16 18 18   Height: 5' 3"  (1.6 m)    Weight: 115 lb (52.164 kg)    SpO2: 99% 99% 100%    MDM  Vitals stable - WNL -afebrile Pt resting comfortably in ED. reports that the Toradol significantly improved her chest discomfort. States she does not notice it now PE--benign lung exam. Physical exam overall is grossly benign. Labwork noncontributory, Trop neg, D dimer neg. EKG not concerning. Imaging-chest x-ray shows no acute cardiopulmonary pathology.  DDX--patient here with right chest discomfort that is exacerbated with certain movements. Likely experiencing musculoskeletal strain. No evidence of other acute or emergent pathology. Doubt cardiac or pulmonary etiology. Symptoms improved with Toradol in ED. Will DC with NSAIDs and instructions to follow-up with primary care for further evaluation management of symptoms. I discussed all relevant lab findings and imaging results with pt and they verbalized understanding. Discussed f/u with PCP within 48 hrs and return precautions, pt very amenable to plan.    Final diagnoses:  Chest discomfort        Comer Locket, PA-C 08/19/14 [place], MD 08/20/14 [phone]

## 2014-08-18 NOTE — ED Notes (Signed)
Pt reports right sided chest pain starting this morning. Denies SHOB and cough.

## 2014-08-18 NOTE — Telephone Encounter (Signed)
Received TC from mother Jessica Kelley, states that Jessica Kelley is having chest pain and hard to catch a breath, advised she needs to go to ED and get checked out. Mom stated that she is with the nurse getting her IVIG treatments, mom wanted to speak with Dr. Tobe Sos to see what he would advise. Mom said her BG values have not been bad. Spoke with Dr. Tobe Sos and he advised to go to ED to get checked out. Mom said will do so, after she is finished with treatment.

## 2014-08-19 NOTE — Telephone Encounter (Signed)
Thank you, check back with them today.

## 2014-08-19 NOTE — Telephone Encounter (Signed)
I called and spoke to Mom. [employer] was seen in the ER and pain was determined to be chest wall pain or perhaps muscle strain. She improved after receiving Toradol in the ER and was discharged. TG

## 2014-08-20 NOTE — Telephone Encounter (Signed)
Noted, glad it was not anything serious.  Thanks

## 2014-08-22 ENCOUNTER — Telehealth: Payer: Self-pay | Admitting: *Deleted

## 2014-08-22 NOTE — Telephone Encounter (Signed)
Spoke to patient, moved 4/28 appt to 5/24.

## 2014-08-28 ENCOUNTER — Other Ambulatory Visit: Payer: Self-pay | Admitting: "Endocrinology

## 2014-09-01 ENCOUNTER — Telehealth: Payer: Self-pay | Admitting: "Endocrinology

## 2014-09-01 ENCOUNTER — Telehealth: Payer: Self-pay | Admitting: Family

## 2014-09-01 NOTE — Telephone Encounter (Signed)
I left a message for father to call back.  I most would like to know what approach should be taken that would have the greatest likelihood of having an Corporate investment banker assistance with this dog training.

## 2014-09-01 NOTE — Telephone Encounter (Signed)
Mom Georgana Curio left message about Rmani. She said that they are getting a puppy that will be trained as a Neurosurgeon for [employer]. The dog will be trained to walk with her and to be aware of issues about her diabetes. Would you write a note to see if insurance would help pay for the training. It is going to cost about $18,000 to train the dog. Mom asked that you call Dad, Marily Lente about the letter at [phone]. TG

## 2014-09-02 NOTE — Telephone Encounter (Signed)
I encouraged father to figure out what the dog would be able to do to help his daughter if the dog was adequately trained and second if the trainers new an approach that would be successful in providing a paper trail that would culminate in defraying the cost of this training.

## 2014-09-05 NOTE — Telephone Encounter (Signed)
Routed to provider. KW

## 2014-09-15 ENCOUNTER — Ambulatory Visit: Payer: BLUE CROSS/BLUE SHIELD | Admitting: "Endocrinology

## 2014-09-29 ENCOUNTER — Other Ambulatory Visit: Payer: Self-pay | Admitting: "Endocrinology

## 2014-10-11 ENCOUNTER — Ambulatory Visit: Payer: BLUE CROSS/BLUE SHIELD | Admitting: "Endocrinology

## 2014-11-02 ENCOUNTER — Telehealth: Payer: Self-pay | Admitting: Family

## 2014-11-02 NOTE — Telephone Encounter (Signed)
Mom Jessica Kelley left message about Jessica Kelley. Mom said that she was seen at [facility] in [place] last week. He felt that she was stronger, and recommended reducing treatments once per week on Monday. The Thursday treatment will be stopped for now. He will be sending you information about the visit and recommendations. [facility] will need new orders for this change. If you don't receive the information from [facility], let Mom know. [phone] TG

## 2014-11-02 NOTE — Telephone Encounter (Signed)
I spoke with mother for 5 minutes.  I am concerned about dropping her treatment, but they are determined to go ahead.  Every time we've dropped her therapy, Jessica Kelley is gotten weaker.  She is taking some form of drops from an herbalist that she thinks are making her better.  I am skeptical.  I'm pleased that she is stronger.  [facility] usually sends [place] the orders.  I will sign them.

## 2014-11-03 ENCOUNTER — Other Ambulatory Visit: Payer: Self-pay | Admitting: *Deleted

## 2014-11-03 DIAGNOSIS — IMO0002 Reserved for concepts with insufficient information to code with codable children: Secondary | ICD-10-CM

## 2014-11-03 DIAGNOSIS — E1065 Type 1 diabetes mellitus with hyperglycemia: Secondary | ICD-10-CM

## 2014-11-03 DIAGNOSIS — E0500 Thyrotoxicosis with diffuse goiter without thyrotoxic crisis or storm: Secondary | ICD-10-CM

## 2014-11-03 NOTE — Telephone Encounter (Signed)
I talked to [place] and they will send new orders for signature. TG

## 2014-11-06 ENCOUNTER — Other Ambulatory Visit: Payer: Self-pay | Admitting: "Endocrinology

## 2014-11-07 ENCOUNTER — Other Ambulatory Visit: Payer: Self-pay | Admitting: Family

## 2014-11-09 LAB — HEMOGLOBIN A1C
Hgb A1c MFr Bld: 10.9 % — ABNORMAL HIGH (ref <5.7)
MEAN PLASMA GLUCOSE: 266 mg/dL — AB (ref <117)

## 2014-11-09 LAB — T3, FREE: T3 FREE: 2.7 pg/mL (ref 2.3–4.2)

## 2014-11-09 LAB — T4, FREE: Free T4: 0.87 ng/dL (ref 0.80–1.80)

## 2014-11-09 LAB — TSH: TSH: 2.234 u[IU]/mL (ref 0.350–4.500)

## 2014-11-11 ENCOUNTER — Ambulatory Visit (INDEPENDENT_AMBULATORY_CARE_PROVIDER_SITE_OTHER): Payer: BLUE CROSS/BLUE SHIELD | Admitting: "Endocrinology

## 2014-11-11 ENCOUNTER — Encounter: Payer: Self-pay | Admitting: "Endocrinology

## 2014-11-11 VITALS — BP 117/85 | HR 113 | Wt 116.0 lb

## 2014-11-11 DIAGNOSIS — I471 Supraventricular tachycardia: Secondary | ICD-10-CM | POA: Diagnosis not present

## 2014-11-11 DIAGNOSIS — E10649 Type 1 diabetes mellitus with hypoglycemia without coma: Secondary | ICD-10-CM

## 2014-11-11 DIAGNOSIS — IMO0002 Reserved for concepts with insufficient information to code with codable children: Secondary | ICD-10-CM

## 2014-11-11 DIAGNOSIS — E063 Autoimmune thyroiditis: Secondary | ICD-10-CM

## 2014-11-11 DIAGNOSIS — I10 Essential (primary) hypertension: Secondary | ICD-10-CM

## 2014-11-11 DIAGNOSIS — E1065 Type 1 diabetes mellitus with hyperglycemia: Secondary | ICD-10-CM | POA: Diagnosis not present

## 2014-11-11 DIAGNOSIS — I4711 Inappropriate sinus tachycardia, so stated: Secondary | ICD-10-CM

## 2014-11-11 DIAGNOSIS — R000 Tachycardia, unspecified: Secondary | ICD-10-CM

## 2014-11-11 DIAGNOSIS — E0500 Thyrotoxicosis with diffuse goiter without thyrotoxic crisis or storm: Secondary | ICD-10-CM

## 2014-11-11 DIAGNOSIS — R5383 Other fatigue: Secondary | ICD-10-CM

## 2014-11-11 DIAGNOSIS — E1043 Type 1 diabetes mellitus with diabetic autonomic (poly)neuropathy: Secondary | ICD-10-CM | POA: Diagnosis not present

## 2014-11-11 LAB — GLUCOSE, POCT (MANUAL RESULT ENTRY): POC Glucose: 289 mg/dl — AB (ref 70–99)

## 2014-11-11 MED ORDER — LISINOPRIL 2.5 MG PO TABS: 2.5000 mg | ORAL_TABLET | Freq: Every day | ORAL | Status: DC

## 2014-11-11 NOTE — Patient Instructions (Signed)
Follow up visit in 2 months in a new patient slot or as the last appointment of the day. In about one month please repeat blood tests at 8:30 AM.

## 2014-11-11 NOTE — Progress Notes (Signed)
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  HISTORY OF PRESENT ILLNESS: The patient is a 24 year-old Caucasian young woman. The patient was accompanied by her mother.  1. This very complicated 65 year-old Caucasian young lady was first referred to me on 09/25/06 by her primary care provider, Dr. Garnet Koyanagi, for evaluation and management of type I diabetes mellitus, hypoglycemia, and a myriad of other autoimmune issues.   A. CIDP: At about age 61 the child began to limp.  At age 19 she was evaluated at [facility] and diagnosed with CIDP. At the time I first met her she was under the care of Dr. Marcene Brawn of [facility] in [place]. She was on a regular regimen of intravenous treatments at home with IVIG and steroids on the days of therapy. The IVIG treatments were given monthly. She was also on cyclosporine A daily, alternating doses every other day. Because of the CIDP she had  significant problems with muscle atrophy and weakness, as well as multiple orthopedic issues. She was also being followed by Dr. Simonne Come, an orthopedist in [place]. On that first visit her legs and arms were quite weak. She found it very hard to walk or to exercise.  B. Type 1 diabetes mellitus: Her diabetes mellitus was also diagnosed at age 29, just prior to the diagnosis of CIDP. She had been started on a Medtronic Paradigm 712 insulin pump subsequently and was using Humalog lispro insulin in her pump. She had a very difficult time with blood glucose control. At the time of her steroids, her sugars tended to  run very high. However, when she was able to perform some physical activities, she tended to develop hypoglycemia. Her hemoglobin A1c on that first visit was 9.5%.  C. Celiac disease: The patient was diagnosed with celiac disease at approximately age 9. She had been on a gluten-free diet ever since.  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 She was followed [facility] at [facility] and [facility].  2. Since that initial visit with me, I have followed her for the problems listed above plus other new problems that have developed over time.   A. CIDP: Her CIDP course has waxed and waned. At one point she had improved significantly, her medication doses had been reduced, she felt very much stronger, had more stamina, and was doing very well. Unfortunately, she later had a significant relapse of CIDP and lost much of the improvement that she had gained. Since going back to [facility] in the spring of 2013, however, and starting a new and more intensified IVIG regimen, her strength had improved. She was still very weak physically and muscularly, however, and needed support to walk. As noted below, after tapering her steroids recently, her weakness had worsened.  B. Type 1 diabetes mellitus: The patient's blood sugars have varied significantly with the course of her CIDP and the treatments for that disease. Her hemoglobin A1c values have ranged from a low of 8.3% to a high of 14.0%. Most of her A1c values have been in the 8.4-9.6 range.   C. Celiac disease: The patient has  really done quite well over the years. She rarely has had signs or symptoms of abdominal problems. Because her antibody levels fluctuated a great deal, her celiac disease antibodies were negative in March 2012. She was mistakenly told that she did not have celiac disease and that she could resume a normal  diet. As expected, resumption of a normal diet caused a recurrence of active celiac disease. In October 2012 she saw a gastroenterologist, Dr. Gale Journey, at  [facility] who told her that she did have celiac disease. She is now back on her gluten-free diet and is asymptomatic again.  D. Autoimmune thyroid disease: The patient had a goiter and a TPO antibody level of 287.2 on her first visit in May of 2008, consistent with evolving Hashimoto's Disease. She occasionally had the sensation of swelling and discomfort in her anterior neck, which was also c/w flare-ups of Hashimoto's Disease. In early 2009 she developed increased tachycardia. Lab tests performed in March 2010 by her primary care provider showed a TSH of 0.01 and a T4 of 7.7. It was initially felt that the increase in tachycardia was likely due to a combination of autonomic neuropathy and Hashitoxicosis.  However on follow-up laboratory tests performed in July, her TSH was 0.05, free T4 3.9, and free T3 14.2. Her TSI level done on the assay used at the time was 2.2, with normal being 1.0 or less. It was then evident that she had developed Graves' Disease in the setting of pre-existing Hashimoto's Disease.  I initiated treatment with methimazole, 10 mg twice daily and propranolol 20 mg 3 times daily. Since then we've seen significant swings of her thyroid function tests, partly due to changes in methimazole doses, but also partly due to changes in her immunosuppressant therapies. During the last 2 years her TSH values have ranged from a low of 0.1432 to a high of 14.933. The dose of methimazole has been adjusted as needed to treat her Graves' disease. It has been my hope that her Hashimoto's disease will eventually cancel out her Graves' disease.   3.The patient's last [facility] visit was on 06/01/14. In the interim she has been healthy. Her energy level is "pretty good". She remains on her regimen of IVIG and steroids. Since [facility],  she has reduced her IVIG-glucocorticoid treatments to once a week. She stopped doing the 120% temporary basal rates during IVIG treatments several months ago, but can't really explain why. Since last visit her strength has been a little bit better. She still needs physical assistance with getting up from a chair and with walking.  She takes methimazole, 10 mg each morning and 5 mg each evening. She "dropped the ball" about taking lisinopril and hasn't taken it in quite some time. She also stopped taking ranitidine. She is still taking propranolol, gabapentin, iron, vitamin D, and a vitamin B complex pill. She has a "normal" appetite. Although weak, she has been getting out a fair amount. She now walks a lot at work, but has not been walking for exercise. 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 She uses a walker to help her walk from the parking lot to her job. She has not had many headaches. Dr. Gaynell Face is the local neurologist caring for her CIDP.  4. Pertinent Review of Systems: Constitutional: The patient says she feels "pretty good:, but also feels "like something is off." She still "tires more easily than the average person".  Her strength and weakness are better. Her stamina is better.  Eyes: Her contact lenses are helping. Last eye exam was last week in January 2016. There may have been some signs of early diabetic eye disease, but "nothing to worry about now". She is due for a FU exam in 12 months.   Neck: The patient has had no problems with anterior neck swelling and tenderness for a year or more.   Heart: The patient still has occasional sensation of fast heart rate lasting up to a few hours. Her heart rates during her iv treatments are usually about 80-90. The patient has no other complaints of palpitations, irregular heart beats, chest pain, or chest pressure.   Gastrointestinal:  Her upset stomach and dyspepsia have not bothered her recently, even after stopping  the ranitidine. Bowel movents seem normal. The patient has no complaints of excessive hunger, acid reflux, stomach aches or pains, diarrhea, or constipation.  Legs: Muscle mass and strength seem to be improved. There are few complaints of numbness, tingling, burning, or pain. No edema is noted.  Feet: There are no obvious new foot problems. There are no complaints of numbness, tingling, burning, or pain. No edema is noted. Neurologic: She requires somewhat less assistance with walking than she did at last visit. Sensation seems to be normal. Coordination is better since resuming IVIG. GYN: Her LMP was 2 weeks ago.  Menses have been regular.   Hypoglycemia: She has not had any low blood sugars.    5. Blood glucose printout: She changes her pump site every 3-5 days. She has been using her abdomen for all site changes. She says that when her BGs are in the low 100s she often does not input the values into her pump. Her documented BGs occur from 0-4 times daily. She sometimes goes for 24-40 hours without documented BG checks. She takes 4-8 boluses her day, many of which are just food boluses. Her BGs tend to be higher on the day of IVIG-steroid treatments and the following day. BGs tend to improve on the second day after the treatments. When her sites are working well and she checks BGs more frequently, her BGs can  be between 123-136.  A temporary basal rate of 120% for 8-12 hours at a time on the days of IVIG treatments and on the following days is still recommended.   PAST MEDICAL, FAMILY, AND SOCIAL HISTORY: 1. School: She will resume taking college courses on line in the Fall. She is working part-time now as noted above.      2. Activities: She has been walking less for exercise, but more at work. She will have follow up appointment at [facility] every 2 years. She enjoys her [place] shepherd puppy.  3. Smoking, alcohol, or drugs: None 4. Primary Care Provider: Dr. Garnet Koyanagi at [facility]. 5.  Neurologist: Dr. Gaynell Face  REVIEW OF SYSTEMS: There are no other significant problems involving the patient's other body systems.  PHYSICAL EXAM: BP 117/85 mmHg  Pulse 113  Wt 116 lb (52.617 kg) She is alert today. Her affect is normal. She looks more rested and healthier. She has lost 3 pounds since last visit. She has not been trying to lose weight.  Constitutional: This patient appears reasonably healthy, but seems tired. Her affect is fairly flat. She still needs help from mom to stand up from a sitting position and to walk down the hall. Her gait is very much affected by the fact that her right leg is  much weaker than her left.  Head: The head is normocephalic. Face: The face appears normal. There are no obvious dysmorphic features. Eyes: The eyes appear to be normally formed and spaced. Gaze is conjugate. There is no obvious arcus or proptosis. Moisture appears normal. Mouth: The oropharynx and tongue appear normal. Dentition appears to be normal for age. Oral moisture is normal. Neck: On inspection the thyroid gland appears to be normal in size. No carotid bruits are noted. The thyroid gland is a bit smaller at 23-24 grams in size, but the lobes and isthmus are still symmetrically enlarged. The thyroid gland is not tender to palpation. Lungs: The lungs are clear to auscultation. Air movement is good. Heart: Heart rate and rhythm are regular. Heart sounds S1 and S2 are normal. I did not appreciate any pathologic cardiac murmurs. Abdomen: The abdomen is normal in size for the patient's age. Bowel sounds are normal. There is no obvious hepatomegaly, splenomegaly, or other mass effect.  Arms: Muscle size and bulk are low-normal for age. Hands: There is a 2+ tremor today. Phalangeal and metacarpophalangeal joints are normal. Palmar muscles are low-normal for age. Palmar skin is normal. Palmar moisture is normal.. Legs: Muscles appear low-normal for age. No edema is present. Feet: Dorsalis pedal  pulses are 1+ bilaterally.    Neurologic: Strength is 4-5/5 for age in the upper extremities and 3/5 in the right leg and 4/5 in the left leg. Muscle tone is somewhat low. Sensation to touch is normal in both her legs and feet.    LAB DATA:  Labs 11/08/14: HbA1c 10.9%; TSH 2.234, free T4 0.87, free T3 2.7, TSI pending  Labs 06/01/14: HbA1c is 9.7% today, compared with 11.5% at last visit. Other labs are pending.  Labs 02/17/14: TSH 2.125, free T4 0.91, free T3 3.0  Labs 07/09/13; HbA1c was 11.4%, compared with 9.1% at last visit and with 14.0% at the visit prior. TSH was 0.652, free T4 0.88, free T3 3.3  Labs 10/06/12: TSH 0.088, free T4 1.28, free T3 3.1  Labs 02/25/12: TSH 1.044, free T4 1.45, free T3 3.4  Labs 06/14/11: TSH was 1.633. Free T4 1.47. Free T3 was 3.1. TSI was 56 (normal less than 120).                       ASSESSMENT: 1. Type 1 diabetes mellitus: Her BG control is worse. She is not having as many BGs > 400, but the BGs > 400 that she has usually occur on the days of IVIG treatments and on the next immediately following days, especially if she leaves her sites in too long. She needs to change her sites about every 3 days, check BGs more frequently, take more correction boluses, and use a 120% temporary basal rates on days of IVIG treatments and the next days after treatment.   2.  Autonomic neuropathy with tachycardia: The heart rate is higher, paralleling her increase in HbA1c. Her autonomic neuropathy and tachycardia will reverse when her BGs come under even better control.  3. Hypoglycemia: No documented cases in the past month    4-8 Autoimmune thyroid disease, Graves' disease, Hashimoto's disease, and goiter:   A. The patient was euthyroid in October 2015, the first time in 2 years, after increasing her MTZ to 10 mg in the mornings and 5 mg in the evenings. She appears clinically hypothyroid today.  Within her thyroid gland there is an on-going battle between her Graves Dz B  lymphocytes and her Hashimoto's Dz T lymphocytes.   B. Thyrotoxicosis: Her diffuse thyrotoxicosis, secondary to Graves' disease, has waxed and waned over time.  Her thyrotoxicosis was well controlled by her current methimazole doses at her last visit. Her current TFTs are not c/w hyperthyroidism.   C. Hashimoto's thyroiditis: The patient's thyroiditis is clinically quiescent at this time. However, the pattern in which all 3 of her TFTs decreased in parallel together since January indicates that she has had a relatively recent flare up of Hashimoto's Dz. Her TFTs have not had enough time to re-equilibrate. I expect that as her Hashimoto's disease progressively destroys more thyroid cells, we will be able to taper her methimazole to zero. She will probably become permanently hypothyroid over time and will require Synthroid hormone replacement.  D.Goiter: Thyroid is a bit smaller. The waxing and waning of thyroid lobe size is c/w evolving Hashimoto's disease.  9. Celiac disease: Patient is back on her gluten-free diet. She is slowly gaining weight and is asymptomatic. 10.  Chronic inflammatory demyelinating polyneuropathy (CIDP) and muscle weakness: She feels better subjectively. We'll see how she does over time on the regimen of only one IVIG -glucocorticoid treatment per week.     11-12. Fatigue and decreased stamina: These problems have worsened.Since she has been on a regimen of one iv glucocorticoid treatment for many months, I doubt that her adrenals are suppressed. However, it is reasonable to check her ACTH and cortisol at 8:30 AM. It is also reasonable to re-check a CBC and iron.  13. Emotionality: She feels that she is doing well. Mom concurs. I continue to be impressed and inspired by Heran's dignity and courage as she tried to do all that she can to improve her own health. 15. Dyspepsia: She is doing well, despite discontinuing ranitidine. We'll see how she does over time.  15. Hypertension:  Her BP is higher since stopping lisinopril. She needs to resume that medication and have her BPs monitored by her home health nurse. The nurse needs to send [place] a log of her BPs.Marland Kitchen    PLAN: 1. Diagnostic: HbA1c today. TFTs, CBC, CMP, iron, ACTH, and cortisol at 8:30 AM in one month.   2. Therapeutic:   A. Continue current pump settings. Change sites every 3 days, especially on days of IVIG therapy.   B. On the day of IVIG and steroids, use a temporary basal rate of 120% for 4-8 hours, repeating every 4-8 hours for up to 48 hours as needed.  C. Resume lisinopril, 2.5 mg/day.  3. Patient education: We discussed the issue of using an increased temporary basal rate for 48 hours after taking steroids. She needs to be as consistent as possible with pump site changes, BG checks, and insulin boluses.  The more consistent she can be, the fewer variables she will have to deal with.  4. Follow-up: 2 months in a New Patient slot or last appointment of the day slot.  Level of Service: This visit lasted in excess of 60 minutes. More than 50% of the visit was devoted to counseling.  Sherrlyn Hock  Adult and Pediatric Endocrinology

## 2014-11-13 LAB — THYROID STIMULATING IMMUNOGLOBULIN: TSI: 40 %{baseline} (ref <140)

## 2014-12-01 ENCOUNTER — Other Ambulatory Visit: Payer: Self-pay | Admitting: "Endocrinology

## 2014-12-23 ENCOUNTER — Telehealth: Payer: Self-pay | Admitting: Family

## 2014-12-23 NOTE — Telephone Encounter (Signed)
I agree with this plan. Thanks

## 2014-12-23 NOTE — Telephone Encounter (Signed)
Mom Georgana Curio left message about Kailena. Mom said that Lyndzie has been feeling weaker as she has been receiving IVIG once per week since mid-June and feels that she needs to return to twice per week administration. Mom asked would Dr Gaynell Face send in orders for that to be done on Mondays and Thursdays again? I called [facility] and gave them the order to return to twice per week dosing. I called Mom and let her know. Mom's # is [phone]. TG

## 2015-01-01 ENCOUNTER — Other Ambulatory Visit: Payer: Self-pay | Admitting: "Endocrinology

## 2015-01-01 ENCOUNTER — Other Ambulatory Visit: Payer: Self-pay | Admitting: Family

## 2015-01-02 ENCOUNTER — Other Ambulatory Visit: Payer: Self-pay | Admitting: "Endocrinology

## 2015-01-06 ENCOUNTER — Telehealth: Payer: Self-pay

## 2015-01-06 MED ORDER — PREDNISONE 20 MG PO TABS: ORAL_TABLET | ORAL | Status: DC

## 2015-01-06 NOTE — Telephone Encounter (Signed)
Lvm letting Kaisa know the Rx was sent to the pharmacy.

## 2015-01-06 NOTE — Telephone Encounter (Signed)
Prescription was electronically sent, please call mom or Jersi.

## 2015-01-06 NOTE — Telephone Encounter (Signed)
Lauren, mom, lvm stating that patient is going to be taking IVIG treatments on Thursdays.  Keerthana needs refill on the Prednisone 20 mg tabs to prevent her from getting headaches from the treatments. Mother said that we can call her or Zandra with any questions. Lauren's number is: [phone]. Lauren's number is: [phone].

## 2015-01-11 ENCOUNTER — Ambulatory Visit: Payer: BLUE CROSS/BLUE SHIELD | Admitting: "Endocrinology

## 2015-02-24 ENCOUNTER — Other Ambulatory Visit: Payer: Self-pay | Admitting: "Endocrinology

## 2015-03-24 ENCOUNTER — Other Ambulatory Visit: Payer: Self-pay | Admitting: "Endocrinology

## 2015-03-29 ENCOUNTER — Other Ambulatory Visit: Payer: Self-pay | Admitting: "Endocrinology

## 2015-04-07 ENCOUNTER — Telehealth: Payer: Self-pay | Admitting: "Endocrinology

## 2015-04-07 NOTE — Telephone Encounter (Signed)
Labs in portal

## 2015-04-08 LAB — COMPREHENSIVE METABOLIC PANEL
ALBUMIN: 2.9 g/dL — AB (ref 3.6–5.1)
ALT: 11 U/L (ref 6–29)
AST: 19 U/L (ref 10–30)
Alkaline Phosphatase: 56 U/L (ref 33–115)
BUN: 11 mg/dL (ref 7–25)
CALCIUM: 8.7 mg/dL (ref 8.6–10.2)
CHLORIDE: 97 mmol/L — AB (ref 98–110)
CO2: 25 mmol/L (ref 20–31)
CREATININE: 0.64 mg/dL (ref 0.50–1.10)
Glucose, Bld: 159 mg/dL — ABNORMAL HIGH (ref 70–99)
Potassium: 4 mmol/L (ref 3.5–5.3)
Sodium: 132 mmol/L — ABNORMAL LOW (ref 135–146)
TOTAL PROTEIN: 10.8 g/dL — AB (ref 6.1–8.1)
Total Bilirubin: 0.4 mg/dL (ref 0.2–1.2)

## 2015-04-08 LAB — TSH: TSH: 1.826 u[IU]/mL (ref 0.350–4.500)

## 2015-04-08 LAB — T3, FREE: T3, Free: 2.8 pg/mL (ref 2.3–4.2)

## 2015-04-08 LAB — CBC
HEMATOCRIT: 43 % (ref 36.0–46.0)
Hemoglobin: 13.9 g/dL (ref 12.0–15.0)
MCH: 29.8 pg (ref 26.0–34.0)
MCHC: 32.3 g/dL (ref 30.0–36.0)
MCV: 92.3 fL (ref 78.0–100.0)
MPV: 12 fL (ref 8.6–12.4)
Platelets: 226 10*3/uL (ref 150–400)
RBC: 4.66 MIL/uL (ref 3.87–5.11)
RDW: 13.4 % (ref 11.5–15.5)
WBC: 4.1 10*3/uL (ref 4.0–10.5)

## 2015-04-08 LAB — IRON: Iron: 234 ug/dL — ABNORMAL HIGH (ref 40–190)

## 2015-04-08 LAB — CORTISOL: Cortisol, Plasma: 10.5 ug/dL

## 2015-04-08 LAB — T4, FREE: FREE T4: 1.06 ng/dL (ref 0.80–1.80)

## 2015-04-10 LAB — ACTH: C206 ACTH: 11 pg/mL (ref 6–50)

## 2015-04-12 LAB — THYROID STIMULATING IMMUNOGLOBULIN: TSI: 156 % baseline — ABNORMAL HIGH (ref <140)

## 2015-04-17 ENCOUNTER — Encounter: Payer: Self-pay | Admitting: *Deleted

## 2015-04-25 ENCOUNTER — Other Ambulatory Visit: Payer: Self-pay | Admitting: "Endocrinology

## 2015-05-08 ENCOUNTER — Ambulatory Visit: Payer: BLUE CROSS/BLUE SHIELD | Admitting: "Endocrinology

## 2015-05-09 ENCOUNTER — Ambulatory Visit (INDEPENDENT_AMBULATORY_CARE_PROVIDER_SITE_OTHER): Payer: BLUE CROSS/BLUE SHIELD | Admitting: "Endocrinology

## 2015-05-09 ENCOUNTER — Encounter: Payer: Self-pay | Admitting: "Endocrinology

## 2015-05-09 VITALS — BP 119/83 | HR 94 | Wt 116.6 lb

## 2015-05-09 DIAGNOSIS — E049 Nontoxic goiter, unspecified: Secondary | ICD-10-CM

## 2015-05-09 DIAGNOSIS — E063 Autoimmune thyroiditis: Secondary | ICD-10-CM | POA: Diagnosis not present

## 2015-05-09 DIAGNOSIS — K900 Celiac disease: Secondary | ICD-10-CM

## 2015-05-09 DIAGNOSIS — G6181 Chronic inflammatory demyelinating polyneuritis: Secondary | ICD-10-CM

## 2015-05-09 DIAGNOSIS — E10649 Type 1 diabetes mellitus with hypoglycemia without coma: Secondary | ICD-10-CM | POA: Diagnosis not present

## 2015-05-09 DIAGNOSIS — IMO0001 Reserved for inherently not codable concepts without codable children: Secondary | ICD-10-CM

## 2015-05-09 DIAGNOSIS — E109 Type 1 diabetes mellitus without complications: Secondary | ICD-10-CM | POA: Diagnosis not present

## 2015-05-09 DIAGNOSIS — I10 Essential (primary) hypertension: Secondary | ICD-10-CM

## 2015-05-09 DIAGNOSIS — E1065 Type 1 diabetes mellitus with hyperglycemia: Principal | ICD-10-CM

## 2015-05-09 DIAGNOSIS — E0500 Thyrotoxicosis with diffuse goiter without thyrotoxic crisis or storm: Secondary | ICD-10-CM | POA: Diagnosis not present

## 2015-05-09 DIAGNOSIS — R5383 Other fatigue: Secondary | ICD-10-CM

## 2015-05-09 LAB — GLUCOSE, POCT (MANUAL RESULT ENTRY): POC GLUCOSE: 259 mg/dL — AB (ref 70–99)

## 2015-05-09 LAB — POCT GLYCOSYLATED HEMOGLOBIN (HGB A1C): HEMOGLOBIN A1C: 11.1

## 2015-05-09 NOTE — Patient Instructions (Signed)
Follow up visit with me in 2 months in a new patient slot either before lunch or at the end of the day.

## 2015-05-09 NOTE — Progress Notes (Signed)
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  HISTORY OF PRESENT ILLNESS: The patient is a 24 year-old Caucasian young woman. The patient was accompanied by her mother.  1. This very complicated 35 year-old Caucasian young lady was first referred to me on 09/25/06 by her primary care provider, Dr. Garnet Koyanagi, for evaluation and management of type I diabetes mellitus, hypoglycemia, and a myriad of other autoimmune issues.   A. CIDP: At about age 54 the child began to limp.  At age 75 she was evaluated at [facility] and diagnosed with CIDP. At the time I first met her she was under the care of Dr. Marcene Brawn of [facility] in [place]. She was on a regular regimen of intravenous treatments at home with IVIG and steroids on the days of therapy. The IVIG treatments were given monthly. She was also on cyclosporine A daily, alternating doses every other day. Because of the CIDP she had  significant problems with muscle atrophy and weakness, as well as multiple orthopedic issues. She was also being followed by Dr. Simonne Come, an orthopedist in [place]. On that first visit her legs and arms were quite weak. She found it very hard to walk or to exercise.  B. Type 1 diabetes mellitus: Her diabetes mellitus was also diagnosed at age 81, just prior to the diagnosis of CIDP. She had been started on a Medtronic Paradigm 712 insulin pump subsequently and was using Humalog lispro insulin in her pump. She had a very difficult time with blood glucose control. At the time of her steroids, her sugars tended to  run very high. However, when she was able to perform some physical activities, she tended to develop hypoglycemia. Her hemoglobin A1c on that first visit was 9.5%.  C. Celiac disease: The patient was diagnosed with celiac disease at approximately age 62. She had been on a gluten-free diet ever since.  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 She was followed both at [facility] and [facility].  2. Since that initial visit with me, I have followed her for the problems listed above plus other new problems that have developed over time.   A. CIDP: Her CIDP course has waxed and waned. At one point she had improved significantly, her medication doses had been reduced, she felt very much stronger, had more stamina, and was doing very well. Unfortunately, she later had a significant relapse of CIDP and lost much of the improvement that she had gained. Since going back to [facility] in the spring of 2013, however, and starting a new and more intensified IVIG regimen, her strength had improved. She was still very weak physically and muscularly, however, and needed support to walk. As noted below, after tapering her steroids recently, her weakness had worsened.  B. Type 1 diabetes mellitus: The patient's blood sugars have varied significantly with the course of her CIDP and the treatments for that disease. Her hemoglobin A1c values have ranged from a low of 8.3% to a high of 14.0%. Most of her A1c values have been in the 8.4-9.6% range.   C. Celiac disease: The patient has  really done quite well over the years. She rarely has had signs or symptoms of abdominal problems. Because her antibody levels fluctuated a great deal, her celiac disease antibodies were negative in March 2012. She was mistakenly told that she did not have celiac disease and that she could resume a  normal diet. As expected, resumption of a normal diet caused a recurrence of active celiac disease. In October 2012 she saw a gastroenterologist, Dr. Gale Journey, at [facility] who told her that she did have celiac disease. She is now back on her gluten-free diet and is asymptomatic again.  D. Autoimmune thyroid disease: The patient had a goiter and a TPO antibody level of 287.2 on her first visit in May of 2008, consistent with evolving Hashimoto's Disease. She occasionally had the sensation of swelling and discomfort in her anterior neck, which was also c/w flare-ups of Hashimoto's Disease. In early 2009 she developed increased tachycardia. Lab tests performed in March 2010 by her primary care provider showed a TSH of 0.01 and a T4 of 7.7. It was initially felt that the increase in tachycardia was likely due to a combination of autonomic neuropathy and Hashitoxicosis.  However on follow-up laboratory tests performed in July, her TSH was 0.05, free T4 3.9, and free T3 14.2. Her TSI level done on the assay used at the time was 2.2, with normal being 1.0 or less. It was then evident that she had developed Graves' Disease in the setting of pre-existing Hashimoto's Disease.  I initiated treatment with methimazole, 10 mg twice daily and propranolol 20 mg 3 times daily. Since then we've seen significant swings of her thyroid function tests, partly due to changes in methimazole doses, but also partly due to changes in her immunosuppressant therapies. During the last 2 years her TSH values have ranged from a low of 0.1432 to a high of 14.933. The dose of methimazole has been adjusted as needed to treat her Graves' disease. It has been my hope that her Hashimoto's disease will eventually cancel out her Graves' disease.   3.The patient's last [facility] visit was on 11/11/14. In the interim she has been healthy. Her energy level is "pretty good". She remains on her regimen of IVIG and steroids. She remains on methimazole, 10 mg each morning  and 5 mg each evening. She resumed taking lisinopril. She is still taking propranolol, gabapentin, iron, vitamin D, and a vitamin B complex pill. She has a "normal" appetite. Although weak, she has been getting out a fair amount. She now walks a lot at work, but has not been walking for exercise. 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 She uses a walker to help her walk from the parking lot to her job. She has not had many headaches. Dr. Gaynell Face is the local neurologist caring for her CIDP.  4. Pertinent Review of Systems: Constitutional: The patient says she feels "pretty decent". She still feels tired often. Her strength and weakness are better. Her stamina is better.  Eyes: Her contact lenses are helping. Last eye exam was in January 2016. There may have been some signs of early diabetic eye disease, but "nothing to worry about now". She is due for a FU exam soon.   Neck: The patient has had no problems with anterior neck swelling and tenderness for a year or more.   Heart: The patient still has occasional sensation of fast heart rate lasting up to a few hours. Her heart rates during  her iv treatments are usually about 80-90. The patient has no other complaints of palpitations, irregular heart beats, chest pain, or chest pressure.   Gastrointestinal:  Bowel movents seem normal. The patient has no complaints of excessive hunger, acid reflux, upset stomach, stomach aches or pains, diarrhea, or constipation.  Legs: Muscle mass and strength seem to be improved. There are few complaints of numbness, tingling, burning, or pain. No edema is noted.  Feet: There are no obvious new foot problems. There are no complaints of numbness, tingling, burning, or pain. No edema is noted. Neurologic: She requires somewhat less assistance with walking than she did at her last visit. Sensation seems to be normal. Coordination is better since resuming IVIG. GYN: Her LMP was this week. Menses  have been regular.   Hypoglycemia: She has had a few low BGs.   5. Blood glucose printout: She changes her pump site every 3-6 days. She has been using her abdomen for all site changes. According to her pump she sometimes goes for up to 4 days without entering BG data. She say that she checks her BGs more frequently, but often does not input the numbers. She is missing many correction boluses, sometimes for up to 4 days at a time. Her average BG is 232.  When her sites are working well and she checks BGs more frequently and takes boluses accordingly, her BGs can  be between 125-176.    PAST MEDICAL, FAMILY, AND SOCIAL HISTORY: 1. School: She resumed taking college courses on line in the Fall. She is working part-time now, but up to 45 hours per week.       2. Activities: She has been walking less for exercise, but more at work. She will have a follow up appointment at [facility] every 2 years. She has a new [place] shepherd.  3. Smoking, alcohol, or drugs: None 4. Primary Care Provider: Dr. Garnet K[facility]anagi at [facility]. 5. Neurologist: Dr. Wyline Copas  REVIEW OF SYSTEMS: There are no other significant problems involving the patient's other body systems.  PHYSICAL EXAM: BP 119/83 mmHg  Pulse 94  Wt 116 lb 9.6 oz (52.889 kg) She is alert today. Her affect is normal. She looks more rested and healthier. She has gained 9.6 oz. since last visit.  Constitutional: This patient appears reasonably healthy, but seems tired. Her affect is still somewhat flat, but she brightened up when talking about her new dog. She still needs help from mom to stand up from a sitting position and to walk down the hall. Her gait is very much affected by the fact that her right leg is much weaker than her left.  Head: The head is normocephalic. Face: The face appears normal. There are no obvious dysmorphic features. Eyes: The eyes appear to be normally formed and spaced. Gaze is conjugate. There is no obvious arcus or  proptosis. Moisture appears normal. Mouth: The oropharynx and tongue appear normal. Dentition appears to be normal for age. Oral moisture is normal. Neck: On inspection the thyroid gland appears to be normal in size. No carotid bruits are noted. The thyroid gland is a bit smaller at 22-23 grams in size, but the lobes and isthmus are still symmetrically enlarged. The thyroid gland is not tender to palpation. Lungs: The lungs are clear to auscultation. Air movement is good. Heart: Heart rate and rhythm are regular. Heart sounds S1 and S2 are normal. I did not appreciate any pathologic cardiac murmurs. Abdomen: The abdomen is normal in size  for the patient's age. Bowel sounds are normal. There is no obvious hepatomegaly, splenomegaly, or other mass effect.  Arms: Muscle size and bulk are low-normal for age. Hands: There is no tremor today. Phalangeal and metacarpophalangeal joints are normal. Palmar muscles are low-normal for age. Palmar skin is normal. Palmar moisture is normal.. Legs: Muscles appear low-normal for age. No edema is present. Feet: Dorsalis pedal pulses are 1+ bilaterally.    Neurologic: Strength is 4-5/5 for age in the upper extremities and 3/5 in the right leg and 4/5 in the left leg. Muscle tone is somewhat low. Sensation to touch is normal in both her legs and feet.    LAB DATA:  Labs 05/09/15: HbA1c 11.1%  Labs 04/07/15: CBC normal; TSH 1.826, free T4 1.06, free T3 2.8, TSI 156; iron 234, CMP normal except for sodium 132, glucose 159, albumin 2.9, calcium 8.7; ACTH 11, cortisol 10.5  Labs 11/08/14: HbA1c 10.9%; TSH 2.234, free T4 0.87, free T3 2.7, TSI pending  Labs 06/01/14: HbA1c is 9.7% today, compared with 11.5% at last visit. Other labs are pending.  Labs 02/17/14: TSH 2.125, free T4 0.91, free T3 3.0  Labs 07/09/13; HbA1c was 11.4%, compared with 9.1% at last visit and with 14.0% at the visit prior. TSH was 0.652, free T4 0.88, free T3 3.3  Labs 10/06/12: TSH 0.088, free  T4 1.28, free T3 3.1  Labs 02/25/12: TSH 1.044, free T4 1.45, free T3 3.4  Labs 06/14/11: TSH was 1.633. Free T4 1.47. Free T3 was 3.1. TSI was 56 (normal less than 120).                       ASSESSMENT: 1. Type 1 diabetes mellitus: Her BG control is worse, although s She is not having as many BGs > 400. She is not inputting her BG data and is not taking enough correction boluses. She is also leaving sites in too long.    2.  Autonomic neuropathy with tachycardia: The heart rate is lower, paralleling her lower A1c at last visit. Her autonomic neuropathy and tachycardia will reverse when her BGs come under even better control.  3. Hypoglycemia: She had one documented case in the past month.    4-8 Autoimmune thyroid disease, Graves' disease, Hashimoto's disease, and goiter:   A. The patient was euthyroid in October 2015, the first time in 2 years, after increasing her MTZ to 10 mg in the mornings and 5 mg in the evenings. She appears clinically hypothyroid today.  Within her thyroid gland there is an on-going battle between her Graves Dz B lymphocytes and her Hashimoto's Dz T lymphocytes. Her TFTS last month were mid-range normal. Her current MTZ doses are working for her.  B. Thyrotoxicosis: Her diffuse thyrotoxicosis, secondary to Graves' disease, has waxed and waned over time.  Her thyrotoxicosis was well controlled by her current methimazole doses at her last visit. Her current TSI level is still mildly elevated.  C. Hashimoto's thyroiditis: The patient's thyroiditis is clinically quiescent at this time. However, the pattern in which all 3 of her TFTs decreased in parallel together from 2013 to 2014 indicated that she had had a relatively recent flare up of Hashimoto's Dz. I expect that as her Hashimoto's disease progressively destroys more thyroid cells, we will be able to taper her methimazole to zero. She will probably become permanently hypothyroid over time and will require Synthroid hormone  replacement.  D.Goiter: Thyroid is a bit smaller. The waxing and  waning of thyroid lobe size is c/w evolving Hashimoto's disease.  9. Celiac disease: Patient is pretty much back on her gluten-free diet. She is slowly gaining weight and is asymptomatic. 10.  Chronic inflammatory demyelinating polyneuropathy (CIDP) and muscle weakness: She feels better subjectively. We'll see how she does over time on the regimen of only one IVIG-glucocorticoid treatment per week.     11-12. Fatigue and decreased stamina: These problems are somewhat better. Since she has been on a regimen of one iv glucocorticoid treatment for many months, it was possible that her adrenals were suppressed. However, her recent ACTH and cortisol levels were normal. Her CBC was normal. Her iron was high. She can stop the iron now. .  13. Emotionality: She feels that she is doing well. Mom concurs. I continue to be impressed and inspired by Rosamae's dignity and courage as she tried to do all that she can to improve her own health. 15. Dyspepsia: She is doing well, despite discontinuing ranitidine. We'll see how she does over time.  15. Hypertension: Her DBP is still somewhat high despite starting low-dose lisinopril. Her home health nurse checks her BPs at home and they are lower.     PLAN: 1. Diagnostic: HbA1c today.  2. Therapeutic:   A. Continue current pump settings. Change sites every 3 days, especially on days of IVIG therapy.   B. On the day of IVIG and steroids, use a temporary basal rate of 120% for 4-8 hours, repeating every 4-8 hours for up to 48 hours as needed.  C. Resume lisinopril, 2.5 mg/day.   D. Check BGs, input data,and take correction boluses. Call Dr. Tobe Sos when you have about a week of good BG data.  3. Patient education: We discussed the issue of using an increased temporary basal rate for 48 hours after taking steroids. She needs to be as consistent as possible with pump site changes, BG checks, and insulin  boluses.  The more consistent she can be, the fewer variables she will have to deal with.  4. Follow-up: 2 months in a New Patient slot or last appointment of the day slot so that we will have more time to address her issues.  Level of Service: This visit lasted in excess of 60 minutes. More than 50% of the visit was devoted to counseling.  Sherrlyn Hock, MD, CDE Adult and Pediatric Endocrinology

## 2015-05-26 ENCOUNTER — Other Ambulatory Visit: Payer: Self-pay | Admitting: "Endocrinology

## 2015-06-01 ENCOUNTER — Ambulatory Visit (INDEPENDENT_AMBULATORY_CARE_PROVIDER_SITE_OTHER): Payer: BLUE CROSS/BLUE SHIELD | Admitting: Medical

## 2015-06-01 ENCOUNTER — Encounter: Payer: Self-pay | Admitting: Medical

## 2015-06-01 VITALS — BP 118/76 | HR 130 | Temp 98.5°F | Ht 63.5 in | Wt 119.0 lb

## 2015-06-01 DIAGNOSIS — H6690 Otitis media, unspecified, unspecified ear: Secondary | ICD-10-CM | POA: Diagnosis not present

## 2015-06-01 DIAGNOSIS — J0100 Acute maxillary sinusitis, unspecified: Secondary | ICD-10-CM

## 2015-06-01 MED ORDER — HYDROCODONE-HOMATROPINE 5-1.5 MG/5ML PO SYRP: 5.0000 mL | ORAL_SOLUTION | Freq: Three times a day (TID) | ORAL | Status: DC | PRN

## 2015-06-01 MED ORDER — AZITHROMYCIN 250 MG PO TABS: ORAL_TABLET | ORAL | Status: DC

## 2015-06-01 NOTE — Progress Notes (Signed)
Pre visit review using our clinic review tool, if applicable. No additional management support is needed unless otherwise documented below in the visit note. 

## 2015-06-01 NOTE — Progress Notes (Signed)
   Subjective:    Patient ID: Jessica Kelley, female    DOB: 03/22/1991, 25 y.o.   MRN: [id number]  HPI   Pt in for cough that has been annoying. Just won't go away. Pt feels mild pnd.Some mucous from nose if blows nose. Pt symptoms for about 3 wks. No fevers, no chills or sweats. No ear pain. At times will have productive cough. No sob or wheezing reported.  Pt pulse is high. Always runs high and she did not take her propanolol. Both mother and pt state that this is often her pattern if not taking propranolol. Also mother describes she had had cardiac work up. She describes prior Holter monitor.(pt on exam did not appear to have evidence of sepiss or dehydration)   Review of Systems  Constitutional: Negative for fever, chills and fatigue.  HENT: Positive for postnasal drip. Negative for congestion, ear pain, hearing loss and sneezing.   Respiratory: Positive for cough. Negative for chest tightness, shortness of breath and wheezing.        Rare productive cough. But a lot of dry hacky cough.  Cardiovascular: Negative for chest pain and palpitations.  Gastrointestinal: Negative for abdominal pain.  Neurological: Negative for dizziness, weakness, numbness and headaches.       Objective:   Physical Exam  General  Mental Status - Alert. General Appearance - Well groomed. Not in acute distress.  Skin Rashes- No Rashes.  HEENT Head- Normal. Ear Auditory Canal - Left- Normal. Right - Normal.Tympanic Membrane- Left- Normal. Right- mild red dull in center. Eye Sclera/Conjunctiva- Left- Normal. Right- Normal. Nose & Sinuses Nasal Mucosa- Left-  Boggy and Congested. Right-  Boggy and  Congested.Bilateral  No maxillary and no  frontal sinus pressure(sounds congested). Mouth & Throat Lips: Upper Lip- Normal: no dryness, cracking, pallor, cyanosis, or vesicular eruption. Lower Lip-Normal: no dryness, cracking, pallor, cyanosis or vesicular eruption. Buccal Mucosa- Bilateral- No Aphthous  ulcers. Oropharynx- No Discharge or Erythema. +pnd. Tonsils: Characteristics- Bilateral- No Erythema or Congestion. Size/Enlargement- Bilateral- No enlargement. Discharge- bilateral-None.  Neck Neck- Supple. No Masses.   Chest and Lung Exam Auscultation: Breath Sounds:-Clear even and unlabored.  Cardiovascular Auscultation:Rythm- Regular, rate and rhythm. Murmurs & Other Heart Sounds:Ausculatation of the heart reveal- No Murmurs.  Lymphatic Head & Neck General Head & Neck Lymphatics: Bilateral: Description- No Localized lymphadenopathy.       Assessment & Plan:  Your rt ear appears early infected. Also after 3 weeks of symptoms concerned for sinusitis and bronchitis.  I am prescribing azithromycin antibiotic.  Your can try netty pot to clear nose.  Hycodan cough syrup. Check with your Eye MD if this is ok.  Follow up 7 days or as needed  Your heart rate is elevated today. Please restart your propranolol.

## 2015-06-01 NOTE — Patient Instructions (Addendum)
Your rt ear appears early infected. Also after 3 weeks of symptoms concerned for sinusitis and bronchitis.  I am prescribing azithromycin antibiotic.  Your can try netty pot to clear nose.  Hycodan cough syrup. Check with your Eye MD if this is ok.  Follow up 7 days or as needed  Your heart rate is elevated today. Please restart your propranolol.

## 2015-06-02 ENCOUNTER — Telehealth: Payer: Self-pay | Admitting: "Endocrinology

## 2015-06-02 ENCOUNTER — Other Ambulatory Visit: Payer: Self-pay | Admitting: *Deleted

## 2015-06-02 ENCOUNTER — Other Ambulatory Visit: Payer: Self-pay | Admitting: "Endocrinology

## 2015-06-02 DIAGNOSIS — E0500 Thyrotoxicosis with diffuse goiter without thyrotoxic crisis or storm: Secondary | ICD-10-CM

## 2015-06-02 MED ORDER — METHIMAZOLE 5 MG PO TABS: ORAL_TABLET | ORAL | Status: DC

## 2015-06-02 NOTE — Telephone Encounter (Signed)
Made in error. Jessica Kelley

## 2015-06-05 ENCOUNTER — Telehealth: Payer: Self-pay | Admitting: *Deleted

## 2015-06-05 NOTE — Telephone Encounter (Signed)
I reviewed this on websites and do not see evidence of issues with neuromuscular transmission. I called mother and relayed this information.

## 2015-06-05 NOTE — Telephone Encounter (Signed)
Mom, Lauren, left a message wanting Dr. Gaynell Face approval for Jessica Kelley to take Z-Pak.  She has a sinus infection and she does not recall every taking an antibiotic.  She wants to make sure it is ok with her other medications.  They are going to hold off starting the medication until they hear back from Dr. Gaynell Face.  She can be reached at [phone].

## 2015-07-17 ENCOUNTER — Telehealth: Payer: Self-pay | Admitting: Family Medicine

## 2015-07-17 NOTE — Telephone Encounter (Signed)
LVM inquiring if patient received flu shot

## 2015-07-27 ENCOUNTER — Telehealth: Payer: Self-pay

## 2015-07-27 ENCOUNTER — Encounter: Payer: Self-pay | Admitting: Pediatrics

## 2015-07-27 ENCOUNTER — Ambulatory Visit: Payer: BLUE CROSS/BLUE SHIELD | Admitting: "Endocrinology

## 2015-07-27 NOTE — Telephone Encounter (Signed)
Patient's mother called stating that the patient's job is giving her a hard time at work. She states that they finally got her service dog that they have been trying to get for a year. She states that [employer] does not think she needs her service dog but she does. They are requesting a letter stating that she is in need of her service dog. Mom states that she has fallen at least 4 times due to Human resources not allowing her to have her dog. She is asking for the letter to be sent to [email or web address]. Directly to [employer] with all of the information they need. She states that if you need to call them please do.  CB:[phone] or [phone]

## 2015-07-27 NOTE — Telephone Encounter (Signed)
I emailed this letter.  To Whom It May Concern:  Re: Jessica Kelley (DOB: [birth year])  Jessica Kelley has been a patient of mine for over 10 years.  She has a condition known as chronic inflammatory demyelinating polyneuropathy.  This is an autoimmune disorder that attacks the myelin which is a covering of peripheral nerves.  This causes significant weakness in skeletal muscles, both close to the trunk and those farther away.  Jessica Kelley is treated with a variety of medications to alter the immune disorder.  This has kept it from progressing to the point where she is unable to walk.  Despite this, she has significant weakness in her legs more so than her arms.  This affects her ability to walk with stability.  She also has very significant fatigability and has to rest periodically.  As part of her therapy we recommended a service dog who could help stabilize her when she loses her balance.  This has worked extremely well.  It is come to my attention that [employer] has questioned the need for a service dog.  Jessica Kelley's physical condition has not significantly changed since she was hired to do this job.  Her weakness is well known to her managers and fellow employees.  Since she was prohibited from bringing the dog to work, she has fallen on at least 4 occasions, fortunately not seriously injuring herself.  It is medically necessary for Jessica Kelley to have her dog at work to stabilize her balance and prevent falls.  Jessica Kelley is a very bright young woman, able to perform the activities of her job.  She needs the dog to keep her safe.  The dog does not present any risk to customers or other employees.  If you have questions or require further information, I would be happy to provide that.         Sincerely yours,          Jessica Kelley. Jessica Kelley M.D.

## 2015-07-28 ENCOUNTER — Other Ambulatory Visit: Payer: Self-pay | Admitting: "Endocrinology

## 2015-08-18 ENCOUNTER — Encounter: Payer: Self-pay | Admitting: Pediatrics

## 2015-08-18 ENCOUNTER — Ambulatory Visit (INDEPENDENT_AMBULATORY_CARE_PROVIDER_SITE_OTHER): Payer: BLUE CROSS/BLUE SHIELD | Admitting: Pediatrics

## 2015-08-18 VITALS — BP 120/90 | HR 92 | Ht 63.5 in | Wt 118.2 lb

## 2015-08-18 DIAGNOSIS — G6181 Chronic inflammatory demyelinating polyneuritis: Secondary | ICD-10-CM

## 2015-08-18 DIAGNOSIS — M4140 Neuromuscular scoliosis, site unspecified: Secondary | ICD-10-CM | POA: Diagnosis not present

## 2015-08-18 DIAGNOSIS — R269 Unspecified abnormalities of gait and mobility: Secondary | ICD-10-CM

## 2015-08-18 NOTE — Progress Notes (Signed)
Patient: Jessica Kelley MRN: [id number] Sex: female DOB: 11-12-1990  Provider: Jodi Geralds, MD Location of Care: [facility]  Note type: Routine return visit  History of Present Illness: Referral Source: Dr. Garnet Koyanagi History from: mother, patient and [facility] chart Chief Complaint: Chronic Inflammatory deymlnating polyneuritis  Jessica Kelley is a 25 y.o. female who was evaluated August 18, 2015, for the first time since July 12, 2014.  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  She has responded to immune therapy with IVIG and Solu-Medrol.  This has provided stability in her strength, but she has not significantly improved over the years that I have followed her.  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  She has to walk with a walker.  She has a rescue dog who is able to pick up things that she drops and hand them to her in his mouth and is also able to get underneath her if she trips and falls to break her fall.  She works at [employer] in a part-time job and has done her job well.  Her dog was brought to work on one occasion for a training session.  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  This apparently was alarming to her and the senior management raised a number of questions and banned the dog from accompanying her to work until this issue could be adjudicated.  I have not seen her in nearly a year when questions were raised and stated that was imperative that I examined her before I could write a letter on her behalf.  After assessing her, her exam is very similar to that of a year ago.  She has very good strength in her upper extremities,  particularly distally and has adequate strength proximally.  In her  lower extremities, she has moderate weakness on the left and severe weakness on the right.  She has a diplegic gait with steppage right greater than left external rotation of her legs.  She is unable to walk without her walker and when she does, it stabilizes her gait.  There have been sometimes over the past year when she has not been able to have her full treatment of IVIG and Solu-Medrol.  Fortunately as long as this is relatively brief she does not experience relapse.  When she misses a week of doses, she tends to weaken probably.  She attends [employer] and is doing well in school.  She hopes to go to a four-year college, but in all likelihood she is going to have to pay her way and so she has to earn money in order to do that first.  Review of Systems: 12 system review was assessed and was negative except as noted above  Past Medical History Diagnosis Date  . Diabetes type I ([place])     Dr. Tobe Sos  . Celiac disease   . Macular degeneration   . Hypothyroidism   . Wyburn-Mason syndrome   . Glaucoma   . Scoliosis   . CIDP (chronic inflammatory demyelinating polyneuropathy) ([place]) 2001  . Pilonidal cyst    Hospitalizations: No., Head Injury: No., Nervous System Infections: No., Immunizations up to date: Yes.    Birth History 7 pound 9 ounce infant born at full-term.  Gestation  was uncomplicated.  Labor lasted 23 hours.  Operative delivery with forceps.  Nursery course was uneventful.  Growth and development is recalled as normal.  Behavior History none  Surgical History Procedure Laterality Date  . Cataract extraction  06/25/06  . Glaucoma surgery  02/12/06  . Foot arthroplasty      left foot moved and stretched tendons   . Hip arthroplasty      right never bipopsy  . Portacath placement  2002-2004  . Removal of vascular port    . Sciatic nerve muscle biopsy     Family History family history includes Cancer in her maternal grandfather and paternal grandmother; Coronary  artery disease in her paternal grandfather; Diabetes in her sister; Other in her sister; Prostate cancer in her maternal grandfather; Thyroid disease in her mother. Family history is negative for migraines, seizures, intellectual disabilities, blindness, deafness, birth defects, chromosomal disorder, or autism.  Social History . Marital Status: Single    Spouse Name: N/A  . Number of Children: N/A  . Years of Education: N/A   Social History Main Topics  . Smoking status: Never Smoker   . Smokeless tobacco: Never Used     Comment: passive smoke exposure  . Alcohol Use: No  . Drug Use: No  . Sexual Activity: No   Social History Narrative    Jessica Kelley is a 25 yo woman who attends [employer].    She is doing excellent.     She is employed at [employer]    She lives with her parents and has one sister, 10 yo.    She enjoys working, camping, and walking outside.   Allergies Allergen Reactions  . Gluten Meal   . Wheat Bran    Physical Exam BP 120/90 mmHg  Pulse 92  Ht 5' 3.5" (1.613 m)  Wt 118 lb 3.2 oz (53.615 kg)  BMI 20.61 kg/m2  LMP 08/11/2015 (Approximate)  General: alert, well developed, well nourished, in no acute distress, blond hair, blue eyes, left-handed  Head: normocephalic, no dysmorphic features  Ears, Nose and Throat: Otoscopic: tympanic membranes normal . Pharynx: oropharynx is pink without exudates or tonsillar hypertrophy.  Neck: supple, full range of motion, no cranial or cervical bruits  Respiratory: auscultation clear  Cardiovascular: no murmurs, pulses are normal  Musculoskeletal: the patient has some atrophy of her right leg, distal atrophy in her calves, and convex right scoliosis that is mild  Skin: no rashes or neurocutaneous lesions  Neurologic Exam  Mental Status: alert; oriented to person, place, and year; knowledge is normal for age; language is normal  Cranial Nerves: visual fields are full to double simultaneous stimuli;  extraocular movements are full and conjugate; pupils are round reactive to light with a left afferent pupillary defect, left iridectomy with lens implant; funduscopic examination shows sharp disc margins with normal vessels on the right and dilated vessels 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  Motor: normal strength in her neck except extensors 4+. Deltoid 4 right, 4- left, biceps 4+ right, 4 left, triceps 5- right, 4+ left, wrist extensors 5 right, 5 left, wrist flexors 5 bilaterally, grip 5 right 5 left, finger extensors 4-4+ bilaterally,Supraspinatous 4 right, 4- left, pronator 4+ bilaterally, supinator 4 bilaterally, pectoralis 4+ left, 5 right; hip flexors 1 right, 3 left , hip adductors 0 right, 3 left, hip abductors 0 right, 3 left; knee  flexors 1 right, 4- left; knee extensors 0 right, 4- left, foot dorsiflexors and plantar flexors 0-1 right, 4- left, toes 0 right, 3 left.  Sensory: No significant peripheral polyneuropathy to cold or vibration. Proprioception appears intact The patient had normal stereognosis. Coordination: good finger-to-nose, rapid repetitive alternating movements and finger apposition  Gait and Station: diplegia gait with waddling, steppage right greater than left, external rotation of her legs, broad-based, arms extended out from her side for balance; unable to walk on her heels or toes, perform tandem Gower response was not attempted, Able to walk short distances without support. Reflexes: symmetric and absent bilaterally; no clonus; bilateral flexor plantar responses  Assessment 1. Chronic inflammatory demyelinating polyneuropathy, G61.81. 2. Abnormality of gait, R26.9. 3. 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  Discussion The patient has a host of other medical problems that are discussed in the past medical history.  These are  basically her neurologic issues.  Plan I have no problem writing a letter on her behalf.  Strongly advocating for the presence of her service dog in the workplace.  This is a Research officer, trade [employer] of safety.  I do not think the dog will create any difficulties.  The dog does not pose risk to anyone except those were allergic to her.  I will try to answer the letter point by point as best as I can.  We will then send it to the appropriate authorities.  She will return to see me in one-year.  I spent 30 minutes of face-to-face time with the patient and her mother.  I will refill orders for her immune therapy and any other medications that I prescribed.   Medication List   This list is accurate as of: 08/18/15 11:59 PM.       gabapentin 100 MG capsule  Commonly known as:  NEURONTIN  TAKE 2 CAPSULES BY MOUTH EVERY DAY AT 8 AM, THEN TAKE 2 CAPSULES AT 4 PM, THEN TAKE 3 CAPSULES AT 11PM     glucagon 1 MG injection  Follow package directions for low blood sugar.     Immune Globulin (Human) 5 GM/50ML Soln  Inject into the vein.     lisinopril 2.5 MG tablet  Commonly known as:  PRINIVIL,ZESTRIL  Take 1 tablet (2.5 mg total) by mouth daily.     methimazole 5 MG tablet  Commonly known as:  TAPAZOLE  Take two tablets (10 mg) methimazole in the morning and 1 tablet (5 mg) in the evening by mouth.     methylPREDNISolone sodium succinate 500 MG injection  Commonly known as:  SOLU-MEDROL  Give 522m IV in 50 ml NS over 1 hour x 1 every 4 weeks. Given by [facility]     mycophenolate 500 MG tablet  Commonly known as:  CELLCEPT  TAKE 2 TABLETS (1000MG) BY MOUTH IN THE MORNING AND IN THE EVENING     NOVOLOG 100 UNIT/ML injection  Generic drug:  insulin aspart  USE 300 UNITS VIA INSULIN PUMP EVERY 48 HOURS     propranolol 10 MG tablet  Commonly known as:  INDERAL  TAKE TWO TABLETS BY MOUTH THREE TIMES DAILY     VITAMIN B-12 PO  Take by mouth.     Vitamin D 1000 units capsule  Take 1,000 Units  by mouth daily.       The medication list was reviewed and reconciled. All changes or newly prescribed medications were explained.  A complete medication list was provided to the patient/caregiver.  WJodi GeraldsMD

## 2015-08-24 ENCOUNTER — Encounter: Payer: Self-pay | Admitting: Family

## 2015-09-07 ENCOUNTER — Ambulatory Visit: Payer: Self-pay | Admitting: "Endocrinology

## 2015-09-19 ENCOUNTER — Other Ambulatory Visit: Payer: Self-pay | Admitting: "Endocrinology

## 2015-09-27 ENCOUNTER — Other Ambulatory Visit: Payer: Self-pay | Admitting: "Endocrinology

## 2015-10-17 ENCOUNTER — Other Ambulatory Visit: Payer: Self-pay | Admitting: Family

## 2015-10-18 ENCOUNTER — Ambulatory Visit (INDEPENDENT_AMBULATORY_CARE_PROVIDER_SITE_OTHER): Payer: BLUE CROSS/BLUE SHIELD | Admitting: "Endocrinology

## 2015-10-18 ENCOUNTER — Encounter: Payer: Self-pay | Admitting: "Endocrinology

## 2015-10-18 VITALS — BP 124/87 | HR 92 | Wt 117.0 lb

## 2015-10-18 DIAGNOSIS — E1043 Type 1 diabetes mellitus with diabetic autonomic (poly)neuropathy: Secondary | ICD-10-CM | POA: Diagnosis not present

## 2015-10-18 DIAGNOSIS — R5383 Other fatigue: Secondary | ICD-10-CM

## 2015-10-18 DIAGNOSIS — E0500 Thyrotoxicosis with diffuse goiter without thyrotoxic crisis or storm: Secondary | ICD-10-CM | POA: Diagnosis not present

## 2015-10-18 DIAGNOSIS — R000 Tachycardia, unspecified: Secondary | ICD-10-CM

## 2015-10-18 DIAGNOSIS — I10 Essential (primary) hypertension: Secondary | ICD-10-CM

## 2015-10-18 DIAGNOSIS — I4711 Inappropriate sinus tachycardia, so stated: Secondary | ICD-10-CM

## 2015-10-18 DIAGNOSIS — E063 Autoimmune thyroiditis: Secondary | ICD-10-CM

## 2015-10-18 DIAGNOSIS — G6181 Chronic inflammatory demyelinating polyneuritis: Secondary | ICD-10-CM

## 2015-10-18 DIAGNOSIS — E10649 Type 1 diabetes mellitus with hypoglycemia without coma: Secondary | ICD-10-CM

## 2015-10-18 DIAGNOSIS — K900 Celiac disease: Secondary | ICD-10-CM

## 2015-10-18 LAB — GLUCOSE, POCT (MANUAL RESULT ENTRY): POC GLUCOSE: 209 mg/dL — AB (ref 70–99)

## 2015-10-18 LAB — POCT GLYCOSYLATED HEMOGLOBIN (HGB A1C): Hemoglobin A1C: 10.8

## 2015-10-18 NOTE — Progress Notes (Signed)
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  HISTORY OF PRESENT ILLNESS: The patient is a 25 year-old Caucasian young woman. The patient was accompanied by her service dog.  1. This very complicated 40 year-old Caucasian young lady was first referred to me on 09/25/06 by her primary care provider, Dr. Garnet Koyanagi, for evaluation and management of type I diabetes mellitus, hypoglycemia, and a myriad of other autoimmune issues.   A. CIDP: At about age 89 the child began to limp.  At age 12 she was evaluated at [facility] and diagnosed with CIDP. At the time I first met her she was under the care of Dr. Marcene Brawn of [facility] in [place]. She was on a regular regimen of intravenous treatments at home with IVIG and steroids on the days of therapy. The IVIG treatments were given monthly. She was also on cyclosporine A daily, alternating doses every other day. Because of the CIDP she had  significant problems with muscle atrophy and weakness, as well as multiple orthopedic issues. She was also being followed by Dr. Simonne Come, an orthopedist in [place]. On that first visit her legs and arms were quite weak. She found it very hard to walk or to exercise.  B. Type 1 diabetes mellitus: Her diabetes mellitus was also diagnosed at age 38, just prior to the diagnosis of CIDP. She had been started on a Medtronic Paradigm 712 insulin pump subsequently and was using Humalog lispro insulin in her pump. She had a very difficult time with blood glucose control. At the time of her steroids, her sugars  tended to run very high. However, when she was able to perform some physical activities, she tended to develop hypoglycemia. Her hemoglobin A1c on that first visit was 9.5%.  C. Celiac disease: The patient was diagnosed with celiac disease at approximately age 4. She had been on a gluten-free diet ever since.  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 She was followed both at [facility] and [facility].  2. Since that initial visit with me, I have followed her for the problems listed above plus other new problems that have developed over time.   A. CIDP: Her CIDP course has waxed and waned. At one point she had improved significantly, her medication doses had been reduced, she felt very much stronger, had more stamina, and was doing very well. Unfortunately, she later had a significant relapse of CIDP and lost much of the improvement that she had gained. Since going back to [facility] in the Spring of 2013, however, and starting a new and more intensified IVIG regimen, her strength had improved. She was still very weak physically and muscularly, however, and needed support to walk. As noted below, after tapering her steroids recently, her weakness had worsened.  B. Type 1 diabetes mellitus: The patient's blood sugars have varied significantly with the course of her CIDP and the treatments for that disease. Her hemoglobin A1c values have ranged from a low of 8.3% to a high of 14.0%. Most of her A1c values have been in the 8.4-9.6% range.   C. Celiac disease: The patient  has really done quite well over the years. She rarely has had signs or symptoms of abdominal problems. Because her antibody levels fluctuated a great deal, her celiac disease antibodies were negative in March 2012. She was mistakenly told that she did not have celiac disease and that she could  resume a normal diet. As expected, resumption of a normal diet caused a recurrence of active celiac disease. In October 2012 she saw a gastroenterologist, Dr. Gale Journey, at [facility] who told her that she did have celiac disease. She is now back on her gluten-free diet and is asymptomatic again.  D. Autoimmune thyroid disease: The patient had a goiter and a TPO antibody level of 287.2 on her first visit in May of 2008, consistent with evolving Hashimoto's Disease. She occasionally had the sensation of swelling and discomfort in her anterior neck, which was also c/w flare-ups of Hashimoto's Disease. In early 2009 she developed increased tachycardia. Lab tests performed in March 2010 by her primary care provider showed a TSH of 0.01 and a T4 of 7.7. It was initially felt that the increase in tachycardia was likely due to a combination of autonomic neuropathy and Hashitoxicosis.  However on follow-up laboratory tests performed in July, her TSH was 0.05, free T4 3.9, and free T3 14.2. Her TSI level done on the assay used at the time was 2.2, with normal being 1.0 or less. It was then evident that she had developed Graves' Disease in the setting of pre-existing Hashimoto's Disease.  I initiated treatment with methimazole, 10 mg twice daily and propranolol 20 mg 3 times daily. Since then we've seen significant swings of her thyroid function tests, partly due to changes in methimazole doses, but also partly due to changes in her immunosuppressant therapies. During the last 2 years her TSH values have ranged from a low of 0.1432 to a high of 14.933. The dose of methimazole has been adjusted as needed to treat her Graves' disease. It has been my hope that her Hashimoto's disease will eventually cancel out her Graves' disease.   3.The patient's last [facility] visit was on 05/09/15. In the interim she has been healthy. Her energy level is "pretty good". She remains on her regimen of IVIG and steroids. She remains on methimazole, 10 mg  each morning and 5 mg each evening. She resumed taking lisinopril. She is still taking propranolol, gabapentin, iron, vitamin D, and a vitamin B complex pill. She has a good appetite. Although weak, she has been getting out a fair amount. She now walks a lot at work, but has not been walking for exercise. 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 She uses a walker to help her walk from the parking lot to her job. She has not had many headaches. Dr. Gaynell Face is the local neurologist caring for her CIDP. She has a new service dog, a [place] shepherd female, who is almost three years old.    4. Pertinent Review of Systems: Constitutional: The patient says she feels "good". She still feels tired often. Her strength and weakness are better. Her stamina is better.  Eyes: Her contact lenses are helping. Last eye exam was in April 2017. There were no signs of diabetic eye disease.    Neck: The patient has had no problems with anterior neck swelling and tenderness for a year or more.   Heart: The patient has not had any sensation of fast heart rate recently. The patient has no other complaints  of palpitations, irregular heart beats, chest pain, or chest pressure.   Gastrointestinal:  Bowel movents seem normal. The patient has no complaints of postprandial bloating, excessive hunger, acid reflux, upset stomach, stomach aches or pains, diarrhea, or constipation.  Legs: Muscle mass and strength seem to be improved. There are few complaints of numbness, tingling, burning, or pain. No edema is noted.  Feet: There are no obvious new foot problems. There are no complaints of numbness, tingling, burning, or pain. No edema is noted. Neurologic: She requires less assistance with walking than she did at her last visit. Sensation seems to be normal. Coordination is better since resuming IVIG. GYN: Her LMP was this week. Menses have been regular.   Hypoglycemia: She has had a few low BGs.   5.  Blood glucose printout: She changes her pump site every 3-5 days. She has been using her buttocks for all site changes. She is checking BGs more frequently, but according to her pump she sometimes goes for up to 2 days without entering BG data. She say that she checks her BGs more frequently, but often does not input the numbers. She boluses from 2-10 times daily, averaging about 4 times daily. She is missing many correction boluses, sometimes for up to 2 days at a time. Her average BG is 204, compared with 232 at her last visit. 232.  When her sites are working well and she checks BGs more frequently and takes boluses accordingly, her BGs can  be between 74-244. Her highest BGs occur late on Mondays, the days of her IVIG-Steroid treatments and on the following Tuesdays. Her BGs after the IVIG treatments of Thursdays are variable. BGs also vary with the duration of the sites. In general, the longer the sites stay in, the worse the BGs will be. She has had 3 BGs <80; a 67,a 77, and a 78. Although I have asked her repeatedly to use higher temporary basal rates on the Mondays that she takes both IVIG and steroids and on the following Tuesdays as well, she just won't use the TBRs.   PAST MEDICAL, FAMILY, AND SOCIAL HISTORY: 1. School: She resumed taking college courses on line last Fall and will do so again this coming Fall. She is working part-time now, but up to 40 hours per week.       2. Activities: She has been walking more for exercise and more at work. She will have a follow up appointment at [facility] every 2 years.  3. Smoking, alcohol, or drugs: None 4. Primary Care Provider: Dr. Garnet Koyanagi at [facility]. 5. Neurologist: Dr. Wyline Copas  REVIEW OF SYSTEMS: There are no other significant problems involving the patient's other body systems.  PHYSICAL EXAM: BP 124/87 mmHg  Pulse 92  Wt 117 lb (53.071 kg)   Constitutional: This patient appears reasonably healthy, but seems tired. Her affect  is still somewhat flat, but she brightened up when talking about her dog and the dog's training. She is now able to stand up from a sitting position and to walk down the hall with the support of her walker, but without the support of another person. Her gait is very much affected by the fact that her right leg is much weaker than her left. Her weight has increased by 6.4 ounces. Head: The head is normocephalic. Face: The face appears normal. There are no obvious dysmorphic features. Eyes: The eyes appear to be normally formed and spaced. Gaze is conjugate. There is no obvious arcus  or proptosis. Moisture appears normal. Mouth: The oropharynx and tongue appear normal. Dentition appears to be normal for age. Oral moisture is normal. Neck: On inspection the thyroid gland appears to be normal in size. No carotid bruits are noted. The thyroid gland is a bit larger at 23-24 grams in size, but the lobes and isthmus are still symmetrically enlarged. The thyroid gland is not tender to palpation. Lungs: The lungs are clear to auscultation. Air movement is good. Heart: Heart rate and rhythm are regular. Heart sounds S1 and S2 are normal. I did not appreciate any pathologic cardiac murmurs. Abdomen: The abdomen is normal in size for the patient's age. Bowel sounds are normal. There is no obvious hepatomegaly, splenomegaly, or other mass effect.  Arms: Muscle size and bulk are low-normal for age. Hands: There is no tremor today. Phalangeal and metacarpophalangeal joints are normal. Palmar muscles are low-normal for age. Palmar skin is normal. Palmar moisture is normal.. Legs: Muscles appear low-normal for age. No edema is present. Feet: Dorsalis pedal pulses are 1+ bilaterally.    Neurologic: Strength is 4-5/5 for age in the upper extremities and 3/5 in the right leg and 4/5 in the left leg. Muscle tone is somewhat low. Sensation to touch is normal in both her legs and feet.    LAB DATA:  Labs 10/18/15: HbA1c  10.8%  Labs 05/09/15: HbA1c 11.1%  Labs 04/07/15: CBC normal; TSH 1.826, free T4 1.06, free T3 2.8, TSI 156; iron 234, CMP normal except for sodium 132, glucose 159, albumin 2.9, calcium 8.7; ACTH 11, cortisol 10.5  Labs 11/08/14: HbA1c 10.9%; TSH 2.234, free T4 0.87, free T3 2.7, TSI pending  Labs 06/01/14: HbA1c is 9.7% today, compared with 11.5% at last visit. Other labs are pending.  Labs 02/17/14: TSH 2.125, free T4 0.91, free T3 3.0  Labs 07/09/13; HbA1c was 11.4%, compared with 9.1% at last visit and with 14.0% at the visit prior. TSH was 0.652, free T4 0.88, free T3 3.3  Labs 10/06/12: TSH 0.088, free T4 1.28, free T3 3.1  Labs 02/25/12: TSH 1.044, free T4 1.45, free T3 3.4  Labs 06/14/11: TSH was 1.633. Free T4 1.47. Free T3 was 3.1. TSI was 56 (normal less than 120).                       ASSESSMENT: 1. Type 1 diabetes mellitus: Her BG control is better in terms of her A1c, her average BG, the number of BG checks she does, and the number of boluses that she gives. Unfortunately, she often misses BG checks and correction boluses when she needs them the most, either when she has just had IVIG and steroids or when her sites are staying in too long.  2.  Autonomic neuropathy with tachycardia: The heart rate is lower, paralleling her lower A1c at last visit. Her autonomic neuropathy and tachycardia will reverse when her BGs come under even better control.  3. Hypoglycemia: She had three documented cases in the past month. None were severe.   4-8 Autoimmune thyroid disease, Graves' disease, Hashimoto's disease, and goiter:   A. The patient was euthyroid in November 2016 on her MTZ doses of 10 mg in the mornings and 5 mg in the evenings. She appears clinically hypothyroid today.  Within her thyroid gland there is an on-going battle between her Graves Dz B lymphocytes and her Hashimoto's Dz T lymphocytes.   B. Thyrotoxicosis: Her diffuse thyrotoxicosis, secondary to Graves' disease, has waxed  and waned over time.  Her thyrotoxicosis was well controlled by her current methimazole doses at her last visit.  C. Hashimoto's thyroiditis: The patient's thyroiditis is clinically quiescent at this time. However, the pattern in which all 3 of her TFTs decreased in parallel together from 2013 to 2014 indicated that she had had a relatively recent flare up of Hashimoto's Dz. I expect that as her Hashimoto's disease progressively destroys more thyroid cells, we will be able to taper her methimazole to zero. She will probably become permanently hypothyroid over time and will require Synthroid hormone replacement.  D.Goiter: Thyroid is a bit larger. The waxing and waning of thyroid lobe size is c/w evolving Hashimoto's disease.  9. Celiac disease: Patient is pretty much back on her gluten-free diet. She is slowly gaining weight and is asymptomatic. 10.  Chronic inflammatory demyelinating polyneuropathy (CIDP) and muscle weakness: She feels better subjectively. We'll see how she does over time on the regimen of only one IVIG-glucocorticoid treatment per week.     11-12. Fatigue and decreased stamina: These problems are better. Since she has been on a regimen of one iv glucocorticoid treatment for many months, it was possible that her adrenals were suppressed. However, her recent ACTH and cortisol levels were normal.  13. Emotionality: She feels that she is doing better. I continue to be impressed and inspired by Nikky's dignity and courage as she tried to do all that she can to improve her own health. 15. Dyspepsia: She is doing well, despite discontinuing ranitidine. We'll see how she does over time.  15. Hypertension: Her DBP is still high despite starting low-dose lisinopril. She needs more lisinopril.  PLAN: 1. Diagnostic: HbA1c today. I ordered TFTs, TSI, CBC, and CMP. 2. Therapeutic:   A. Continue current pump settings. Change sites every 3 days, especially on days of IVIG therapy.   B. On the  day of IVIG and steroids, check BGs frequently and bolus frequently. Use a temporary basal rate of 120% for 4-8 hours, repeating every 4-8 hours for up to 48 hours as needed.  C. Increase lisinopril to 2.5 mg, twice daily.   D. Check BGs, input data, and take correction boluses. Call Dr. Tobe Sos when you have about two weeks of good BG data.  3. Patient education: We discussed the issue of using an increased temporary basal rate for 48 hours after taking steroids. She needs to be as consistent as possible with pump site changes, BG checks, and insulin boluses.  The more consistent she can be, the fewer variables she will have to deal with.  4. Follow-up: 2 months in a New Patient slot or last appointment of the day slot so that we will have more time to address her issues.  Level of Service: This visit lasted in excess of 60 minutes. More than 50% of the visit was devoted to counseling.  Sherrlyn Hock, MD, CDE Adult and Pediatric Endocrinology

## 2015-10-18 NOTE — Patient Instructions (Signed)
Follow up visit in two months in either a NP slot or the last appointment slot of the day.

## 2015-11-13 ENCOUNTER — Other Ambulatory Visit: Payer: Self-pay | Admitting: "Endocrinology

## 2015-12-19 ENCOUNTER — Ambulatory Visit: Payer: BLUE CROSS/BLUE SHIELD | Admitting: "Endocrinology

## 2015-12-20 ENCOUNTER — Other Ambulatory Visit: Payer: Self-pay | Admitting: "Endocrinology

## 2016-01-23 ENCOUNTER — Ambulatory Visit: Payer: BLUE CROSS/BLUE SHIELD | Admitting: "Endocrinology

## 2016-01-25 ENCOUNTER — Other Ambulatory Visit: Payer: Self-pay | Admitting: "Endocrinology

## 2016-02-22 ENCOUNTER — Ambulatory Visit (INDEPENDENT_AMBULATORY_CARE_PROVIDER_SITE_OTHER): Payer: BLUE CROSS/BLUE SHIELD | Admitting: "Endocrinology

## 2016-02-22 ENCOUNTER — Encounter (INDEPENDENT_AMBULATORY_CARE_PROVIDER_SITE_OTHER): Payer: Self-pay | Admitting: *Deleted

## 2016-02-22 VITALS — BP 108/71 | HR 108 | Wt 122.0 lb

## 2016-02-22 DIAGNOSIS — E1043 Type 1 diabetes mellitus with diabetic autonomic (poly)neuropathy: Secondary | ICD-10-CM | POA: Diagnosis not present

## 2016-02-22 DIAGNOSIS — K900 Celiac disease: Secondary | ICD-10-CM

## 2016-02-22 DIAGNOSIS — I10 Essential (primary) hypertension: Secondary | ICD-10-CM

## 2016-02-22 DIAGNOSIS — R000 Tachycardia, unspecified: Secondary | ICD-10-CM

## 2016-02-22 DIAGNOSIS — E049 Nontoxic goiter, unspecified: Secondary | ICD-10-CM

## 2016-02-22 DIAGNOSIS — E1065 Type 1 diabetes mellitus with hyperglycemia: Secondary | ICD-10-CM | POA: Diagnosis not present

## 2016-02-22 DIAGNOSIS — R5383 Other fatigue: Secondary | ICD-10-CM

## 2016-02-22 DIAGNOSIS — G6181 Chronic inflammatory demyelinating polyneuritis: Secondary | ICD-10-CM | POA: Diagnosis not present

## 2016-02-22 DIAGNOSIS — E0500 Thyrotoxicosis with diffuse goiter without thyrotoxic crisis or storm: Secondary | ICD-10-CM

## 2016-02-22 DIAGNOSIS — E10649 Type 1 diabetes mellitus with hypoglycemia without coma: Secondary | ICD-10-CM

## 2016-02-22 DIAGNOSIS — IMO0001 Reserved for inherently not codable concepts without codable children: Secondary | ICD-10-CM

## 2016-02-22 DIAGNOSIS — E063 Autoimmune thyroiditis: Secondary | ICD-10-CM

## 2016-02-22 LAB — CBC WITH DIFFERENTIAL/PLATELET
BASOS PCT: 1 %
Basophils Absolute: 42 cells/uL (ref 0–200)
EOS ABS: 42 {cells}/uL (ref 15–500)
Eosinophils Relative: 1 %
HEMATOCRIT: 43.2 % (ref 35.0–45.0)
HEMOGLOBIN: 13.9 g/dL (ref 11.7–15.5)
Lymphocytes Relative: 27 %
Lymphs Abs: 1134 cells/uL (ref 850–3900)
MCH: 29.7 pg (ref 27.0–33.0)
MCHC: 32.2 g/dL (ref 32.0–36.0)
MCV: 92.3 fL (ref 80.0–100.0)
MPV: 11.6 fL (ref 7.5–12.5)
Monocytes Absolute: 420 cells/uL (ref 200–950)
Monocytes Relative: 10 %
NEUTROS ABS: 2562 {cells}/uL (ref 1500–7800)
Neutrophils Relative %: 61 %
PLATELETS: 226 10*3/uL (ref 140–400)
RBC: 4.68 MIL/uL (ref 3.80–5.10)
RDW: 14.1 % (ref 11.0–15.0)
WBC: 4.2 10*3/uL (ref 3.8–10.8)

## 2016-02-22 LAB — TSH: TSH: 1.42 mIU/L

## 2016-02-22 LAB — GLUCOSE, POCT (MANUAL RESULT ENTRY): POC GLUCOSE: 247 mg/dL — AB (ref 70–99)

## 2016-02-22 LAB — T3, FREE: T3, Free: 3.4 pg/mL (ref 2.3–4.2)

## 2016-02-22 LAB — POCT GLYCOSYLATED HEMOGLOBIN (HGB A1C): HEMOGLOBIN A1C: 10.7

## 2016-02-22 LAB — T4, FREE: Free T4: 1.1 ng/dL (ref 0.8–1.8)

## 2016-02-22 NOTE — Progress Notes (Signed)
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  HISTORY OF PRESENT ILLNESS: The patient is a 25 year-old Caucasian young woman. The patient was unaccompanied.  1. This very complicated 25 year-old Caucasian young woman was first referred to me on 09/25/06 by her primary care provider, Dr. Garnet Koyanagi, for evaluation and management of type I diabetes mellitus, hypoglycemia, and a myriad of other autoimmune issues.   A. CIDP: At about age 89 the child began to limp.  At age 77 she was evaluated at [facility] and diagnosed with CIDP. At the time I first met her she was under the care of Dr. Marcene Brawn of [facility] in [place]. She was on a regular regimen of intravenous treatments at home with IVIG and steroids on the days of therapy. The IVIG treatments were given monthly. She was also on cyclosporine A daily, alternating doses every other day. Because of the CIDP she had  significant problems with muscle atrophy and weakness, as well as multiple orthopedic issues. She was also being followed by Dr. Simonne Come, an orthopedist in [place]. On that first visit her legs and arms were quite weak. She found it very hard to walk or to exercise.  B. Type 1 diabetes mellitus: Her diabetes mellitus was also diagnosed at age 56, just prior to the diagnosis of CIDP. She had been started on a Medtronic Paradigm 712 insulin pump subsequently and was using Humalog lispro insulin in her pump. She had a very difficult time with blood glucose control. At the time of her steroids, her sugars tended to run very  high. However, when she was able to perform some physical activities, she tended to develop hypoglycemia. Her hemoglobin A1c on that first visit was 9.5%.  C. Celiac disease: The patient was diagnosed with celiac disease at approximately age 68. She had been on a gluten-free diet ever since.  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 She was followed both at [facility] and [facility] eye clinic.  2. Since that initial visit with me, I have followed her for the problems listed above plus other new problems that have developed over time.   A. CIDP: Her CIDP course has waxed and waned. At one point she had improved significantly, her medication doses had been reduced, she felt very much stronger, had more stamina, and was doing very well. Unfortunately, she later had a significant relapse of CIDP and lost much of the improvement that she had gained. Since going back to [facility] in the Spring of 2013, however, and starting a new and more intensified IVIG regimen, her strength had improved. She was still very weak physically and muscularly, however, and needed support to walk. As noted below, after tapering her steroids recently, her weakness had worsened. In the past two years Dr. Wyline Copas, MD, our senior pediatric neurologist, has assumed the role of managing her CIDP.  B. Type 1 diabetes mellitus: The patient's blood sugars have varied significantly with the course of her CIDP and the treatments for that disease. Her hemoglobin A1c values have ranged from a low of 8.3% to a high of 14.0%. Most  of her A1c values have been in the 8.4-9.6% range.   C. Celiac disease: The patient has really done quite well over the years. She rarely has had signs or symptoms of abdominal problems. Because her antibody levels fluctuated a great deal, her celiac disease antibodies were  negative in March 2012. She was mistakenly told that she did not have celiac disease and that she could resume a normal diet. As expected, resumption of a normal diet caused a recurrence of active celiac disease. In October 2012 she saw a gastroenterologist, Dr. Gale Journey, at [facility] who told her that she did have celiac disease. She is now back on her gluten-free diet and is asymptomatic again.  D. Autoimmune thyroid disease: The patient had a goiter and a TPO antibody level of 287.2 on her first visit in May of 2008, consistent with evolving Hashimoto's Disease. She occasionally had the sensation of swelling and discomfort in her anterior neck, which was also c/w flare-ups of Hashimoto's Disease. In early 2009 she developed increased tachycardia. Lab tests performed in March 2010 by her primary care provider showed a TSH of 0.01 and a T4 of 7.7. It was initially felt that the increase in tachycardia was likely due to a combination of autonomic neuropathy and Hashitoxicosis.  However on follow-up laboratory tests performed in July, her TSH was 0.05, free T4 3.9, and free T3 14.2. Her TSI level done on the assay used at the time was 2.2, with normal being 1.0 or less. It was then evident that she had developed Graves' Disease in the setting of pre-existing Hashimoto's Disease.  I initiated treatment with methimazole, 10 mg twice daily and propranolol 20 mg 3 times daily. Since then we've seen significant swings of her thyroid function tests, partly due to changes in methimazole doses, but also partly due to changes in her immunosuppressant therapies. During the last 2 years her TSH values have ranged from a low of 0.1432 to a high of 14.933. The dose of methimazole has been adjusted as needed to treat her Graves' disease. It has been my hope that her Hashimoto's disease will eventually cancel out her Graves' disease.   3.The patient's last [facility] visit was on 09/30/15. In the interim she has been healthy.   A. Her energy  level is "good". Her CDIP is about the same. She has a good appetite. Although weak, she has been getting out a fair amount. She now walks a lot at work, but has not been walking for exercise. 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 She uses a walker to help her walk from the parking lot to her job. She has not had many headaches. Dr. Gaynell Face continues to follow her for her CIDP management. Her female [place] shepherd service dog is working out well.     B. She remains on her regimen of IVIG and steroids. She remains on methimazole, 10 mg each morning and 5 mg each evening. She resumed taking lisinopril, 2.5 mg, twice daily. She is still taking propranolol, gabapentin, iron, vitamin D, and a vitamin B complex pill.   4. Pertinent Review of Systems: Constitutional: The patient says she feels "pretty good". She still feels tired often. She has also felt mentally hyper and has trouble falling asleep recently, but sleeps well when she does fall asleep. Her strength and stamina are about the same, but vary significantly from day to day.   Eyes: Her contact lenses are helping. Last eye exam  was in April 2017. There were no signs of diabetic eye disease.    Neck: The patient has had no problems with anterior neck swelling and tenderness for a year or more.   Heart: The patient has not had any sensation of fast heart rate recently. The patient has no other complaints of palpitations, irregular heart beats, chest pain, or chest pressure.   Gastrointestinal:  She has had several episodes of stomach aches and vomiting after eating beef and sausage, but not chicken or fish. Bowel movents seem normal. The patient has no complaints of postprandial bloating, excessive hunger, acid reflux, upset stomach, stomach aches or pains, diarrhea, or constipation.  Legs: Muscle mass and strength seem to be improved a bit. There are few complaints of numbness, tingling, burning, or pain. No edema is  noted.  Feet: There are no obvious new foot problems. There are no complaints of numbness, tingling, burning, or pain. No edema is noted. Neurologic: She still requires the use of her walker or the assistance of another person when she walks. Sensation seems to be normal. Coordination is better since resuming IVIG. GYN: Her LMP was about 2 weeks ago. Menses have been regular.   Hypoglycemia: She has had a few low BGs.   5. Blood glucose printout: She changes her pump site every 2-4 days. She has been using her buttocks for all site changes. She is checking BGs 1-5 times per day, but mostly only 1-2 times. She say that she checks her BGs more frequently, but often does not input the numbers. She boluses from 2-7 times daily, averaging about 4 times daily. She is missing many correction boluses. Her average BG is 203, compared with 204 at her last visit. When her sites are working well and she checks BGs more frequently and takes boluses accordingly, her BGs can  be between 99-186. Her highest BGs occur late on Mondays, the days of her IVIG-steroid treatments and on the following Tuesdays. Her BGs after the IVIG treatments on Thursdays are variable. Although I have repeatedly asked her to use a TBR of 120% on the day of steroids and on the following day, she does not do so. BGs also vary with the duration of the sites. In general, the longer the sites stay in, the worse the BGs will be. She has had 2 BGs <80.   PAST MEDICAL, FAMILY, AND SOCIAL HISTORY: 1. School: She continues to take college courses on line. She is working part-time now, but up to 40 hours per week.       2. Activities: She has been walking more for exercise and more at work. She no longer has follow up appointments at [facility] every 2 years.  3. Smoking, alcohol, or drugs: None 4. Primary Care Provider: Dr. Garnet K[facility]anagi at [facility]. 5. Neurologist: Dr. Wyline Copas  REVIEW OF SYSTEMS: There are no other significant problems  involving the patient's other body systems.  PHYSICAL EXAM: BP 108/71   Pulse (!) 108   Wt 122 lb (55.3 kg)   BMI 21.27 kg/m    Constitutional: This patient appears reasonably healthy, but looks a bit tired. Her affect is pretty normal today. She is now able to stand up from a sitting position and to walk down the hall with the support of another person. Her gait is very much affected by the fact that her right leg is much weaker than her left. Her weight has increased by 5 pounds.  Head: The head is normocephalic.  Face: The face appears normal. There are no obvious dysmorphic features. Eyes: The eyes appear to be normally formed and spaced. Gaze is conjugate. There is no obvious arcus or proptosis. Moisture appears normal. Mouth: The oropharynx and tongue appear normal. Dentition appears to be normal for age. Oral moisture is normal. Neck: On inspection the thyroid gland appears to be normal in size. No carotid bruits are noted. The thyroid gland is larger at 24-25 grams in size. The lobes and isthmus are still symmetrically enlarged. The thyroid gland is firm, but not tender to palpation. Lungs: The lungs are clear to auscultation. Air movement is good. Heart: Heart rate and rhythm are regular. Heart sounds S1 and S2 are normal. I did not appreciate any pathologic cardiac murmurs. Abdomen: The abdomen is normal in size for the patient's age. Bowel sounds are normal. There is no obvious hepatomegaly, splenomegaly, or other mass effect.  Arms: Muscle size and bulk are low-normal for age. Hands: There is no tremor today. Phalangeal and metacarpophalangeal joints are normal. Palmar muscles are low-normal for age. Palmar skin is normal. Palmar moisture is normal.. Legs: Muscles appear low-normal for age. No edema is present. Feet: Dorsalis pedal pulses are 1+ bilaterally.    Neurologic: Strength is 4-5/5 for age in the upper extremities and 3/5 in the right leg and 4/5 in the left leg. Muscle tone  is somewhat low. Sensation to touch is normal in both her legs and feet.    LAB DATA:  Labs 02/22/16: HbA1c 10.7%  Labs 10/18/15: HbA1c 10.8%  Labs 05/09/15: HbA1c 11.1%  Labs 04/07/15: CBC normal; TSH 1.826, free T4 1.06, free T3 2.8, TSI 156; iron 234, CMP normal except for sodium 132, glucose 159, albumin 2.9, calcium 8.7; ACTH 11, cortisol 10.5  Labs 11/08/14: HbA1c 10.9%; TSH 2.234, free T4 0.87, free T3 2.7, TSI pending  Labs 06/01/14: HbA1c is 9.7% today, compared with 11.5% at last visit. Other labs are pending.  Labs 02/17/14: TSH 2.125, free T4 0.91, free T3 3.0  Labs 07/09/13; HbA1c was 11.4%, compared with 9.1% at last visit and with 14.0% at the visit prior. TSH was 0.652, free T4 0.88, free T3 3.3  Labs 10/06/12: TSH 0.088, free T4 1.28, free T3 3.1  Labs 02/25/12: TSH 1.044, free T4 1.45, free T3 3.4  Labs 06/14/11: TSH was 1.633. Free T4 1.47. Free T3 was 3.1. TSI was 56 (normal less than 120).                       ASSESSMENT: 1. Type 1 diabetes mellitus: Her BG control is a bit better in terms of her A1c and her average BG, but not so well in terms of the number of BG checks she does and the number of boluses that she gives. Unfortunately, she often misses BG checks and correction boluses when she needs them the most, either when she has just had IVIG and steroids or when her sites are staying in too long.  2.  Autonomic neuropathy with tachycardia: The heart rate is higher, c/w her elevated HbA1c and the number of BGs >300 that she has. Her autonomic neuropathy and tachycardia will reverse when her BGs come under even better control.  3. Hypoglycemia: She had two documented cases in the past month. None were severe.   4-8 Autoimmune thyroid disease, Graves' disease, Hashimoto's disease, and goiter:   A. The patient was euthyroid in November 2016 on her MTZ doses of 10 mg in  the mornings and 5 mg in the evenings. She appears clinically euthyroid today.  Within her thyroid  gland there is an on-going battle between her Graves Dz B lymphocytes and her Hashimoto's Dz T lymphocytes.   B. Thyrotoxicosis: Her diffuse thyrotoxicosis, secondary to Graves' disease, has waxed and waned over time.  Her thyrotoxicosis was well controlled by her current methimazole doses at her last visit.  C. Hashimoto's thyroiditis: The patient's thyroiditis is clinically quiescent at this time. However, the pattern in which all 3 of her TFTs decreased in parallel together from 2013 to 2014 indicated that she had had a relatively recent flare up of Hashimoto's Dz. I expect that as her Hashimoto's disease progressively destroys more thyroid cells, we will be able to taper her methimazole to zero. She will probably become permanently hypothyroid over time and will require Synthroid hormone replacement.  D.Goiter: Thyroid is a bit larger. The waxing and waning of thyroid lobe size is c/w evolving Hashimoto's disease.   E. She was supposed to have had her TFTS re-checked in May-June, but did not do so. We will draw her TFTS now.  9. Celiac disease: Patient is pretty much back on her gluten-free diet. She is slowly gaining weight and is asymptomatic. 10.  Chronic inflammatory demyelinating polyneuropathy (CIDP) and muscle weakness: She feels a bit better subjectively. She appears clinically to be about the same as at last visit. We'll see how she does over time.     11-12. Fatigue and decreased stamina: These problems are a bit better. Since she has been on a regimen of one iv glucocorticoid treatment for many months, it was possible that her adrenals were suppressed. However, her last ACTH and cortisol levels were normal.  13. Emotionality: She feels that she is doing better. I continue to be impressed and inspired by Crysten's dignity and courage as she tried to do all that she can to improve her own health. 15-16. Dyspepsia-GERD: She is doing fairly well overall, but I do not know what is causing her  episodic stomach aches and vomiting after eating beef or sausage.  17. Hypertension: Her BP is good today on her twice daily low-dose lisinopril regimen.  PLAN: 1. Diagnostic: HbA1c today. I ordered TFTs, TSI, CBC, and CMP. 2. Therapeutic:   A. New pump settings. Change sites every 3 days, especially on days of IVIG therapy.  1). New basal rates:  MN: 0.950 6 AM: 1.00 11:40 AM: 1.25 5:30 PM: 0.950 -> 1.050  B. On the day of IVIG and steroids, check BGs frequently and bolus frequently. Use a temporary basal rate of 120% for 4-8 hours, repeating every 4-8 hours for up to 48 hours as needed.  C. Continue lisinopril does of 2.5 mg, twice daily. Continue other meds as is for right now.   D. Check BGs, input data, and take correction boluses.  3. Patient education: We discussed the issue of using an increased temporary basal rate for 48 hours after taking steroids. She needs to be as consistent as possible with pump site changes, BG checks, and insulin boluses.  The more consistent she can be, the fewer variables she will have to deal with.  4. Follow-up: 2 months in a New Patient slot or last appointment of the day slot so that we will have more time to address her issues.  Level of Service: This visit lasted in excess of 60 minutes. More than 50% of the visit was devoted to counseling.  Sherrlyn Hock, MD, CDE  Adult and Pediatric Endocrinology

## 2016-02-22 NOTE — Patient Instructions (Signed)
Folow up visit in 2 months.

## 2016-02-23 LAB — COMPREHENSIVE METABOLIC PANEL
ALK PHOS: 52 U/L (ref 33–115)
ALT: 12 U/L (ref 6–29)
AST: 21 U/L (ref 10–30)
Albumin: 3.1 g/dL — ABNORMAL LOW (ref 3.6–5.1)
BILIRUBIN TOTAL: 0.3 mg/dL (ref 0.2–1.2)
BUN: 15 mg/dL (ref 7–25)
CO2: 25 mmol/L (ref 20–31)
Calcium: 8.9 mg/dL (ref 8.6–10.2)
Chloride: 98 mmol/L (ref 98–110)
Creat: 0.58 mg/dL (ref 0.50–1.10)
GLUCOSE: 215 mg/dL — AB (ref 70–99)
Potassium: 4.3 mmol/L (ref 3.5–5.3)
SODIUM: 131 mmol/L — AB (ref 135–146)
Total Protein: 9.1 g/dL — ABNORMAL HIGH (ref 6.1–8.1)

## 2016-02-23 LAB — MICROALBUMIN / CREATININE URINE RATIO
Creatinine, Urine: 101 mg/dL (ref 20–320)
Microalb Creat Ratio: 6 mcg/mg creat (ref <30)
Microalb, Ur: 0.6 mg/dL

## 2016-02-24 ENCOUNTER — Encounter (INDEPENDENT_AMBULATORY_CARE_PROVIDER_SITE_OTHER): Payer: Self-pay | Admitting: "Endocrinology

## 2016-02-26 ENCOUNTER — Other Ambulatory Visit: Payer: Self-pay | Admitting: "Endocrinology

## 2016-02-27 LAB — THYROID STIMULATING IMMUNOGLOBULIN: TSI: <89 % baseline (ref <140)

## 2016-02-29 ENCOUNTER — Encounter (INDEPENDENT_AMBULATORY_CARE_PROVIDER_SITE_OTHER): Payer: Self-pay | Admitting: *Deleted

## 2016-04-08 ENCOUNTER — Telehealth (INDEPENDENT_AMBULATORY_CARE_PROVIDER_SITE_OTHER): Payer: Self-pay

## 2016-04-08 DIAGNOSIS — R269 Unspecified abnormalities of gait and mobility: Secondary | ICD-10-CM

## 2016-04-08 DIAGNOSIS — E1043 Type 1 diabetes mellitus with diabetic autonomic (poly)neuropathy: Secondary | ICD-10-CM

## 2016-04-08 DIAGNOSIS — G6181 Chronic inflammatory demyelinating polyneuritis: Secondary | ICD-10-CM

## 2016-04-08 NOTE — Telephone Encounter (Signed)
Mom called me back and I talked with her about [place] as places to start to look at equipment. Mom said that she would check out those options and get back in touch with me. TG

## 2016-04-08 NOTE — Telephone Encounter (Signed)
Jessica Kelley can you help me with this?

## 2016-04-08 NOTE — Telephone Encounter (Signed)
Patient's mother called stating that Jessica Kelley has a service dog now while she is working. She states that she is very thankful for Dr. Melanee Left help. She also states that they have been purchasing walkers and wheelchairs through [employer] but they continuously fall apart. She would like a call back with some recommendations of where they can purchase better equipment.   CB:[phone]

## 2016-04-08 NOTE — Telephone Encounter (Signed)
I left a message for Mom and asked her to call me back. TG

## 2016-04-15 NOTE — Telephone Encounter (Addendum)
Mom Georgana Curio left a message on 04/10/16 when I was out of the office asking for an order for a folding walker and a lightweight wheelchair to be sent to [facility] to fax # [phone]. I faxed the order today as requested and called Mom to let her know. TG

## 2016-04-15 NOTE — Telephone Encounter (Signed)
Thank you, I appreciate your help.

## 2016-04-15 NOTE — Addendum Note (Signed)
Addended by: Joelyn Oms on: 04/15/2016 10:50 AM   Modules accepted: Orders

## 2016-04-19 ENCOUNTER — Other Ambulatory Visit: Payer: Self-pay | Admitting: Pediatrics

## 2016-04-26 ENCOUNTER — Other Ambulatory Visit: Payer: Self-pay | Admitting: Pediatrics

## 2016-04-26 DIAGNOSIS — G6181 Chronic inflammatory demyelinating polyneuritis: Secondary | ICD-10-CM

## 2016-04-26 NOTE — Telephone Encounter (Signed)
Mom called and said that Jessica Kelley was getting headaches again on the day of IVIG and needed to restart Prednisone 78m as she was taking on the day before, day of and day after the IVIG. I sent in the Rx for her. TG

## 2016-05-03 ENCOUNTER — Other Ambulatory Visit: Payer: Self-pay | Admitting: "Endocrinology

## 2016-05-06 ENCOUNTER — Telehealth (INDEPENDENT_AMBULATORY_CARE_PROVIDER_SITE_OTHER): Payer: Self-pay | Admitting: "Endocrinology

## 2016-05-06 NOTE — Telephone Encounter (Signed)
Please send refill for propranolol to [facility] on [address]

## 2016-05-06 NOTE — Telephone Encounter (Signed)
Script sent this am-

## 2016-05-07 ENCOUNTER — Telehealth (INDEPENDENT_AMBULATORY_CARE_PROVIDER_SITE_OTHER): Payer: Self-pay

## 2016-05-07 ENCOUNTER — Telehealth (INDEPENDENT_AMBULATORY_CARE_PROVIDER_SITE_OTHER): Payer: Self-pay | Admitting: "Endocrinology

## 2016-05-07 NOTE — Telephone Encounter (Signed)
1. Sahirah called. Her pump stopped working today and all of her screens went blank. It will be another 2-4 days before a replacement pump can arrive.  2. I reviewed her insulin pump settings. She was receiving 25.75 units of basal insulin daily. Her ICR was 7 and her ISF was 50. Her target BG was 110. 3. In order to simplify her temporary MDI plan and compensate for the pump being more efficient than MDI, we need to give her 31 units of Lantus, change her ICR to 1 unit for every 5 grams of carbs, but continue her ISF of 50, but with a target of 150. She needs to be on the Small column bedtime snack and have a bedtime sliding scale of 1 unit for every 50 points of BG >250. She also needs to check her BG at 2 AM to ensure that she is not having hypoglycemia.  4. I asked her to call me tomorrow afternoon about 4:30 PM.  5. Mom will stop by the office to pick up two Lantus pens and two Novolog pens. Tillman Sers, MD, CDE Adult and Pediatric Endocrinology

## 2016-05-09 ENCOUNTER — Ambulatory Visit (INDEPENDENT_AMBULATORY_CARE_PROVIDER_SITE_OTHER): Payer: BLUE CROSS/BLUE SHIELD | Admitting: "Endocrinology

## 2016-05-21 ENCOUNTER — Telehealth (INDEPENDENT_AMBULATORY_CARE_PROVIDER_SITE_OTHER): Payer: Self-pay

## 2016-05-21 NOTE — Telephone Encounter (Signed)
  Who's calling (name and relationship to patient) :mom Lauren and Colletta [place]  Best contact number: mom: Cleva Camero [phone]  Provider they see: Tobe Sos  Reason for call:Patient wants to talk about changing off of pump to shots     [place]  Name of prescription:  Pharmacy:

## 2016-05-21 NOTE — Telephone Encounter (Signed)
Routed to provider

## 2016-05-22 NOTE — Telephone Encounter (Signed)
TC to [place] per Dr. Varney Baas to give instruction on insulin shots. Jessica Kelley was on her Medtronic's 530G pump and broke 1/12 weeks ago so she started shots then. She says that her Blood sugars are better now that she is on shots and she is using two component method of 150/50/5 and taking 26 units of Lantus at bedtime, and she said that is working well for her. She tried to take more Lantus but was getting low blood sugars. Advised that provider was suggesting 120/30/10,with 29 units of Lantus at night, but if the 150/50/5 is working well with her and 26 units of Lantus is helping her Bg's then she should continue with that and call Sunday with Blood sugars unless she has low blood sugars then call earlier. Advised will route information to Dr. Tobe Sos.

## 2016-05-23 ENCOUNTER — Telehealth (INDEPENDENT_AMBULATORY_CARE_PROVIDER_SITE_OTHER): Payer: Self-pay

## 2016-05-23 NOTE — Telephone Encounter (Signed)
Jessica Kelley called stating that she would like to talk to Dr. Gaynell Face about some concerns. She states that she has been feeling weak lately and she states that she has also fallen a few times as well. She would like to figure out some options that can be done.   [phone]

## 2016-05-23 NOTE — Telephone Encounter (Signed)
I spoke with the patient for 4 minutes.  She is gradually getting weaker and has had some falls.  I asked her to contact Dr. Lerry Paterson at [facility] and to have him fax any recommendations.  In the past she's had 5 days of pulse steroids which would be a reasonable approach.  We haven't done that since 2012.  She apparently can't really see that any higher dose of IVIG.  She only missed one dose of IV steroids.

## 2016-05-31 ENCOUNTER — Ambulatory Visit (INDEPENDENT_AMBULATORY_CARE_PROVIDER_SITE_OTHER): Payer: BLUE CROSS/BLUE SHIELD | Admitting: "Endocrinology

## 2016-05-31 ENCOUNTER — Telehealth (INDEPENDENT_AMBULATORY_CARE_PROVIDER_SITE_OTHER): Payer: Self-pay | Admitting: "Endocrinology

## 2016-05-31 ENCOUNTER — Other Ambulatory Visit (INDEPENDENT_AMBULATORY_CARE_PROVIDER_SITE_OTHER): Payer: Self-pay | Admitting: *Deleted

## 2016-05-31 DIAGNOSIS — E1065 Type 1 diabetes mellitus with hyperglycemia: Principal | ICD-10-CM

## 2016-05-31 DIAGNOSIS — IMO0001 Reserved for inherently not codable concepts without codable children: Secondary | ICD-10-CM

## 2016-05-31 MED ORDER — INSULIN PEN NEEDLE 32G X 4 MM MISC: 5 refills | Status: DC

## 2016-05-31 MED ORDER — INSULIN GLARGINE 100 UNIT/ML SOLOSTAR PEN: PEN_INJECTOR | SUBCUTANEOUS | 12 refills | Status: DC

## 2016-05-31 MED ORDER — INSULIN ASPART 100 UNIT/ML FLEXPEN: PEN_INJECTOR | SUBCUTANEOUS | 5 refills | Status: DC

## 2016-05-31 NOTE — Telephone Encounter (Signed)
Made in error

## 2016-06-03 ENCOUNTER — Telehealth (INDEPENDENT_AMBULATORY_CARE_PROVIDER_SITE_OTHER): Payer: Self-pay | Admitting: "Endocrinology

## 2016-06-03 NOTE — Telephone Encounter (Signed)
Patient has decided to send back her pump and do shots instead. Mother is ok with this, but would like to get Dr Loren Racer opinion as well.

## 2016-06-03 NOTE — Telephone Encounter (Signed)
Routed to provider

## 2016-06-04 ENCOUNTER — Telehealth (INDEPENDENT_AMBULATORY_CARE_PROVIDER_SITE_OTHER): Payer: Self-pay | Admitting: "Endocrinology

## 2016-06-04 NOTE — Telephone Encounter (Signed)
1. Mother called to discuss Jessica Kelley's desire to change from pump therapy to MDI therapy.  2. When I returned the call, [facility] answered. What mom really wanted to know was if we had sent in the prescription for her insulins. When they called the insurance company today they learned that we had already done so.  3. I asked her to call me on Sunday, January 28th to discuss her BGs.  Tillman Sers, MD, CDE

## 2016-06-07 ENCOUNTER — Telehealth (INDEPENDENT_AMBULATORY_CARE_PROVIDER_SITE_OTHER): Payer: Self-pay | Admitting: Family

## 2016-06-07 NOTE — Telephone Encounter (Signed)
Mom Jessica Kelley called and said that [place] contacted her about scheduling the IVIG for Jessica Kelley, Jessica Kelley and that the orders are wrong. She said that Dr Lerry Paterson meant to order 5 days of steroids, not IVIG. I read the portion of Dr Lillie Fragmin note to [facility] and she said that was wrong and that she would call Dr Lerry Paterson and talk to him because what was discussed was 5 days of steroids and that [name] could not tolerate 5 days of IVIG.  Mom can be reached at [phone]. TG

## 2016-06-07 NOTE — Telephone Encounter (Signed)
I called mother back.  I'm going to wait until I have explicit orders about how much Solu-Medrol she should receive every day.  I'm not all certain how this is going to be a long-term treatment.

## 2016-06-11 ENCOUNTER — Ambulatory Visit (INDEPENDENT_AMBULATORY_CARE_PROVIDER_SITE_OTHER): Payer: BLUE CROSS/BLUE SHIELD | Admitting: Family Medicine

## 2016-06-11 ENCOUNTER — Encounter: Payer: Self-pay | Admitting: Student

## 2016-06-11 ENCOUNTER — Ambulatory Visit (HOSPITAL_BASED_OUTPATIENT_CLINIC_OR_DEPARTMENT_OTHER)
Admission: RE | Admit: 2016-06-11 | Discharge: 2016-06-11 | Disposition: A | Discharge status: Home / Self Care | Payer: BLUE CROSS/BLUE SHIELD | Source: Ambulatory Visit | Attending: Family Medicine | Admitting: Family Medicine

## 2016-06-11 VITALS — BP 108/80 | HR 107 | Temp 98.1°F | Resp 16 | Ht 63.0 in | Wt 125.2 lb

## 2016-06-11 DIAGNOSIS — R079 Chest pain, unspecified: Secondary | ICD-10-CM | POA: Insufficient documentation

## 2016-06-11 DIAGNOSIS — R002 Palpitations: Secondary | ICD-10-CM

## 2016-06-11 DIAGNOSIS — Z0000 Encounter for general adult medical examination without abnormal findings: Secondary | ICD-10-CM

## 2016-06-11 DIAGNOSIS — N63 Unspecified lump in unspecified breast: Secondary | ICD-10-CM | POA: Diagnosis not present

## 2016-06-11 DIAGNOSIS — E039 Hypothyroidism, unspecified: Secondary | ICD-10-CM

## 2016-06-11 DIAGNOSIS — M4184 Other forms of scoliosis, thoracic region: Secondary | ICD-10-CM | POA: Insufficient documentation

## 2016-06-11 NOTE — Patient Instructions (Signed)
Palpitations A palpitation is the feeling that your heartbeat is irregular or is faster than normal. It may feel like your heart is fluttering or skipping a beat. Palpitations are usually not a serious problem. They may be caused by many things, including smoking, caffeine, alcohol, stress, and certain medicines. Although most causes of palpitations are not serious, palpitations can be a sign of a serious medical problem. In some cases, you may need further medical evaluation. Follow these instructions at home: Pay attention to any changes in your symptoms. Take these actions to help with your condition:  Avoid the following: ? Caffeinated coffee, tea, soft drinks, diet pills, and energy drinks. ? Chocolate. ? Alcohol.  Do not use any tobacco products, such as cigarettes, chewing tobacco, and e-cigarettes. If you need help quitting, ask your health care provider.  Try to reduce your stress and anxiety. Things that can help you relax include: ? Yoga. ? Meditation. ? Physical activity, such as swimming, jogging, or walking. ? Biofeedback. This is a method that helps you learn to use your mind to control things in your body, such as your heartbeats.  Get plenty of rest and sleep.  Take over-the-counter and prescription medicines only as told by your health care provider.  Keep all follow-up visits as told by your health care provider. This is important.  Contact a health care provider if:  You continue to have a fast or irregular heartbeat after 24 hours.  Your palpitations occur more often. Get help right away if:  You have chest pain or shortness of breath.  You have a severe headache.  You feel dizzy or you faint. This information is not intended to replace advice given to you by your health care provider. Make sure you discuss any questions you have with your health care provider. Document Released: 05/03/2000 Document Revised: 10/09/2015 Document Reviewed: 01/19/2015 Elsevier  Interactive Patient Education  2017 Elsevier Inc.  

## 2016-06-11 NOTE — Progress Notes (Signed)
Pre visit review using our clinic review tool, if applicable. No additional management support is needed unless otherwise documented below in the visit note. 

## 2016-06-11 NOTE — Progress Notes (Signed)
Subjective:    Patient ID: Jessica Kelley, female    DOB: 01-May-1991, 26 y.o.   MRN: [id number]  Chief Complaint  Patient presents with  . Acute Visit    pt c/o chest pain and palpitation for about 1-2 weeks, pt also c/o lump on left breast about 2 days ago.    HPI Patient is in today for lump on left breast. Patient also report having chest pain, and heart palpitation for approximately 1-2 weeks.--- she has felt tired and not herself for 4 weeks.    Past Medical History:  Diagnosis Date  . Celiac disease   . 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 Diabetes type I ([place])    Dr. Tobe Sos  . Glaucoma   . Hypothyroidism   . Macular degeneration   . Pilonidal cyst   . Scoliosis   . Wyburn-Mason syndrome     Past Surgical History:  Procedure Laterality Date  . CATARACT EXTRACTION  06/25/06  . FOOT ARTHROPLASTY     left foot moved and stretched tendons   . GLAUCOMA SURGERY  02/12/06  . HIP ARTHROPLASTY     right never bipopsy  . PORTACATH PLACEMENT  2002-2004  . removal of vascular port    . Sciatic Nerve Muscle Biopsy      Family History  Problem Relation Age of Onset  . Prostate cancer Maternal Grandfather   . Cancer Maternal Grandfather     prostate  . Coronary artery disease Paternal Grandfather   . Diabetes Sister   . Other Sister     thyroid issues  . Thyroid disease Mother   . Cancer Paternal Grandmother     Died at 7  . Hyperlipidemia      Social History   Social History  . Marital status: Single    Spouse name: N/A  . Number of children: N/A  . Years of education: N/A   Occupational History  . Not on file.   Social History Main Topics  . Smoking status: Never Smoker  . Smokeless tobacco: Never Used     Comment: passive smoke exposure  . Alcohol use No  . Drug use: No  . Sexual activity: No   Other Topics Concern  . Not on file   Social History Narrative   Jessica Kelley is a 27 yo woman who attends [employer].   She is doing excellent.    She is employed at [employer]   She lives with her parents and has one sister, 57 yo.   She enjoys working, camping, and walking outside.    Outpatient Medications Prior to Visit  Medication Sig Dispense Refill  . Cholecalciferol (VITAMIN D) 1000 UNITS capsule Take 1,000 Units by mouth daily.      . Cyanocobalamin (VITAMIN B-12 PO) Take by mouth.    . gabapentin (NEURONTIN) 100 MG capsule TAKE 2 CAPSULES BY MOUTH EVERY DAY AT 8 AM, THEN TAKE 2 CAPSULES AT 4 PM, THEN TAKE 3 CAPSULES AT 11PM 210 capsule 5  . GLUCAGON EMERGENCY 1 MG injection USE AS DIRECTED 1 kit 9  . Immune Globulin, Human, 5 GM/50ML SOLN Inject into the vein.    Marland Kitchen insulin aspart (NOVOLOG FLEXPEN) 100 UNIT/ML FlexPen Inject up to 50 units subcutaneous daily 5 pen 5  . Insulin Glargine (LANTUS SOLOSTAR) 100 UNIT/ML Solostar Pen Inject up to 50 units daily 5 pen 12  . Insulin Pen Needle (BD PEN NEEDLE NANO U/F) 32G X 4 MM MISC Use  with insulin pen 6x day as directed by provider 200 each 5  . lisinopril (PRINIVIL,ZESTRIL) 2.5 MG tablet Take 1 tablet (2.5 mg total) by mouth daily. 30 tablet 11  . lisinopril (PRINIVIL,ZESTRIL) 2.5 MG tablet TAKE 1 TABLET (2.5 MG TOTAL) BY MOUTH DAILY. 30 tablet 10  . methimazole (TAPAZOLE) 5 MG tablet Take two tablets (10 mg) methimazole in the morning and 1 tablet (5 mg) in the evening by mouth. 90 tablet 5  . methylPREDNISolone sodium succinate (SOLU-MEDROL) 500 MG injection Give 516m IV in 50 ml NS over 1 hour x 1 every 4 weeks. Given by [place] each 12  . mycophenolate (CELLCEPT) 500 MG tablet TAKE 2 TABLETS (1000MG) BY MOUTH IN THE MORNING AND IN THE EVENING 120 tablet 5  . predniSONE (DELTASONE) 20 MG tablet Take 1 tablet by mouth the day before, day of and day after IVIG. 36 tablet 0  . propranolol (INDERAL) 10 MG tablet TAKE TWO TABLETS BY MOUTH THREE TIMES DAILY 180 tablet 0   No facility-administered medications prior to visit.      Allergies  Allergen Reactions  . Gluten Meal   . Wheat Bran     Review of Systems  Constitutional: Negative for fever.  HENT: Negative for congestion.   Eyes: Negative for blurred vision.  Respiratory: Negative for cough.   Cardiovascular: Positive for chest pain and palpitations.  Gastrointestinal: Negative for vomiting.  Musculoskeletal: Negative.  Negative for back pain.  Skin: Negative for rash.  Neurological: Negative for loss of consciousness and headaches.  Psychiatric/Behavioral: Negative.        Objective:    Physical Exam  Constitutional: She is oriented to person, place, and time. She appears well-developed and well-nourished.  HENT:  Head: Normocephalic and atraumatic.  Eyes: Conjunctivae and EOM are normal.  Neck: Normal range of motion. Neck supple. No JVD present. Carotid bruit is not present. No thyromegaly present.  Cardiovascular: Normal rate, regular rhythm and normal heart sounds.   No murmur heard. Pulmonary/Chest: Effort normal and breath sounds normal. No respiratory distress. She has no wheezes. She has no rales. She exhibits no tenderness.  Genitourinary:  Genitourinary Comments: Breast --- pea sized nodule at 11 o'clock L breast Non tender   Musculoskeletal: She exhibits no edema.  Walks with walker  Neurological: She is alert and oriented to person, place, and time.  Psychiatric: She has a normal mood and affect.  Nursing note and vitals reviewed. ekg- sinus tach BP 108/80 (BP Location: Left Arm, Patient Position: Sitting, Cuff Size: Normal)   Pulse (!) 107   Temp 98.1 F (36.7 C) (Oral)   Resp 16   Ht 5' 3"  (1.6 m)   Wt 125 lb 3.2 oz (56.8 kg)   LMP 06/04/2016   SpO2 97%   BMI 22.18 kg/m  Wt Readings from Last 3 Encounters:  06/11/16 125 lb 3.2 oz (56.8 kg)  02/22/16 122 lb (55.3 kg)  10/18/15 117 lb (53.1 kg)     Lab Results  Component Value Date   WBC 4.2 06/12/2016   HGB 13.1 06/12/2016   HCT 38.6 06/12/2016   PLT  180.0 06/12/2016   GLUCOSE 259 (H) 06/12/2016   CHOL 214 (HH) 04/25/2008   TRIG 126 04/25/2008   HDL 58.5 04/25/2008   LDLDIRECT 136.9 04/25/2008   ALT 18 06/12/2016   AST 23 06/12/2016   NA 133 (L) 06/12/2016   K 3.5 06/12/2016   CL 101 06/12/2016   CREATININE 0.56 06/12/2016  BUN 14 06/12/2016   CO2 27 06/12/2016   TSH 1.42 02/22/2016   INR 0.99 09/01/2012   HGBA1C 10.7 02/22/2016   MICROALBUR 0.6 02/22/2016    Lab Results  Component Value Date   TSH 1.42 02/22/2016   Lab Results  Component Value Date   WBC 4.2 06/12/2016   HGB 13.1 06/12/2016   HCT 38.6 06/12/2016   MCV 88.9 06/12/2016   PLT 180.0 06/12/2016   Lab Results  Component Value Date   NA 133 (L) 06/12/2016   K 3.5 06/12/2016   CO2 27 06/12/2016   GLUCOSE 259 (H) 06/12/2016   BUN 14 06/12/2016   CREATININE 0.56 06/12/2016   BILITOT 0.3 06/12/2016   ALKPHOS 59 06/12/2016   AST 23 06/12/2016   ALT 18 06/12/2016   PROT 8.7 (H) 06/12/2016   ALBUMIN 2.8 (L) 06/12/2016   CALCIUM 8.3 (L) 06/12/2016   ANIONGAP 5 08/18/2014   GFR 142.27 06/12/2016   Lab Results  Component Value Date   CHOL 214 (HH) 04/25/2008   Lab Results  Component Value Date   HDL 58.5 04/25/2008   No results found for: [facility] Lab Results  Component Value Date   TRIG 126 04/25/2008   Lab Results  Component Value Date   CHOLHDL 3.7 CALC 04/25/2008   Lab Results  Component Value Date   HGBA1C 10.7 02/22/2016       Assessment & Plan:   Problem List Items Addressed This Visit      Unprioritized   Chest pain    Check labs If symptoms return / worsen-- go to ER ekg -- sinus tach      Relevant Orders   EKG 12-Lead (Completed)   D-Dimer, Quantitative (Completed)   DG Chest 2 View (Completed)   Palpitations   Relevant Orders   EKG 12-Lead (Completed)   D-Dimer, Quantitative (Completed)   DG Chest 2 View (Completed)    Other Visit Diagnoses    Breast nodule    -  Primary   Relevant Orders   US BREAST  COMPLETE UNI LEFT INC AXILLA   Hypothyroidism, unspecified type       Relevant Orders   TSH+T4F+T3Free   Preventative health care       Relevant Orders   Comprehensive metabolic panel (Completed)   CBC (Completed)      I am having Ms. Yodis maintain her Vitamin D, Cyanocobalamin (VITAMIN B-12 PO), methylPREDNISolone sodium succinate, lisinopril, Immune Globulin (Human), gabapentin, methimazole, mycophenolate, lisinopril, GLUCAGON EMERGENCY, predniSONE, propranolol, insulin aspart, Insulin Glargine, and Insulin Pen Needle.  No orders of the defined types were placed in this encounter.   CMA served as Education administrator during this visit. History, Physical and Plan performed by medical provider. Documentation and orders reviewed and attested to.   [name], DO

## 2016-06-12 ENCOUNTER — Encounter (HOSPITAL_BASED_OUTPATIENT_CLINIC_OR_DEPARTMENT_OTHER): Payer: Self-pay

## 2016-06-12 ENCOUNTER — Other Ambulatory Visit: Payer: BLUE CROSS/BLUE SHIELD

## 2016-06-12 ENCOUNTER — Emergency Department (HOSPITAL_BASED_OUTPATIENT_CLINIC_OR_DEPARTMENT_OTHER): Payer: BLUE CROSS/BLUE SHIELD

## 2016-06-12 ENCOUNTER — Other Ambulatory Visit: Payer: Self-pay

## 2016-06-12 ENCOUNTER — Emergency Department (HOSPITAL_BASED_OUTPATIENT_CLINIC_OR_DEPARTMENT_OTHER)
Admission: EM | Admit: 2016-06-12 | Discharge: 2016-06-12 | Disposition: A | Discharge status: Home / Self Care | Payer: BLUE CROSS/BLUE SHIELD | Attending: Emergency Medicine | Admitting: Emergency Medicine

## 2016-06-12 ENCOUNTER — Telehealth: Payer: Self-pay | Admitting: Internal Medicine

## 2016-06-12 ENCOUNTER — Encounter: Payer: Self-pay | Admitting: Family Medicine

## 2016-06-12 DIAGNOSIS — R002 Palpitations: Secondary | ICD-10-CM

## 2016-06-12 DIAGNOSIS — R079 Chest pain, unspecified: Secondary | ICD-10-CM | POA: Insufficient documentation

## 2016-06-12 DIAGNOSIS — R7989 Other specified abnormal findings of blood chemistry: Secondary | ICD-10-CM

## 2016-06-12 DIAGNOSIS — E109 Type 1 diabetes mellitus without complications: Secondary | ICD-10-CM | POA: Insufficient documentation

## 2016-06-12 DIAGNOSIS — R0602 Shortness of breath: Secondary | ICD-10-CM | POA: Diagnosis present

## 2016-06-12 DIAGNOSIS — E039 Hypothyroidism, unspecified: Secondary | ICD-10-CM | POA: Diagnosis not present

## 2016-06-12 DIAGNOSIS — R0600 Dyspnea, unspecified: Secondary | ICD-10-CM | POA: Insufficient documentation

## 2016-06-12 HISTORY — DX: Palpitations: R00.2

## 2016-06-12 HISTORY — DX: Chest pain, unspecified: R07.9

## 2016-06-12 LAB — CBC
HEMATOCRIT: 38.6 % (ref 36.0–46.0)
Hemoglobin: 13.1 g/dL (ref 12.0–15.0)
MCHC: 33.8 g/dL (ref 30.0–36.0)
MCV: 88.9 fl (ref 78.0–100.0)
Platelets: 180 10*3/uL (ref 150.0–400.0)
RBC: 4.34 Mil/uL (ref 3.87–5.11)
RDW: 14.3 % (ref 11.5–15.5)
WBC: 4.2 10*3/uL (ref 4.0–10.5)

## 2016-06-12 LAB — COMPREHENSIVE METABOLIC PANEL
ALT: 18 U/L (ref 0–35)
AST: 23 U/L (ref 0–37)
Albumin: 2.8 g/dL — ABNORMAL LOW (ref 3.5–5.2)
Alkaline Phosphatase: 59 U/L (ref 39–117)
BILIRUBIN TOTAL: 0.3 mg/dL (ref 0.2–1.2)
BUN: 14 mg/dL (ref 6–23)
CHLORIDE: 101 meq/L (ref 96–112)
CO2: 27 meq/L (ref 19–32)
CREATININE: 0.56 mg/dL (ref 0.40–1.20)
Calcium: 8.3 mg/dL — ABNORMAL LOW (ref 8.4–10.5)
GFR: 142.27 mL/min (ref >60.00)
Glucose, Bld: 259 mg/dL — ABNORMAL HIGH (ref 70–99)
Potassium: 3.5 mEq/L (ref 3.5–5.1)
SODIUM: 133 meq/L — AB (ref 135–145)
Total Protein: 8.7 g/dL — ABNORMAL HIGH (ref 6.0–8.3)

## 2016-06-12 LAB — D-DIMER, QUANTITATIVE (NOT AT ARMC): D DIMER QUANT: 0.51 ug{FEU}/mL — AB (ref <0.50)

## 2016-06-12 LAB — HCG, SERUM, QUALITATIVE: PREG SERUM: NEGATIVE

## 2016-06-12 MED ORDER — IOPAMIDOL (ISOVUE-370) INJECTION 76%
100.0000 mL | Freq: Once | INTRAVENOUS | Status: AC | PRN
Start: 2016-06-12 — End: 2016-06-12
  Administered 2016-06-12: 100 mL via INTRAVENOUS

## 2016-06-12 MED ORDER — ALBUTEROL SULFATE HFA 108 (90 BASE) MCG/ACT IN AERS
1.0000 | INHALATION_SPRAY | RESPIRATORY_TRACT | Status: DC | PRN
  Administered 2016-06-12: 2 via RESPIRATORY_TRACT
  Filled 2016-06-12: qty 6.7

## 2016-06-12 MED ORDER — ALBUTEROL SULFATE (2.5 MG/3ML) 0.083% IN NEBU
5.0000 mg | INHALATION_SOLUTION | Freq: Once | RESPIRATORY_TRACT | Status: AC
  Administered 2016-06-12: 5 mg via RESPIRATORY_TRACT
  Filled 2016-06-12: qty 6

## 2016-06-12 NOTE — Telephone Encounter (Signed)
Covering for Dr Etter Sjogren: Chest x-ray negative, EKG discuss with cardiology-- sinus tachycardia. Labs: Normal except for + d-dimer. TFT pending Spoke w/ PCP then  with the patient, explained what the d-dimer is Plan:  CT chest angiogram. Message sent to MD on call regards the pt  (She is not on birth control, take a number of medications including acei, tapazole, CellCept. Last menstrual period a week ago.)

## 2016-06-12 NOTE — ED Triage Notes (Signed)
C/o SOB, CP x 1 month-worse x 1 week-seen by PCP yestesterday-labs today-sent for elevated ddimer-NAD-steady gait with own walker

## 2016-06-12 NOTE — Assessment & Plan Note (Signed)
Check labs If symptoms return / worsen-- go to ER ekg -- sinus tach

## 2016-06-12 NOTE — ED Notes (Signed)
IV attempt LU arm, L lateral FA, R AC unsuccessful. Requested 2nd nurse to bedside with Ultrasound IV placement.

## 2016-06-12 NOTE — ED Notes (Signed)
Patient went to MD today for evaluation of some chest pain shes had intermittent for the past few days, and a "lump" in her left chest. Patient receives IVIG and is difficult IV start. Patient is A&Ox4, father at bedside.

## 2016-06-12 NOTE — ED Provider Notes (Signed)
[facility] DEPT [facility] Provider Note   CSN: [id number] Arrival date & time: 06/12/16  1800  By signing my name below, I, Jessica Kelley, attest that this documentation has been prepared under the direction and in the presence of [name], MD. Electronically Signed: Jeanell Kelley, Scribe. 06/12/2016. 6:50 PM.  History   Chief Complaint Chief Complaint  Patient presents with  . Shortness of Breath   The history is provided by the patient. No language interpreter was used.   HPI Comments: Jessica Kelley is a 26 y.o. female who presents to the Emergency Department complaining of constant moderate left-sided chest pain that started about a month ago. She states her pain has been worsening over the past week and she has a "lump" in her chest area. She was sent here from her PCP for diagnostic studies Because of elevated d-dimer. She describes the pain as an aching sensation at rest and a sharp sensation when turning while laying down. She reports associated SOB. She denies any fever, chills, or other complaints.     PCP: [name], DO  Past Medical History:  Diagnosis Date  . Celiac disease   . 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 Diabetes type I ([place])    Dr. Tobe Sos  . Glaucoma   . Hypothyroidism   . Macular degeneration   . Pilonidal cyst   . Scoliosis   . 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 Chest pain 06/12/2016  . Palpitations 06/12/2016  . 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 Abnormality of gait 11/09/2012  . Migraine without aura 11/09/2012  . Chronic tension type headache 11/09/2012  . Insomnia, unspecified 11/09/2012  . Chronic lymphocytic thyroiditis 11/09/2012  . 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 Encounter for long-term (current) use of other medications 11/09/2012  . Thyrotoxicosis with diffuse goiter 02/25/2012  . Thyroiditis, autoimmune 02/25/2012    . 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 Goiter 02/25/2012  . Pilonidal cyst with abscess 12/12/2010  . UNSPECIFIED ANEMIA 03/27/2009  . VIRAL URI 06/14/2008  . ELEVATED BLOOD PRESSURE WITHOUT DIAGNOSIS OF HYPERTENSION 05/11/2008  . KNEE PAIN, RIGHT 01/26/2008  . UNSPECIFIED TACHYCARDIA 12/07/2007  . HEADACHE 06/04/2007  . HYPOTHYROIDISM NOS 02/17/2007  . FOOT PAIN, RIGHT 02/17/2007  . SCOLIOSIS NEC 02/17/2007  . DIABETES MELLITUS, TYPE I 10/07/2006  . DMYLNATING POLYNEURITIS, CHRONIC INFLAMMATORY 10/07/2006  . MACULAR DEGENERATION 10/07/2006  . DISEASE, CELIAC 10/07/2006    Past Surgical History:  Procedure Laterality Date  . CATARACT EXTRACTION  06/25/06  . FOOT ARTHROPLASTY     left foot moved and stretched tendons   . GLAUCOMA SURGERY  02/12/06  . HIP ARTHROPLASTY     right never bipopsy  . PORTACATH PLACEMENT  2002-2004  . removal of vascular port    . Sciatic Nerve Muscle Biopsy      OB History    No data available       Home Medications    Prior to Admission medications   Medication Sig Start Date End Date Taking? Authorizing Provider  Cholecalciferol (VITAMIN D) 1000 UNITS capsule Take 1,000 Units by mouth daily.      Historical Provider, MD  Cyanocobalamin (VITAMIN B-12 PO) Take by mouth.    Historical Provider, MD  gabapentin (NEURONTIN) 100 MG capsule TAKE 2 CAPSULES BY MOUTH EVERY DAY AT 8 AM, THEN TAKE 2 CAPSULES AT 4 PM, THEN TAKE 3  CAPSULES AT 11PM 01/02/15   Jodi Geralds, MD  GLUCAGON EMERGENCY 1 MG injection USE AS DIRECTED 01/25/16   Sherrlyn Hock, MD  Immune Globulin, Human, 5 GM/50ML SOLN Inject into the vein.    Historical Provider, MD  insulin aspart (NOVOLOG FLEXPEN) 100 UNIT/ML FlexPen Inject up to 50 units subcutaneous daily 05/31/16   Sherrlyn Hock, MD  Insulin Glargine (LANTUS SOLOSTAR) 100 UNIT/ML Solostar Pen Inject up to 50 units daily 05/31/16   Sherrlyn Hock, MD  Insulin Pen Needle  (BD PEN NEEDLE NANO U/F) 32G X 4 MM MISC Use with insulin pen 6x day as directed by provider 05/31/16   Sherrlyn Hock, MD  lisinopril (PRINIVIL,ZESTRIL) 2.5 MG tablet Take 1 tablet (2.5 mg total) by mouth daily. 02/23/14   Sherrlyn Hock, MD  lisinopril (PRINIVIL,ZESTRIL) 2.5 MG tablet TAKE 1 TABLET (2.5 MG TOTAL) BY MOUTH DAILY. 11/13/15   Sherrlyn Hock, MD  methimazole (TAPAZOLE) 5 MG tablet Take two tablets (10 mg) methimazole in the morning and 1 tablet (5 mg) in the evening by mouth. 06/02/15   Sherrlyn Hock, MD  methylPREDNISolone sodium succinate (SOLU-MEDROL) 500 MG injection Give 526m IV in 50 ml NS over 1 hour x 1 every 4 weeks. Given by AMasury3/17/15   TRockwell Germany NP  mycophenolate (CELLCEPT) 500 MG tablet TAKE 2 TABLETS (1000MG) BY MOUTH IN THE MORNING AND IN THE EVENING 10/17/15   TRockwell Germany NP  predniSONE (DELTASONE) 20 MG tablet Take 1 tablet by mouth the day before, day of and day after IVIG. 04/26/16   TRockwell Germany NP  propranolol (INDERAL) 10 MG tablet TAKE TWO TABLETS BY MOUTH THREE TIMES DAILY 05/06/16   MSherrlyn Hock MD    Family History Family History  Problem Relation Age of Onset  . Prostate cancer Maternal Grandfather   . Cancer Maternal Grandfather     prostate  . Coronary artery disease Paternal Grandfather   . Diabetes Sister   . Other Sister     thyroid issues  . Thyroid disease Mother   . Cancer Paternal Grandmother     Died at 625 . Hyperlipidemia      Social History Social History  Substance Use Topics  . Smoking status: Never Smoker  . Smokeless tobacco: Never Used     Comment: passive smoke exposure  . Alcohol use No     Allergies   Gluten meal and Wheat bran   Review of Systems Review of Systems  Constitutional: Negative for chills and fever.  Respiratory: Positive for shortness of breath. Negative for cough, chest tightness and wheezing.   Cardiovascular: Positive for chest pain. Negative  for palpitations and leg swelling.  Gastrointestinal: Negative for abdominal pain, constipation, diarrhea, nausea and vomiting.  Genitourinary: Negative for dysuria, flank pain and frequency.  Musculoskeletal: Negative for back pain, myalgias, neck pain and neck stiffness.  Skin: Negative for rash and wound.  Neurological: Negative for dizziness, weakness, light-headedness, numbness and headaches.  All other systems reviewed and are negative.    Physical Exam Updated Vital Signs BP 132/91 (BP Location: Right Arm)   Pulse 102   Temp 98.4 F (36.9 C) (Oral)   Resp 18   Ht 5' 3"  (1.6 m)   Wt 125 lb (56.7 kg)   LMP 06/04/2016   SpO2 99%   BMI 22.14 kg/m   Physical Exam  Constitutional: She is oriented to person, place, and time. She appears well-developed and  well-nourished. No distress.  HENT:  Head: Normocephalic and atraumatic.  Mouth/Throat: Oropharynx is clear and moist. No oropharyngeal exudate.  Eyes: EOM are normal. Pupils are equal, round, and reactive to light.  Neck: Normal range of motion. Neck supple.  Cardiovascular: Normal rate and regular rhythm.  Exam reveals no gallop and no friction rub.   No murmur heard. Pulmonary/Chest: Effort normal. No respiratory distress. She has no wheezes. She has no rales. She exhibits no tenderness.  Patient with diminished expiratory sounds especially in bilateral bases. She has a small left-sided parasternal subcentimeter superficial mass consistent with lymph node. It is mobile. Nonfluctuant. Pectus excavatum  Abdominal: Soft. Bowel sounds are normal. There is no tenderness. There is no rebound and no guarding.  Musculoskeletal: Normal range of motion. She exhibits no edema or tenderness.  No lower extremity swelling, asymmetry or tenderness.  Neurological: She is alert and oriented to person, place, and time.  Moving all extremities without focal deficit. Sensation intact.  Skin: Skin is warm and dry. No rash noted. No erythema.    Psychiatric: She has a normal mood and affect. Her behavior is normal.  Nursing note and vitals reviewed.    ED Treatments / Results  DIAGNOSTIC STUDIES: Oxygen Saturation is 99% on RA, normal by my interpretation.    COORDINATION OF CARE: 6:54 PM- Pt advised of plan for treatment and pt agrees.  Labs (all labs ordered are listed, but only abnormal results are displayed) Labs Reviewed  HCG, SERUM, QUALITATIVE    EKG  EKG Interpretation  Date/Time:  Wednesday June 12 2016 18:43:27 EST Ventricular Rate:  96 PR Interval:    QRS Duration: 74 QT Interval:  344 QTC Calculation: 435 R Axis:   20 Text Interpretation:  Sinus rhythm Confirmed by Lita Mains  MD, Julina Altmann ([id number]) on 06/12/2016 7:54:37 PM       Radiology Dg Chest 2 View  Result Date: 06/12/2016 CLINICAL DATA:  Chest pain, lump in the mid chest for several days EXAM: CHEST  2 VIEW COMPARISON:  Chest x-ray of 08/18/2014 FINDINGS: No active infiltrate or effusion is seen. Mediastinal and hilar contours are unremarkable. The heart is within normal limits in size. No acute bony abnormality is seen. However, there is a moderate thoracic scoliosis present. IMPRESSION: 1. No active cardiopulmonary disease. 2. Moderate thoracic scoliosis convex to the left. Electronically Signed   By: Ivar Drape M.D.   On: 06/12/2016 08:32   Ct Angio Chest Pe W And/or Wo Contrast  Result Date: 06/12/2016 CLINICAL DATA:  Left-sided chest pain for 1 month. EXAM: CT ANGIOGRAPHY CHEST WITH CONTRAST TECHNIQUE: Multidetector CT imaging of the chest was performed using the standard protocol during bolus administration of intravenous contrast. Multiplanar CT image reconstructions and MIPs were obtained to evaluate the vascular anatomy. CONTRAST:  100 cc Isovue 370 COMPARISON:  No comparison studies available. FINDINGS: Cardiovascular: The heart size is normal. No pericardial effusion.No thoracic aortic aneurysm. No dissection of the thoracic aorta. No  filling defect within the opacified pulmonary arteries to suggest the presence of an acute pulmonary embolus. Mediastinum/Nodes: No mediastinal lymphadenopathy. There is no hilar lymphadenopathy. The esophagus has normal imaging features. There is no axillary lymphadenopathy. Lungs/Pleura: No focal airspace consolidation. No pulmonary edema or pleural effusion. No suspicious pulmonary nodule or mass. Upper Abdomen: Unremarkable. Musculoskeletal: Bone windows reveal no worrisome lytic or sclerotic osseous lesions. Review of the MIP images confirms the above findings. IMPRESSION: 1. No CT evidence for acute pulmonary embolus. 2. No acute findings in  the chest. Electronically Signed   By: Misty [name] M.D.   On: 06/12/2016 20:50    Procedures Procedures (including critical care time)  Medications Ordered in ED Medications  albuterol (PROVENTIL HFA;VENTOLIN HFA) 108 (90 Base) MCG/ACT inhaler 1-2 puff (2 puffs Inhalation Given 06/12/16 2258)  iopamidol (ISOVUE-370) 76 % injection 100 mL (100 mLs Intravenous Contrast Given 06/12/16 2021)  albuterol (PROVENTIL) (2.5 MG/3ML) 0.083% nebulizer solution 5 mg (5 mg Nebulization Given 06/12/16 2140)     Initial Impression / Assessment and Plan / ED Course  I have reviewed the triage vital signs and the nursing notes.  Pertinent labs & imaging results that were available during my care of the patient were reviewed by me and considered in my medical decision making (see chart for details).   CT without evidence of PE. Mild improvement of air movement with nebulized treatment. We'll give MDI and have follow-up with her primary physician. Return precautions given.    Final Clinical Impressions(s) / ED Diagnoses   Final diagnoses:  Dyspnea, unspecified type    New Prescriptions Discharge Medication List as of 06/12/2016 10:46 PM     I personally performed the services described in this documentation, which was scribed in my presence. The recorded  information has been reviewed and is accurate.       [name], MD 06/12/16 [phone]

## 2016-06-13 ENCOUNTER — Telehealth: Payer: Self-pay | Admitting: Family Medicine

## 2016-06-13 LAB — TSH+T4F+T3FREE
FREE T4: 1.02 ng/dL (ref 0.82–1.77)
T3, Free: 3 pg/mL (ref 2.0–4.4)
TSH: 2.2 u[IU]/mL (ref 0.450–4.500)

## 2016-06-13 NOTE — Telephone Encounter (Signed)
error 

## 2016-06-13 NOTE — Telephone Encounter (Signed)
Ct neg Breast US pending If palp cont-- consider referral to cardiology,  May try beta blocker if needed

## 2016-06-13 NOTE — Telephone Encounter (Signed)
Patients father came in this morning upset that he was charged 500 ED visit copay for CT. Says they were instructed to go to the ED to check in for a CT scan PCP's covering provider had ordered. He said they were taken to the back at for triaging after telling them numerous times they did not need to visit only the scan per providers instructions for an elevated d dimer and chest pain she had been experiencing. He said the orderly told him they had to ask the questions and room them to get the order for CT and that if the patient had a clot they had to treat her. He said he was under the impression that this was our process and maybe he was overreacting because he was in an ED. He say later a registration person came into the room to collect the copayment. He then questioned the staff member and they told him they had no control over it that he would have to contact patient accounting to straighten it out.   I called ed and spoke with supervisor over patient access and he said they could refund the 500 and he would have to work it out with billing later. I told him that would not help only frustrate the patient future and he said he would reach out to the clinical director to see what he could do. He said it was busy last night and there may have been some confusion when the patient was checking in. He said he would talk to the employee that checked her in.   I had [facility] call imaging to see why the stat CT scan was not already schedule and she was informed by registration that the person covering last night did not get to scheduling. She said she would inform her manager so this does not happen again.  I called the patient and told him that since the ED rendered services they could not overlook the copayment. I told him he would have to go through the Office of Patient experience to get this taken care of since this was not my service area.

## 2016-06-14 ENCOUNTER — Telehealth: Payer: Self-pay | Admitting: Behavioral Health

## 2016-06-14 NOTE — Telephone Encounter (Signed)
As discussed earlier--- please talk to pt experience on pt behalf to let them know what happened.   The pt should not be expected to pay -- this was the mistake of ER.  Pt and father told them over and over they were just there for the CT and the were repeatedly ignored .  The order was in the computer and telehone notes as well from Dr Larose Kells.

## 2016-06-14 NOTE — Telephone Encounter (Signed)
done

## 2016-06-14 NOTE — Telephone Encounter (Signed)
Update: Patient experience has been notified I called patient's father and let him know we are working on it. He stated he would call them as well and thanked the office for assisting. Marlee from [facility] is working to get this fixed for the patient.

## 2016-06-14 NOTE — Telephone Encounter (Signed)
Appointment scheduled for ED Hospital Follow-up visit with Dr. Carollee Herter on 06/28/16 at 2:30 PM.

## 2016-06-19 ENCOUNTER — Telehealth (INDEPENDENT_AMBULATORY_CARE_PROVIDER_SITE_OTHER): Payer: Self-pay | Admitting: "Endocrinology

## 2016-06-19 ENCOUNTER — Other Ambulatory Visit (INDEPENDENT_AMBULATORY_CARE_PROVIDER_SITE_OTHER): Payer: Self-pay | Admitting: *Deleted

## 2016-06-19 DIAGNOSIS — E1065 Type 1 diabetes mellitus with hyperglycemia: Principal | ICD-10-CM

## 2016-06-19 DIAGNOSIS — IMO0001 Reserved for inherently not codable concepts without codable children: Secondary | ICD-10-CM

## 2016-06-19 MED ORDER — LISINOPRIL 2.5 MG PO TABS: ORAL_TABLET | ORAL | 6 refills | Status: DC

## 2016-06-19 NOTE — Telephone Encounter (Signed)
Script sent  

## 2016-06-19 NOTE — Telephone Encounter (Signed)
°  Who's calling (name and relationship to patient) : Ander Purpura, mother Best contact number: [phone] Provider they see: Tobe Sos Reason for call: Please send rx for Lisinopril to [facility] on [address] [address] [facility] Dr. Allen Kell order will no longer cover this rx.     PRESCRIPTION REFILL ONLY  Name of prescription:  Pharmacy:

## 2016-06-20 ENCOUNTER — Other Ambulatory Visit: Payer: Self-pay | Admitting: "Endocrinology

## 2016-06-28 ENCOUNTER — Inpatient Hospital Stay: Payer: BLUE CROSS/BLUE SHIELD | Admitting: Family Medicine

## 2016-07-03 ENCOUNTER — Encounter: Payer: Self-pay | Admitting: *Deleted

## 2016-07-04 ENCOUNTER — Ambulatory Visit (INDEPENDENT_AMBULATORY_CARE_PROVIDER_SITE_OTHER): Payer: BLUE CROSS/BLUE SHIELD | Admitting: "Endocrinology

## 2016-07-09 ENCOUNTER — Other Ambulatory Visit: Payer: Self-pay | Admitting: Family

## 2016-07-11 ENCOUNTER — Ambulatory Visit (INDEPENDENT_AMBULATORY_CARE_PROVIDER_SITE_OTHER): Payer: BLUE CROSS/BLUE SHIELD | Admitting: "Endocrinology

## 2016-07-11 ENCOUNTER — Encounter (INDEPENDENT_AMBULATORY_CARE_PROVIDER_SITE_OTHER): Payer: Self-pay | Admitting: "Endocrinology

## 2016-07-11 VITALS — BP 122/68 | HR 90 | Wt 124.0 lb

## 2016-07-11 DIAGNOSIS — E1065 Type 1 diabetes mellitus with hyperglycemia: Secondary | ICD-10-CM

## 2016-07-11 DIAGNOSIS — IMO0001 Reserved for inherently not codable concepts without codable children: Secondary | ICD-10-CM

## 2016-07-11 LAB — POCT GLYCOSYLATED HEMOGLOBIN (HGB A1C): HEMOGLOBIN A1C: 11.5

## 2016-07-11 LAB — GLUCOSE, POCT (MANUAL RESULT ENTRY): POC GLUCOSE: 225 mg/dL — AB (ref 70–99)

## 2016-07-11 NOTE — Patient Instructions (Signed)
Follow up appointment in one month, either in a new patient slot or the last appointment of the day.

## 2016-07-11 NOTE — Progress Notes (Addendum)
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  HISTORY OF PRESENT ILLNESS: The patient is a 26 year-old Caucasian young woman. The patient was unaccompanied.  63. This 26 year-old Caucasian young woman with an already complicated medical history was first referred to me on 09/25/06 by her primary care provider, Dr. Garnet Koyanagi, for evaluation and management of type I diabetes mellitus, hypoglycemia, and a myriad of other autoimmune issues.   A. CIDP: At about age 68 the child began to limp.  At age 52 she was evaluated at [place] and diagnosed with CIDP. At the time I first met her she was under the care of Dr. Marcene Brawn of [facility] in [place]. She was on a regular regimen of intravenous treatments at home with IVIG and steroids on the days of therapy. The IVIG treatments were given monthly. She was also on cyclosporine A daily, alternating doses every other day. Because of the CIDP she had  significant problems with muscle atrophy and weakness, as well as multiple orthopedic issues. She was also being followed by Dr. Simonne Come, an orthopedist in [place]. On that first visit her legs and arms were quite weak. She found it very hard to walk or to exercise.  B. Type 1 diabetes mellitus: Her diabetes mellitus was also diagnosed at age 69, just prior to the diagnosis of CIDP. She had been started on a Medtronic Paradigm 712 insulin pump subsequently and was using Humalog lispro insulin in her pump. She had a very difficult time with blood glucose control. At the time of her steroids, her  sugars tended to run very high. However, when she was able to perform some physical activities, she tended to develop hypoglycemia. Her hemoglobin A1c on that first visit was 9.5%.  C. Celiac disease: The patient was diagnosed with celiac disease at approximately age 26. She had been on a gluten-free diet ever since.  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 She was followed both at [facility] and [facility] clinic.  2. Since that initial visit with me, I have followed her for the problems listed above plus other new problems that have developed over time.   A. CIDP: Her CIDP course has waxed and waned. At one point she had improved significantly, her medication doses had been reduced, she felt very much stronger, had more stamina, and was doing very well. Unfortunately, she later had a significant relapse of CIDP and lost much of the improvement that she had gained. Since going back to [facility] in the Spring of 2013, however, and starting a new and more intensified IVIG regimen, her strength had improved. She was still very weak physically and muscularly, however, and needed support to walk. As noted below, she gets stronger when her steroids are increased and weaker when the steroids are tapered. In the past two years Dr. Wyline Copas, MD, our senior pediatric neurologist, has assumed the role of managing her CIDP.  B. Type 1 diabetes mellitus: The patient's blood sugars have varied significantly with the course of her CIDP and the treatments for that disease. Her hemoglobin A1c values have ranged from  a low of 8.3% to a high of 14.0%. Most of her A1c values have been in the 8.4-9.6% range.   C. Celiac disease: The patient has really done quite well over the years. She rarely has had signs or symptoms of abdominal problems. Because her antibody levels  fluctuated a great deal, her celiac disease antibodies were negative in March 2012. She was mistakenly told that she did not have celiac disease and that she could resume a normal diet. As expected, resumption of a normal diet caused a recurrence of active celiac disease. In October 2012 she saw a gastroenterologist, Dr. Gale Journey, at [facility] who told her that she did have celiac disease. She is now back on her gluten-free diet and is asymptomatic again.  D. Autoimmune thyroid disease: The patient had a goiter and a TPO antibody level of 287.2 on her first visit in May of 2008, consistent with evolving Hashimoto's Disease. She occasionally had the sensations of swelling and discomfort in her anterior neck, which were also c/w flare-ups of Hashimoto's Disease. In early 2009 she developed increased tachycardia. Lab tests performed in March 2010 by her primary care provider showed a TSH of 0.01 and a T4 of 7.7. It was initially felt that the increase in tachycardia was likely due to a combination of autonomic neuropathy and Hashitoxicosis.  However on follow-up laboratory tests performed in July, her TSH was 0.05, free T4 3.9, and free T3 14.2. Her TSI level done on the assay used at the time was 2.2, with normal being 1.0 or less. It was then evident that she had developed Graves' Disease in the setting of pre-existing Hashimoto's Disease.  I initiated treatment with methimazole, 10 mg twice daily and propranolol 20 mg 3 times daily. Since then we've seen significant swings of her thyroid function tests, partly due to changes in methimazole doses, but also partly due to changes in her immunosuppressant therapies. During the last 2 years her TSH values have ranged from a low of 0.1432 to a high of 14.933. The dose of methimazole has been adjusted as needed to treat her Graves' disease. It has been my hope that her Hashimoto's disease will eventually cancel out her Graves' disease.   3.The patient's last [facility] visit was on  02/22/16. In the interim she has been healthy except for some bronchitis. "Please don't get mad at me. I'm here today to start on a new path to get my diabetes under better control."  A. Her energy level is "great".  Her CDIP is better. She feels stronger and has been walking more. She has a good appetite. 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 She uses a walker to help her walk from the parking lot to her job. She has not had many headaches. Dr. Gaynell Face continues to follow her for her CIDP management. Her female [place] shepherd service dog is working out well.     B. She remains on her regimen of IVIG and iv steroids. She continues to have IVIG every Monday and Thursday. She receives iv steroids on Monday. Her steroid dose was increased since her last visit. She remains on methimazole, 10 mg each morning and 5 mg each evening. She resumed taking lisinopril, 2.5 mg, twice daily. She is still taking propranolol, gabapentin, vitamin D, and a vitamin B complex pill. She stopped the iron after her last visit. She remains on her gluten-free diet.  C. She is no longer using her  insulin pump. She began a new MDI regimen in mid-December. She now takes 32 units of Lantus each evening. She takes Novolog according to our 150/50/15 plan. [Addendum on 07/15/16: When discussing her BGs by phone this evening she told me that her ICR is 5, not 15.] Unfortunately, she did not call in BGs after starting the MDI regimen as I had requested her to do.  4. Pertinent Review of Systems: Constitutional: The patient says she feels "pretty good". She does not feel as tired. She feels less mentally hyper, but still frequently has trouble falling asleep. She sleeps well when she does fall asleep. Her strength and stamina are better.    Eyes: Her contact lenses are helping. Last eye exam was in December 2017. There were no signs of diabetic eye disease.    Neck: The patient has had no problems  with anterior neck swelling and tenderness for a year or more.   Heart: The patient has not had any sensation of fast heart rate recently. The patient has no other complaints of palpitations, irregular heart beats, chest pain, or chest pressure.   Gastrointestinal:  She still has stomach upset after eating pork, but not with beef, chicken, and fish. Bowel movents seem normal. The patient has no complaints of postprandial bloating, excessive hunger, acid reflux, upset stomach, stomach aches or pains, diarrhea, or constipation.  Legs: Muscle mass and strength seem to be improved a bit. There are few complaints of numbness, tingling, burning, or pain. No edema is noted.  Feet: There are no obvious new foot problems. There are no complaints of numbness, tingling, burning, or pain. No edema is noted. Neurologic: She still requires the use of her walker, but is doing better. Sensation seems to be normal. Coordination is better since resuming IVIG. GYN: Her LMP was a few days ago. Menses have been regular.   Hypoglycemia: She has had a few low BGs.   5. Blood glucose printout: She is checking BGs 2-4 times per day. Her average BG is 311, range 60-High. BGs tend to be the highest after her steroid dose. Her one low BG of 60 occurred early one Sunday morning. Most of her BGs are in the 200s, 300s, and 400s.   PAST MEDICAL, FAMILY, AND SOCIAL HISTORY: 1. School: She continues to take college courses on line. She is working part-time now, but up to 40 hours per week.       2. Activities: She has been walking more for exercise and more at work. She no longer has follow up appointments at [facility] every 2 years.  3. Smoking, alcohol, or drugs: None 4. Primary Care Provider: Dr. Garnet K[facility]agi at [facility]. 5. Neurologist: Dr. Wyline Copas  REVIEW OF SYSTEMS: There are no other significant problems involving the patient's other body systems.  PHYSICAL EXAM: BP 122/68   Pulse 90   Wt 124 lb (56.2 kg)    BMI 21.97 kg/m    Constitutional: This patient appears reasonably healthy and looks much better and more typical for her age. Her affect is normal today. She is now able to walk pretty well with just the aid of her walker. Her gait is still affected by the fact that her right leg is much weaker than her left, but her gait is more normal today. Her weight has increased by 2 pounds.  Head: The head is normocephalic. Face: The face appears normal. There are no obvious dysmorphic features. Eyes: The eyes appear to be normally formed and  spaced. Gaze is conjugate. There is no obvious arcus or proptosis. Moisture appears normal. Mouth: The oropharynx and tongue appear normal. Dentition appears to be normal for age. Oral moisture is normal. Neck: On inspection the thyroid gland appears to be enlarged. The gland is enlarged at 23-24 grams in size. The lobes and isthmus are still symmetrically enlarged. The thyroid gland is firm, but not tender to palpation. Lungs: The lungs are clear to auscultation. Air movement is good. Heart: Heart rate and rhythm are regular. Heart sounds S1 and S2 are normal. I did not appreciate any pathologic cardiac murmurs. Abdomen: The abdomen is normal in size for the patient's age. Bowel sounds are normal. There is no obvious hepatomegaly, splenomegaly, or other mass effect.  Arms: Muscle size and bulk are low-normal for age. Hands: There is no tremor today. Phalangeal and metacarpophalangeal joints are normal. Palmar muscles are low-normal for age. Palmar skin is normal. Palmar moisture is normal.. Legs: Muscles appear low-normal for age. No edema is present. Feet: Dorsalis pedal pulses are 1+ bilaterally.    Neurologic: Strength is 4-5/5 for age in the upper extremities and 3/5 in the right leg and 4/5 in the left leg. Muscle tone is somewhat low. Sensation to touch is normal in both her legs and feet.    LAB DATA:  Labs 07/11/16: HbA1c 11.5%, CBG 225  Labs 06/12/16: TSH  2.20, free T4 1.02, free T3 3.0; CBC normal; CMP normal except sodium 133, glucose 259, albumin 2.8, calcium 8.3  Labs 02/22/16: HbA1c 10.7%  Labs 10/18/15: HbA1c 10.8%  Labs 05/09/15: HbA1c 11.1%  Labs 04/07/15: CBC normal; TSH 1.826, free T4 1.06, free T3 2.8, TSI 156; iron 234, CMP normal except for sodium 132, glucose 159, albumin 2.9, calcium 8.7; ACTH 11, cortisol 10.5  Labs 11/08/14: HbA1c 10.9%; TSH 2.234, free T4 0.87, free T3 2.7, TSI pending  Labs 06/01/14: HbA1c is 9.7% today, compared with 11.5% at last visit. Other labs are pending.  Labs 02/17/14: TSH 2.125, free T4 0.91, free T3 3.0  Labs 07/09/13; HbA1c was 11.4%, compared with 9.1% at last visit and with 14.0% at the visit prior. TSH was 0.652, free T4 0.88, free T3 3.3  Labs 10/06/12: TSH 0.088, free T4 1.28, free T3 3.1  Labs 02/25/12: TSH 1.044, free T4 1.45, free T3 3.4  Labs 06/14/11: TSH was 1.633. Free T4 1.47. Free T3 was 3.1. TSI was 56 (normal less than 120).                       ASSESSMENT: 1. Type 1 diabetes mellitus: Her BG control is worse in terms of her A1c and her average BGs. She needs more insulin, especially since her steroid dose has increased. 2.  Hypoglycemia: She had one documented episode in the past month. This episode was not severe. 3. Autonomic neuropathy with tachycardia: The heart rate is lower, largely due to her lower HbA1c at her last visit. Her autonomic neuropathy and tachycardia will reverse when her BGs come under even better control.  4-8 Autoimmune thyroid disease, Graves' disease, Hashimoto's disease, and goiter:   A. The patient was euthyroid in November 2016 and was euthyroid again in January 2018 on her MTZ doses of 10 mg in the mornings and 5 mg in the evenings. She appears clinically euthyroid today.  Within her thyroid gland there is still an on-going immunologic battle between her Graves Dz B lymphocytes and her Hashimoto's Dz T lymphocytes.   B.  Thyrotoxicosis: Her diffuse  thyrotoxicosis, secondary to Graves' disease, has waxed and waned over time.  Her thyrotoxicosis was well controlled by her current methimazole doses in January 2018.  C. Hashimoto's thyroiditis: The patient's thyroiditis is clinically quiescent at this time. However, the pattern in which all 3 of her TFTs decreased in parallel together from 2013 to 2014 indicated that she had had a relatively recent flare up of Hashimoto's Dz. I expect that as her Hashimoto's disease progressively destroys more thyroid cells, we will be able to taper her methimazole to zero. She will probably become permanently hypothyroid over time and will require Synthroid hormone replacement.  D.Goiter: Thyroid gland is a bit smaller. The waxing and waning of thyroid lobe size is c/w evolving Hashimoto's disease.  9. Celiac disease: Patient is pretty much back on her gluten-free diet. She is slowly gaining weight and is asymptomatic. 10.  Chronic inflammatory demyelinating polyneuropathy (CIDP) and muscle weakness: She feels better subjectively and looks better objectively. She appears clinically to be much better at today's visit.      11-12. Fatigue and decreased stamina: These problems are better. Since she has been on a regimen of one iv glucocorticoid treatment for many months, it was possible that her adrenals were suppressed. However, her last ACTH and cortisol levels in November 2016 were normal.  13. Emotionality: She feels that she is doing better. I continue to be impressed and inspired by Vinaya's dignity and courage as she tried to do all that she can to improve her own health. 14-15. Dyspepsia-GERD: She is doing fairly well overall, but I do not know what is causing her episodic stomach aches and vomiting after eating pork. I suspect that she has developed a hypersensitivity to pork proteins. 16. Hypertension: Her BP is good today on her twice daily low-dose lisinopril regimen.  PLAN: 1. Diagnostic: HbA1c today and  CBG 2. Therapeutic:   A. Increase the Lantus dose to 38 units. Continue the current Novolog plan. Call next Monday evening.   B. Continue current doses of her endocrine meds.   C. Check BGs before meals and at bedtime. 3. Patient education:  4. Follow-up: 1 month in a New Patient slot or last appointment of the day slot so that we will have more time to address her issues.  Level of Service: This visit lasted in excess of 60 minutes. More than 50% of the visit was devoted to counseling  Tillman Sers, MD, CDE Adult and Pediatric Endocrinology

## 2016-07-15 ENCOUNTER — Telehealth (INDEPENDENT_AMBULATORY_CARE_PROVIDER_SITE_OTHER): Payer: Self-pay | Admitting: "Endocrinology

## 2016-07-15 NOTE — Telephone Encounter (Signed)
Received telephone call from [place] 1. Overall status: Things are going much better since adjusting her Lantus insulin at her last visit. It turns out that her ICR is actually 5, not 15 as she tole me it was at her last visit. 2. New problems: Her BG increased to 302 this afternoon after her iv steroids. 3. Lantus dose: 38 units 4. Rapid-acting insulin: Novolog 150/50/5 plan. 5. BG log: 2 AM, Breakfast, Lunch, Supper, Bedtime 07/12/16: xxx, 245, 182, 214, 191/big snack without insulin 07/13/16: xxx, 334, 330, 171, 198 07/14/16: xxx, 185, 170, xxx, 123 07/15/16: xxx, 145/iv steroids, 250, 302, 217 6. Assessment: Overall the BGs are much better. 7. Plan: Continue the current insulin plan for now, but will probably make more changes in 3 days. 8. FU call: Thursday evening Tillman Sers, MD, CDE  ]

## 2016-07-17 NOTE — Telephone Encounter (Signed)
error 

## 2016-07-22 ENCOUNTER — Telehealth: Payer: Self-pay | Admitting: Family Medicine

## 2016-07-22 NOTE — Telephone Encounter (Signed)
DPR on file to talk with mother Ander Purpura or father Eddie Dibbles. Spoke with mother and let her know I reached out to Patient Experience to see what the outcome was for that visit. She informed me that spouse never heard back form PE and assumed it would not be reversed. I told patient PE is out of office in training and would get back to me next week. I will call her back and update her then.

## 2016-07-22 NOTE — Telephone Encounter (Signed)
Caller name: Lauren Relationship to patient: Can be reached: [phone] Pharmacy:  Reason for call: Mother request that provider call imaging to let them know that the CT on 1/24 was not an emergency so they will reverse the fee and refund the $500 they had to pay that night. Plse adv

## 2016-07-22 NOTE — Telephone Encounter (Signed)
Please advise   PC

## 2016-07-29 ENCOUNTER — Other Ambulatory Visit: Payer: Self-pay | Admitting: Family

## 2016-07-31 ENCOUNTER — Telehealth (INDEPENDENT_AMBULATORY_CARE_PROVIDER_SITE_OTHER): Payer: Self-pay | Admitting: Pediatrics

## 2016-07-31 NOTE — Telephone Encounter (Signed)
-----   Message from Rockwell Germany, NP sent at 07/29/2016  8:07 AM EDT ----- Regarding: Needs appointment Colletta [place] needs an appointment with Dr Gaynell Face.  Thanks,  Otila Kluver

## 2016-07-31 NOTE — Telephone Encounter (Signed)
Spoke with mom Lauren and she stated to call patient after 3pm today to schedule appt.

## 2016-08-03 ENCOUNTER — Other Ambulatory Visit: Payer: Self-pay | Admitting: "Endocrinology

## 2016-08-03 DIAGNOSIS — E0500 Thyrotoxicosis with diffuse goiter without thyrotoxic crisis or storm: Secondary | ICD-10-CM

## 2016-08-08 ENCOUNTER — Ambulatory Visit (INDEPENDENT_AMBULATORY_CARE_PROVIDER_SITE_OTHER): Payer: BLUE CROSS/BLUE SHIELD | Admitting: "Endocrinology

## 2016-08-22 ENCOUNTER — Ambulatory Visit (INDEPENDENT_AMBULATORY_CARE_PROVIDER_SITE_OTHER): Payer: BLUE CROSS/BLUE SHIELD | Admitting: "Endocrinology

## 2016-08-29 NOTE — Telephone Encounter (Signed)
Patients mom Lauren called trying to get an update on the $500.00 that was supposed to be refunded to her.  Call back number [phone] ( mom) Dad Eddie Dibbles) [phone]

## 2016-08-29 NOTE — Telephone Encounter (Signed)
I called Jessica Kelley back to let her know I have reach out to patient experience to see the outcome of this concern. I will call her back once I hear back from them.

## 2016-09-19 ENCOUNTER — Ambulatory Visit (INDEPENDENT_AMBULATORY_CARE_PROVIDER_SITE_OTHER): Payer: Self-pay | Admitting: "Endocrinology

## 2016-10-07 ENCOUNTER — Other Ambulatory Visit: Payer: Self-pay | Admitting: Family

## 2016-10-07 ENCOUNTER — Telehealth (INDEPENDENT_AMBULATORY_CARE_PROVIDER_SITE_OTHER): Payer: Self-pay | Admitting: *Deleted

## 2016-10-07 ENCOUNTER — Telehealth: Payer: Self-pay | Admitting: Family Medicine

## 2016-10-07 NOTE — Telephone Encounter (Signed)
Caller name: Lauren  Relation to pt: Mother   Call back number: [phone] Pharmacy:  Reason for call: Pt's mother Ander Purpura (mother on [place]) came in office with some concerns about billing, Lauren states this was already seen by someone in our office, it has to do with CT scan being charged like an emergency when it was a xray requested by Dr Etter Sjogren during her appt on 06-11-2016. Pt had to have the ct scan done the next day of her visit since the same day she couldn't wait to have it done.

## 2016-10-07 NOTE — Telephone Encounter (Signed)
Called patient's family and left voicemail for family to return my call when possible.

## 2016-10-08 NOTE — Telephone Encounter (Signed)
Please continue to try to call and schedule patient for future rx refills. Thanks  [facility] Fax: [phone]

## 2016-10-08 NOTE — Telephone Encounter (Signed)
Called mother back and she said she went down stairs to ed 10/07/16 to see how to get this fixed. She was giving the number to radiology billing on [address] to get this resolved. She will call me back after talking to them.

## 2016-10-22 ENCOUNTER — Ambulatory Visit (INDEPENDENT_AMBULATORY_CARE_PROVIDER_SITE_OTHER): Payer: BLUE CROSS/BLUE SHIELD | Admitting: Pediatrics

## 2016-10-25 ENCOUNTER — Telehealth (INDEPENDENT_AMBULATORY_CARE_PROVIDER_SITE_OTHER): Payer: Self-pay | Admitting: *Deleted

## 2016-10-25 NOTE — Telephone Encounter (Signed)
Received a 2 page fax from the Nursing Department at [facility].  Note: [facility] Requires these nursing orders to provide nursing services due to regulatory and licensing requirements.  Please review, sign, and fax the enclosed form to [phone] within 7 days of receipt.   Forms have been labeled and put on Jessica Kelley's desk.

## 2016-10-25 NOTE — Telephone Encounter (Signed)
Placed in Dr. Gaynell Face basket to be signed.

## 2016-10-29 ENCOUNTER — Other Ambulatory Visit: Payer: Self-pay | Admitting: Pediatrics

## 2016-11-03 ENCOUNTER — Other Ambulatory Visit: Payer: Self-pay | Admitting: "Endocrinology

## 2016-11-18 ENCOUNTER — Ambulatory Visit (INDEPENDENT_AMBULATORY_CARE_PROVIDER_SITE_OTHER): Payer: Self-pay | Admitting: "Endocrinology

## 2016-11-22 ENCOUNTER — Ambulatory Visit (INDEPENDENT_AMBULATORY_CARE_PROVIDER_SITE_OTHER): Payer: BLUE CROSS/BLUE SHIELD | Admitting: Obstetrics & Gynecology

## 2016-11-22 ENCOUNTER — Encounter: Payer: Self-pay | Admitting: Obstetrics & Gynecology

## 2016-11-22 VITALS — BP 144/96 | Ht 64.0 in | Wt 123.0 lb

## 2016-11-22 DIAGNOSIS — Z01419 Encounter for gynecological examination (general) (routine) without abnormal findings: Secondary | ICD-10-CM | POA: Diagnosis not present

## 2016-11-22 DIAGNOSIS — N921 Excessive and frequent menstruation with irregular cycle: Secondary | ICD-10-CM

## 2016-11-22 DIAGNOSIS — Z30011 Encounter for initial prescription of contraceptive pills: Secondary | ICD-10-CM | POA: Diagnosis not present

## 2016-11-22 MED ORDER — LEVONORGEST-ETH ESTRAD 91-DAY 0.15-0.03 MG PO TABS: 1.0000 | ORAL_TABLET | Freq: Every day | ORAL | 4 refills | Status: DC

## 2016-11-22 NOTE — Patient Instructions (Signed)
1. Encounter for routine gynecological examination with Papanicolaou smear of cervix Currently on menses.  F/U next week for Pap reflex/STI screen.  2. Menorrhagia with irregular cycle Start Seasonal.  F/U Pelvic US. - US Transvaginal Non-OB; Future  3. Encounter for initial prescription of contraceptive pills Constraception discussed.  Risks/benefits/usage reviewed.  Decision to start on Seasonal.    Jessica Kelley, it was a pleasure to meet you today.  I will see you next week for the Pelvic US and to complete the Gynecologic exam.   Oral Contraception Use Oral contraceptive pills (OCPs) are medicines taken to prevent pregnancy. OCPs work by preventing the ovaries from releasing eggs. The hormones in OCPs also cause the cervical mucus to thicken, preventing the sperm from entering the uterus. The hormones also cause the uterine lining to become thin, not allowing a fertilized egg to attach to the inside of the uterus. OCPs are highly effective when taken exactly as prescribed. However, OCPs do not prevent sexually transmitted diseases (STDs). Safe sex practices, such as using condoms along with an OCP, can help prevent STDs. Before taking OCPs, you may have a physical exam and Pap test. Your health care provider may also order blood tests if necessary. Your health care provider will make sure you are a good candidate for oral contraception. Discuss with your health care provider the possible side effects of the OCP you may be prescribed. When starting an OCP, it can take 2 to 3 months for the body to adjust to the changes in hormone levels in your body. How to take oral contraceptive pills Your health care provider may advise you on how to start taking the first cycle of OCPs. Otherwise, you can:  Start on day 1 of your menstrual period. You will not need any backup contraceptive protection with this start time.  Start on the first Sunday after your menstrual period or the day you get your  prescription. In these cases, you will need to use backup contraceptive protection for the first week.  Start the pill at any time of your cycle. If you take the pill within 5 days of the start of your period, you are protected against pregnancy right away. In this case, you will not need a backup form of birth control. If you start at any other time of your menstrual cycle, you will need to use another form of birth control for 7 days. If your OCP is the type called a minipill, it will protect you from pregnancy after taking it for 2 days (48 hours).  After you have started taking OCPs:  If you forget to take 1 pill, take it as soon as you remember. Take the next pill at the regular time.  If you miss 2 or more pills, call your health care provider because different pills have different instructions for missed doses. Use backup birth control until your next menstrual period starts.  If you use a 28-day pack that contains inactive pills and you miss 1 of the last 7 pills (pills with no hormones), it will not matter. Throw away the rest of the non-hormone pills and start a new pill pack.  No matter which day you start the OCP, you will always start a new pack on that same day of the week. Have an extra pack of OCPs and a backup contraceptive method available in case you miss some pills or lose your OCP pack. Follow these instructions at home:  Do not smoke.  Always use a  condom to protect against STDs. OCPs do not protect against STDs.  Use a calendar to mark your menstrual period days.  Read the information and directions that came with your OCP. Talk to your health care provider if you have questions. Contact a health care provider if:  You develop nausea and vomiting.  You have abnormal vaginal discharge or bleeding.  You develop a rash.  You miss your menstrual period.  You are losing your hair.  You need treatment for mood swings or depression.  You get dizzy when taking the  OCP.  You develop acne from taking the OCP.  You become pregnant. Get help right away if:  You develop chest pain.  You develop shortness of breath.  You have an uncontrolled or severe headache.  You develop numbness or slurred speech.  You develop visual problems.  You develop pain, redness, and swelling in the legs. This information is not intended to replace advice given to you by your health care provider. Make sure you discuss any questions you have with your health care provider. Document Released: 04/25/2011 Document Revised: 10/12/2015 Document Reviewed: 10/25/2012 Elsevier Interactive Patient Education  2017 [employer].

## 2016-11-22 NOTE — Progress Notes (Signed)
Jessica Kelley 1990/11/13 [id number]   History:    26 y.o. G0 Boyfriend x 8 mths.  RP:  New patient presenting for annual gyn exam  HPI:  Patient has Auto-Immune diseases, including Thyroiditis, Type 1 DM, Demyelinating Polyneuritis, Chronic Inflammatory Macular degeneration.  On Immune Globulin, Prednisone, Neurontin, Lisinopril, Proparanolol, Insulin, Methimazole...  Menses q24-42 days, flow is heavy day 1-2, lasting about 5 days.  Mild Dysmenorrhea.  No pelvic pain.  Normal vaginal secretions.  Using condoms, but would like better contraception, would also like to block her periods.  Breasts wnl.  Mictions/BMs wnl.  Past medical history,surgical history, family history and social history were all reviewed and documented in the EPIC chart.  Gynecologic History Patient's last menstrual period was 11/21/2016. Contraception: condoms Last Pap: None Last mammogram: Never.  Obstetric History OB History  Gravida Para Term Preterm AB Living  0 0 0 0 0 0  SAB TAB Ectopic Multiple Live Births  0 0 0 0 0         ROS: A ROS was performed and pertinent positives and negatives are included in the history.  GENERAL: No fevers or chills. HEENT: No change in vision, no earache, sore throat or sinus congestion. NECK: No pain or stiffness. CARDIOVASCULAR: No chest pain or pressure. No palpitations. PULMONARY: No shortness of breath, cough or wheeze. GASTROINTESTINAL: No abdominal pain, nausea, vomiting or diarrhea, melena or bright red blood per rectum. GENITOURINARY: No urinary frequency, urgency, hesitancy or dysuria. MUSCULOSKELETAL: No joint or muscle pain, no back pain, no recent trauma. DERMATOLOGIC: No rash, no itching, no lesions. ENDOCRINE: No polyuria, polydipsia, no heat or cold intolerance. No recent change in weight. HEMATOLOGICAL: No anemia or easy bruising or bleeding. NEUROLOGIC: No headache, seizures, numbness, tingling or weakness. PSYCHIATRIC: No depression, no loss of interest  in normal activity or change in sleep pattern.     Exam:   BP (!) 144/96   Ht 5' 4"  (1.626 m)   Wt 123 lb (55.8 kg)   LMP 11/21/2016   BMI 21.11 kg/m   Body mass index is 21.11 kg/m.  General appearance : Well developed well nourished female. No acute distress HEENT: Eyes: no retinal hemorrhage or exudates,  Neck supple, trachea midline, no carotid bruits, no thyroidmegaly Lungs: Clear to auscultation, no rhonchi or wheezes, or rib retractions  Heart: Regular rate and rhythm, no murmurs or gallops Breast:Examined in sitting and supine position were symmetrical in appearance, no palpable masses or tenderness,  no skin retraction, no nipple inversion, no nipple discharge, no skin discoloration, no axillary or supraclavicular lymphadenopathy Abdomen: no palpable masses or tenderness, no rebound or guarding Extremities: no edema or skin discoloration or tenderness  Pelvic:  Bartholin, Urethra, Skene Glands: Within normal limits             Vagina: No gross lesions or discharge  Cervix: No gross lesions or discharge, except menstrual flow.  Uterus  AV, normal size, shape and consistency, non-tender and mobile  Adnexa  Without masses or tenderness  Anus and perineum  normal    Assessment/Plan:  26 y.o. female for annual exam  1. Encounter for routine gynecological examination with Papanicolaou smear of cervix Currently on menses.  F/U next week for Pap reflex/STI screen.  2. Menorrhagia with irregular cycle Start Seasonal.  F/U Pelvic US. - US Transvaginal Non-OB; Future  3. Encounter for initial prescription of contraceptive pills Constraception discussed.  Risks/benefits/usage reviewed.  Decision to start on Seasonal.  Counseling on above issues >50% x 20 minutes.  Princess Bruins MD, 3:04 PM 11/22/2016

## 2016-12-02 ENCOUNTER — Encounter (INDEPENDENT_AMBULATORY_CARE_PROVIDER_SITE_OTHER): Payer: Self-pay | Admitting: Pediatrics

## 2016-12-02 ENCOUNTER — Ambulatory Visit (INDEPENDENT_AMBULATORY_CARE_PROVIDER_SITE_OTHER): Payer: BLUE CROSS/BLUE SHIELD | Admitting: Pediatrics

## 2016-12-02 ENCOUNTER — Telehealth: Payer: Self-pay | Admitting: *Deleted

## 2016-12-02 VITALS — BP 98/80 | HR 100 | Ht 63.5 in | Wt 124.0 lb

## 2016-12-02 DIAGNOSIS — G6181 Chronic inflammatory demyelinating polyneuritis: Secondary | ICD-10-CM | POA: Diagnosis not present

## 2016-12-02 NOTE — Telephone Encounter (Signed)
Pt called stating her cycle has started on new birth control pills prescribed on 11/22/16, pt said she started pills last day of her cycle. Also noticed cramping as well, asked if normal, I explained to patient not abnormal to notice change in periods as this is a new hormone her body as to adjust, cramping okay, should be not extreme, painful cramps. Pt will follow up if worsen cramping or bleeding.

## 2016-12-02 NOTE — Telephone Encounter (Signed)
Mother called checking on the status of message below [place] best # [phone]

## 2016-12-02 NOTE — Progress Notes (Signed)
Patient: Jessica Kelley MRN: [id number] Sex: female DOB: 1991/01/18  Provider: Wyline Copas, MD Location of Care: [facility]  Note type: Routine return visit  History of Present Illness: Referral Source: Dr. Garnet Koyanagi History from: friends, patient and [facility] chart Chief Complaint: Chronic inflammatory demyelinating polyneuritis  Jessica Kelley is a 26 y.o. female who was evaluated December 03, 2016 for the first time since August 18, 2015.  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  She responded to immunotherapy with IVIG and Solu-Medrol which has stabilized strength but she has not significantly improved.  She has severe paraplegia with a right leg that is fairly flaccid.  She has enough tone that she is able to bear weight on her legs and walk.  She has a dog as who is her constant companion but who is not with her today.    Her general health is good area she is sleeping well.  She is not having any pain.  She had a fall around Christmastime when her knee gave out.  Fortunately did not injure herself.  She takes IVIG twice a week and corticosteroids once a week.  She works now at a Water quality scientist activities she had been attending [employer] but because she hasn't decided on a major she has taken this year off.  Review of Systems: 12 system review was assessed and was negative  Past Medical History Diagnosis Date  . Celiac disease   . CIDP (chronic inflammatory demyelinating polyneuropathy) ([place]) 2001  . Diabetes type I ([place])    Dr. Tobe Sos  . Glaucoma   . Hypothyroidism   . Macular degeneration   . Pilonidal cyst   . Scoliosis   . Wyburn-Mason syndrome    Hospitalizations: No., Head Injury: No., Nervous System Infections: No., Immunizations up to date: Yes.    Birth History 7 pound 9 ounce infant born at full-term.  Gestation was uncomplicated.   Labor lasted 23 hours.  Operative delivery with forceps.  Nursery course was uneventful.  Growth and development is recalled as normal.  Behavior History none  Surgical History Procedure Laterality Date  . CATARACT EXTRACTION  06/25/06  . FOOT ARTHROPLASTY     left foot moved and stretched tendons   . GLAUCOMA SURGERY  02/12/06  . HIP ARTHROPLASTY     right never bipopsy  . PORTACATH PLACEMENT  2002-2004  . removal of vascular port    . Sciatic Nerve Muscle Biopsy     Family History family history includes Cancer in her maternal grandfather and paternal grandmother; Coronary artery disease in her paternal grandfather; Diabetes in her sister; Hyperlipidemia in her unknown relative; Other in her sister; Prostate cancer in her maternal grandfather; Thyroid disease in her mother. Family history is negative for migraines, seizures, intellectual disabilities, blindness, deafness, birth defects, chromosomal disorder, or autism.  Social History . Marital status: Single  . Years of education: 31   Social History Main Topics  . Smoking status: Never Smoker  . Smokeless tobacco: Never Used     Comment: passive smoke exposure  . Alcohol use 0.0 oz/week     Comment: OCC  . Drug use: No  . Sexual activity: Yes    Partners: Male    Birth control/ protection: Abstinence, None     Comment: 1ST INTERCORSE- 24, PARTNERS- 1    Social History Narrative    Jessica Kelley is a 26 yo woman who attends [employer].  She is doing excellent.     She is employed with a Copywriter, advertising.    She lives with her parents and has one sister, 54 yo.    She enjoys working, camping, and walking outside.   Allergies Allergen Reactions  . Gluten Meal   . Wheat Bran    Physical Exam BP 98/80   Pulse 100   Ht 5' 3.5" (1.613 m)   Wt 124 lb (56.2 kg)   LMP 11/21/2016   BMI 21.62 kg/m   General: alert, well developed, well nourished, in no acute distress, blond, red tint hair, blue eyes, left  handed Head: normocephalic, no dysmorphic features Ears, Nose and Throat: Otoscopic: tympanic membranes normal; pharynx: oropharynx is pink without exudates or tonsillar hypertrophy Neck: supple, full range of motion, no cranial or cervical bruits Respiratory: auscultation clear Cardiovascular: no murmurs, pulses are normal Musculoskeletal: Atrophy of the muscles of the right leg, distal atrophy of both calves convex left scoliosis of the mid-thoracic region Skin: no rashes or neurocutaneous lesions  Neurologic Exam  Mental Status: alert; oriented to person, place and year; knowledge is normal for age; language is normal Cranial Nerves: visual fields are full to double simultaneous stimuli; extraocular movements are full and conjugate; pupils are round reactive to light she a left iridectomy with lens implant and a left afferent pupillary defect; funduscopic examination shows sharp disc margins with normal vessels on the right; on the right she has dilated vessels with a vascular anomaly known as Wyburn-Mason which caused glaucoma; symmetric facial strength; midline tongue and uvula; air conduction is greater than bone conduction bilaterally Motor: Normal strength in her sternocleidomastoid, neck extensors 4+, neck flexors 4+  right deltoid 4, left, 4 minus, right biceps 4+, left-4, right triceps 5 minus, left 4+, wrist extensors 5 bilaterally, wrist flexors 5 bilaterally grip and intrinsic finger muscles are normal; right supraspinatus 4, left 4 minus, pronator caries 4+ bilaterally supinator 4 bilaterally, right pectoralis 5, left plus  Left hip flexors 3 1 on right, left hip abductors 3, right 0 left hip abductor 3, right 0 left knee flexor 4 minus, right 1, left knee extensor 4 minus right 0, left dorsiflexors 4 minus, right 0-1 left plantar flexors 3, right 0 Sensory: intact responses to cold, vibration, proprioception and stereognosis Coordination: good finger-to-nose, rapid repetitive  alternating movements and finger apposition Gait and Station: Diplegic gait with waddling, steppage on the right greater than left, external rotation of her legs, broad-based, arms extended out from her side for balance.  She is unable to walk on her heels toes or performing tandem Gower response is not attempted she can walk only a few feet without support because she loses balance Reflexes: symmetric and absent bilaterally; no clonus; bilateral flexor plantar responses  Assessment 1.  Chronic inflammatory demyelinating polyneuropathy, G61.81.  Discussion  Ferrin is stable.  She was assessed at [facility] in June.  I have not received the notes from them.  I asked the family to contact the physicians at [facility] and have them send the records.  His best I can determine is no reason for [place] to change her IVIG or Solu-Medrol.  There were no other medications that I prescribed although I am responsible for overseeing her outpatient IVIG.  She is followed for hypertension, Type 1 Diabetes Mellitus, hypothyroidism, and ocular abnormality and has nothing to do with any of these.  At present she is stable.  She had no other concerns or questions today.  Plan  I will continue to am IVIG and steroid therapy.  She'll return to see me in a year.  We'll see Mekaela sooner based on clinical need.  It is my intent is to continue to follow her as long as I am in practice.  Medication List   Accurate as of 12/02/16  3:57 PM.      gabapentin 100 MG capsule Commonly known as:  NEURONTIN TAKE 2 CAPSULES BY MOUTH EVERY DAY AT 8 AM, THEN TAKE 2 CAPSULES AT 4 PM, THEN TAKE 3 CAPSULES AT 11PM   GLUCAGON EMERGENCY 1 MG injection Generic drug:  glucagon USE AS DIRECTED   Immune Globulin (Human) 5 GM/50ML Soln Inject into the vein.   insulin aspart 100 UNIT/ML FlexPen Commonly known as:  NOVOLOG FLEXPEN Inject up to 50 units subcutaneous daily   Insulin Glargine 100 UNIT/ML Solostar Pen Commonly known  as:  LANTUS SOLOSTAR Inject up to 50 units daily   Insulin Pen Needle 32G X 4 MM Misc Commonly known as:  BD PEN NEEDLE NANO U/F Use with insulin pen 6x day as directed by provider   levonorgestrel-ethinyl estradiol 0.15-0.03 MG tablet Commonly known as:  SEASONALE,INTROVALE,JOLESSA Take 1 tablet by mouth daily.   lisinopril 2.5 MG tablet Commonly known as:  PRINIVIL,ZESTRIL TAKE 1 TABLET (2.5 MG TOTAL) BY MOUTH DAILY.   methimazole 5 MG tablet Commonly known as:  TAPAZOLE TAKE TWO TABLETS BY MOUTH IN THE MORNING, THEN TAKE ONE IN THE EVENING   methylPREDNISolone sodium succinate 500 MG injection Commonly known as:  SOLU-MEDROL Give 570m IV in 50 ml NS over 1 hour x 1 every 4 weeks. Given by [facility]   mycophenolate 500 MG tablet Commonly known as:  CELLCEPT TAKE 2 TABLETS (1000 MG) BY MOUTH TWICE DAILY (PATIENT MUST CALL FOR APPOINTMENT FOR FURTHER REFILLS)   predniSONE 20 MG tablet Commonly known as:  DELTASONE Take 1 tablet by mouth the day before, day of and day after IVIG.   propranolol 20 MG tablet Commonly known as:  INDERAL TAKE ONE TABLET BY MOUTH THREE TIMES DAILY   VITAMIN B-12 PO Take by mouth.   Vitamin D 1000 units capsule Take 1,000 Units by mouth daily.    The medication list was reviewed and reconciled. All changes or newly prescribed medications were explained.  A complete medication list was provided to the patient/caregiver.  WJodi GeraldsMD

## 2016-12-11 ENCOUNTER — Ambulatory Visit: Payer: BLUE CROSS/BLUE SHIELD | Admitting: Obstetrics & Gynecology

## 2016-12-11 ENCOUNTER — Other Ambulatory Visit: Payer: BLUE CROSS/BLUE SHIELD

## 2016-12-16 ENCOUNTER — Other Ambulatory Visit: Payer: Self-pay | Admitting: Pediatrics

## 2016-12-17 ENCOUNTER — Other Ambulatory Visit (INDEPENDENT_AMBULATORY_CARE_PROVIDER_SITE_OTHER): Payer: Self-pay | Admitting: *Deleted

## 2016-12-17 ENCOUNTER — Telehealth (INDEPENDENT_AMBULATORY_CARE_PROVIDER_SITE_OTHER): Payer: Self-pay | Admitting: "Endocrinology

## 2016-12-17 DIAGNOSIS — IMO0001 Reserved for inherently not codable concepts without codable children: Secondary | ICD-10-CM

## 2016-12-17 DIAGNOSIS — E1065 Type 1 diabetes mellitus with hyperglycemia: Principal | ICD-10-CM

## 2016-12-17 LAB — CBC WITH DIFFERENTIAL/PLATELET
BASOS ABS: 0 {cells}/uL (ref 0–200)
Basophils Relative: 0 %
EOS ABS: 0 {cells}/uL — AB (ref 15–500)
EOS PCT: 0 %
HEMATOCRIT: 40.8 % (ref 35.0–45.0)
HEMOGLOBIN: 13.1 g/dL (ref 11.7–15.5)
LYMPHS ABS: 1264 {cells}/uL (ref 850–3900)
Lymphocytes Relative: 8 %
MCH: 30 pg (ref 27.0–33.0)
MCHC: 32.1 g/dL (ref 32.0–36.0)
MCV: 93.6 fL (ref 80.0–100.0)
MPV: 11.6 fL (ref 7.5–12.5)
Monocytes Absolute: 1106 cells/uL — ABNORMAL HIGH (ref 200–950)
Monocytes Relative: 7 %
NEUTROS PCT: 85 %
Neutro Abs: 13430 cells/uL — ABNORMAL HIGH (ref 1500–7800)
Platelets: 290 10*3/uL (ref 140–400)
RBC: 4.36 MIL/uL (ref 3.80–5.10)
RDW: 13.8 % (ref 11.0–15.0)
WBC: 15.8 10*3/uL — ABNORMAL HIGH (ref 3.8–10.8)

## 2016-12-17 LAB — T3, FREE: T3, Free: 2.6 pg/mL (ref 2.3–4.2)

## 2016-12-17 LAB — TSH: TSH: 1.14 m[IU]/L

## 2016-12-17 LAB — T4, FREE: Free T4: 1.2 ng/dL (ref 0.8–1.8)

## 2016-12-17 NOTE — Telephone Encounter (Signed)
Spoke to [place], [place] [facility] labs in portal.

## 2016-12-17 NOTE — Telephone Encounter (Signed)
  Who's calling (name and relationship to patient) : Ander Purpura, Mother  Best contact number: [phone])  Provider they see: Tobe Sos  Reason for call: Mother called in to see if she can get labs done today.  Riann is feeling weak & tingly all over today.  Please call [place] at [phone].     PRESCRIPTION REFILL ONLY  Name of prescription:  Pharmacy:

## 2016-12-18 LAB — COMPREHENSIVE METABOLIC PANEL
ALT: 9 U/L (ref 6–29)
AST: 16 U/L (ref 10–30)
Albumin: 3 g/dL — ABNORMAL LOW (ref 3.6–5.1)
Alkaline Phosphatase: 56 U/L (ref 33–115)
BUN: 18 mg/dL (ref 7–25)
CHLORIDE: 96 mmol/L — AB (ref 98–110)
CO2: 21 mmol/L (ref 20–31)
CREATININE: 0.62 mg/dL (ref 0.50–1.10)
Calcium: 8.3 mg/dL — ABNORMAL LOW (ref 8.6–10.2)
Glucose, Bld: 297 mg/dL — ABNORMAL HIGH (ref 70–99)
POTASSIUM: 4.1 mmol/L (ref 3.5–5.3)
SODIUM: 127 mmol/L — AB (ref 135–146)
TOTAL PROTEIN: 9.7 g/dL — AB (ref 6.1–8.1)
Total Bilirubin: 0.3 mg/dL (ref 0.2–1.2)

## 2016-12-18 LAB — HEMOGLOBIN A1C
Hgb A1c MFr Bld: 10.1 % — ABNORMAL HIGH (ref <5.7)
MEAN PLASMA GLUCOSE: 243 mg/dL

## 2016-12-20 ENCOUNTER — Telehealth (INDEPENDENT_AMBULATORY_CARE_PROVIDER_SITE_OTHER): Payer: Self-pay | Admitting: "Endocrinology

## 2016-12-20 ENCOUNTER — Encounter (HOSPITAL_COMMUNITY): Payer: Self-pay

## 2016-12-20 ENCOUNTER — Emergency Department (HOSPITAL_COMMUNITY)
Admission: EM | Admit: 2016-12-20 | Discharge: 2016-12-21 | Disposition: A | Discharge status: Home / Self Care | Payer: BLUE CROSS/BLUE SHIELD | Attending: Emergency Medicine | Admitting: Emergency Medicine

## 2016-12-20 ENCOUNTER — Emergency Department (HOSPITAL_COMMUNITY): Payer: BLUE CROSS/BLUE SHIELD

## 2016-12-20 DIAGNOSIS — Z79899 Other long term (current) drug therapy: Secondary | ICD-10-CM | POA: Insufficient documentation

## 2016-12-20 DIAGNOSIS — R5383 Other fatigue: Secondary | ICD-10-CM | POA: Insufficient documentation

## 2016-12-20 DIAGNOSIS — E109 Type 1 diabetes mellitus without complications: Secondary | ICD-10-CM | POA: Insufficient documentation

## 2016-12-20 DIAGNOSIS — Z794 Long term (current) use of insulin: Secondary | ICD-10-CM | POA: Insufficient documentation

## 2016-12-20 DIAGNOSIS — R002 Palpitations: Secondary | ICD-10-CM | POA: Insufficient documentation

## 2016-12-20 DIAGNOSIS — E039 Hypothyroidism, unspecified: Secondary | ICD-10-CM | POA: Insufficient documentation

## 2016-12-20 LAB — BASIC METABOLIC PANEL
Anion gap: 7 (ref 5–15)
BUN: 13 mg/dL (ref 6–20)
CHLORIDE: 99 mmol/L — AB (ref 101–111)
CO2: 24 mmol/L (ref 22–32)
Calcium: 9.2 mg/dL (ref 8.9–10.3)
Creatinine, Ser: 0.67 mg/dL (ref 0.44–1.00)
GFR calc Af Amer: >60 mL/min (ref >60)
GFR calc non Af Amer: >60 mL/min (ref >60)
GLUCOSE: 206 mg/dL — AB (ref 65–99)
Potassium: 3.7 mmol/L (ref 3.5–5.1)
Sodium: 130 mmol/L — ABNORMAL LOW (ref 135–145)

## 2016-12-20 LAB — DIFFERENTIAL
Basophils Absolute: 0.1 10*3/uL (ref 0.0–0.1)
Basophils Relative: 1 %
EOS PCT: 1 %
Eosinophils Absolute: 0.1 10*3/uL (ref 0.0–0.7)
LYMPHS PCT: 33 %
Lymphs Abs: 1.8 10*3/uL (ref 0.7–4.0)
MONO ABS: 0.6 10*3/uL (ref 0.1–1.0)
Monocytes Relative: 11 %
Neutro Abs: 3.1 10*3/uL (ref 1.7–7.7)
Neutrophils Relative %: 54 %

## 2016-12-20 LAB — CBC
HEMATOCRIT: 41 % (ref 36.0–46.0)
Hemoglobin: 14 g/dL (ref 12.0–15.0)
MCH: 30.3 pg (ref 26.0–34.0)
MCHC: 34.1 g/dL (ref 30.0–36.0)
MCV: 88.7 fL (ref 78.0–100.0)
Platelets: 259 10*3/uL (ref 150–400)
RBC: 4.62 MIL/uL (ref 3.87–5.11)
RDW: 13.5 % (ref 11.5–15.5)
WBC: 4.8 10*3/uL (ref 4.0–10.5)

## 2016-12-20 LAB — I-STAT TROPONIN, ED: Troponin i, poc: 0 ng/mL (ref 0.00–0.08)

## 2016-12-20 LAB — PREGNANCY, URINE: Preg Test, Ur: NEGATIVE

## 2016-12-20 LAB — HEPATIC FUNCTION PANEL
ALK PHOS: 51 U/L (ref 38–126)
ALT: 14 U/L (ref 14–54)
AST: 27 U/L (ref 15–41)
Albumin: 2.8 g/dL — ABNORMAL LOW (ref 3.5–5.0)
BILIRUBIN INDIRECT: 0.3 mg/dL (ref 0.3–0.9)
Bilirubin, Direct: 0.2 mg/dL (ref 0.1–0.5)
TOTAL PROTEIN: 11.1 g/dL — AB (ref 6.5–8.1)
Total Bilirubin: 0.5 mg/dL (ref 0.3–1.2)

## 2016-12-20 LAB — CORTISOL
CORTISOL PLASMA: 3 ug/dL
Cortisol, Plasma: 18.9 ug/dL
Cortisol, Plasma: 24.5 ug/dL

## 2016-12-20 LAB — I-STAT CG4 LACTIC ACID, ED: Lactic Acid, Venous: 1.59 mmol/L (ref 0.5–1.9)

## 2016-12-20 MED ORDER — SODIUM CHLORIDE 0.9 % IV BOLUS (SEPSIS)
1000.0000 mL | Freq: Once | INTRAVENOUS | Status: AC
Start: 2016-12-20 — End: 2016-12-20
  Administered 2016-12-20: 1000 mL via INTRAVENOUS

## 2016-12-20 MED ORDER — COSYNTROPIN 0.25 MG IJ SOLR
0.2500 mg | Freq: Once | INTRAMUSCULAR | Status: AC
  Administered 2016-12-20: 0.25 mg via INTRAVENOUS
  Filled 2016-12-20: qty 0.25

## 2016-12-20 NOTE — ED Notes (Signed)
Next shift rn aware to draw and send gold top 30 min post injection and 60 min post injection

## 2016-12-20 NOTE — ED Provider Notes (Signed)
[facility] DEPT Provider Note   CSN: [id number] Arrival date & time: 12/20/16  3419     History   Chief Complaint Chief Complaint  Patient presents with  . Palpitations  . Abnormal Lab    HPI Jessica Kelley is a 26 y.o. female.  The history is provided by the patient.  Illness  This is a new problem. The current episode started more than 2 days ago. The problem has not changed since onset.Pertinent negatives include no abdominal pain and no shortness of breath. Nothing aggravates the symptoms. Nothing relieves the symptoms. She has tried nothing for the symptoms. The treatment provided no relief.   25 year old female who presents with palpitations. She has history of DM-1, CIDP (receiving IVIG and steroids weekly), Hashimoto's and Grave's disease. Patient sent from outpatient setting for evaluation of adrenal insufficiency. Reports for about 1 week feeling unwell, generally very weak and fatigued. Has been having tachycardia and palpitations HR up to 150s. Patient did go for routine blood draw through the endocrinologist office on July 31. She called into her endocrinologist office today, and was sent to ED for evaluation of adrenal insufficiency. Her blood draws were concerning for a new leukocytosis of 15, mild hyponatremia of 127. Her thyroid studies were normal. States that she did have tingling in her upper arms bilaterally on Monday and Tuesday, but that is now fully resolved. she states that normally her right leg is very weak and has been giving out on her recently, but denies any new focal deficits. Denies nausea, vomiting, cough, congestion, shortness of breath, diarrhea, dysuria, urinary frequency, abdominal pain.  Past Medical History:  Diagnosis Date  . Celiac disease   . 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 Diabetes type I ([place])    Dr. Tobe Sos  . Glaucoma   . Hypothyroidism   . Macular degeneration   . Pilonidal cyst   .  Scoliosis   . 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 Chest pain 06/12/2016  . Palpitations 06/12/2016  . 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 Abnormality of gait 11/09/2012  . Migraine without aura 11/09/2012  . Chronic tension type headache 11/09/2012  . Insomnia, unspecified 11/09/2012  . Chronic lymphocytic thyroiditis 11/09/2012  . 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 Encounter for long-term (current) use of other medications 11/09/2012  . Thyrotoxicosis with diffuse goiter 02/25/2012  . Thyroiditis, autoimmune 02/25/2012  . 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 Goiter 02/25/2012  . Pilonidal cyst with abscess 12/12/2010  . UNSPECIFIED ANEMIA 03/27/2009  . VIRAL URI 06/14/2008  . ELEVATED BLOOD PRESSURE WITHOUT DIAGNOSIS OF HYPERTENSION 05/11/2008  . KNEE PAIN, RIGHT 01/26/2008  . UNSPECIFIED TACHYCARDIA 12/07/2007  . HEADACHE 06/04/2007  . HYPOTHYROIDISM NOS 02/17/2007  . FOOT PAIN, RIGHT 02/17/2007  . SCOLIOSIS NEC 02/17/2007  . DIABETES MELLITUS, TYPE I 10/07/2006  . DMYLNATING POLYNEURITIS, CHRONIC INFLAMMATORY 10/07/2006  . MACULAR DEGENERATION 10/07/2006  . DISEASE, CELIAC 10/07/2006    Past Surgical History:  Procedure Laterality Date  . CATARACT EXTRACTION  06/25/06  . FOOT ARTHROPLASTY     left foot moved and stretched tendons   . GLAUCOMA SURGERY  02/12/06  . HIP ARTHROPLASTY     right never bipopsy  . PORTACATH PLACEMENT  2002-2004  . removal of vascular port    . Sciatic Nerve Muscle Biopsy      OB History    Gravida  Para Term Preterm AB Living   0 0 0 0 0 0   SAB TAB Ectopic Multiple Live Births   0 0 0 0 0       Home Medications    Prior to Admission medications   Medication Sig Start Date End Date Taking? Authorizing Provider  Cholecalciferol (VITAMIN D) 1000 UNITS capsule Take 1,000 Units by mouth daily.     Yes [provider]    Cyanocobalamin (VITAMIN B-12 PO) Take by mouth.   Yes [provider]  gabapentin (NEURONTIN) 100 MG capsule TAKE 2 CAPSULES BY MOUTH EVERY DAY AT 8 AM, THEN TAKE 2 CAPSULES AT 4 PM, THEN TAKE 3 CAPSULES AT 11PM Patient taking differently: TAKE 100MG BY MOUTH TWICE DAILY 01/02/15  Yes Jodi Geralds, MD  GLUCAGON EMERGENCY 1 MG injection USE AS DIRECTED 01/25/16  Yes Sherrlyn Hock, MD  Immune Globulin, Human, 5 GM/50ML SOLN Inject into the vein.   Yes [provider]  insulin aspart (NOVOLOG FLEXPEN) 100 UNIT/ML FlexPen Inject up to 50 units subcutaneous daily Patient taking differently: Inject up to 50 units subcutaneous daily based on carb intake. Every 5g carbs=1unit 05/31/16  Yes Sherrlyn Hock, MD  Insulin Glargine (LANTUS SOLOSTAR) 100 UNIT/ML Solostar Pen Inject up to 50 units daily Patient taking differently: Inject up to 50 units daily based on carb intake. Every 5g carbs=1unit 05/31/16  Yes Sherrlyn Hock, MD  levonorgestrel-ethinyl estradiol (SEASONALE,INTROVALE,JOLESSA) 0.15-0.03 MG tablet Take 1 tablet by mouth daily. 11/22/16  Yes Princess Bruins, MD  lisinopril (PRINIVIL,ZESTRIL) 2.5 MG tablet TAKE 1 TABLET (2.5 MG TOTAL) BY MOUTH DAILY. 06/19/16  Yes Sherrlyn Hock, MD  methimazole (TAPAZOLE) 5 MG tablet TAKE TWO TABLETS BY MOUTH IN THE MORNING, THEN TAKE ONE IN THE EVENING Patient taking differently: TAKE 10MG BY MOUTH IN THE MORNING, THEN TAKE 5MG IN THE EVENING 08/05/16  Yes Sherrlyn Hock, MD  methylPREDNISolone sodium succinate (SOLU-MEDROL) 500 MG injection Give 556m IV in 50 ml NS over 1 hour x 1 every 4 weeks. Given by ADeary3/17/15  Yes GRockwell Germany NP  mycophenolate (CELLCEPT) 500 MG tablet TAKE 2 TABLETS BY MOUTH TWICE DAILY (PATIENT MUST CALL FOR APPOINTMENTFOR FURTHER REFILLS) Patient taking differently: TAKE 1,000MG BY MOUTH TWICE DAILY (PATIENT MUST CALL FOR APPOINTMENTFOR FURTHER REFILLS) 12/16/16  Yes  HJodi Geralds MD  propranolol (INDERAL) 20 MG tablet TAKE ONE TABLET BY MOUTH THREE TIMES DAILY Patient taking differently: TAKE 20MG BY MOUTH THREE TIMES DAILY 11/04/16  Yes BSherrlyn Hock MD  Insulin Pen Needle (BD PEN NEEDLE NANO U/F) 32G X 4 MM MISC Use with insulin pen 6x day as directed by provider 05/31/16   BSherrlyn Hock MD  predniSONE (DELTASONE) 20 MG tablet Take 1 tablet by mouth the day before, day of and day after IVIG. 04/26/16   GRockwell Germany NP    Family History Family History  Problem Relation Age of Onset  . Prostate cancer Maternal Grandfather   . Cancer Maternal Grandfather        prostate  . Coronary artery disease Paternal Grandfather   . Diabetes Sister   . Other Sister        thyroid issues  . Thyroid disease Mother   . Cancer Paternal Grandmother        Died at [phone] kidney  . Hyperlipidemia Unknown     Social History Social History  Substance Use Topics  . Smoking status: Never Smoker  .  Smokeless tobacco: Never Used     Comment: passive smoke exposure  . Alcohol use 0.0 oz/week     Comment: OCC     Allergies   Gluten meal and Wheat bran   Review of Systems Review of Systems  Constitutional: Negative for fever.  Respiratory: Negative for cough and shortness of breath.   Gastrointestinal: Negative for abdominal pain, diarrhea, nausea and vomiting.  Genitourinary: Negative for dysuria and frequency.  All other systems reviewed and are negative.    Physical Exam Updated Vital Signs BP (!) 123/94   Pulse 97   Temp 98.7 F (37.1 C) (Oral)   Resp (!) 22   Ht 5' 3"  (1.6 m)   Wt 56.2 kg (124 lb)   LMP 12/20/2016   SpO2 100%   BMI 21.97 kg/m   Physical Exam Physical Exam  Nursing note and vitals reviewed. Constitutional: Well developed, well nourished, non-toxic, and in no acute distress Head: Normocephalic and atraumatic.  Mouth/Throat: Oropharynx is clear and moist.  Neck: Normal range of motion. Neck supple.    Cardiovascular: Tachycardic rate and regular rhythm.   Pulmonary/Chest: Effort normal and breath sounds normal.  Abdominal: Soft. There is no tenderness. There is no rebound and no guarding.  Musculoskeletal: Normal range of motion.  Neurological: Alert, no facial droop, fluent speech Skin: Skin is warm and dry.  Psychiatric: Cooperative   ED Treatments / Results  Labs (all labs ordered are listed, but only abnormal results are displayed) Labs Reviewed  BASIC METABOLIC PANEL - Abnormal; Notable for the following:       Result Value   Sodium 130 (*)    Chloride 99 (*)    Glucose, Bld 206 (*)    All other components within normal limits  HEPATIC FUNCTION PANEL - Abnormal; Notable for the following:    Total Protein 11.1 (*)    Albumin 2.8 (*)    All other components within normal limits  URINALYSIS, ROUTINE W REFLEX MICROSCOPIC - Abnormal; Notable for the following:    APPearance HAZY (*)    Glucose, UA 150 (*)    Hgb urine dipstick LARGE (*)    Ketones, ur 20 (*)    Protein, ur 30 (*)    Squamous Epithelial / LPF 0-5 (*)    All other components within normal limits  CBC  DIFFERENTIAL  CORTISOL  PREGNANCY, URINE  CORTISOL  CORTISOL  ACTH STIMULATION, 3 TIME POINTS  I-STAT TROPONIN, ED  I-STAT CG4 LACTIC ACID, ED  I-STAT CG4 LACTIC ACID, ED    EKG  EKG Interpretation  Date/Time:  Friday December 20 2016 16:55:23 EDT Ventricular Rate:  126 PR Interval:  140 QRS Duration: 64 QT Interval:  302 QTC Calculation: 437 R Axis:   64 Text Interpretation:  Sinus tachycardia Right atrial enlargement Borderline ECG Confirmed by Brantley Stage [id number]) on 12/20/2016 10:46:57 PM       Radiology Dg Chest 2 View  Result Date: 12/20/2016 CLINICAL DATA:  26 y/o F; elevated white blood cell count and increased heart rate. EXAM: CHEST  2 VIEW COMPARISON:  06/12/2016 CT chest.  06/11/2016 chest radiograph. FINDINGS: Stable normal cardiac silhouette. Moderate reverse S curvature of the  spine. Clear lungs. No pleural effusion or pneumothorax. Bones are unremarkable. IMPRESSION: No acute pulmonary process identified. Electronically Signed   By: Kristine Garbe M.D.   On: 12/20/2016 18:08    Procedures Procedures (including critical care time)  Medications Ordered in ED Medications  sodium chloride 0.9 % bolus  1,000 mL (0 mLs Intravenous Stopped 12/20/16 1958)  cosyntropin (CORTROSYN) injection 0.25 mg (0.25 mg Intravenous Given 12/20/16 1951)     Initial Impression / Assessment and Plan / ED Course  I have reviewed the triage vital signs and the nursing notes.  Pertinent labs & imaging results that were available during my care of the patient were reviewed by me and considered in my medical decision making (see chart for details).     26 year old female with history of diabetes, autoimmune thyroiditis, CIDP who presents with one week of palpitations and fatigue. I've spoken with Dr. Tobe Sos on the phone prior to patient's arrival. Plan was for cortisol and ACTH levels to be drawn with sepsis work-up.   She is nontoxic in no acute distress. Initially tachycardic with heart rate in the 110s to 120s but normotensive. With chronic CIDP symptoms, but otherwise nonfocal exam.  Blood work is improved from a few days ago. Her hyponatremia is improving to 130, which is reported her baseline. She has a resolved leukocytosis. Sepsis workup unremarkable with normal lactate. Urinalysis does not show infection and chest x-ray does not show pneumonia or other acute cardiopulmonary processes. She did receive IV fluids with resolution of her tachycardia.  Attempts were made to do ACTH stim test. However, there was miscommunication about the blood draws, and I when I spoke with lab, they state that they are unable to perform this test in the ED. He reported the patient will require admission into the hospital to do it tomorrow morning.  I discussed all of these results with Dr.  Tobe Sos. Given reassuring workup, and patient's well appearance, he did feel that she is stable for discharge home. Her cortisol levels drawn in the ED is reassuring and not suggestive of potential adrenal insufficiency. He requested that she call him tomorrow evening to update him on her status. He will have patient undergo ACTH stim test as an outpatient.  This is discussed with patient. Strict return and follow-up instructions reviewed. She and her father expressed understanding of all discharge instructions and felt comfortable with the plan of care.   Final Clinical Impressions(s) / ED Diagnoses   Final diagnoses:  Palpitations  Fatigue, unspecified type    New Prescriptions New Prescriptions   No medications on file     Forde Dandy, MD 12/21/16 [phone]

## 2016-12-20 NOTE — Telephone Encounter (Signed)
1. Dr. Brantley Stage at the [facility] ED called me to give me an update on Alacia. 2. Subjective: Genita feels much better after receiving iv fluids. Her heart rate of 122 decreased to the 90s and her palpitations ceased.  3. Objective: Serum sodium today before iv fluids was 130, chloride 99, and glucose 206. Her WBC had decreased to 4.8. Her neutrophil count had decreased to 3.1 and monocytes decreased to 0.6. Her Hgb and Hct were normal at 14 and 41% respectively. Lactic acid was normal at 1.59 (ref 0.5-1.9). ACTH stimulation test results were normal with a baseline cortisol value of 3.0 at 18:45 hours, +90 minute cortisol value of 18.9, and 150 minute cortisol value of 24.5.  4. Assessment:   A. She was tachycardic upon presentation to the ED, but the tachycardia resolved after being given iv fluids. It is likely that she was moderately dehydrated.   B. When [place] reported to the ED tonight, her serum sodium had, WBC count, and monocyte count had all returned to baseline.    B. C. Her initial cortisol value of 3 is low-normal for a person of Amaani's age, but is c/w the pattern of diurnal variation in cortisol values that is physiologically normal. Her stimulated cortisol levels were quite good. Although the times for the stimulated cortisol values were not standard and may actually reflect the times that the samples were processed rather than obtained, the cortisol responses to ACTH were very robust, indicating adequate reserve of the hypothalamic-pituitary-adrenal axis. She was not in any danger of an Addisonian crisis.   5. Plan: I certainly agree that Kailee can return home. It is important for her to remain well hydrated. I would like her to call me tomorrow evening with a progress report.  6. I thanked Dr. Oleta Mouse for her excellent and very responsive care of [facility].  Tillman Sers, MD, CDE Adult and Pediatric Endocrinology

## 2016-12-20 NOTE — Telephone Encounter (Signed)
[place] called our office wanting lab results and feeling weird. I returned her call. 2. Subjective:  A. On Tuesday, 12/17/16, she was having tingling in her arms. Her heart rate was in the 120s-130s. Her mind was flustered and she was having problems focusing. The tingling resolved on Wednesday, but the other symptoms persist. Today her resting heart rate is 138-152 and she feels tired. Her BPs have been higher, in the 120s/80s. She denies any breathing problems or UTI symptoms.   B. She continues to take Solumedrol every Monday when she receives her treatments for her CIDP.Marland Kitchen She also continues on her methimazole, 10 mg in the mornings and 5 mg in the evenings.   C. She was recently started on OCPs. 3. Objective:    A. She had labs drawn on 12/17/16. Her TFTs are normal. Her CBC shows an elevated WBC of 15.8 with a left shift and mildly elevated monocyte count of 1,106. Her CMP shows low sodium at 127, but normal potassium of 4.1. Albumin and calcium are low at 3.0 and 8.3 respectively, but her corrected calcium is normal. Her HbA1c is 10.1%, which is lower than it has been since January 2016. 5. Assessment:   A. Retal is a very medically complicated young woman with T1DM, autoimmune thyroid disease (She has both Graves' Dz and Hashimoto's Dz, but the Graves is dominant.), celiac disease, and chronic inflammatory demyelinating polyneuropathy (CIDP).  The CIDP is being treated with weekly injections of IVIG and methylprednisolone. Her last ACTH and cortisol values were normal in November 2016. The weekly methylprednisolone plus her residual cortisol production have appeared to be meeting her needs through her last visit with me in February 2018.   B. Her serum sodium is unusually low for her, since her usual serum sodiums have been in the 131-133 range for the past two years. Although the differential DX is fairly broad here, there is the possibility that her hypothalamic-pituitary-adrenal axis has  finally been compromised after years of methylprednisolone treatments. If she has an acute illness that has caused her high WBC, then perhaps that illness could be overstressing her residual adrenal gland capacity to produce enough cortisol on her own. .   C. Conversely, her WBC, neutrophils, and monocytes are unusually high for her. I wonder if she has an acute illness now. 6.Plan:  A. I asked Chantavia to go to the ED at [facility] for further evaluation and possible admission.   B. I called the ED at [facility] and spoke with Dr. Oleta Mouse. Dr. Oleta Mouse graciously agreed to see that Adrienne is taken care of. I asked Dr. Oleta Mouse to perform an ACTH stimulation test if that test can be done and seems to be indicated. The protocol for the ACTH stimulation test is as follows:   1). At Time Zero, obtain blood samples for both ACTH and cortisol   2). Then give synthetic ACTH (corticotropin or other brand), 250 mcg by iv push   3). Then obtain blood samples for cortisol again at +30 minutes and +60 minutes after the ACTH injection.  Tillman Sers, MD, CDE Adult and Pediatric Endocrinology Cell: [phone]

## 2016-12-20 NOTE — ED Triage Notes (Signed)
Pt c/o palpitations and fatigue since Monday. Pt was sent here by endocrinologist for abnormal labs and elevated HR. Pt endorse fatigue and SOB. Pt denies n/v.

## 2016-12-20 NOTE — Telephone Encounter (Signed)
Routed to provider

## 2016-12-20 NOTE — Telephone Encounter (Signed)
  Who's calling (name and relationship to patient) : [name], self  Best contact number: [phone]  Provider they see: Tobe Sos  Reason for call: Jessica Kelley called in requesting lab results.  Please call her back on [phone].     PRESCRIPTION REFILL ONLY  Name of prescription:  Pharmacy:

## 2016-12-20 NOTE — Telephone Encounter (Signed)
  Who's calling (name and relationship to patient) : Tymeka, self  Best contact number: [phone]  Provider they see: Tobe Sos  Reason for call: Jessica Kelley called in stating that she was feeling really weird and needs to speak with Dr. Tobe Sos asap.  I told her I would call him since he was the on call physician and have him to call her back.  I called Dr. Tobe Sos and left a message with her information for him to call her.     PRESCRIPTION REFILL ONLY  Name of prescription:  Pharmacy:

## 2016-12-21 LAB — URINALYSIS, ROUTINE W REFLEX MICROSCOPIC
Bacteria, UA: NONE SEEN
Bilirubin Urine: NEGATIVE
GLUCOSE, UA: 150 mg/dL — AB
KETONES UR: 20 mg/dL — AB
Leukocytes, UA: NEGATIVE
Nitrite: NEGATIVE
PH: 5 (ref 5.0–8.0)
PROTEIN: 30 mg/dL — AB
Specific Gravity, Urine: 1.023 (ref 1.005–1.030)

## 2016-12-21 NOTE — Discharge Instructions (Signed)
I have reviewed your blood work with Dr. Tobe Sos, and overall your blood work is improved and more reassuring.  Dr. Tobe Sos requested that you call his cellphone tomorrow evening for follow-up  Please return without fail for worsening symptoms, including fever, confusion, worsening weakness, or any other symptoms concerning to you.

## 2016-12-25 LAB — THYROID STIMULATING IMMUNOGLOBULIN

## 2016-12-30 ENCOUNTER — Telehealth (INDEPENDENT_AMBULATORY_CARE_PROVIDER_SITE_OTHER): Payer: Self-pay | Admitting: "Endocrinology

## 2016-12-30 ENCOUNTER — Other Ambulatory Visit (INDEPENDENT_AMBULATORY_CARE_PROVIDER_SITE_OTHER): Payer: Self-pay

## 2016-12-30 MED ORDER — GLUCOSE BLOOD VI STRP: ORAL_STRIP | 6 refills | Status: DC

## 2016-12-30 MED ORDER — CONTOUR NEXT MONITOR W/DEVICE KIT: 1.0000 [IU] | PACK | Freq: Every day | 0 refills | Status: DC

## 2016-12-30 NOTE — Telephone Encounter (Signed)
  Who's calling (name and relationship to patient) : Jessica Kelley, mother  Best contact number: [phone] to leave message, she will call right back)  Provider they see: Tobe Sos  Reason for call: Mother called in stating that [employer] Meter has stopped working.  They have called [employer] and have not been able to get any answers.  [name] is currently having to use her grandfather's meter.       PRESCRIPTION REFILL ONLY  Name of prescription:  Pharmacy:

## 2016-12-30 NOTE — Telephone Encounter (Signed)
Called and left voicemail letting them know that she can come a pick up a meter from [place] and there is a coupon for the test strips for the time being until she gets things situated with [employer], also asked her to call her insurance company and find out what meter they will cover so we can put in a Rx for a new meter and test strips until she gets situated with [employer].

## 2017-01-08 ENCOUNTER — Ambulatory Visit (INDEPENDENT_AMBULATORY_CARE_PROVIDER_SITE_OTHER): Payer: BLUE CROSS/BLUE SHIELD | Admitting: Obstetrics & Gynecology

## 2017-01-08 ENCOUNTER — Ambulatory Visit (INDEPENDENT_AMBULATORY_CARE_PROVIDER_SITE_OTHER): Payer: BLUE CROSS/BLUE SHIELD

## 2017-01-08 DIAGNOSIS — N921 Excessive and frequent menstruation with irregular cycle: Secondary | ICD-10-CM

## 2017-01-08 DIAGNOSIS — Z124 Encounter for screening for malignant neoplasm of cervix: Secondary | ICD-10-CM

## 2017-01-08 NOTE — Progress Notes (Signed)
    Jessica Kelley 1991-04-21 [id number]        25 y.o.  G0 Boyfriend x 8 months  RP:  F/U Pelvic US for menometrorrhagia and complete exam with Pap test because on period at Annua/Gyn visit   HPI:  Well on Seasonal x last visit, but forgot 1 pill, will use condoms this month.  Small spotting after forgetting the pill.  No pelvic pain.  No abnormal vaginal d/c.     Past medical history,surgical history, problem list, medications, allergies, family history and social history were all reviewed and documented in the EPIC chart.  Directed ROS with pertinent positives and negatives documented in the history of present illness/assessment and plan.  Exam:  There were no vitals filed for this visit. General appearance:  Normal  Gyn exam:  Vulva normal                     Speculum:  Cervix normal.  Pap reflex/Gono-Chlam done.                                         Vagina normal  Pelvic US today: T/V Anteverted Uterus 7.4 x 4.4 x 4.0 cm with homogeneous echo.  Tri-layered Endometrium at 4.5 mm.  Right Ovary normal.  Left Ovary normal.  No FF in CDS.  Assessment/Plan:  26 y.o. G0  1. Menometrorrhagia Well on Seasonal.  Pelvic US normal today.  Reassured.  Importance of good compliance to BCPs discussed.  Will use condoms x 1 month after missed pill.  2. Screening for malignant neoplasm of cervix Complete Annual/Gyn exam with Pap reflex/Gono-Chlam. - Pap IG, CT/NG w/ reflex HPV when ASC-U  Counseling on above issues >50% x 15 minutes  Princess Bruins MD, 12:45 PM 01/08/2017

## 2017-01-08 NOTE — Patient Instructions (Signed)
1. Menometrorrhagia Well on Seasonal.  Pelvic US normal today.  Reassured.  Importance of good compliance to BCPs discussed.  Will use condoms x 1 month after missed pill.  2. Screening for malignant neoplasm of cervix Complete Annual/Gyn exam with Pap reflex/Gono-Chlam. - Pap IG, CT/NG w/ reflex HPV when ASC-U  Good to see you today Jessica Kelley!  I will inform you of your results as soon as available.

## 2017-01-09 ENCOUNTER — Ambulatory Visit (INDEPENDENT_AMBULATORY_CARE_PROVIDER_SITE_OTHER): Payer: BLUE CROSS/BLUE SHIELD | Admitting: "Endocrinology

## 2017-01-09 VITALS — BP 112/60 | HR 90 | Wt 124.0 lb

## 2017-01-09 DIAGNOSIS — E1065 Type 1 diabetes mellitus with hyperglycemia: Secondary | ICD-10-CM

## 2017-01-09 DIAGNOSIS — I10 Essential (primary) hypertension: Secondary | ICD-10-CM | POA: Diagnosis not present

## 2017-01-09 DIAGNOSIS — E10649 Type 1 diabetes mellitus with hypoglycemia without coma: Secondary | ICD-10-CM | POA: Diagnosis not present

## 2017-01-09 DIAGNOSIS — R5383 Other fatigue: Secondary | ICD-10-CM

## 2017-01-09 DIAGNOSIS — E0500 Thyrotoxicosis with diffuse goiter without thyrotoxic crisis or storm: Secondary | ICD-10-CM | POA: Diagnosis not present

## 2017-01-09 DIAGNOSIS — E1043 Type 1 diabetes mellitus with diabetic autonomic (poly)neuropathy: Secondary | ICD-10-CM

## 2017-01-09 DIAGNOSIS — E063 Autoimmune thyroiditis: Secondary | ICD-10-CM | POA: Diagnosis not present

## 2017-01-09 DIAGNOSIS — G6181 Chronic inflammatory demyelinating polyneuritis: Secondary | ICD-10-CM | POA: Diagnosis not present

## 2017-01-09 DIAGNOSIS — R000 Tachycardia, unspecified: Secondary | ICD-10-CM

## 2017-01-09 DIAGNOSIS — IMO0001 Reserved for inherently not codable concepts without codable children: Secondary | ICD-10-CM

## 2017-01-09 LAB — POCT GLUCOSE (DEVICE FOR HOME USE): Glucose Fasting, POC: 293 mg/dL — AB (ref 70–99)

## 2017-01-09 NOTE — Patient Instructions (Signed)
Follow up visit in 3 months. Please put Jessica Kelley in a new patient slot or an 8 AM slot.

## 2017-01-09 NOTE — Progress Notes (Signed)
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  HISTORY OF PRESENT ILLNESS: The patient is a 26 year-old Caucasian young woman. The patient was unaccompanied.  47. This 26 year-old Caucasian young woman with an already complicated medical history was first referred to me on 09/25/06 by her primary care provider, Dr. Garnet Koyanagi, for evaluation and management of type I diabetes mellitus, hypoglycemia, and a myriad of other autoimmune issues.   A. CIDP: At about age 73 the child began to limp.  At age 38 she was evaluated at [facility] and diagnosed with CIDP. At the time I first met her she was under the care of Dr. Marcene Brawn of [facility] in [place]. She was on a regular regimen of intravenous treatments at home with IVIG and steroids on the days of therapy. The IVIG treatments were given monthly. She was also on cyclosporine A daily, alternating doses every other day. Because of the CIDP she had  significant problems with muscle atrophy and weakness, as well as multiple orthopedic issues. She was also being followed by Dr. Simonne Come, an orthopedist in [place]. On that first visit her legs and arms were quite weak. She found it very hard to walk or to exercise.  B. Type 1 diabetes mellitus: Her diabetes mellitus was also diagnosed at age 67, just prior to the diagnosis of CIDP. She had been started on a Medtronic Paradigm 712 insulin pump subsequently and was using Humalog lispro insulin in her pump. She had a very difficult time with blood glucose control. At the time of her steroids, her  sugars tended to run very high. However, when she was able to perform some physical activities, she tended to develop hypoglycemia. Her hemoglobin A1c on that first visit was 9.5%.  C. Celiac disease: The patient was diagnosed with celiac disease at approximately age 26. She had been on a gluten-free diet ever since.  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 She was followed both at [facility] and [facility].  2. Since that initial visit with me, I have followed her for the problems listed above plus other new problems that have developed over time.   A. CIDP: Her CIDP course has waxed and waned. At one point she had improved significantly, her medication doses had been reduced, she felt very much stronger, had more stamina, and was doing very well. Unfortunately, she later had a significant relapse of CIDP and lost much of the improvement that she had gained. Since going back to [facility] in the Spring of 2013, however, and starting a new and more intensified IVIG regimen, her strength had improved. She was still very weak physically and muscularly, however, and needed support to walk. As noted below, she gets stronger when her steroids are increased and weaker when the steroids are tapered. In the past two years Dr. Wyline Copas, MD, our senior pediatric neurologist, has assumed the role of managing her CIDP.  B. Type 1 diabetes mellitus: The patient's blood sugars have varied significantly with the course of her CIDP and the treatments for that disease. Her hemoglobin A1c values have ranged from  a low of 8.3% to a high of 14.0%. Most of her A1c values have been in the 8.4-9.6% range.   C. Celiac disease: The patient has really done quite well over the years. She rarely has had signs or symptoms of abdominal problems. Because her antibody levels  fluctuated a great deal, her celiac disease antibodies were negative in March 2012. She was mistakenly told that she did not have celiac disease and that she could resume a normal diet. As expected, resumption of a normal diet caused a recurrence of active celiac disease. In October 2012 she saw a gastroenterologist, Dr. Gale Journey, at [facility] who told her that she did have celiac disease. She is now back on her gluten-free diet and is asymptomatic again.  D. Autoimmune thyroid disease: The patient had a goiter and a TPO antibody level of 287.2 on her first visit in May of 2008, consistent with evolving Hashimoto's Disease. She occasionally had the sensations of swelling and discomfort in her anterior neck, which were also c/w flare-ups of Hashimoto's Disease. In early 2009 she developed increased tachycardia. Lab tests performed in March 2010 by her primary care provider showed a TSH of 0.01 and a T4 of 7.7. It was initially felt that the increase in tachycardia was likely due to a combination of autonomic neuropathy and Hashitoxicosis.  However on follow-up laboratory tests performed in July, her TSH was 0.05, free T4 3.9, and free T3 14.2. Her TSI level done on the assay used at the time was 2.2, with normal being 1.0 or less. It was then evident that she had developed Graves' Disease in the setting of pre-existing Hashimoto's Disease.  I initiated treatment with methimazole, 10 mg twice daily and propranolol 20 mg 3 times daily. Since then we've seen significant swings of her thyroid function tests, partly due to changes in methimazole doses, but also partly due to changes in her immunosuppressant therapies. During the last 2 years her TSH values have ranged from a low of 0.1432 to a high of 14.933. The dose of methimazole has been adjusted as needed to treat her Graves' disease. It has been my hope that her Hashimoto's disease will eventually cancel out her Graves' disease.   3.The patient's last [facility] visit was on  07/11/16.  A. In the interim she went to the ED on 12/20/16 feeling very weak and noting that her heart was racing. Her serum sodium was low at 130 and her glucose was 206. I asked the ED physician to perform an ACTH stimulation test. Her baseline cortisol was 3.0, +90 minute cortisol was 18.9, and +150 minute cortisol was 24.5. After receiving iv fluids she felt well and was discharged to home with the presumptive diagnosis of dehydration. In retrospect, she had been in the hot weather for longer periods of time prior to having the symptoms that caused her to go to the ED.  B. Since then she has felt "fine". Her BPs have been in the range 126-136/86-90. Her heart rate has not been fast.    C. Her energy level has been "a little better" in the past 2 weeks.  She has a good appetite.  D. Her CDIP is about the same. She saw Dr. Edison Nasuti at the  [facility] in June. No changes in her CDIP treatment plan were made. She remains on her regimen of IVIG and iv steroids. She continues to have IVIG every Monday and Thursday. She receives iv steroids on Monday. Her steroid dose has not been  changed since her last visit. better. She uses a walker to help her walk from the parking lot to her job. She has not had many headaches. Dr. Gaynell Face continues to follow her for her CIDP management. Her female [place] shepherd service dog is working out well.     E. 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 Her BGs have increased and she has to take more insulin now  F. She remains on methimazole, 10 mg each morning and 5 mg each evening. She resumed taking lisinopril, 2.5 mg, twice daily. She is still taking propranolol, vitamin D, iron, and a vitamin B complex pill. She stopped the gabaentin after her last visit. She remains on her gluten-free diet.  G. She is no longer using her insulin pump. She began a new MDI regimen in mid-December 2017. She now takes 31 units of Lantus each evening. She  takes Novolog according to our 150/50/5 plan.   4. Pertinent Review of Systems: Constitutional: The patient says she feels "pretty good". She does feel more tired during the work week. She no longer has trouble falling asleep. She sleeps well when she does fall asleep. Her strength and stamina are better.    Eyes: Her contact lenses are helping. Last eye exam was last week. She did not have signs of DM, but she did have some edema.     Neck: The patient has had no problems with anterior neck swelling and tenderness for a year or more.   Heart: The patient has not had any sensation of fast heart rate recently. The patient has no other complaints of palpitations, irregular heart beats, chest pain, or chest pressure.   Gastrointestinal:  She still has stomach upset after eating pork, but not with beef, chicken, and fish. Bowel movents seem normal. The patient has no complaints of postprandial bloating, excessive hunger, acid reflux, upset stomach, stomach aches or pains, diarrhea, or constipation.  Legs: Muscle mass and strength seem to be improved a bit. There are few complaints of numbness, tingling, burning, or pain. No edema is noted.  Feet: There are no obvious new foot problems. There are no complaints of numbness, tingling, burning, or pain. No edema is noted. Neurologic: She still requires the use of her walker, but is doing better. Sensation seems to be normal. Coordination is better since resuming IVIG. GYN: Her LMP was 2 weeks ago. Menses have been regular.   Hypoglycemia: She has not had any documented low BGs recently.   5. Blood glucose printout: She is checking BGs 3-5 times per day. Her average BG is 265, compared with 311 at her last visit. Her BG range was 84-454, compared with 60-High at her last visit. Her insulin deteriorated due to heat earlier this week, resulting in the BGs >400.   PAST MEDICAL, FAMILY, AND SOCIAL HISTORY: 1. School/work: She continues to take college courses on  line. She is working full-time as an Glass blower/designer at [employer]. She has less time to exercise now.        2. Activities: She has been walking less for exercise and less at work.  3. Smoking, alcohol, or drugs: None 4. Primary Care Provider: Dr. Garnet Koyanagi at [facility]. 5. Neurologist: Dr. Wyline Copas  REVIEW OF SYSTEMS: There are no other significant problems involving the patient's other body systems.  PHYSICAL EXAM: BP 112/60   Pulse 90   Wt 124 lb (56.2 kg)   LMP  12/28/2016   BMI 21.97 kg/m    Constitutional: This patient appears reasonably healthy and looks much better and more typical for her age. Her affect and insight are normal today. She is now able to walk somewhat better with just the aid of her walker. However, her gait is still quite abnormal because her right leg is much weaker than her left due to her CIDP. Her weight has remained unchanged.   Head: The head is normocephalic. Face: The face appears normal. There are no obvious dysmorphic features. Eyes: The eyes appear to be normally formed and spaced. Gaze is conjugate. There is no obvious arcus or proptosis. Moisture appears normal. Mouth: The oropharynx and tongue appear normal. Dentition appears to be normal for age. Oral moisture is normal. Neck: On inspection the thyroid gland appears to be enlarged. The gland is still enlarged at 23-24 grams in size. The left lobe is smaller and the right lobe is larger today. The isthmus is still enlarged at about the same size. The thyroid gland is firm, but not tender to palpation. Lungs: The lungs are clear to auscultation. Air movement is good. Heart: Heart rate and rhythm are regular. Heart sounds S1 and S2 are normal. I did not appreciate any pathologic cardiac murmurs. Abdomen: The abdomen is normal in size for the patient's age. Bowel sounds are normal. There is no obvious hepatomegaly, splenomegaly, or other mass effect.  Arms: Muscle size and bulk are  low-normal for age. Hands: There is no tremor today. Phalangeal and metacarpophalangeal joints are normal. Palmar muscles are low-normal for age. Palmar skin is normal. Palmar moisture is normal.. Legs: Muscles appear low-normal for age. No edema is present. Feet: Dorsalis pedal pulses are 1+ on the right and 1-2+ on the left.     Neurologic: Strength is 4-5/5 for age in the upper extremities and 3/5 in the right leg and 4/5 in the left leg. Muscle tone is somewhat low. Sensation to touch is normal in both her legs and feet.    LAB DATA:  Labs 01/09/17: CBG 293  Labs 12/20/16: ACTH stimulation test: Cortisols 3, 18.9, 24.5; sodium 130, albumin 2.8  Labs 12/17/16: HbA1c 10.1%; TSH 1.14, free T4 1.2, free T3 2.6, TSI <89; WBC 15/8, Hgb 13.1, Hct 40/8%  Labs 07/11/16: HbA1c 11.5%, CBG 225  Labs 06/12/16: TSH 2.20, free T4 1.02, free T3 3.0; CBC normal; CMP normal except sodium 133, glucose 259, albumin 2.8, calcium 8.3  Labs 02/22/16: HbA1c 10.7%  Labs 10/18/15: HbA1c 10.8%  Labs 05/09/15: HbA1c 11.1%  Labs 04/07/15: CBC normal; TSH 1.826, free T4 1.06, free T3 2.8, TSI 156; iron 234, CMP normal except for sodium 132, glucose 159, albumin 2.9, calcium 8.7; ACTH 11, cortisol 10.5  Labs 11/08/14: HbA1c 10.9%; TSH 2.234, free T4 0.87, free T3 2.7, TSI pending  Labs 06/01/14: HbA1c is 9.7% today, compared with 11.5% at last visit. Other labs are pending.  Labs 02/17/14: TSH 2.125, free T4 0.91, free T3 3.0  Labs 07/09/13; HbA1c was 11.4%, compared with 9.1% at last visit and with 14.0% at the visit prior. TSH was 0.652, free T4 0.88, free T3 3.3  Labs 10/06/12: TSH 0.088, free T4 1.28, free T3 3.1  Labs 02/25/12: TSH 1.044, free T4 1.45, free T3 3.4  Labs 06/14/11: TSH was 1.633. Free T4 1.47. Free T3 was 3.1. TSI was 56 (normal less than 120).  ASSESSMENT: 1. Type 1 diabetes mellitus: Her BG control is better in terms of her A1c, her average BG, her BG variability, and  her number of low BGs.  She needs a bit more insulin, especially since her steroid dose has increased. She really needs the Medtronic 670G pump and Guardian 2 sensor.  2.  Hypoglycemia: She had no documented episodes in the past month.  3. Autonomic neuropathy with tachycardia: The heart rate is slower, largely due to her lower HbA1c. Her autonomic neuropathy and tachycardia will reverse when her BGs come under even better control.  4-8 Autoimmune thyroid disease, Graves' disease, Hashimoto's disease, and goiter:   A. The patient was euthyroid in November 2016 and was euthyroid again in January and July 2018 on her MTZ doses of 10 mg in the mornings and 5 mg in the evenings. She appears clinically euthyroid today.  Within her thyroid gland there is still an on-going immunologic battle between her Graves Dz B lymphocytes and her Hashimoto's Dz T lymphocytes.   B. Thyrotoxicosis: Her diffuse thyrotoxicosis, secondary to Graves' disease, has waxed and waned over time.  Her thyrotoxicosis was well controlled by her current methimazole doses in January 2018.  C. Hashimoto's thyroiditis: The patient's thyroiditis is clinically quiescent at this time. However, the pattern in which all 3 of her TFTs decreased in parallel together from 2013 to 2014 indicated that she had had a relatively recent flare up of Hashimoto's Dz. I expect that as her Hashimoto's disease progressively destroys more thyroid cells, we will be able to taper her methimazole to zero. She will probably become permanently hypothyroid over time and will require Synthroid hormone replacement.  D.Goiter: Thyroid gland is about the same size, but her lobes have shifted in size again. The waxing and waning of thyroid lobe size is c/w evolving Hashimoto's disease.  9. Celiac disease: Patient is pretty much following gluten-free diet. She is slowly gaining weight and is asymptomatic. 10.  Chronic inflammatory demyelinating polyneuropathy (CIDP) and muscle  weakness: She feels better subjectively and looks better objectively. She appears clinically to be much better at today's visit.      11-12. Fatigue and decreased stamina: These problems are better. Since she has been on a regimen of 1-2 iv glucocorticoid treatment per week for many months, it was possible that her adrenals were suppressed. However, her last ACTH and cortisol levels in November 2016 were normal. Her ACTH stimulation test results in July 2018 were also quite normal.  13. Emotionality: She feels that she is doing better. I continue to be impressed and inspired by Jamice's dignity and courage as she tried to do all that she can to improve her own health. 14-15. Dyspepsia-GERD: She is doing fairly well overall, but I do not know what is causing her episodic stomach aches and vomiting after eating pork. I suspect that she has developed a hypersensitivity to pork proteins. 16. Hypertension: Her BP is good today on her twice daily low-dose lisinopril regimen.   PLAN: 1. Diagnostic: CBG today. I reviewed her lab results from July and August.  2. Therapeutic:   A. Increase the Lantus dose to 32 units if tolerated. Continue the current Novolog plan.  B. Continue current doses of her endocrine meds.   C. Check BGs before meals and at bedtime. 3. Patient education:  4. Follow-up: 3 months in a New Patient slot or last appointment of the day slot so that we will have more time to address her issues.  Level  of Service: This visit lasted in excess of 50 minutes. More than 50% of the visit was devoted to counseling  Jessica Sers, MD, CDE Adult and Pediatric Endocrinology

## 2017-01-10 ENCOUNTER — Encounter (INDEPENDENT_AMBULATORY_CARE_PROVIDER_SITE_OTHER): Payer: Self-pay | Admitting: "Endocrinology

## 2017-01-10 LAB — PAP IG, CT-NG, RFX HPV ASCU
CHLAMYDIA PROBE AMP: NOT DETECTED
GC Probe Amp: NOT DETECTED

## 2017-01-16 ENCOUNTER — Encounter: Payer: Self-pay | Admitting: Obstetrics & Gynecology

## 2017-01-16 NOTE — Progress Notes (Signed)
Pt aware of results. Pt states she will call back to schedule repeat pap smear

## 2017-02-06 ENCOUNTER — Telehealth (INDEPENDENT_AMBULATORY_CARE_PROVIDER_SITE_OTHER): Payer: Self-pay | Admitting: Pediatrics

## 2017-02-06 NOTE — Telephone Encounter (Signed)
3 page fax received from [facility] [place] [facility], requesting Dr. Gaynell Face to review, sign and date forms.  Once completed, please:  Fax: ATTN: Jennifer @ [facility]   (F) [phone]   Fax has been labeled and placed in Dr. Melanee Left office in his tray.

## 2017-02-10 NOTE — Telephone Encounter (Signed)
Form completed return for disposition

## 2017-02-14 ENCOUNTER — Telehealth (INDEPENDENT_AMBULATORY_CARE_PROVIDER_SITE_OTHER): Payer: Self-pay | Admitting: Pediatrics

## 2017-02-17 ENCOUNTER — Telehealth (INDEPENDENT_AMBULATORY_CARE_PROVIDER_SITE_OTHER): Payer: Self-pay | Admitting: "Endocrinology

## 2017-02-17 NOTE — Telephone Encounter (Signed)
Attempted to contact mother, mailbox full, unable to leave a message.

## 2017-02-17 NOTE — Telephone Encounter (Signed)
  Who's calling (name and relationship to patient) : Ander Purpura, mother  Best contact number: [phone]  Provider they see: Tobe Sos  Reason for call: Mother called and stated Clariza is having dental surgery today at 10:45 at Dr. Dorian Heckle office.  Mom is wanting to make sure it will be ok to contact you in case something happens today.     PRESCRIPTION REFILL ONLY  Name of prescription:  Pharmacy:

## 2017-02-18 NOTE — Telephone Encounter (Signed)
Orders were signed and returned for disposition.

## 2017-02-18 NOTE — Telephone Encounter (Signed)
2 page fax received from [facility], requesting Dr. Gaynell Face to review, sign and date form and return.  Fax: ATTN: [place] [facility]          (F) [phone]   Fax has been labeled and placed in Dr. Melanee Left office in his tray.

## 2017-02-26 ENCOUNTER — Other Ambulatory Visit (INDEPENDENT_AMBULATORY_CARE_PROVIDER_SITE_OTHER): Payer: Self-pay | Admitting: "Endocrinology

## 2017-03-01 ENCOUNTER — Other Ambulatory Visit (INDEPENDENT_AMBULATORY_CARE_PROVIDER_SITE_OTHER): Payer: Self-pay | Admitting: "Endocrinology

## 2017-03-01 DIAGNOSIS — E1065 Type 1 diabetes mellitus with hyperglycemia: Principal | ICD-10-CM

## 2017-03-01 DIAGNOSIS — IMO0001 Reserved for inherently not codable concepts without codable children: Secondary | ICD-10-CM

## 2017-03-04 ENCOUNTER — Other Ambulatory Visit (INDEPENDENT_AMBULATORY_CARE_PROVIDER_SITE_OTHER): Payer: Self-pay | Admitting: "Endocrinology

## 2017-03-04 DIAGNOSIS — E1065 Type 1 diabetes mellitus with hyperglycemia: Principal | ICD-10-CM

## 2017-03-04 DIAGNOSIS — IMO0001 Reserved for inherently not codable concepts without codable children: Secondary | ICD-10-CM

## 2017-03-12 ENCOUNTER — Telehealth (INDEPENDENT_AMBULATORY_CARE_PROVIDER_SITE_OTHER): Payer: Self-pay | Admitting: Pediatrics

## 2017-03-13 NOTE — Telephone Encounter (Signed)
2 page fax received from [facility], requesting Dr. Gaynell Face to review, sign and date the Nursing Order.  Fax: ATTN: Nursing Dept          (F) [phone]   Fax has been labeled and placed in Dr. Melanee Left office in his tray.

## 2017-03-27 ENCOUNTER — Telehealth (INDEPENDENT_AMBULATORY_CARE_PROVIDER_SITE_OTHER): Payer: Self-pay | Admitting: Pediatrics

## 2017-04-03 NOTE — Telephone Encounter (Signed)
2 page fax received from [facility], requesting Dr. Gaynell Face to review, sign and date attached orders and return.  Fax: ATTN: [facility]          (F) [phone]   Fax has been labeled and placed in Dr. Melanee Left bin.

## 2017-04-03 NOTE — Telephone Encounter (Signed)
Signed and returned to Tiffanie.

## 2017-04-21 ENCOUNTER — Ambulatory Visit (INDEPENDENT_AMBULATORY_CARE_PROVIDER_SITE_OTHER): Payer: BLUE CROSS/BLUE SHIELD | Admitting: "Endocrinology

## 2017-06-12 ENCOUNTER — Ambulatory Visit (INDEPENDENT_AMBULATORY_CARE_PROVIDER_SITE_OTHER): Payer: BLUE CROSS/BLUE SHIELD | Admitting: "Endocrinology

## 2017-06-22 ENCOUNTER — Other Ambulatory Visit (INDEPENDENT_AMBULATORY_CARE_PROVIDER_SITE_OTHER): Payer: Self-pay | Admitting: "Endocrinology

## 2017-06-22 DIAGNOSIS — IMO0001 Reserved for inherently not codable concepts without codable children: Secondary | ICD-10-CM

## 2017-06-22 DIAGNOSIS — E1065 Type 1 diabetes mellitus with hyperglycemia: Principal | ICD-10-CM

## 2017-06-25 ENCOUNTER — Ambulatory Visit (INDEPENDENT_AMBULATORY_CARE_PROVIDER_SITE_OTHER): Payer: BLUE CROSS/BLUE SHIELD | Admitting: "Endocrinology

## 2017-06-25 ENCOUNTER — Encounter (INDEPENDENT_AMBULATORY_CARE_PROVIDER_SITE_OTHER): Payer: Self-pay | Admitting: "Endocrinology

## 2017-06-25 VITALS — BP 120/68 | HR 130 | Wt 141.0 lb

## 2017-06-25 DIAGNOSIS — E1065 Type 1 diabetes mellitus with hyperglycemia: Secondary | ICD-10-CM | POA: Diagnosis not present

## 2017-06-25 DIAGNOSIS — E10649 Type 1 diabetes mellitus with hypoglycemia without coma: Secondary | ICD-10-CM | POA: Diagnosis not present

## 2017-06-25 DIAGNOSIS — G6181 Chronic inflammatory demyelinating polyneuritis: Secondary | ICD-10-CM | POA: Diagnosis not present

## 2017-06-25 DIAGNOSIS — E063 Autoimmune thyroiditis: Secondary | ICD-10-CM | POA: Diagnosis not present

## 2017-06-25 DIAGNOSIS — R5383 Other fatigue: Secondary | ICD-10-CM | POA: Diagnosis not present

## 2017-06-25 DIAGNOSIS — R000 Tachycardia, unspecified: Secondary | ICD-10-CM | POA: Diagnosis not present

## 2017-06-25 DIAGNOSIS — E0500 Thyrotoxicosis with diffuse goiter without thyrotoxic crisis or storm: Secondary | ICD-10-CM

## 2017-06-25 DIAGNOSIS — E1043 Type 1 diabetes mellitus with diabetic autonomic (poly)neuropathy: Secondary | ICD-10-CM | POA: Diagnosis not present

## 2017-06-25 DIAGNOSIS — R1012 Left upper quadrant pain: Secondary | ICD-10-CM

## 2017-06-25 DIAGNOSIS — K900 Celiac disease: Secondary | ICD-10-CM

## 2017-06-25 DIAGNOSIS — IMO0001 Reserved for inherently not codable concepts without codable children: Secondary | ICD-10-CM

## 2017-06-25 DIAGNOSIS — F411 Generalized anxiety disorder: Secondary | ICD-10-CM

## 2017-06-25 LAB — POCT GLYCOSYLATED HEMOGLOBIN (HGB A1C): Hemoglobin A1C: 9.6

## 2017-06-25 LAB — POCT GLUCOSE (DEVICE FOR HOME USE): POC Glucose: 92 mg/dl (ref 70–99)

## 2017-06-25 NOTE — Progress Notes (Signed)
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  HISTORY OF PRESENT ILLNESS: The patient is a 27 year-old Caucasian young woman. The patient was unaccompanied.  67. This 27 year-old Caucasian young woman with an already complicated medical history was first referred to me on 09/25/06 by her primary care provider, Dr. Garnet Koyanagi, for evaluation and management of type I diabetes mellitus, hypoglycemia, and a myriad of other autoimmune issues.   A. CIDP: At about age 58 the child began to limp.  At age 58 she was evaluated at [facility] and diagnosed with CIDP. At the time I first met her she was under the care of Dr. Marcene Brawn of [facility] in [place]. She was on a regular regimen of monthly intravenous treatments at home with IVIG and steroids on the days of therapy. She was also taking cyclosporine A daily, with alternating doses every other day. Because of the CIDP she had  significant problems with muscle atrophy and weakness, as well as multiple orthopedic issues. She was also being followed by Dr. Simonne Come, an orthopedist in [place]. On that first visit her legs and arms were quite weak. She found it very hard to walk or to exercise.  B. Type 1 diabetes mellitus: Her diabetes mellitus was also diagnosed at age 75, just prior to the diagnosis of CIDP. She had been started on a Medtronic Paradigm 712 insulin pump subsequently and was using Humalog lispro insulin in her pump. She had a very difficult time with blood glucose control. At the time of her steroids, her sugars tended to run  very high. However, when she was able to perform some physical activities, she tended to develop hypoglycemia. Her hemoglobin A1c on that first visit was 9.5%.  C. Celiac disease: The patient was diagnosed with celiac disease at approximately age 89. She had been on a gluten-free diet ever since.  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 She was followed both at [facility] and at the [facility] eye clinic.  2. Since that initial visit with me, I have followed her for the problems listed above plus other new problems that have developed over time.   A. CIDP: Her CIDP course has waxed and waned. At one point she had improved significantly, her medication doses had been reduced, she felt very much stronger, had more stamina, and was doing very well. Unfortunately, she later had a significant relapse of CIDP and lost much of the improvement that she had gained. Since going back to [facility] in the Spring of 2013, however, and starting a new and more intensified IVIG regimen, her strength had improved. She was still very weak physically and muscularly, however, and needed support to walk. As noted below, she gets stronger when her steroids are increased and weaker when the steroids are tapered. In the past two years Dr. Wyline Copas, MD, our senior pediatric neurologist, has assumed the role of managing her CIDP.  B. Type 1 diabetes mellitus: The patient's blood sugars have varied significantly with the course of her CIDP and the treatments for that disease. Her hemoglobin A1c values have ranged from a low  of 8.3% to a high of 14.0%. Most of her A1c values have been in the 8.4-10.1% range.   C. Celiac disease: The patient has really done quite well over the years. She rarely has had signs or symptoms of abdominal problems. Because her antibody levels fluctuated a great  deal, her celiac disease antibodies were negative in March 2012. She was mistakenly told that she did not have celiac disease and that she could resume a normal diet. As expected, resumption of a normal diet caused a recurrence of active celiac disease. In October 2012 she saw a gastroenterologist, Dr. Gale Journey, at [facility] who told her that she did have celiac disease. She is now back on her gluten-free diet and is asymptomatic again.  D. Autoimmune thyroid disease: The patient had a goiter and a TPO antibody level of 287.2 on her first visit in May of 2008, consistent with evolving Hashimoto's Disease. She occasionally had the sensations of swelling and discomfort in her anterior neck, which were also c/w flare-ups of Hashimoto's Disease. In early 2009 she developed increased tachycardia. Lab tests performed in March 2010 by her primary care provider showed a TSH of 0.01 and a T4 of 7.7. It was initially felt that the increase in tachycardia was likely due to a combination of autonomic neuropathy and Hashitoxicosis.  However on follow-up laboratory tests performed in July, her TSH was 0.05, free T4 3.9, and free T3 14.2. Her TSI level done on the assay used at the time was 2.2, with normal being 1.0 or less. It was then evident that she had developed Graves' Disease in the setting of pre-existing Hashimoto's Disease.  I initiated treatment with methimazole, 10 mg twice daily and propranolol 20 mg 3 times daily. Since then we've seen significant swings of her thyroid function tests, partly due to changes in methimazole doses, but also partly due to changes in her immunosuppressant therapies. During the last 2 years her TSH values have ranged from a low of 0.1432 to a high of 14.933. The dose of methimazole has been adjusted as needed to treat her Graves' disease. It has been my hope that her Hashimoto's disease will eventually cancel out her Graves' disease.   3.The patient's last [facility] visit was on 01/09/17. At that  visit I increased her Lantus dose to 32 units.   A. In the interim she has been fairly healthy. She has gained 20 pounds, but does not think that she has been eating as much. Her "thoughts are sometimes fuzzy". She also notes that when she drinks a lot of fluid she develops aching in her left anterior ribs and mid-back.  B. Her BPs have been in the range of 130s/90 at home. Her HR often feels fast.     C. Her energy level has been a little less.   D. Her CDIP is about the same. She saw Dr. Edison Nasuti at the  [facility] in June 2018. No changes in her CDIP treatment plan were made. She remains on her regimen of IVIG and iv steroids. She continues to have IVIG every Monday and Thursday. She receives iv steroids on Monday. Her steroid dose has not been changed since her last visit. better. She uses a walker to help her walk from the parking lot to her job. She has not had any headaches. Dr. Gaynell Face continues to follow her for her CIDP management. Her female [place] shepherd service dog is working out well.     E. 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 Her BGs have increased and she has to take more insulin now  F. She remains on methimazole, 10 mg each morning and 5 mg each evening. She resumed taking lisinopril, 2.5 mg, twice daily. She is still taking propranolol, vitamin D, iron, and a vitamin B complex pill. She remains on her gluten-free diet.  G. She is no longer using her insulin pump. She began a new MDI regimen in mid-December 2017. She now takes 32 units of Lantus each evening. She takes Novolog according to our 150/50/5 plan.   H. "My anxiety has been crazy lately. I'm constantly overthinking." Her overthinking often cause insomnia.   I. She moved out of her home and now lives with her best female friend. She now works full-time.   4. Pertinent Review of Systems: Constitutional: The patient says she feels "good, but there's definitely something wrong."  She feel " a little more tired".  She often has trouble falling asleep. She has 2-3 caffeinated drinks per day. She usually sleeps well when she does fall asleep. Her strength and stamina are bout the same.     Eyes: Her contact lenses are helping. Last eye exam was in August 2018. She did not have signs of DM, but she did have some edema.     Neck: The patient has had no problems with anterior neck swelling and tenderness for a year or more.   Heart: The patient has not had any sensation of fast heart rate recently. The patient has no other complaints of palpitations, irregular heart beats, chest pain, or chest pressure.   Gastrointestinal:  She no longer has stomach upset after eating pork, but now she does not eat much pork. She does not have an upset stomach when she eats beef, chicken, and fish. Bowel movents seem normal. The patient has no complaints of postprandial bloating, excessive hunger, acid reflux, upset stomach, stomach aches or pains, diarrhea, or constipation.  Legs: Muscle mass and strength seem to be about the same. There are few complaints of numbness, tingling, burning, or pain. No edema is noted.  Feet: There are no obvious new foot problems. There are no complaints of numbness, tingling, burning, or pain. No edema is noted. Neurologic: She still requires the use of her walker, but is doing better. Sensation seems to be normal. Coordination is better since resuming IVIG. GYN: Her LMP was 1-2 weeks ago. Menses have been regular.   Hypoglycemia: She has had a few low BGs between 9-11 PM, especially if she does not eat as many carbs as usual.    5. Blood glucose printout: She is checking BGs 0-7 times per day. She misses many morning BG checks, many lunch checks, many dinner checks, and many bedtime checks. When she does not check BGs she often does not take any Novolog. She also missed two Lantus doses recently when she ran out of the Lantus over a weekend. Her average BG is 285,  compared with 265 at her last visit and with 311 at prior visit. Her BG range was 59-high, compared with 84-454 at her last visit and with 60-High at her prior visit. When she takes insulin her BGs do decrease as expected. She had one low BG of 59, one BG of 69, and two BGs in the 70s. She had two high (>600) BGs, 2 in the 500s, and 14 in the 400s.   PAST MEDICAL, FAMILY, AND SOCIAL HISTORY: 1. School/work:  She continues to take college courses on line. She is working full-time as an Glass blower/designer at [employer]. She has less time to exercise now. She is now engaged to be married.   2. Activities: She has been walking less for exercise and less at work.  3. Smoking, alcohol, or drugs: None 4. Primary Care Provider: Dr. Garnet Koyanagi at [facility]. 5. Neurologist: Dr. Wyline Copas  REVIEW OF SYSTEMS: There are no other significant problems involving the patient's other body systems.  PHYSICAL EXAM: BP 120/68   Pulse (!) 130   Wt 141 lb (64 kg)   BMI 24.98 kg/m    Constitutional: This patient appears reasonably healthy and looks much better and more typical for her age. She has gained 17 pounds since her last visit. Her affect and insight are normal today. She is now able to walk somewhat better with just the aid of her walker. However, her gait is still quite abnormal because her right leg is much weaker than her left due to her CIDP.   Head: The head is normocephalic. Face: The face appears normal. There are no obvious dysmorphic features. Eyes: The eyes appear to be normally formed and spaced. Gaze is conjugate. There is no obvious arcus or proptosis. Moisture appears normal. Mouth: The oropharynx and tongue appear normal. Dentition appears to be normal for age. Oral moisture is normal. She has a 1+ tongue tremor.  Neck: On inspection the thyroid gland appears to be enlarged. The gland is still enlarged at 23-24 grams in size. Both lobes are enlarged today, with the left lobe  being a bit larger than the right lobe. The isthmus is mildly enlarged at about the same size. The thyroid gland is firm, but not tender to palpation. Lungs: The lungs are clear to auscultation. Air movement is good. Heart: Heart rate and rhythm are regular. Heart sounds S1 and S2 are normal. I did not appreciate any pathologic cardiac murmurs. Abdomen: The abdomen is larger in size today. Bowel sounds are normal. There is no obvious hepatomegaly, splenomegaly, or other mass effect.  Arms: Muscle size and bulk are low-normal for age. Hands: There is a 1+ tremor today. Phalangeal and metacarpophalangeal joints are normal. Palmar muscles are low-normal for age. Palmar skin shows 1+ palmar erythema. Palmar moisture is normal.. Legs: Muscles appear low-normal for age. No edema is present. Feet: Dorsalis pedal pulses are very faint 1+ on the right and 1+ on the left.     Neurologic: Strength is 4-5/5 for age in the upper extremities and 3/5 in the right leg and 4/5 in the left leg. Muscle tone is somewhat low. Sensation to touch is normal in both her legs and feet.    LAB DATA:  Labs 06/25/17: HbA1c 9.6%, CBG 92  Labs 01/09/17: CBG 293  Labs 12/20/16: ACTH stimulation test: Cortisols 3, 18.9, 24.5; sodium 130, albumin 2.8  Labs 12/17/16: HbA1c 10.1%; TSH 1.14, free T4 1.2, free T3 2.6, TSI <89; WBC 15/8, Hgb 13.1, Hct 40/8%  Labs 07/11/16: HbA1c 11.5%, CBG 225  Labs 06/12/16: TSH 2.20, free T4 1.02, free T3 3.0; CBC normal; CMP normal except sodium 133, glucose 259, albumin 2.8, calcium 8.3  Labs 02/22/16: HbA1c 10.7%  Labs 10/18/15: HbA1c 10.8%  Labs 05/09/15: HbA1c 11.1%  Labs 04/07/15: CBC normal; TSH 1.826, free T4 1.06, free T3 2.8, TSI 156; iron 234, CMP normal except for sodium 132, glucose 159, albumin 2.9, calcium 8.7; ACTH 11, cortisol 10.5  Labs 11/08/14: HbA1c  10.9%; TSH 2.234, free T4 0.87, free T3 2.7, TSI pending  Labs 06/01/14: HbA1c is 9.7% today, compared with 11.5% at last  visit. Other labs are pending.  Labs 02/17/14: TSH 2.125, free T4 0.91, free T3 3.0  Labs 07/09/13; HbA1c was 11.4%, compared with 9.1% at last visit and with 14.0% at the visit prior. TSH was 0.652, free T4 0.88, free T3 3.3  Labs 10/06/12: TSH 0.088, free T4 1.28, free T3 3.1  Labs 02/25/12: TSH 1.044, free T4 1.45, free T3 3.4  Labs 06/14/11: TSH was 1.633. Free T4 1.47. Free T3 was 3.1. TSI was 56 (normal less than 120).                       ASSESSMENT: 1. Type 1 diabetes mellitus: Her BG control is better in terms of her A1c, but not as good in terms of her average BG. Her BG variability is still excessive, largely based upon whether or not she checks her BGs and takes her Novolog.  She really needs the Medtronic 670G pump and Guardian 3 sensor, but for those devices to be successful, she must commit to checking her BGs at least 4 times daily and entering her carb data into the pump.   2.  Hypoglycemia: She has one low BG of 59, one of 69, and two in the 70s.   3. Autonomic neuropathy with tachycardia: Her heart rate is much higher. Although this change could be due to autonomic neuropathy alone, she also has some symptoms and signs of hyperthyroidism. 4-8 Autoimmune thyroid disease, Graves' disease, Hashimoto's disease, and goiter:   A. Thyrotoxicosis: Her diffuse thyrotoxicosis, secondary to Graves' disease, has waxed and waned over time.  Her thyrotoxicosis was well controlled by her current methimazole doses in January 2018, but she appears to be hyperthyroid today.  B. Hashimoto's thyroiditis: The patient's thyroiditis is clinically quiescent at this time. However, the pattern in which all 3 of her TFTs decreased in parallel together from 2013 to 2014 indicated that she had had a relatively recent flare up of Hashimoto's Dz. I expect that as her Hashimoto's disease progressively destroys more thyroid cells, we will be able to taper her methimazole to zero. She will probably become  permanently hypothyroid over time and will require Synthroid hormone replacement.  C. Goiter: Thyroid gland is about the same size, but her lobes have shifted in size again. The waxing and waning of thyroid lobe size is c/w evolving Hashimoto's disease.  9. Celiac disease: Patient is following gluten-free diet. She is slowly gaining weight and is asymptomatic. 10.  Chronic inflammatory demyelinating polyneuropathy (CIDP) and muscle weakness: From a neurologic viewpoint she feels better subjectively and looks better objectively. She appears clinically to be about the same at today's visit.      11-12. Fatigue and decreased stamina: These problems are better. Since she has been on a regimen of 1-2 iv glucocorticoid treatment per week for many months, it was possible that her adrenals were suppressed. However, her last ACTH and cortisol levels in November 2016 were normal. Her ACTH stimulation test results in July 2018 were also quite normal.  13. Emotionality: She is having more anxiety. She has had similar symptoms when she was initially thyrotoxic. If the anxiety is not due to being hyperthyroid, I suggested a referral to Ms. Rea College, MSN.   14-15. Dyspepsia-GERD: She is doing fairly well overall. 16. Hypertension: Her BP is good today on her twice daily low-dose  lisinopril regimen.  17. Left rib/LUQ pains and back pains. It is possible that she could be having intermittent pancreatitis. Today, however she has no symptoms or signs of pancreatitis.  18. Adjustment reaction to illness and medical therapy: I continue to be impressed and inspired by Lasharn's dignity and courage as she tries to do all that she can to improve her own health.  PLAN: 1. Diagnostic: HbA1C and CBG today. I reviewed her lab results from July and August. I ordered new labs, to include a CMP, CBC, TFTs, TSI, and urinary microalbumin/creatinine ratio. 2. Therapeutic:   A. Continue the Lantus dose of 32 units as tolerated.  Continue the current Novolog plan. Check BGs at meals and at bedtime every day.   B. Continue current doses of her endocrine meds.   C. Order the Medtronic 670G pump and Guardian 3 sensor.  3. Patient education: We discussed all of the above at great length.  4. Follow-up: 1 month in a New Patient slot or last appointment of the day slot so that we will have adequate time to address her issues.  Level of Service: This visit lasted in excess of 90 minutes. More than 50% of the visit was devoted to counseling  Tillman Sers, MD, CDE Adult and Pediatric Endocrinology

## 2017-06-25 NOTE — Patient Instructions (Signed)
Follow up in one month in either the 11:15 AM or 3:45 PM appointment slot.

## 2017-06-29 LAB — CBC WITH DIFFERENTIAL/PLATELET
BASOS PCT: 0.8 %
Basophils Absolute: 62 cells/uL (ref 0–200)
Eosinophils Absolute: 31 cells/uL (ref 15–500)
Eosinophils Relative: 0.4 %
HEMATOCRIT: 41 % (ref 35.0–45.0)
HEMOGLOBIN: 13.7 g/dL (ref 11.7–15.5)
LYMPHS ABS: 1451 {cells}/uL (ref 850–3900)
MCH: 30.2 pg (ref 27.0–33.0)
MCHC: 33.4 g/dL (ref 32.0–36.0)
MCV: 90.3 fL (ref 80.0–100.0)
MPV: 11.4 fL (ref 7.5–12.5)
Monocytes Relative: 8.4 %
NEUTROS ABS: 5600 {cells}/uL (ref 1500–7800)
Neutrophils Relative %: 71.8 %
Platelets: 248 10*3/uL (ref 140–400)
RBC: 4.54 10*6/uL (ref 3.80–5.10)
RDW: 12.5 % (ref 11.0–15.0)
Total Lymphocyte: 18.6 %
WBC: 7.8 10*3/uL (ref 3.8–10.8)
WBCMIX: 655 {cells}/uL (ref 200–950)

## 2017-06-29 LAB — COMPREHENSIVE METABOLIC PANEL
AG RATIO: 0.5 (calc) — AB (ref 1.0–2.5)
ALBUMIN MSPROF: 3.2 g/dL — AB (ref 3.6–5.1)
ALKALINE PHOSPHATASE (APISO): 81 U/L (ref 33–115)
ALT: 19 U/L (ref 6–29)
AST: 26 U/L (ref 10–30)
BUN: 20 mg/dL (ref 7–25)
CHLORIDE: 97 mmol/L — AB (ref 98–110)
CO2: 27 mmol/L (ref 20–32)
Calcium: 8.5 mg/dL — ABNORMAL LOW (ref 8.6–10.2)
Creat: 0.63 mg/dL (ref 0.50–1.10)
GLOBULIN: 6.1 g/dL — AB (ref 1.9–3.7)
Glucose, Bld: 224 mg/dL — ABNORMAL HIGH (ref 65–99)
POTASSIUM: 4 mmol/L (ref 3.5–5.3)
Sodium: 132 mmol/L — ABNORMAL LOW (ref 135–146)
Total Bilirubin: 0.3 mg/dL (ref 0.2–1.2)
Total Protein: 9.3 g/dL — ABNORMAL HIGH (ref 6.1–8.1)

## 2017-06-29 LAB — T4, FREE: Free T4: 1.1 ng/dL (ref 0.8–1.8)

## 2017-06-29 LAB — TSH: TSH: 1.92 mIU/L

## 2017-06-29 LAB — MICROALBUMIN / CREATININE URINE RATIO
Creatinine, Urine: 61 mg/dL (ref 20–275)
MICROALB UR: 3.2 mg/dL
MICROALB/CREAT RATIO: 52 ug/mg{creat} — AB (ref <30)

## 2017-06-29 LAB — THYROID STIMULATING IMMUNOGLOBULIN

## 2017-06-29 LAB — T3, FREE: T3, Free: 3.1 pg/mL (ref 2.3–4.2)

## 2017-06-30 ENCOUNTER — Telehealth (INDEPENDENT_AMBULATORY_CARE_PROVIDER_SITE_OTHER): Payer: Self-pay | Admitting: "Endocrinology

## 2017-06-30 NOTE — Telephone Encounter (Signed)
°  Who's calling (name and relationship to patient) : Nastashia (Self) Best contact number: [phone] Provider they see: Dr. Tobe Sos Reason for call: Pt would like to discuss lab results when they are ready.

## 2017-07-01 NOTE — Telephone Encounter (Signed)
Routed to provider

## 2017-07-04 NOTE — Telephone Encounter (Signed)
Routed to provider

## 2017-07-04 NOTE — Telephone Encounter (Signed)
Jessica Kelley called stating that she received her labs and has a few questions about them. She would like for Dr. Tobe Sos to call her and discuss it with her.

## 2017-07-04 NOTE — Telephone Encounter (Signed)
Routed to Dr. Brennan 

## 2017-07-05 ENCOUNTER — Telehealth (INDEPENDENT_AMBULATORY_CARE_PROVIDER_SITE_OTHER): Payer: Self-pay | Admitting: "Endocrinology

## 2017-07-05 NOTE — Telephone Encounter (Addendum)
1. Patient had me paged. She feels "a little off". 2. Subjective: She has been feeling bad for the past few days, has had nausea and vomiting, and can't eat a lot. She has not had diarrhea. BGs have been in the 100s and 200s. Ketones were negative this morning. 3. Objective: I reviewed her lab results from 06/25/17 with her. Her TFTS and TSI were normal. Her CBC was normal. Her CMP was normal except for an elevated glucose and borderline low albumin and calcium. Her urinary microalbumin/ creatinine ratio was elevated due to her chronic hyperglycemia. 4. 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  5. Plan: She needs to take in fluids and continue her current DM care plan. Call if having more problems. Tillman Sers, MD, CDE

## 2017-07-07 ENCOUNTER — Telehealth (INDEPENDENT_AMBULATORY_CARE_PROVIDER_SITE_OTHER): Payer: Self-pay | Admitting: "Endocrinology

## 2017-07-07 NOTE — Telephone Encounter (Signed)
Who's calling (name and relationship to patient) : Deionna (patient) Best contact number: [phone] Provider they see: Tobe Sos Reason for call: Caller states that she got test results this week. Is wandering if the doctor could call her.  She thinks they may be a bit off.   Call ID: [id number]   Dr Tobe Sos handled the call on 07/05/17 at 4:13pm according to patient chart.     PRESCRIPTION REFILL ONLY  Name of prescription:  Pharmacy:

## 2017-07-17 NOTE — Telephone Encounter (Signed)
Handled by provider.

## 2017-07-18 ENCOUNTER — Telehealth (INDEPENDENT_AMBULATORY_CARE_PROVIDER_SITE_OTHER): Payer: Self-pay | Admitting: Pediatrics

## 2017-07-18 NOTE — Telephone Encounter (Signed)
Placed in Dr. Melanee Left basket for him to sign on Monday when he returns.

## 2017-07-18 NOTE — Telephone Encounter (Signed)
2 page fax received from [place], requesting Dr. Gaynell Face to review, sign and date attached orders and return.  Documents have been placed in doctors basket up front.    Fax: ATTNBinnie Kand                     (F) [phone]

## 2017-07-23 ENCOUNTER — Ambulatory Visit (INDEPENDENT_AMBULATORY_CARE_PROVIDER_SITE_OTHER): Payer: BLUE CROSS/BLUE SHIELD | Admitting: "Endocrinology

## 2017-07-23 ENCOUNTER — Ambulatory Visit (INDEPENDENT_AMBULATORY_CARE_PROVIDER_SITE_OTHER): Payer: Self-pay | Admitting: "Endocrinology

## 2017-08-04 NOTE — Telephone Encounter (Signed)
Received 4 page fax from [place]; requesting Provider's signature on all orders. (urgent)  Placed in Provider's basket up front   Fax: Attn: [facility]  [phone]

## 2017-08-04 NOTE — Telephone Encounter (Signed)
Forms have been placed on Dr. Melanee Left desk

## 2017-08-06 ENCOUNTER — Ambulatory Visit (INDEPENDENT_AMBULATORY_CARE_PROVIDER_SITE_OTHER): Payer: BLUE CROSS/BLUE SHIELD | Admitting: "Endocrinology

## 2017-08-19 ENCOUNTER — Ambulatory Visit (INDEPENDENT_AMBULATORY_CARE_PROVIDER_SITE_OTHER): Payer: BLUE CROSS/BLUE SHIELD | Admitting: "Endocrinology

## 2017-08-25 ENCOUNTER — Other Ambulatory Visit (INDEPENDENT_AMBULATORY_CARE_PROVIDER_SITE_OTHER): Payer: Self-pay | Admitting: "Endocrinology

## 2017-08-25 DIAGNOSIS — E1065 Type 1 diabetes mellitus with hyperglycemia: Principal | ICD-10-CM

## 2017-08-25 DIAGNOSIS — IMO0001 Reserved for inherently not codable concepts without codable children: Secondary | ICD-10-CM

## 2017-08-27 ENCOUNTER — Ambulatory Visit (INDEPENDENT_AMBULATORY_CARE_PROVIDER_SITE_OTHER): Payer: BLUE CROSS/BLUE SHIELD | Admitting: "Endocrinology

## 2017-08-27 ENCOUNTER — Encounter (INDEPENDENT_AMBULATORY_CARE_PROVIDER_SITE_OTHER): Payer: Self-pay | Admitting: "Endocrinology

## 2017-08-27 VITALS — BP 122/74 | HR 116 | Wt 144.8 lb

## 2017-08-27 DIAGNOSIS — E063 Autoimmune thyroiditis: Secondary | ICD-10-CM | POA: Diagnosis not present

## 2017-08-27 DIAGNOSIS — R1013 Epigastric pain: Secondary | ICD-10-CM | POA: Diagnosis not present

## 2017-08-27 DIAGNOSIS — E10649 Type 1 diabetes mellitus with hypoglycemia without coma: Secondary | ICD-10-CM

## 2017-08-27 DIAGNOSIS — E0500 Thyrotoxicosis with diffuse goiter without thyrotoxic crisis or storm: Secondary | ICD-10-CM

## 2017-08-27 DIAGNOSIS — E1065 Type 1 diabetes mellitus with hyperglycemia: Secondary | ICD-10-CM

## 2017-08-27 DIAGNOSIS — G6181 Chronic inflammatory demyelinating polyneuritis: Secondary | ICD-10-CM | POA: Diagnosis not present

## 2017-08-27 DIAGNOSIS — IMO0001 Reserved for inherently not codable concepts without codable children: Secondary | ICD-10-CM

## 2017-08-27 DIAGNOSIS — E1043 Type 1 diabetes mellitus with diabetic autonomic (poly)neuropathy: Secondary | ICD-10-CM

## 2017-08-27 DIAGNOSIS — K900 Celiac disease: Secondary | ICD-10-CM | POA: Diagnosis not present

## 2017-08-27 DIAGNOSIS — I10 Essential (primary) hypertension: Secondary | ICD-10-CM | POA: Diagnosis not present

## 2017-08-27 DIAGNOSIS — F4320 Adjustment disorder, unspecified: Secondary | ICD-10-CM | POA: Diagnosis not present

## 2017-08-27 DIAGNOSIS — R000 Tachycardia, unspecified: Secondary | ICD-10-CM | POA: Diagnosis not present

## 2017-08-27 LAB — POCT GLUCOSE (DEVICE FOR HOME USE): POC Glucose: 209 mg/dl — AB (ref 70–99)

## 2017-08-27 NOTE — Patient Instructions (Signed)
Follow up visit in one month in either a new patient slot before lunch or at the end of the day.

## 2017-08-27 NOTE — Progress Notes (Signed)
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  HISTORY OF PRESENT ILLNESS: The patient is a 27 year-old Caucasian young woman. The patient was unaccompanied.  66. This 27 year-old Caucasian young woman with an already complicated medical history was first referred to me on 09/25/06 by her primary care provider, Dr. Garnet Kelley, for evaluation and management of type I diabetes mellitus, hypoglycemia, and a myriad of other autoimmune issues.   A. CIDP: At about age 47 the child began to limp.  At age 24 she was evaluated at [facility] and diagnosed with CIDP. At the time I first met her she was under the care of Dr. Marcene Kelley of [facility] in [place]. She was on a regular regimen of monthly intravenous treatments at home with IVIG and steroids on the days of therapy. She was also taking cyclosporine A daily, with alternating doses every other day. Because of the CIDP she had  significant problems with muscle atrophy and weakness, as well as multiple orthopedic issues. She was also being followed by Dr. Simonne Kelley, an orthopedist in [place]. On that first visit her legs and arms were quite weak. She found it very hard to walk or to exercise.  B. Type 1 diabetes mellitus: Her diabetes mellitus was also diagnosed at age 73, just prior to the diagnosis of CIDP. She had been started on a Medtronic Paradigm 712 insulin pump subsequently and was using Humalog lispro insulin in her pump. She had a very difficult time with blood glucose control. At the time of her steroids, her sugars tended to run  very high. However, when she was able to perform some physical activities, she tended to develop hypoglycemia. Her hemoglobin A1c on that first visit was 9.5%.  C. Celiac disease: The patient was diagnosed with celiac disease at approximately age 73. She had been on a gluten-free diet ever since.  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 She was followed both at [facility] and at the [facility] eye clinic.  2. Since that initial visit with me, I have followed her for the problems listed above plus other new problems that have developed over time.   A. CIDP: Her CIDP course has waxed and waned. At one point she had improved significantly, her medication doses had been reduced, she felt very much stronger, had more stamina, and was doing very well. Unfortunately, she later had a significant relapse of CIDP and lost much of the improvement that she had gained. Since going back to [facility] in the Spring of 2013, however, and starting a new and more intensified IVIG regimen, her strength had improved. She was still very weak physically and muscularly, however, and needed support to walk. As noted below, she gets stronger when her steroids are increased and weaker when the steroids are tapered. In the past two years Dr. Wyline Copas, MD, our senior pediatric neurologist, has assumed the role of managing her CIDP.  B. Type 1 diabetes mellitus: The patient's blood sugars have varied significantly with the course of her CIDP and the treatments for that disease. Her hemoglobin A1c values have ranged from a low  of 8.3% to a high of 14.0%. Most of her A1c values have been in the 8.4-10.1% range.   C. Celiac disease: The patient has really done quite well over the years. She rarely has had signs or symptoms of abdominal problems. Because her antibody levels fluctuated a great  deal, her celiac disease antibodies were negative in March 2012. She was mistakenly told that she did not have celiac disease and that she could resume a normal diet. As expected, resumption of a normal diet caused a recurrence of active celiac disease. In October 2012 she saw a gastroenterologist, Dr. Gale Kelley, at [facility] who told her that she did have celiac disease. She is now back on her gluten-free diet and is asymptomatic again.  D. Autoimmune thyroid disease: The patient had a goiter and a TPO antibody level of 287.2 on her first visit in May of 2008, consistent with evolving Hashimoto's Disease. She occasionally had the sensations of swelling and discomfort in her anterior neck, which were also c/w flare-ups of Hashimoto's Disease. In early 2009 she developed increased tachycardia. Lab tests performed in March 2010 by her primary care provider showed a TSH of 0.01 and a T4 of 7.7. It was initially felt that the increase in tachycardia was likely due to a combination of autonomic neuropathy and Hashitoxicosis.  However on follow-up laboratory tests performed in July, her TSH was 0.05, free T4 3.9, and free T3 14.2. Her TSI level done on the assay used at the time was 2.2, with normal being 1.0 or less. It was then evident that she had developed Graves' Disease in the setting of pre-existing Hashimoto's Disease.  I initiated treatment with methimazole, 10 mg twice daily and propranolol 20 mg 3 times daily. Since then we've seen significant swings of her thyroid function tests, partly due to changes in methimazole doses, but also partly due to changes in her immunosuppressant therapies. During the last 2 years her TSH values have ranged from a low of 0.1432 to a high of 14.933. The dose of methimazole has been adjusted as needed to treat her Graves' disease. It has been my hope that her Hashimoto's disease will eventually cancel out her Graves' disease.   3.The patient's last [facility] visit was on 2/06/1. At that  visit I continued her Lantus dose of 32 units.   A. In the interim she has been fairly healthy, except for a viral episode in early February. She has gained 3 pounds. She is somewhat frustrated because she is trying to walk more and is not losing weight., Her thoughts are "sometimes fuzzy, but not as bad".   B. The nurse who comes to her home for treatments uses a wrist BP cuff and usually measures BPs in the 130s/80s-90s, especially if the BP is taken early in the mornings. Tilla takes her 2.5 mg of lisinopril first thing in the mornings and again at bedtime. There may be some days when the BP is checked before the morning lisinopril dose has Kelley fully on board.    C. Her energy level has been better.   D. Her CIDP is about the same. She saw Dr. Edison Nasuti at the  [facility] in June 2018. No changes in her CDIP treatment plan were made. She remains on her regimen of IVIG and iv steroids. She continues to have IVIG every Monday and Thursday. She receives iv steroids on Monday. Her steroid dose has not been changed since her last visit. better. She uses a walker to help  her walk from the parking lot to her job. She has not had any headaches. Dr. Gaynell Face continues to follow her for her CIDP management. Her female [place] shepherd service dog is working out well.     E. 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   F. Her BGs have been somewhat lower, but still variable.   G. She remains on methimazole, 10 mg each morning and 5 mg each evening. She is taking lisinopril, 2.5 mg, twice daily. She is also still taking propranolol, vitamin D, iron, and a vitamin B complex pill. She remains on her gluten-free diet.  H. She began a new MDI regimen in mid-December 2017. She now takes 32 units of Lantus each evening. She takes Novolog according to our 150/50/5 plan.   I. "My anxiety has decreased a lot." Her insomnia has improved.   J. She moved out of her home  and now lives with her best female friend.   K. She "pulled" her left shoulder recently when trying to stand up.   4. Pertinent Review of Systems: Constitutional: The patient says she feels "good". She does not feel as tired. She has 12-24 ounces of caffeinated drinks per day. She usually sleeps well when she does fall asleep. Her strength and stamina are bout the same.     Eyes: Her contact lenses are helping. Last eye exam was in August 2018. She did not have signs of DM, but she did have some edema. She will have a follow up exam in June.  Neck: The patient has not had any problems with anterior neck swelling and tenderness for more than a year.   Heart: The patient has not had any sensation of fast heart rate recently. The patient has no other complaints of palpitations, irregular heart beats, chest pain, or chest pressure.   Gastrointestinal:  She does not have an upset stomach when she eats beef, chicken, and fish, but still avoids pork.  Bowel movents seem normal. The patient has no complaints of postprandial bloating, excessive hunger, acid reflux, upset stomach, stomach aches or pains, diarrhea, or constipation.  Legs: Muscle mass and strength seem to be about the same. There are few complaints of numbness, tingling, burning, or pain. No edema is noted.  Feet: There are no obvious new foot problems. There are no complaints of numbness, tingling, burning, or pain. No edema is noted. Neurologic: She still requires the use of her walker. Muscle weakness is about the same. Sensation seems to be normal. Coordination is about the same. GYN: Her LMP was this past week. Menses have been regular.   Hypoglycemia: She has had a few low BGs.    5. Blood glucose printout: She is checking BGs 0-5 times per day. She missed BG checks on two days. She misses some morning BG checks, some lunch checks, some dinner checks, and many bedtime checks. When she does not check BGs she often guesses at the Novolog  dose. Her average BG is 262, compared with 268 at her last visit and with 265 at her prior visit. Her BG range was 65-556, compared with 59-high at her last visit and with 84-454 at her prior visit. When she takes insulin her BGs decrease as expected. She had one low BG of 64, one of 65, and two of 77, all as the first BG of the day after hot having checked BGs at bedtime the nights before. Her  morning BGs are often good, but the BGs tend to increase during the day, especially if she does not take enough insulin at lunch and dinner.    PAST MEDICAL, FAMILY, AND SOCIAL HISTORY: 1. School/work: She is working full-time as an Glass blower/designer at [employer]. She has less time to exercise now.  2. Activities: She has been walking more.  3. Smoking, alcohol, or drugs: None 4. Primary Care Provider: Dr. Garnet Kelley at [facility]. 5. Neurologist: Dr. Wyline Copas  REVIEW OF SYSTEMS: There are no other significant problems involving the patient's other body systems.  PHYSICAL EXAM: BP 122/74   Pulse (!) 116   Wt 144 lb 12.8 oz (65.7 kg)   BMI 25.65 kg/m    Constitutional: This patient appears reasonably healthy and looks pretty good and typical for her age. She has gained 3 pounds since her last visit. Her affect and insight are normal today. She still has significant difficulty with walking with her walker. Her gait is still quite abnormal because her right leg is much weaker than her left due to her CIDP.   Head: The head is normocephalic. Face: The face appears normal. There are no obvious dysmorphic features. Eyes: The eyes appear to be normally formed and spaced. Gaze is conjugate. There is no obvious arcus or proptosis. Moisture appears normal. Mouth: The oropharynx and tongue appear normal. Dentition appears to be normal for age. Oral moisture is normal. She has no tongue tremor.  Neck: On inspection the thyroid gland is again visibly enlarged. The gland is again diffusely enlarged  at 23-24 grams in size. Both lobes are symmetrically enlarged today. The isthmus is mildly enlarged at about the same size. The thyroid gland is firm, but not tender to palpation. Lungs: The lungs are clear to auscultation. Air movement is good. Heart: Heart rate and rhythm are regular. Heart sounds S1 and S2 are normal. I did not appreciate any pathologic cardiac murmurs. Abdomen: The abdomen is larger in size today. Bowel sounds are normal. There is no obvious hepatomegaly, splenomegaly, or other mass effect.  Arms: Muscle size and bulk are low-normal for age. Hands: There is a trace tremor today. Phalangeal and metacarpophalangeal joints are normal. Palmar muscles are low-normal for age. Palmar skin shows no palmar erythema. Palmar moisture is normal.. Legs: Muscles appear low-normal for age. No edema is present. Feet: Dorsalis pedal pulses are very faint 1+ on the right and 1+ on the left.     Neurologic: Strength is 4-5/5 for age in the upper extremities and 3/5 in the right leg and 4/5 in the left leg. Muscle tone is somewhat low. Sensation to touch is normal in both her legs and feet.    LAB DATA:  Labs 08/27/17: CBG 220  Labs 06/25/17: HbA1c 9.6%, CBG 92; TSH 1.92, free T4 1.1, free T3 3.1, TSI <89; urine microalbumin/creatinine ratio 53 (ref <30); CBC normal; CMP normal, except for glucose 224, sodium 132, albumin 3.2 (ref 3.6-5.1), calcium 8.5 (ref 8.6-10.2, corrected to 9.2 for albumin level), globulin 6.1 (ref 1.9-3.7)  Labs 01/09/17: CBG 293  Labs 12/20/16: ACTH stimulation test: Cortisols 3, 18.9, 24.5; sodium 130, albumin 2.8  Labs 12/17/16: HbA1c 10.1%; TSH 1.14, free T4 1.2, free T3 2.6, TSI <89; WBC 15/8, Hgb 13.1, Hct 40/8%  Labs 07/11/16: HbA1c 11.5%, CBG 225  Labs 06/12/16: TSH 2.20, free T4 1.02, free T3 3.0; CBC normal; CMP normal except sodium 133, glucose 259, albumin 2.8, calcium 8.3  Labs 02/22/16: HbA1c  10.7%  Labs 10/18/15: HbA1c 10.8%  Labs 05/09/15: HbA1c  11.1%  Labs 04/07/15: CBC normal; TSH 1.826, free T4 1.06, free T3 2.8, TSI 156; iron 234, CMP normal except for sodium 132, glucose 159, albumin 2.9, calcium 8.7; ACTH 11, cortisol 10.5  Labs 11/08/14: HbA1c 10.9%; TSH 2.234, free T4 0.87, free T3 2.7, TSI pending  Labs 06/01/14: HbA1c is 9.7% today, compared with 11.5% at last visit. Other labs are pending.  Labs 02/17/14: TSH 2.125, free T4 0.91, free T3 3.0  Labs 07/09/13; HbA1c was 11.4%, compared with 9.1% at last visit and with 14.0% at the visit prior. TSH was 0.652, free T4 0.88, free T3 3.3  Labs 10/06/12: TSH 0.088, free T4 1.28, free T3 3.1  Labs 02/25/12: TSH 1.044, free T4 1.45, free T3 3.4  Labs 06/14/11: TSH was 1.633. Free T4 1.47. Free T3 was 3.1. TSI was 56 (normal less than 120).                       ASSESSMENT: 1. Type 1 diabetes mellitus: Her BG control is a bit better in terms of her average BG. Her BG variability is still excessive, largely based upon whether or not she checks her BGs and takes her Novolog.  She really needs the Medtronic 670G pump and Guardian 3 sensor, but for those devices to be successful, she must commit to checking her BGs at least 4 times daily and entering her carb data into the pump.   2.  Hypoglycemia: She has one low BG of 65, one of 67, and two of 77.  All occurred as the first BG of the day as noted above.  3. Autonomic neuropathy with tachycardia: Her heart rate is lower, but still too high. As her BGs Kelley under better control the inappropriate sinus tachycardia will improve.   4-8 Autoimmune thyroid disease, Graves' disease, Hashimoto's disease, and goiter:   A. Thyrotoxicosis: Her diffuse thyrotoxicosis, secondary to Graves' disease, has waxed and waned over time.  Her thyrotoxicosis was well controlled by her current methimazole doses in January 2018 and again in February 2019 . She appears to be clinically euthyroid today.   B. Hashimoto's thyroiditis: The patient's thyroiditis is  clinically quiescent at this time. However, the pattern in which all 3 of her TFTs decreased in parallel together from 2013 to 2014 indicated that she had had a relatively recent flare up of Hashimoto's Dz. I expect that as her Hashimoto's disease progressively destroys more thyroid cells, we will be able to taper her methimazole to zero. She will probably become permanently hypothyroid over time and will require Synthroid hormone replacement.  C. Goiter: Thyroid gland is about the same size, but her lobes have shifted slightly in size again. The waxing and waning of thyroid lobe size is c/w evolving Hashimoto's disease.  9. Celiac disease: Patient is following gluten-free diet. She is slowly gaining weight and is asymptomatic. 10.  Chronic inflammatory demyelinating polyneuropathy (CIDP) and muscle weakness: She appears clinically to be about the same at today's visit.      11-12. Fatigue and decreased stamina: These problems are better. Since she had been on a regimen of 1-2 iv glucocorticoid treatment per week for many months, it was possible that her adrenals were suppressed. However, her last ACTH and cortisol levels in November 2016 were normal. Her ACTH stimulation test results in July 2018 were also quite normal.  13. Emotionality: She is having much less anxiety. She has  had similar symptoms when she was initially thyrotoxic. If the anxiety worsens, I suggested a referral to Ms. Rea College, MSN.   14-15. Dyspepsia-GERD: She is doing fairly well overall. 16. Hypertension: Her BP is good today on her twice daily low-dose lisinopril regimen. When she comes to our clinic her BPs are good. At home, however, when using a wrist cuff, her BPs are higher. Given Ellia's muscular weakness and tendency to be physically unbalanced, it is not appropriate to increase her BP medication too much, otherwise she will have more falls.  17. Left rib/LUQ pains and back pains. It was possible that she could be  having intermittent pancreatitis in the past. At her last visit and again today, however, she has no symptoms or signs of pancreatitis.  18. Adjustment reaction to illness and medical therapy: I continue to be impressed and inspired by Jessica Kelley's dignity and courage as she tries to do all that she can to improve her own health.  PLAN: 1. Diagnostic: CBG today. I reviewed her lab results from July and August 2018 and February 2019.  2. Therapeutic:   A. Continue the Lantus dose of 32 units as tolerated. Continue the current Novolog plan. Check BGs at meals and at bedtime every day.   B. Continue current doses of her endocrine meds.   C. Order the Medtronic 670G pump and Guardian 3 sensor when she is ready to commit to doing all of the fingerstick BGs and pump site changes that will be required.  3. Patient education: We discussed all of the above at great length.  4. Follow-up: 1 month in a New Patient slot or last appointment of the day slot so that we will have adequate time to address her issues.  Level of Service: This visit lasted in excess of 85 minutes. More than 50% of the visit was devoted to counseling  Tillman Sers, MD, CDE Adult and Pediatric Endocrinology

## 2017-09-11 ENCOUNTER — Other Ambulatory Visit (INDEPENDENT_AMBULATORY_CARE_PROVIDER_SITE_OTHER): Payer: Self-pay | Admitting: *Deleted

## 2017-09-11 ENCOUNTER — Telehealth (INDEPENDENT_AMBULATORY_CARE_PROVIDER_SITE_OTHER): Payer: Self-pay | Admitting: "Endocrinology

## 2017-09-11 ENCOUNTER — Other Ambulatory Visit (INDEPENDENT_AMBULATORY_CARE_PROVIDER_SITE_OTHER): Payer: Self-pay | Admitting: "Endocrinology

## 2017-09-11 ENCOUNTER — Other Ambulatory Visit (INDEPENDENT_AMBULATORY_CARE_PROVIDER_SITE_OTHER): Payer: Self-pay

## 2017-09-11 DIAGNOSIS — IMO0001 Reserved for inherently not codable concepts without codable children: Secondary | ICD-10-CM

## 2017-09-11 DIAGNOSIS — E1065 Type 1 diabetes mellitus with hyperglycemia: Principal | ICD-10-CM

## 2017-09-11 MED ORDER — INSULIN PEN NEEDLE 32G X 4 MM MISC: 3 refills | Status: DC

## 2017-09-11 MED ORDER — INSULIN PEN NEEDLE 32G X 4 MM MISC: 5 refills | Status: DC

## 2017-09-11 NOTE — Telephone Encounter (Signed)
Returned TC to [place] to clarify phone note. Needs nano Pen needles.

## 2017-09-11 NOTE — Telephone Encounter (Signed)
°  Who's calling (name and relationship to patient) : Lauren - mom  Best contact number: [phone]  Provider they see: Tobe Sos  Reason for call: Requesting that we process the approval for Nano insulin pens.     PRESCRIPTION REFILL ONLY  Name of prescription: Nano insulin pens   Pharmacy: [facility]

## 2017-09-26 ENCOUNTER — Ambulatory Visit (INDEPENDENT_AMBULATORY_CARE_PROVIDER_SITE_OTHER): Payer: BLUE CROSS/BLUE SHIELD | Admitting: "Endocrinology

## 2017-10-02 ENCOUNTER — Encounter: Payer: Self-pay | Admitting: Medical

## 2017-10-02 ENCOUNTER — Ambulatory Visit: Payer: BLUE CROSS/BLUE SHIELD | Admitting: Medical

## 2017-10-02 VITALS — BP 140/90 | HR 124 | Temp 98.3°F | Resp 16 | Ht 60.0 in | Wt 148.8 lb

## 2017-10-02 DIAGNOSIS — R05 Cough: Secondary | ICD-10-CM

## 2017-10-02 DIAGNOSIS — J0100 Acute maxillary sinusitis, unspecified: Secondary | ICD-10-CM

## 2017-10-02 DIAGNOSIS — J029 Acute pharyngitis, unspecified: Secondary | ICD-10-CM | POA: Diagnosis not present

## 2017-10-02 DIAGNOSIS — R0981 Nasal congestion: Secondary | ICD-10-CM

## 2017-10-02 DIAGNOSIS — R059 Cough, unspecified: Secondary | ICD-10-CM

## 2017-10-02 LAB — POCT RAPID STREP A (OFFICE): Rapid Strep A Screen: NEGATIVE

## 2017-10-02 MED ORDER — AMOXICILLIN-POT CLAVULANATE 875-125 MG PO TABS: 1.0000 | ORAL_TABLET | Freq: Two times a day (BID) | ORAL | 0 refills | Status: DC

## 2017-10-02 MED ORDER — FLUTICASONE PROPIONATE 50 MCG/ACT NA SUSP: 2.0000 | Freq: Every day | NASAL | 1 refills | Status: DC

## 2017-10-02 NOTE — Progress Notes (Signed)
Subjective:    Patient ID: Jessica Kelley, female    DOB: May 23, 1990, 27 y.o.   MRN: [id number]  HPI    Pt in with slight tickle in her throat. Intermittent tickle to throat and cough. Symptoms for one week. Pt had some sinus pressure and nasal congestion for about a week as well. Last night her throat hurt even to swallow her own saliva.  LMP- 2 weeks ago.     Review of Systems  Constitutional: Negative for chills, fatigue and fever.  HENT: Positive for congestion, sinus pressure, sinus pain and sneezing.   Respiratory: Positive for cough. Negative for chest tightness, shortness of breath and wheezing.   Cardiovascular: Negative for chest pain.  Musculoskeletal: Negative for back pain and myalgias.  Skin: Negative for rash.  Neurological: Positive for headaches. Negative for dizziness, tremors, speech difficulty, weakness and numbness.       Last night had ha with light senstivity. Hx of migraines since age 73 yo. Lamonte Sakai now is much less.   Now level 3/10. Last night 8/10.  Hematological: Negative for adenopathy. Does not bruise/bleed easily.  Psychiatric/Behavioral: Negative for behavioral problems and confusion.    Past Medical History:  Diagnosis Date  . Celiac disease   . 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 Diabetes type I ([place])    Dr. Tobe Sos  . Glaucoma   . Hypothyroidism   . Macular degeneration   . Pilonidal cyst   . Scoliosis   . Wyburn-Mason syndrome      Social History   Socioeconomic History  . Marital status: Single    Spouse name: Not on file  . Number of children: Not on file  . Years of education: Not on file  . Highest education level: Not on file  Occupational History  . Not on file  Social Needs  . Financial resource strain: Not on file  . Food insecurity:    Worry: Not on file    Inability: Not on file  . Transportation needs:    Medical: Not on file    Non-medical: Not on file  Tobacco Use  .  Smoking status: Never Smoker  . Smokeless tobacco: Never Used  . Tobacco comment: passive smoke exposure  Substance and Sexual Activity  . Alcohol use: Yes    Alcohol/week: 0.0 oz    Comment: OCC  . Drug use: No  . Sexual activity: Yes    Partners: Male    Birth control/protection: Abstinence, None    Comment: 1ST INTERCORSE- 24, PARTNERS- 1   Lifestyle  . Physical activity:    Days per week: Not on file    Minutes per session: Not on file  . Stress: Not on file  Relationships  . Social connections:    Talks on phone: Not on file    Gets together: Not on file    Attends religious service: Not on file    Active member of club or organization: Not on file    Attends meetings of clubs or organizations: Not on file    Relationship status: Not on file  . Intimate partner violence:    Fear of current or ex partner: Not on file    Emotionally abused: Not on file    Physically abused: Not on file    Forced sexual activity: Not on file  Other Topics Concern  . Not on file  Social History Narrative   Leydy is a 28 yo woman who attends [employer].  She is doing excellent.    She is employed with a [employer], [employer].   She lives with her parents and has one sister, 52 yo.   She enjoys working, camping, and walking outside.    Past Surgical History:  Procedure Laterality Date  . CATARACT EXTRACTION  06/25/06  . FOOT ARTHROPLASTY     left foot moved and stretched tendons   . GLAUCOMA SURGERY  02/12/06  . HIP ARTHROPLASTY     right never bipopsy  . PORTACATH PLACEMENT  2002-2004  . removal of vascular port    . Sciatic Nerve Muscle Biopsy      Family History  Problem Relation Age of Onset  . Prostate cancer Maternal Grandfather   . Cancer Maternal Grandfather        prostate  . Coronary artery disease Paternal Grandfather   . Diabetes Sister   . Other Sister        thyroid issues  . Thyroid disease Mother   . Cancer Paternal Grandmother        Died at 59 - kidney  .  Hyperlipidemia Unknown     Allergies  Allergen Reactions  . Gluten Meal   . Wheat Bran     Current Outpatient Medications on File Prior to Visit  Medication Sig Dispense Refill  . Blood Glucose Monitoring Suppl (CONTOUR NEXT MONITOR) w/Device KIT 1 Units by Does not apply route daily. 2 kit 0  . Cholecalciferol (VITAMIN D) 1000 UNITS capsule Take 1,000 Units by mouth daily.      . Cyanocobalamin (VITAMIN B-12 PO) Take by mouth.    . gabapentin (NEURONTIN) 100 MG capsule TAKE 2 CAPSULES BY MOUTH EVERY DAY AT 8 AM, THEN TAKE 2 CAPSULES AT 4 PM, THEN TAKE 3 CAPSULES AT 11PM 210 capsule 5  . GLUCAGON EMERGENCY 1 MG injection USE AS DIRECTED 1 kit 9  . glucose blood (CONTOUR NEXT TEST) test strip Use to check glucose 6x daily 200 each 6  . Immune Globulin, Human, 5 GM/50ML SOLN Inject into the vein.    . Insulin Glargine (LANTUS SOLOSTAR) 100 UNIT/ML Solostar Pen INJECT UP TO 50 UNITS SUBCUTANEOUSLY ONCE DAILY 5 pen 5  . Insulin Pen Needle (INSUPEN PEN NEEDLES) 32G X 4 MM MISC Inject insulin via insulin pen 7 x daily 250 each 5  . levonorgestrel-ethinyl estradiol (SEASONALE,INTROVALE,JOLESSA) 0.15-0.03 MG tablet Take 1 tablet by mouth daily. 1 Package 4  . lisinopril (PRINIVIL,ZESTRIL) 2.5 MG tablet TAKE ONE TABLET BY MOUTH ONCE DAILY 30 tablet 6  . methimazole (TAPAZOLE) 5 MG tablet TAKE TWO TABLETS BY MOUTH IN THE MORNING, THEN TAKE ONE IN THE EVENING (Patient taking differently: TAKE 10MG BY MOUTH IN THE MORNING, THEN TAKE 5MG IN THE EVENING) 90 tablet 5  . methylPREDNISolone sodium succinate (SOLU-MEDROL) 500 MG injection Give 578m IV in 50 ml NS over 1 hour x 1 every 4 weeks. Given by [place] each 12  . mycophenolate (CELLCEPT) 500 MG tablet TAKE 2 TABLETS BY MOUTH TWICE DAILY (PATIENT MUST CALL FOR APPOINTMENTFOR FURTHER REFILLS) (Patient taking differently: TAKE 1,000MG BY MOUTH TWICE DAILY (PATIENT MUST CALL FOR APPOINTMENTFOR FURTHER REFILLS)) 120 tablet 0  . NOVOLOG  FLEXPEN 100 UNIT/ML FlexPen INJECT 50 UNITS SUBCUTANEOUSLY ONCE DAILY 15 mL 5  . predniSONE (DELTASONE) 20 MG tablet Take 1 tablet by mouth the day before, day of and day after IVIG. 36 tablet 0  . propranolol (INDERAL) 20 MG tablet TAKE 1 TABLET BY MOUTH THREE TIMES  DAILY 90 tablet 0   No current facility-administered medications on file prior to visit.     BP (!) 148/88 (BP Location: Left Arm, Patient Position: Sitting, Cuff Size: Normal)   Pulse (!) 124   Temp 98.3 F (36.8 C) (Oral)   Resp 16   Ht 5' (1.524 m)   Wt 148 lb 12.8 oz (67.5 kg)   SpO2 98%   BMI 29.06 kg/m      Objective:   Physical Exam  General  Mental Status - Alert. General Appearance - Well groomed. Not in acute distress.  Skin Rashes- No Rashes.  HEENT Head- Normal. Ear Auditory Canal - Left- Normal. Right - Normal.Tympanic Membrane- Left- Normal. Right- Normal. Eye Sclera/Conjunctiva- Left- Normal. Right- Normal. Nose & Sinuses Nasal Mucosa- Left-  Boggy and Congested. Right-  Boggy and  Congested.Bilateral maxillary and frontal sinus pressure. Mouth & Throat Lips: Upper Lip- Normal: no dryness, cracking, pallor, cyanosis, or vesicular eruption. Lower Lip-Normal: no dryness, cracking, pallor, cyanosis or vesicular eruption. Buccal Mucosa- Bilateral- No Aphthous ulcers. Oropharynx- No Discharge or Erythema. Tonsils: Characteristics- Bilateral- faint Erythema  Size/Enlargement- Bilateral- No enlargement. Discharge- bilateral-None.  Neck Neck- Supple. No Masses.   Chest and Lung Exam Auscultation: Breath Sounds:-Clear even and unlabored.  Cardiovascular Auscultation:Rythm- Regular, rate and rhythm. Murmurs & Other Heart Sounds:Ausculatation of the heart reveal- No Murmurs.  Lymphatic Head & Neck General Head & Neck Lymphatics: Bilateral: Description- faint enlarged submandibular lymph node.       Assessment & Plan:  You do have recent nasal congestion with some slight sinus pressure.   Concern for possible sinusitis.  In addition your high level of throat pain last night causes concern for strep despite rapid strep test stating negative.  Sometimes we get false negative results with a rapid test.  I did go ahead and prescribe you Augmentin antibiotic.  For nasal congestion I prescribed Flonase.  For recent intermittent cough, I prescribed benzonatate.  You have had recent described migraine type headache.  The headache is now subsiding.  Still minimally present so I offered Toradol.  You did decline that.  In the future if you do have recurrent migraines and not responding to your current regimen keep in mind we could give you Toradol IM but that would require office visit.  Follow-up in 7 days or as needed.  Mackie Pai, PA-C

## 2017-10-02 NOTE — Patient Instructions (Signed)
You do have recent nasal congestion with some slight sinus pressure.  Concern for possible sinusitis.  In addition your high level of throat pain last night causes concern for strep despite rapid strep test stating negative.  Sometimes we get false negative results with a rapid test.  I did go ahead and prescribe you Augmentin antibiotic.  For nasal congestion I prescribed Flonase.  For recent intermittent cough, I prescribed benzonatate.  You have had recent described migraine type headache.  The headache is now subsiding.  Still minimally present so I offered Toradol.  You did decline that.  In the future if you do have recurrent migraines and not responding to your current regimen keep in mind we could give you Toradol IM but that would require office visit.  Follow-up in 7 days or as needed.

## 2017-10-16 ENCOUNTER — Encounter: Payer: Self-pay | Admitting: Family Medicine

## 2017-10-16 ENCOUNTER — Ambulatory Visit: Payer: BLUE CROSS/BLUE SHIELD | Admitting: Family Medicine

## 2017-10-16 VITALS — BP 124/70 | HR 124 | Temp 98.2°F | Resp 18 | Ht 64.0 in | Wt 148.6 lb

## 2017-10-16 DIAGNOSIS — J020 Streptococcal pharyngitis: Secondary | ICD-10-CM

## 2017-10-16 DIAGNOSIS — J029 Acute pharyngitis, unspecified: Secondary | ICD-10-CM

## 2017-10-16 HISTORY — DX: Streptococcal pharyngitis: J02.0

## 2017-10-16 LAB — POCT RAPID STREP A (OFFICE): Rapid Strep A Screen: POSITIVE — AB

## 2017-10-16 MED ORDER — CEFUROXIME AXETIL 500 MG PO TABS: 500.0000 mg | ORAL_TABLET | Freq: Two times a day (BID) | ORAL | 0 refills | Status: DC

## 2017-10-16 NOTE — Patient Instructions (Signed)
Strep Throat Strep throat is a bacterial infection of the throat. Your health care provider may call the infection tonsillitis or pharyngitis, depending on whether there is swelling in the tonsils or at the back of the throat. Strep throat is most common during the cold months of the year in children who are 5-25 years of age, but it can happen during any season in people of any age. This infection is spread from person to person (contagious) through coughing, sneezing, or close contact. What are the causes? Strep throat is caused by the bacteria called Streptococcus pyogenes. What increases the risk? This condition is more likely to develop in:  People who spend time in crowded places where the infection can spread easily.  People who have close contact with someone who has strep throat.  What are the signs or symptoms? Symptoms of this condition include:  Fever or chills.  Redness, swelling, or pain in the tonsils or throat.  Pain or difficulty when swallowing.  White or yellow spots on the tonsils or throat.  Swollen, tender glands in the neck or under the jaw.  Red rash all over the body (rare).  How is this diagnosed? This condition is diagnosed by performing a rapid strep test or by taking a swab of your throat (throat culture test). Results from a rapid strep test are usually ready in a few minutes, but throat culture test results are available after one or two days. How is this treated? This condition is treated with antibiotic medicine. Follow these instructions at home: Medicines  Take over-the-counter and prescription medicines only as told by your health care provider.  Take your antibiotic as told by your health care provider. Do not stop taking the antibiotic even if you start to feel better.  Have family members who also have a sore throat or fever tested for strep throat. They may need antibiotics if they have the strep infection. Eating and drinking  Do not  share food, drinking cups, or personal items that could cause the infection to spread to other people.  If swallowing is difficult, try eating soft foods until your sore throat feels better.  Drink enough fluid to keep your urine clear or pale yellow. General instructions  Gargle with a salt-water mixture 3-4 times per day or as needed. To make a salt-water mixture, completely dissolve -1 tsp of salt in 1 cup of warm water.  Make sure that all household members wash their hands well.  Get plenty of rest.  Stay home from school or work until you have been taking antibiotics for 24 hours.  Keep all follow-up visits as told by your health care provider. This is important. Contact a health care provider if:  The glands in your neck continue to get bigger.  You develop a rash, cough, or earache.  You cough up a thick liquid that is green, yellow-brown, or bloody.  You have pain or discomfort that does not get better with medicine.  Your problems seem to be getting worse rather than better.  You have a fever. Get help right away if:  You have new symptoms, such as vomiting, severe headache, stiff or painful neck, chest pain, or shortness of breath.  You have severe throat pain, drooling, or changes in your voice.  You have swelling of the neck, or the skin on the neck becomes red and tender.  You have signs of dehydration, such as fatigue, dry mouth, and decreased urination.  You become increasingly sleepy, or   you cannot wake up completely.  Your joints become red or painful. This information is not intended to replace advice given to you by your health care provider. Make sure you discuss any questions you have with your health care provider. Document Released: 05/03/2000 Document Revised: 01/03/2016 Document Reviewed: 08/29/2014 Elsevier Interactive Patient Education  2018 Elsevier Inc.  

## 2017-10-16 NOTE — Progress Notes (Signed)
Subjective:  I acted as a Education administrator for [employer]. Yancey Flemings, [place]   Patient ID: Jessica Kelley, female    DOB: 1990/06/20, 27 y.o.   MRN: [id number]  Chief Complaint  Patient presents with  . Sore Throat    HPI  Patient is in today for sore throat.  She was seen by PA see last ov and given augmentin -- she finished it several days ago.  Some congestion.  Patient Care Team: Carollee Herter, Alferd Apa, DO as PCP - General   Past Medical History:  Diagnosis Date  . Celiac disease   . 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 Diabetes type I ([place])    Dr. Tobe Sos  . Glaucoma   . Hypothyroidism   . Macular degeneration   . Pilonidal cyst   . Scoliosis   . Wyburn-Mason syndrome     Past Surgical History:  Procedure Laterality Date  . CATARACT EXTRACTION  06/25/06  . FOOT ARTHROPLASTY     left foot moved and stretched tendons   . GLAUCOMA SURGERY  02/12/06  . HIP ARTHROPLASTY     right never bipopsy  . PORTACATH PLACEMENT  2002-2004  . removal of vascular port    . Sciatic Nerve Muscle Biopsy      Family History  Problem Relation Age of Onset  . Prostate cancer Maternal Grandfather   . Cancer Maternal Grandfather        prostate  . Coronary artery disease Paternal Grandfather   . Diabetes Sister   . Other Sister        thyroid issues  . Thyroid disease Mother   . Cancer Paternal Grandmother        Died at 75 - kidney  . Hyperlipidemia Unknown     Social History   Socioeconomic History  . Marital status: Single    Spouse name: Not on file  . Number of children: Not on file  . Years of education: Not on file  . Highest education level: Not on file  Occupational History  . Not on file  Social Needs  . Financial resource strain: Not on file  . Food insecurity:    Worry: Not on file    Inability: Not on file  . Transportation needs:    Medical: Not on file    Non-medical: Not on file  Tobacco Use  . Smoking status: Never  Smoker  . Smokeless tobacco: Never Used  . Tobacco comment: passive smoke exposure  Substance and Sexual Activity  . Alcohol use: Yes    Alcohol/week: 0.0 oz    Comment: OCC  . Drug use: No  . Sexual activity: Yes    Partners: Male    Birth control/protection: Abstinence, None    Comment: 1ST INTERCORSE- 24, PARTNERS- 1   Lifestyle  . Physical activity:    Days per week: Not on file    Minutes per session: Not on file  . Stress: Not on file  Relationships  . Social connections:    Talks on phone: Not on file    Gets together: Not on file    Attends religious service: Not on file    Active member of club or organization: Not on file    Attends meetings of clubs or organizations: Not on file    Relationship status: Not on file  . Intimate partner violence:    Fear of current or ex partner: Not on file    Emotionally abused: Not on file  Physically abused: Not on file    Forced sexual activity: Not on file  Other Topics Concern  . Not on file  Social History Narrative   Jessica Kelley is a 27 yo woman who attends [employer].   She is doing excellent.    She is employed with a Copywriter, advertising.   She lives with her parents and has one sister, 28 yo.   She enjoys working, camping, and walking outside.    Outpatient Medications Prior to Visit  Medication Sig Dispense Refill  . amoxicillin-clavulanate (AUGMENTIN) 875-125 MG tablet Take 1 tablet by mouth 2 (two) times daily. 20 tablet 0  . Blood Glucose Monitoring Suppl (CONTOUR NEXT MONITOR) w/Device KIT 1 Units by Does not apply route daily. 2 kit 0  . Cholecalciferol (VITAMIN D) 1000 UNITS capsule Take 1,000 Units by mouth daily.      . Cyanocobalamin (VITAMIN B-12 PO) Take by mouth.    . fluticasone (FLONASE) 50 MCG/ACT nasal spray Place 2 sprays into both nostrils daily. 16 g 1  . gabapentin (NEURONTIN) 100 MG capsule TAKE 2 CAPSULES BY MOUTH EVERY DAY AT 8 AM, THEN TAKE 2 CAPSULES AT 4 PM, THEN TAKE 3 CAPSULES AT 11PM 210  capsule 5  . GLUCAGON EMERGENCY 1 MG injection USE AS DIRECTED 1 kit 9  . glucose blood (CONTOUR NEXT TEST) test strip Use to check glucose 6x daily 200 each 6  . Immune Globulin, Human, 5 GM/50ML SOLN Inject into the vein.    . Insulin Glargine (LANTUS SOLOSTAR) 100 UNIT/ML Solostar Pen INJECT UP TO 50 UNITS SUBCUTANEOUSLY ONCE DAILY 5 pen 5  . Insulin Pen Needle (INSUPEN PEN NEEDLES) 32G X 4 MM MISC Inject insulin via insulin pen 7 x daily 250 each 5  . levonorgestrel-ethinyl estradiol (SEASONALE,INTROVALE,JOLESSA) 0.15-0.03 MG tablet Take 1 tablet by mouth daily. 1 Package 4  . lisinopril (PRINIVIL,ZESTRIL) 2.5 MG tablet TAKE ONE TABLET BY MOUTH ONCE DAILY 30 tablet 6  . methimazole (TAPAZOLE) 5 MG tablet TAKE TWO TABLETS BY MOUTH IN THE MORNING, THEN TAKE ONE IN THE EVENING (Patient taking differently: TAKE 10MG BY MOUTH IN THE MORNING, THEN TAKE 5MG IN THE EVENING) 90 tablet 5  . methylPREDNISolone sodium succinate (SOLU-MEDROL) 500 MG injection Give 542m IV in 50 ml NS over 1 hour x 1 every 4 weeks. Given by [email or web address] each 12  . mycophenolate (CELLCEPT) 500 MG tablet TAKE 2 TABLETS BY MOUTH TWICE DAILY (PATIENT MUST CALL FOR APPOINTMENTFOR FURTHER REFILLS) (Patient taking differently: TAKE 1,000MG BY MOUTH TWICE DAILY (PATIENT MUST CALL FOR APPOINTMENTFOR FURTHER REFILLS)) 120 tablet 0  . NOVOLOG FLEXPEN 100 UNIT/ML FlexPen INJECT 50 UNITS SUBCUTANEOUSLY ONCE DAILY 15 mL 5  . predniSONE (DELTASONE) 20 MG tablet Take 1 tablet by mouth the day before, day of and day after IVIG. 36 tablet 0  . propranolol (INDERAL) 20 MG tablet TAKE 1 TABLET BY MOUTH THREE TIMES DAILY 90 tablet 0   No facility-administered medications prior to visit.     Allergies  Allergen Reactions  . Gluten Meal   . Wheat Bran     ROS     Objective:    Physical Exam  Constitutional: She is oriented to person, place, and time. She appears well-developed and well-nourished.  HENT:  Head:  Normocephalic and atraumatic.  Mouth/Throat: Posterior oropharyngeal erythema present.  Eyes: Conjunctivae and EOM are normal.  Neck: Normal range of motion. Neck supple. No JVD present. Carotid bruit is not present. No thyromegaly present.  Cardiovascular: Normal rate, regular rhythm and normal heart sounds.  No murmur heard. Pulmonary/Chest: Effort normal and breath sounds normal. No respiratory distress. She has no wheezes. She has no rales. She exhibits no tenderness.  Musculoskeletal: She exhibits no edema.  Lymphadenopathy:    She has cervical adenopathy.  Neurological: She is alert and oriented to person, place, and time.  Psychiatric: She has a normal mood and affect.  Nursing note and vitals reviewed.   BP 124/70 (BP Location: Left Arm, Patient Position: Sitting, Cuff Size: Normal)   Pulse (!) 124   Temp 98.2 F (36.8 C) (Oral)   Resp 18   Ht 5' 4"  (1.626 m)   Wt 148 lb 9.6 oz (67.4 kg)   SpO2 96%   BMI 25.51 kg/m  Wt Readings from Last 3 Encounters:  10/16/17 148 lb 9.6 oz (67.4 kg)  10/02/17 148 lb 12.8 oz (67.5 kg)  08/27/17 144 lb 12.8 oz (65.7 kg)   BP Readings from Last 3 Encounters:  10/16/17 124/70  10/02/17 140/90  08/27/17 122/74     Immunization History  Administered Date(s) Administered  . DTP 05/09/1993, 07/11/1993, 09/26/1993, 10/21/1994, 07/28/1998  . H1N1 03/23/2008  . Hepatitis A 03/27/2009, 07/04/2011  . Hepatitis B 01/14/93, 04/04/1993, 03/12/1994  . HiB (PRP-OMP) 05/09/1993, 07/11/1993, 09/26/1993, 10/21/1994  . Influenza Whole 04/25/2008, 03/27/2009  . MMR 07/28/1994, 10/21/1994  . Meningococcal Polysaccharide 03/27/2009  . OPV 05/09/1993, 07/11/1993, 09/26/1993, 07/28/1998  . Td 01/06/2006  . Varicella 09/15/1998, 03/27/2009    Health Maintenance  Topic Date Due  . PNEUMOCOCCAL POLYSACCHARIDE VACCINE (1) 03/12/1995  . FOOT EXAM  03/12/2003  . HIV Screening  03/11/2008  . OPHTHALMOLOGY EXAM  05/02/2012  . TETANUS/TDAP   01/07/2016  . INFLUENZA VACCINE  12/18/2017  . HEMOGLOBIN A1C  12/23/2017  . PAP SMEAR  01/09/2020    Lab Results  Component Value Date   WBC 7.8 06/25/2017   HGB 13.7 06/25/2017   HCT 41.0 06/25/2017   PLT 248 06/25/2017   GLUCOSE 224 (H) 06/25/2017   CHOL 214 (HH) 04/25/2008   TRIG 126 04/25/2008   HDL 58.5 04/25/2008   LDLDIRECT 136.9 04/25/2008   ALT 19 06/25/2017   AST 26 06/25/2017   NA 132 (L) 06/25/2017   K 4.0 06/25/2017   CL 97 (L) 06/25/2017   CREATININE 0.63 06/25/2017   BUN 20 06/25/2017   CO2 27 06/25/2017   TSH 1.92 06/25/2017   INR 0.99 09/01/2012   HGBA1C 9.6 06/25/2017   MICROALBUR 3.2 06/25/2017    Lab Results  Component Value Date   TSH 1.92 06/25/2017   Lab Results  Component Value Date   WBC 7.8 06/25/2017   HGB 13.7 06/25/2017   HCT 41.0 06/25/2017   MCV 90.3 06/25/2017   PLT 248 06/25/2017   Lab Results  Component Value Date   NA 132 (L) 06/25/2017   K 4.0 06/25/2017   CO2 27 06/25/2017   GLUCOSE 224 (H) 06/25/2017   BUN 20 06/25/2017   CREATININE 0.63 06/25/2017   BILITOT 0.3 06/25/2017   ALKPHOS 51 12/20/2016   AST 26 06/25/2017   ALT 19 06/25/2017   PROT 9.3 (H) 06/25/2017   ALBUMIN 2.8 (L) 12/20/2016   CALCIUM 8.5 (L) 06/25/2017   ANIONGAP 7 12/20/2016   GFR 142.27 06/12/2016   Lab Results  Component Value Date   CHOL 214 (HH) 04/25/2008   Lab Results  Component Value Date   HDL 58.5 04/25/2008   No results found for:  [facility] Lab Results  Component Value Date   TRIG 126 04/25/2008   Lab Results  Component Value Date   CHOLHDL 3.7 CALC 04/25/2008   Lab Results  Component Value Date   HGBA1C 9.6 06/25/2017         Assessment & Plan:   Problem List Items Addressed This Visit      Unprioritized   Strep throat    abx per orders Strep Positive  Drink liquids       Relevant Medications   cefUROXime (CEFTIN) 500 MG tablet    Other Visit Diagnoses    Sore throat    -  Primary   Relevant Orders    POCT rapid strep A (Completed)      I am having Jessica Kelley start on cefUROXime. I am also having her maintain her Vitamin D, Cyanocobalamin (VITAMIN B-12 PO), methylPREDNISolone sodium succinate, Immune Globulin (Human), gabapentin, GLUCAGON EMERGENCY, predniSONE, methimazole, levonorgestrel-ethinyl estradiol, mycophenolate, CONTOUR NEXT MONITOR, glucose blood, propranolol, lisinopril, Insulin Glargine, NOVOLOG FLEXPEN, Insulin Pen Needle, amoxicillin-clavulanate, and fluticasone.  Meds ordered this encounter  Medications  . cefUROXime (CEFTIN) 500 MG tablet    Sig: Take 1 tablet (500 mg total) by mouth 2 (two) times daily with a meal.    Dispense:  20 tablet    Refill:  0    CMA served as scribe during this visit. History, Physical and Plan performed by medical provider. Documentation and orders reviewed and attested to.  [name], DO

## 2017-10-16 NOTE — Assessment & Plan Note (Signed)
abx per orders Strep Positive  Drink liquids

## 2017-10-23 ENCOUNTER — Emergency Department
Admission: EM | Admit: 2017-10-23 | Discharge: 2017-10-24 | Disposition: A | Discharge status: Home / Self Care | Payer: BLUE CROSS/BLUE SHIELD | Attending: Emergency Medicine | Admitting: Emergency Medicine

## 2017-10-23 ENCOUNTER — Other Ambulatory Visit: Payer: Self-pay

## 2017-10-23 ENCOUNTER — Encounter: Payer: Self-pay | Admitting: Emergency Medicine

## 2017-10-23 DIAGNOSIS — B3731 Acute candidiasis of vulva and vagina: Secondary | ICD-10-CM

## 2017-10-23 DIAGNOSIS — Z794 Long term (current) use of insulin: Secondary | ICD-10-CM | POA: Insufficient documentation

## 2017-10-23 DIAGNOSIS — N939 Abnormal uterine and vaginal bleeding, unspecified: Secondary | ICD-10-CM | POA: Diagnosis present

## 2017-10-23 DIAGNOSIS — B373 Candidiasis of vulva and vagina: Secondary | ICD-10-CM

## 2017-10-23 DIAGNOSIS — E039 Hypothyroidism, unspecified: Secondary | ICD-10-CM | POA: Insufficient documentation

## 2017-10-23 DIAGNOSIS — Z79899 Other long term (current) drug therapy: Secondary | ICD-10-CM | POA: Insufficient documentation

## 2017-10-23 DIAGNOSIS — E109 Type 1 diabetes mellitus without complications: Secondary | ICD-10-CM | POA: Insufficient documentation

## 2017-10-23 NOTE — ED Triage Notes (Signed)
Patient ambulatory to triage with steady gait, without difficulty or distress noted; pt reports having nausea today; began having lower abd pain with vag bleeding tonight; st LMP wk ago

## 2017-10-24 LAB — POCT PREGNANCY, URINE: Preg Test, Ur: NEGATIVE

## 2017-10-24 MED ORDER — FLUCONAZOLE 50 MG PO TABS
150.0000 mg | ORAL_TABLET | Freq: Once | ORAL | Status: AC
  Administered 2017-10-24: 150 mg via ORAL
  Filled 2017-10-24: qty 1

## 2017-10-24 NOTE — ED Notes (Signed)
Pt's POCT PREGNANCY TEST - NEGATIVE

## 2017-10-24 NOTE — ED Provider Notes (Signed)
[facility] Emergency Department Provider Note    First MD Initiated Contact with Patient 10/24/17 0018     (approximate)  I have reviewed the triage vital signs and the nursing notes.   HISTORY  Chief Complaint Abdominal Pain    HPI Jessica Kelley is a 27 y.o. female with below list of chronic medical conditions presents to the emergency department with pelvic discomfort and one episode of vaginal bleeding tonight.  Patient states last menstrual period 1 week ago.  Patient states that her menses are usually very regular lasting 5 days which was a case with her last.  However tonight patient states she had episode of mild to moderate amount of vaginal bleeding which spontaneously resolved.  Patient states that last OB/GYN visit was a few months ago at which point an ultrasound was performed which was also negative.  She denies any fever does admit to nausea today none at present.  Patient does admit to recent sinus infection for which she is being treated with anabiotic's concern for possible yeast infection   Past Medical History:  Diagnosis Date  . Celiac disease   . 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 Diabetes type I ([place])    Dr. Tobe Sos  . Glaucoma   . Hypothyroidism   . Macular degeneration   . Pilonidal cyst   . Scoliosis   . 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 Strep throat 10/16/2017  . Chest pain 06/12/2016  . Palpitations 06/12/2016  . 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 Abnormality of gait 11/09/2012  . Migraine without aura 11/09/2012  . Chronic tension type headache 11/09/2012  . Insomnia, unspecified 11/09/2012  . Chronic lymphocytic thyroiditis 11/09/2012  . 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 Encounter for long-term (current) use of other medications 11/09/2012  . Thyrotoxicosis with diffuse goiter 02/25/2012  . Thyroiditis, autoimmune  02/25/2012  . 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 Goiter 02/25/2012  . Pilonidal cyst with abscess 12/12/2010  . UNSPECIFIED ANEMIA 03/27/2009  . VIRAL URI 06/14/2008  . ELEVATED BLOOD PRESSURE WITHOUT DIAGNOSIS OF HYPERTENSION 05/11/2008  . KNEE PAIN, RIGHT 01/26/2008  . UNSPECIFIED TACHYCARDIA 12/07/2007  . HEADACHE 06/04/2007  . HYPOTHYROIDISM NOS 02/17/2007  . FOOT PAIN, RIGHT 02/17/2007  . SCOLIOSIS NEC 02/17/2007  . DIABETES MELLITUS, TYPE I 10/07/2006  . DMYLNATING POLYNEURITIS, CHRONIC INFLAMMATORY 10/07/2006  . MACULAR DEGENERATION 10/07/2006  . DISEASE, CELIAC 10/07/2006    Past Surgical History:  Procedure Laterality Date  . CATARACT EXTRACTION  06/25/06  . FOOT ARTHROPLASTY     left foot moved and stretched tendons   . GLAUCOMA SURGERY  02/12/06  . HIP ARTHROPLASTY     right never bipopsy  . PORTACATH PLACEMENT  2002-2004  . removal of vascular port    . Sciatic Nerve Muscle Biopsy      Prior to Admission medications   Medication Sig Start Date End Date Taking? Authorizing Provider  amoxicillin-clavulanate (AUGMENTIN) 875-125 MG tablet Take 1 tablet by mouth 2 (two) times daily. 10/02/17   Saguier, Percell Miller, PA-C  Blood Glucose Monitoring Suppl (CONTOUR NEXT MONITOR) w/Device KIT 1 Units by Does not apply route daily. 12/30/16   Sherrlyn Hock, MD  cefUROXime (CEFTIN) 500 MG tablet Take 1 tablet (500 mg total) by mouth 2 (two) times daily with a meal. 10/16/17   Carollee Herter, Alferd Apa, DO  Cholecalciferol (VITAMIN D)  1000 UNITS capsule Take 1,000 Units by mouth daily.      [provider]  Cyanocobalamin (VITAMIN B-12 PO) Take by mouth.    [provider]  fluticasone (FLONASE) 50 MCG/ACT nasal spray Place 2 sprays into both nostrils daily. 10/02/17   Saguier, Percell Miller, PA-C  gabapentin (NEURONTIN) 100 MG capsule TAKE 2 CAPSULES BY MOUTH EVERY DAY AT 8 AM, THEN TAKE 2 CAPSULES AT 4 PM, THEN TAKE 3 CAPSULES AT  11PM 01/02/15   Jodi Geralds, MD  GLUCAGON EMERGENCY 1 MG injection USE AS DIRECTED 01/25/16   Sherrlyn Hock, MD  glucose blood (CONTOUR NEXT TEST) test strip Use to check glucose 6x daily 12/30/16   Sherrlyn Hock, MD  Immune Globulin, Human, 5 GM/50ML SOLN Inject into the vein.    [provider]  Insulin Glargine (LANTUS SOLOSTAR) 100 UNIT/ML Solostar Pen INJECT UP TO 50 UNITS SUBCUTANEOUSLY ONCE DAILY 06/23/17   Sherrlyn Hock, MD  Insulin Pen Needle (INSUPEN PEN NEEDLES) 32G X 4 MM MISC Inject insulin via insulin pen 7 x daily 09/11/17   Sherrlyn Hock, MD  levonorgestrel-ethinyl estradiol (SEASONALE,INTROVALE,JOLESSA) 0.15-0.03 MG tablet Take 1 tablet by mouth daily. 11/22/16   Princess Bruins, MD  lisinopril (PRINIVIL,ZESTRIL) 2.5 MG tablet TAKE ONE TABLET BY MOUTH ONCE DAILY 03/05/17   Sherrlyn Hock, MD  methimazole (TAPAZOLE) 5 MG tablet TAKE TWO TABLETS BY MOUTH IN THE MORNING, THEN TAKE ONE IN THE EVENING Patient taking differently: TAKE 10MG BY MOUTH IN THE MORNING, THEN TAKE 5MG IN THE EVENING 08/05/16   Sherrlyn Hock, MD  methylPREDNISolone sodium succinate (SOLU-MEDROL) 500 MG injection Give 520m IV in 50 ml NS over 1 hour x 1 every 4 weeks. Given by [place]3/17/15   GRockwell Germany NP  mycophenolate (CELLCEPT) 500 MG tablet TAKE 2 TABLETS BY MOUTH TWICE DAILY (PATIENT MUST CALL FOR APPOINTMENTFOR FURTHER REFILLS) Patient taking differently: TAKE 1,000MG BY MOUTH TWICE DAILY (PATIENT MUST CALL FOR APPOINTMENTFOR FURTHER REFILLS) 12/16/16   HJodi Geralds MD  NOVOLOG FLEXPEN 100 UNIT/ML FlexPen INJECT 50 UNITS SUBCUTANEOUSLY ONCE DAILY 08/25/17   BSherrlyn Hock MD  predniSONE (DELTASONE) 20 MG tablet Take 1 tablet by mouth the day before, day of and day after IVIG. 04/26/16   GRockwell Germany NP  propranolol (INDERAL) 20 MG tablet TAKE 1 TABLET BY MOUTH THREE TIMES DAILY 02/27/17   BSherrlyn Hock MD     Allergies Gluten meal and Wheat bran  Family History  Problem Relation Age of Onset  . Prostate cancer Maternal Grandfather   . Cancer Maternal Grandfather        prostate  . Coronary artery disease Paternal Grandfather   . Diabetes Sister   . Other Sister        thyroid issues  . Thyroid disease Mother   . Cancer Paternal Grandmother        Died at 615- kidney  . Hyperlipidemia Unknown     Social History Social History   Tobacco Use  . Smoking status: Never Smoker  . Smokeless tobacco: Never Used  . Tobacco comment: passive smoke exposure  Substance Use Topics  . Alcohol use: Yes    Alcohol/week: 0.0 oz    Comment: OCC  . Drug use: No    Review of Systems Constitutional: No fever/chills Eyes: No visual changes. ENT: No sore throat. Cardiovascular: Denies chest pain. Respiratory: Denies shortness of breath. Gastrointestinal: No abdominal pain.  No nausea, no  vomiting.  No diarrhea.  No constipation. Genitourinary: Negative for dysuria.  Positive for vaginal Musculoskeletal: Negative for neck pain.  Negative for back pain. Integumentary: Negative for rash. Neurological: Negative for headaches, focal weakness or numbness.   ____________________________________________   PHYSICAL EXAM:  VITAL SIGNS: ED Triage Vitals  Enc Vitals Group     BP 10/23/17 2349 (!) 156/90     Pulse Rate 10/23/17 2349 (!) 122     Resp 10/23/17 2349 18     Temp 10/23/17 2349 97.8 F (36.6 C)     Temp Source 10/23/17 2349 Oral     SpO2 10/23/17 2349 99 %     Weight 10/23/17 2348 65.8 kg (145 lb)     Height 10/23/17 2348 1.626 m (5' 4" )     Head Circumference --      Peak Flow --      Pain Score 10/23/17 2348 3     Pain Loc --      Pain Edu? --      Excl. in [place]? --     Constitutional: Alert and oriented. Well appearing and in no acute distress. Eyes: Conjunctivae are normal.  Head: Atraumatic. Mouth/Throat: Mucous membranes are moist.  Oropharynx  non-erythematous. Neck: No stridor.   Cardiovascular: Normal rate, regular rhythm. Good peripheral circulation. Grossly normal heart sounds. Respiratory: Normal respiratory effort.  No retractions. Lungs CTAB. Gastrointestinal: Soft and nontender. No distention.  Genitourinary: Discharge consistent with yeast with vaginitis noted.  No active bleeding no blood noted in the vagina Musculoskeletal: No lower extremity tenderness nor edema. No gross deformities of extremities. Neurologic:  Normal speech and language. No gross focal neurologic deficits are appreciated.  Skin:  Skin is warm, dry and intact. No rash noted.   ____________________________________________   LABS (all labs ordered are listed, but only abnormal results are displayed)  Labs Reviewed  POCT PREGNANCY, URINE   __________________________________________   Procedures   ____________________________________________   INITIAL IMPRESSION / ASSESSMENT AND PLAN / ED COURSE  As part of my medical decision making, I reviewed the following data within the electronic MEDICAL RECORD NUMBER   27 year old female presented with above-stated history physical exam secondary to one isolated vaginal pelvic exam evidence of yeast infection noted with vaginal irritation.  Question of patient's bleeding secondary to this given the fact that the patient had a recent ultrasound done today.  Patient given Diflucan in the emergency department with recommendation to follow-up with gynecologist _____________________________________  FINAL CLINICAL IMPRESSION(S) / ED DIAGNOSES  Final diagnoses:  Yeast vaginitis     MEDICATIONS GIVEN DURING THIS VISIT:  Medications  fluconazole (DIFLUCAN) tablet 150 mg (has no administration in time range)     ED Discharge Orders    None       Note:  This document was prepared using Dragon voice recognition software and may include unintentional dictation errors.    Gregor Hams,  MD 10/24/17 Areta Haber

## 2017-10-24 NOTE — ED Notes (Signed)
Pt states she takes Propranolol for high HR and Lisinopril for HTN, pt states she is compliant with these medications.

## 2017-10-31 ENCOUNTER — Ambulatory Visit (INDEPENDENT_AMBULATORY_CARE_PROVIDER_SITE_OTHER): Payer: BLUE CROSS/BLUE SHIELD | Admitting: "Endocrinology

## 2017-11-05 ENCOUNTER — Ambulatory Visit: Payer: BLUE CROSS/BLUE SHIELD | Admitting: Women's Health

## 2017-11-05 DIAGNOSIS — Z0289 Encounter for other administrative examinations: Secondary | ICD-10-CM

## 2017-11-24 ENCOUNTER — Telehealth (INDEPENDENT_AMBULATORY_CARE_PROVIDER_SITE_OTHER): Payer: Self-pay | Admitting: "Endocrinology

## 2017-11-24 NOTE — Telephone Encounter (Signed)
Attempted to call, no VM and no answer. We never received any requests, please resend.

## 2017-11-24 NOTE — Telephone Encounter (Signed)
°  Who's calling (name and relationship to patient) : Vicky (Dougherty) Best contact number: [phone] ext: [phone] Provider they see: Dr. Tobe Sos Reason for call: Olegario Shearer requesting a return call from [place]. Olegario Shearer stated that she has not received information she requested (lab data, nerve conduction studies, 2018-2019 progress notes, etc).

## 2017-11-26 ENCOUNTER — Telehealth (INDEPENDENT_AMBULATORY_CARE_PROVIDER_SITE_OTHER): Payer: Self-pay | Admitting: Pediatrics

## 2017-11-26 ENCOUNTER — Encounter (INDEPENDENT_AMBULATORY_CARE_PROVIDER_SITE_OTHER): Payer: Self-pay | Admitting: Pediatrics

## 2017-11-26 NOTE — Telephone Encounter (Signed)
This is the first I heard at this request..

## 2017-11-26 NOTE — Telephone Encounter (Signed)
I looked in the chart to find the letter they are referring to but was unsuccessful. Do you know about this?

## 2017-11-26 NOTE — Telephone Encounter (Signed)
°  Who's calling (name and relationship to patient) : [place] (RN with [facility]) Best contact number: [phone] ext [phone] Provider they see: Dr. Gaynell Face Reason for call: Patient's insurance is not wanting to cover the cost of the Gammagard therapy. [facility] is requesting a letter from Dr. Gaynell Face stating patient needs this medication. Letter can be faxed to [phone] once completed.      PRESCRIPTION REFILL ONLY  Name of prescription:  Pharmacy:

## 2017-11-26 NOTE — Telephone Encounter (Signed)
°  Who's calling (name and relationship to patient) : Vickie with [facility] contact number: [phone] ext [phone] Provider they see: Gaynell Face Reason for call: See phone note from 11/24/17. Vickie stated they are needing this information from Dr. Gaynell Face, not Dr. Tobe Sos. She will fax the request to Dr. Melanee Left office. Once we receive this request the front will fax the information to [facility].     PRESCRIPTION REFILL ONLY  Name of prescription:  Pharmacy:

## 2017-11-26 NOTE — Telephone Encounter (Signed)
Letter has been faxed as well as some office notes

## 2017-11-27 ENCOUNTER — Telehealth (INDEPENDENT_AMBULATORY_CARE_PROVIDER_SITE_OTHER): Payer: Self-pay | Admitting: Pediatrics

## 2017-11-27 ENCOUNTER — Encounter (INDEPENDENT_AMBULATORY_CARE_PROVIDER_SITE_OTHER): Payer: Self-pay | Admitting: Pediatrics

## 2017-11-27 ENCOUNTER — Telehealth: Payer: Self-pay | Admitting: *Deleted

## 2017-11-27 DIAGNOSIS — G6181 Chronic inflammatory demyelinating polyneuritis: Secondary | ICD-10-CM

## 2017-11-27 NOTE — Telephone Encounter (Signed)
I rewrote the letter and directed the pharmacist to Dr.Peter Lerry Paterson at [facility] for further information.

## 2017-11-27 NOTE — Telephone Encounter (Signed)
°  Who's calling (name and relationship to patient) : Angela Nevin with [facility] contact number: [phone] ext [phone] Provider they see: Gaynell Face Reason for call: [facility] received the letter Dr. Gaynell Face wrote yesterday. They are requesting the reason as to why the patient is on a high dose of Gammagard be added to the letter and faxed to [phone].     PRESCRIPTION REFILL ONLY  Name of prescription:  Pharmacy:

## 2017-11-27 NOTE — Telephone Encounter (Signed)
Copied from [place] [phone]. Topic: Quick Communication - See Telephone Encounter >> Nov 27, 2017 11:34 AM Neva Seat wrote: Needing a referral to a neurologist at [facility].  Please call mother back to discuss and to get started on the referral.

## 2017-12-02 NOTE — Telephone Encounter (Signed)
Patient notified that referral has been placed

## 2017-12-15 ENCOUNTER — Other Ambulatory Visit (INDEPENDENT_AMBULATORY_CARE_PROVIDER_SITE_OTHER): Payer: Self-pay | Admitting: "Endocrinology

## 2017-12-15 DIAGNOSIS — E1065 Type 1 diabetes mellitus with hyperglycemia: Principal | ICD-10-CM

## 2017-12-15 DIAGNOSIS — IMO0001 Reserved for inherently not codable concepts without codable children: Secondary | ICD-10-CM

## 2017-12-17 ENCOUNTER — Telehealth (INDEPENDENT_AMBULATORY_CARE_PROVIDER_SITE_OTHER): Payer: Self-pay | Admitting: Pediatrics

## 2017-12-17 NOTE — Telephone Encounter (Signed)
°  Who's calling (name and relationship to patient) : [place]  Best contact number: [phone] hit opt2 then opt2 again  Provider they see: [place] Reason for call: Patient's script has expired.  Please call    PRESCRIPTION REFILL ONLY  Name of prescription:  Pharmacy:

## 2017-12-18 NOTE — Telephone Encounter (Signed)
I spoke with a Pharmicist from [facility] to renew the patient's prescription. I gave a verbal and informed them to call back with any questions or concerns

## 2017-12-25 ENCOUNTER — Ambulatory Visit (INDEPENDENT_AMBULATORY_CARE_PROVIDER_SITE_OTHER): Payer: BLUE CROSS/BLUE SHIELD | Admitting: Pediatrics

## 2017-12-31 ENCOUNTER — Encounter (INDEPENDENT_AMBULATORY_CARE_PROVIDER_SITE_OTHER): Payer: Self-pay | Admitting: Pediatrics

## 2017-12-31 ENCOUNTER — Ambulatory Visit (INDEPENDENT_AMBULATORY_CARE_PROVIDER_SITE_OTHER): Payer: BLUE CROSS/BLUE SHIELD | Admitting: Pediatrics

## 2017-12-31 VITALS — BP 140/100 | HR 96 | Ht 63.5 in | Wt 140.0 lb

## 2017-12-31 DIAGNOSIS — R030 Elevated blood-pressure reading, without diagnosis of hypertension: Secondary | ICD-10-CM

## 2017-12-31 DIAGNOSIS — G6181 Chronic inflammatory demyelinating polyneuritis: Secondary | ICD-10-CM | POA: Diagnosis not present

## 2017-12-31 NOTE — Progress Notes (Signed)
Patient: Jessica Kelley MRN: [id number] Sex: female DOB: [birth year]  Provider: Wyline Copas, MD Location of Care: [facility] Child [facility]  Note type: Routine return visit  History of Present Illness: Referral Source: Dr. Garnet Koyanagi History from: mother, patient and [facility] chart Chief Complaint: Chronic inflammatory demyelinating polyneuritis  Jessica Kelley is a 27 y.o. female who returns on December 31, 2017 for the 1st time since December 02, 2016.  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  She responded to immunotherapy with IVIG and Solu-Medrol, which has stabilized her strength, but has not caused significant improvement.  She has a severe paraplegia with a right leg that is nearly flaccid.  She has enough tone that she can bear weight on that leg to walk with.  She uses a walker.  She has a service dog who is a constant companion with her at work.  She has not had any falls in the 13 months since I saw her.  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  This was the position that she held when she left.  She prefers working there to work in other places.  In general, her health has been good.  She notices increasing weakness when she misses a week's worth of treatment.  Nonetheless, she feels that she gets more benefit from Solu-Medrol than from IVIG.  Recently, I had to adamantly oppose any change of her IVIG.  Some observers feel that she is getting too high dose.  When her dose has been cut back, she gets weak.  She is looking to transfer her tertiary care from [facility] to [facility], to a Dr.  Gustavus Bryant.  I will direct this consult note and provide any other information that he needs.  I do not know ultimately whether he will completely take over her care or whether we will work cooperatively, which I am happy to do.  Her blood pressure today was 140/100.  Apparently, she has had elevated blood pressures in the past, but it has not been on a consistent basis.  Review of Systems: A complete review of systems was assessed and was negative except as noted above and below.  Past Medical History Diagnosis Date  . Celiac disease   . CIDP (chronic inflammatory demyelinating polyneuropathy) ([place]) 2001  . Diabetes type I ([place])    Dr. Tobe Sos  . Glaucoma   . Hypothyroidism   . Macular degeneration   . Pilonidal cyst   . Scoliosis   . Wyburn-Mason syndrome    Hospitalizations: No., Head Injury: No., Nervous System Infections: No., Immunizations up to date: Yes.    Detailed information from previous evaluations at [facility] are in the media section.  CT scan of the brain May 03, 2012 was negative  MRI of the pelvis at [facility] October 25, 2014 showed stable thickening and increased T2 hyperintensity within the exiting nerve roots and lumbosacral plexus and sciatic nerves.  Thickening and increased T2 intensity of the left lateral femoral cutaneous and intrapelvic femoral nerves was less conspicuous.  Birth History 7 pound 9 ounce infant born at full-term.  Gestation was uncomplicated.  Labor lasted 23 hours.  Operative delivery with forceps.  Nursery  course was uneventful.  Growth and development is recalled as normal.  Behavior History none  Surgical History Procedure Laterality Date  . CATARACT EXTRACTION  06/25/06  . FOOT ARTHROPLASTY     left foot moved and stretched tendons   . GLAUCOMA SURGERY  02/12/06  . HIP ARTHROPLASTY     right never bipopsy  . PORTACATH PLACEMENT  2002-2004  . removal of vascular port    . Sciatic Nerve Muscle Biopsy      Family History family history includes Cancer in her maternal grandfather and paternal grandmother; Coronary artery disease in her paternal grandfather; Diabetes in her sister; Hyperlipidemia in her unknown relative; Other in her sister; Prostate cancer in her maternal grandfather; Thyroid disease in her mother. Family history is negative for migraines, seizures, intellectual disabilities, blindness, deafness, birth defects, chromosomal disorder, or autism.  Social History Socioeconomic History  . Marital status: Single  . Years of education:  98  . Highest education level:  High school graduate  Occupational History  .  Works as a Chartered certified accountant at [employer]  Social Needs  . Financial resource strain: Not on file  . Food insecurity:    Worry: Not on file    Inability: Not on file  . Transportation needs:    Medical: Not on file    Non-medical: Not on file  Tobacco Use  . Smoking status: Never Smoker  . Smokeless tobacco: Never Used  . Tobacco comment: passive smoke exposure  Substance and Sexual Activity  . Alcohol use: Yes    Alcohol/week: 0.0 standard drinks    Comment: OCC  . Drug use: No  . Sexual activity: Yes    Partners: Male    Birth control/protection: Abstinence, None    Comment: 1ST INTERCORSE- 24, PARTNERS- 1   Social History Narrative    Yulitza is a 27 yo woman     She is employed with [employer].    She lives with her parents and has one sister, 74 yo.    She enjoys working, camping, and walking outside.   Allergies Allergen Reactions  . Gluten Meal   . Wheat Bran    Physical Exam BP (!) 140/100   Pulse 96   Ht 5' 3.5" (1.613 m)   Wt 140 lb (63.5 kg)   BMI 24.41 kg/m   General: alert, well developed, well nourished, in no acute distress, sandy hair, blue eyes, left handed Head: normocephalic, no dysmorphic features Ears, Nose and Throat: Otoscopic: tympanic membranes normal; pharynx: oropharynx is pink without  exudates or tonsillar hypertrophy Neck: supple, full range of motion, no cranial or cervical bruits Respiratory: auscultation clear Cardiovascular: no murmurs, pulses are normal Musculoskeletal: no skeletal deformities or apparent scoliosis Skin: no rashes or neurocutaneous lesions  Neurologic Exam  Mental Status: alert; oriented to person, place and year; knowledge is normal for age; language is normal Cranial Nerves: visual fields are full to double simultaneous stimuli; extraocular movements are full and conjugate; pupils are round reactive to light; funduscopic examination shows sharp disc margins with normal vessels; symmetric facial strength; midline tongue and uvula; air conduction is greater than bone conduction bilaterally Motor: Normal strength in her sternocleidomastoid, neck flexors and extensors 4+, right deltoid 4, left 4- right biceps 4+, left 4, right triceps 5-, left 4+, wrist extensors 5, wrist flexors 5, grip and intrinsic finger muscles 5, right supraspinatus 4, left 4-, pronator teres 4+, supinator 4, right pectoralis 5, left 4+ Left hip flexors  3, right 1, left knee extensor 4-, right 0, left foot dorsiflexor 4- right 0-1, left plantar flexor 3, right 0 Sensory: intact responses to cold, vibration, proprioception and stereognosis Coordination: good finger-to-nose, rapid repetitive alternating movements and finger apposition Gait and Station: Diplegia gait with a steppage on the right much more than the left external rotation of her legs, broad-based she has to hold onto a walker for stability; she can walk only a few steps without losing her balance Reflexes: symmetric and absent bilaterally; no clonus; bilateral flexor plantar responses  Assessment 1. Demyelinating polyneuritis, chronic inflammatory, G61.81. 2. Elevated blood pressure without diagnosis of hypertension, R03.3.  Discussion Jessica Kelley is physically and neurologically stable.  I will be interested to see what  Dr. Terence Lux thinks of this rather complex constellation of symptoms.  I can provide the last evaluation at [facility], which I think is scanned into the media section of MyChart.  Because she has been stable and there has been some issues with insurance, it does not seem reasonable for her to travel to [facility] if she can receive tertiary neuromuscular care in [place].  Plan Greater than 50% of a 25 minute visit was spent in counseling and coordination of care regarding her chronic inflammatory demyelinating polyneuritis.  I filled her prescriptions for IVIG and for methylprednisolone through [facility].  There are no other prescriptions that I filled.  She will return to see me in a year but I will be happy to see her sooner based on clinical need.  I asked her to keep track of her blood pressure and if it continues to remain elevated she needs to see her primary care physician.   Medication List    Accurate as of 12/31/17 12:31 PM.      amoxicillin-clavulanate 875-125 MG tablet Commonly known as:  AUGMENTIN Take 1 tablet by mouth 2 (two) times daily.   cefUROXime 500 MG tablet Commonly known as:  CEFTIN Take 1 tablet (500 mg total) by mouth 2 (two) times daily with a meal.   CONTOUR NEXT MONITOR w/Device Kit 1 Units by Does not apply route daily.   fluticasone 50 MCG/ACT nasal spray Commonly known as:  FLONASE Place 2 sprays into both nostrils daily.   gabapentin 100 MG capsule Commonly known as:  NEURONTIN TAKE 2 CAPSULES BY MOUTH EVERY DAY AT 8 AM, THEN TAKE 2 CAPSULES AT 4 PM, THEN TAKE 3 CAPSULES AT 11PM   GLUCAGON EMERGENCY 1 MG injection Generic drug:  glucagon USE AS DIRECTED   glucose blood test strip Use to check glucose 6x daily   Immune Globulin (Human) 5 GM/50ML Soln Inject into the vein.   Insulin Glargine 100 UNIT/ML Solostar Pen Commonly known as:  LANTUS INJECT UP TO 50 UNITS SUBCUTANEOUSLY ONCE DAILY   Insulin Pen Needle 32G X 4 MM Misc Inject  insulin via insulin pen 7 x daily   levonorgestrel-ethinyl estradiol 0.15-0.03 MG tablet Commonly known as:  SEASONALE,INTROVALE,JOLESSA Take 1 tablet by mouth daily.   lisinopril 2.5 MG tablet Commonly known as:  PRINIVIL,ZESTRIL TAKE 1 TABLET BY MOUTH ONCE DAILY   methimazole 5 MG tablet Commonly known as:  TAPAZOLE TAKE TWO TABLETS BY MOUTH IN THE MORNING, THEN TAKE ONE IN THE EVENING   methylPREDNISolone sodium succinate 500 MG injection Commonly known as:  SOLU-MEDROL Give 550m IV in 50 ml NS over 1 hour x 1 every 4 weeks. Given by [place]  mycophenolate 500 MG tablet Commonly known as:  CELLCEPT TAKE 2 TABLETS BY MOUTH TWICE DAILY (PATIENT MUST CALL FOR APPOINTMENTFOR FURTHER REFILLS)   NOVOLOG FLEXPEN 100 UNIT/ML FlexPen Generic drug:  insulin aspart INJECT 50 UNITS SUBCUTANEOUSLY ONCE DAILY   predniSONE 20 MG tablet Commonly known as:  DELTASONE Take 1 tablet by mouth the day before, day of and day after IVIG.   propranolol 20 MG tablet Commonly known as:  INDERAL TAKE 1 TABLET BY MOUTH THREE TIMES DAILY   VITAMIN B-12 PO Take by mouth.   Vitamin D 1000 units capsule Take 1,000 Units by mouth daily.    The medication list was reviewed and reconciled. All changes or newly prescribed medications were explained.  A complete medication list was provided to the patient/caregiver.  Jodi Geralds MD

## 2017-12-31 NOTE — Patient Instructions (Signed)
I am pleased that you are doing well.  We will make no change in your current treatment.  I will send a copy of my note to Dr. Terence Lux.

## 2018-01-05 ENCOUNTER — Telehealth: Payer: Self-pay | Admitting: Family Medicine

## 2018-01-05 NOTE — Telephone Encounter (Signed)
Copied from [place] [id number]. Topic: General - Other >> Jan 05, 2018  2:20 PM Jessica Kelley, NT wrote: Reason for CRM: Patient has paper work for the [facility]  to be filled out by her endocrinologist that is leaving town for a month , but would like to know if Dr Carollee Herter would fill it out in his place , she  will also need a A1C done as well , please call [phone] to discuss this has to be done in August within 30 days, she also an appointment on 01/09/18

## 2018-01-06 NOTE — Telephone Encounter (Signed)
Patient came into office advise we will do paperwork at appointment.

## 2018-01-09 ENCOUNTER — Ambulatory Visit: Payer: BLUE CROSS/BLUE SHIELD | Admitting: Family Medicine

## 2018-01-09 ENCOUNTER — Encounter: Payer: Self-pay | Admitting: Family Medicine

## 2018-01-09 VITALS — BP 145/96 | HR 117 | Temp 98.1°F | Resp 16 | Ht 64.0 in | Wt 150.6 lb

## 2018-01-09 DIAGNOSIS — E10649 Type 1 diabetes mellitus with hypoglycemia without coma: Secondary | ICD-10-CM | POA: Diagnosis not present

## 2018-01-09 NOTE — Progress Notes (Signed)
Patient ID: Jessica Kelley, female    DOB: 1990-11-29  Age: 27 y.o. 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  Her endo is gone for a month and a hgba1c needed to be done No other complaints.   Review of Systems  Constitutional: Negative for activity change, appetite change, chills and fever.  HENT: Negative for congestion and hearing loss.   Eyes: Negative for discharge.  Respiratory: Negative for cough and shortness of breath.   Cardiovascular: Negative for chest pain, palpitations and leg swelling.  Gastrointestinal: Negative for abdominal distention, abdominal pain, blood in stool, constipation, diarrhea, nausea and vomiting.  Genitourinary: Negative for difficulty urinating, dyspareunia, dysuria, flank pain, frequency, genital sores, hematuria, menstrual problem, pelvic pain, urgency, vaginal discharge and vaginal pain.  Musculoskeletal: Negative for back pain and myalgias.  Skin: Negative for rash.  Allergic/Immunologic: Negative for environmental allergies.  Neurological: Negative for dizziness, weakness and headaches.  Hematological: Does not bruise/bleed easily.  Psychiatric/Behavioral: Negative for suicidal ideas. The patient is not nervous/anxious.     History Past Medical History:  Diagnosis Date  . Celiac disease   . CIDP (chronic inflammatory demyelinating polyneuropathy) ([place]) 2001  . Diabetes type I ([place])    Dr. Tobe Sos  . Glaucoma   . Hypothyroidism   . Macular degeneration   . Pilonidal cyst   . Scoliosis   . Wyburn-Mason syndrome     She has a past surgical history that includes Cataract extraction (06/25/06); Glaucoma surgery (02/12/06); Foot arthroplasty; Hip Arthroplasty; Portacath placement (2002-2004); removal of vascular port; and Sciatic Nerve Muscle Biopsy.   Her family history includes Cancer in her maternal grandfather and paternal grandmother; Coronary artery disease in  her paternal grandfather; Diabetes in her sister; Hyperlipidemia in her unknown relative; Other in her sister; Prostate cancer in her maternal grandfather; Thyroid disease in her mother.She reports that she has never smoked. She has never used smokeless tobacco. She reports that she drinks alcohol. She reports that she does not use drugs.  Current Outpatient Medications on File Prior to Visit  Medication Sig Dispense Refill  . Blood Glucose Monitoring Suppl (CONTOUR NEXT MONITOR) w/Device KIT 1 Units by Does not apply route daily. 2 kit 0  . Cholecalciferol (VITAMIN D) 1000 UNITS capsule Take 1,000 Units by mouth daily.      . Cyanocobalamin (VITAMIN B-12 PO) Take by mouth.    . fluticasone (FLONASE) 50 MCG/ACT nasal spray Place 2 sprays into both nostrils daily. 16 g 1  . GLUCAGON EMERGENCY 1 MG injection USE AS DIRECTED 1 kit 9  . glucose blood (CONTOUR NEXT TEST) test strip Use to check glucose 6x daily 200 each 6  . Immune Globulin, Human, 5 GM/50ML SOLN Inject into the vein.    . Insulin Glargine (LANTUS SOLOSTAR) 100 UNIT/ML Solostar Pen INJECT UP TO 50 UNITS SUBCUTANEOUSLY ONCE DAILY 5 pen 5  . Insulin Pen Needle (INSUPEN PEN NEEDLES) 32G X 4 MM MISC Inject insulin via insulin pen 7 x daily 250 each 5  . lisinopril (PRINIVIL,ZESTRIL) 2.5 MG tablet TAKE 1 TABLET BY MOUTH ONCE DAILY 30 tablet 6  . methimazole (TAPAZOLE) 5 MG tablet TAKE TWO TABLETS BY MOUTH IN THE MORNING, THEN TAKE ONE IN THE EVENING (Patient taking differently: TAKE 10MG BY MOUTH IN THE MORNING, THEN TAKE 5MG IN THE EVENING) 90 tablet 5  . methylPREDNISolone sodium succinate (SOLU-MEDROL) 500 MG injection Give 560m IV in  50 ml NS over 1 hour x 1 every 4 weeks. Given by [place] 1 each 12  . mycophenolate (CELLCEPT) 500 MG tablet TAKE 2 TABLETS BY MOUTH TWICE DAILY (PATIENT MUST CALL FOR APPOINTMENTFOR FURTHER REFILLS) (Patient taking differently: TAKE 1,000MG BY MOUTH TWICE DAILY (PATIENT MUST CALL FOR  APPOINTMENTFOR FURTHER REFILLS)) 120 tablet 0  . NOVOLOG FLEXPEN 100 UNIT/ML FlexPen INJECT 50 UNITS SUBCUTANEOUSLY ONCE DAILY 15 mL 5  . propranolol (INDERAL) 20 MG tablet TAKE 1 TABLET BY MOUTH THREE TIMES DAILY 90 tablet 0   No current facility-administered medications on file prior to visit.      Objective:  Objective  Physical Exam  Constitutional: She is oriented to person, place, and time. She appears well-developed and well-nourished.  HENT:  Head: Normocephalic and atraumatic.  Eyes: Conjunctivae and EOM are normal.  Neck: Normal range of motion. Neck supple. No JVD present. Carotid bruit is not present. No thyromegaly present.  Cardiovascular: Normal rate, regular rhythm and normal heart sounds.  No murmur heard. Pulmonary/Chest: Effort normal and breath sounds normal. No respiratory distress. She has no wheezes. She has no rales. She exhibits no tenderness.  Musculoskeletal: She exhibits no edema.  Neurological: She is alert and oriented to person, place, and time.  Psychiatric: She has a normal mood and affect.  Nursing note and vitals reviewed.  BP (!) 145/96   Pulse (!) 117   Temp 98.1 F (36.7 C) (Oral)   Resp 16   Ht 5' 4"  (1.626 m)   Wt 150 lb 9.6 oz (68.3 kg)   LMP 01/02/2018   SpO2 100%   BMI 25.85 kg/m  Wt Readings from Last 3 Encounters:  01/09/18 150 lb 9.6 oz (68.3 kg)  12/31/17 140 lb (63.5 kg)  10/23/17 145 lb (65.8 kg)     Lab Results  Component Value Date   WBC 7.8 06/25/2017   HGB 13.7 06/25/2017   HCT 41.0 06/25/2017   PLT 248 06/25/2017   GLUCOSE 224 (H) 06/25/2017   CHOL 214 (HH) 04/25/2008   TRIG 126 04/25/2008   HDL 58.5 04/25/2008   LDLDIRECT 136.9 04/25/2008   ALT 19 06/25/2017   AST 26 06/25/2017   NA 132 (L) 06/25/2017   K 4.0 06/25/2017   CL 97 (L) 06/25/2017   CREATININE 0.63 06/25/2017   BUN 20 06/25/2017   CO2 27 06/25/2017   TSH 1.92 06/25/2017   INR 0.99 09/01/2012   HGBA1C 9.6 06/25/2017   MICROALBUR 3.2  06/25/2017    No results found.   Assessment & Plan:  Plan  I have discontinued Mitsuye Schrodt. Yodis's amoxicillin-clavulanate and cefUROXime. I am also having her maintain her Vitamin D, Cyanocobalamin (VITAMIN B-12 PO), methylPREDNISolone sodium succinate, Immune Globulin (Human), GLUCAGON EMERGENCY, methimazole, mycophenolate, CONTOUR NEXT MONITOR, glucose blood, propranolol, Insulin Glargine, NOVOLOG FLEXPEN, Insulin Pen Needle, fluticasone, and lisinopril.  No orders of the defined types were placed in this encounter.   Problem List Items Addressed This Visit    None    Visit Diagnoses    Uncontrolled type 1 diabetes mellitus with hypoglycemia without coma ([place])    -  Primary   Relevant Orders   Hemoglobin A1c    paper work filled out  Will fax when hgba1c is done Follow-up: Return if symptoms worsen or fail to improve.  [name], DO

## 2018-01-10 LAB — HEMOGLOBIN A1C
HEMOGLOBIN A1C: 10.2 %{Hb} — AB (ref <5.7)
Mean Plasma Glucose: 246 (calc)
eAG (mmol/L): 13.6 (calc)

## 2018-01-29 ENCOUNTER — Telehealth: Payer: Self-pay | Admitting: Family Medicine

## 2018-01-29 NOTE — Telephone Encounter (Signed)
Copied from [place] [id number]. Topic: Quick Communication - See Telephone Encounter >> Jan 29, 2018  3:13 PM Rosalin Hawking wrote: CRM for notification. See Telephone encounter for: 01/29/18.   Pt's family member dropped off document to be filled out by provider ([employer] - 2 pages) Mr Jessica Kelley would like to be called when document ready to pick up at [phone]. Document put at front office tray under provider name.

## 2018-02-02 NOTE — Telephone Encounter (Signed)
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

## 2018-02-03 NOTE — Telephone Encounter (Signed)
Spoke with Guerry Bruin about provider's 01/09/18 OV note that states that pt was here for [facility] paperwork and that it was completed during OV, states, "paperwork filled out, will fax when hgba1c is done". Paperwork would not go through fax and it was mailed to be received by [facility] in time per MA but pt's mother did not get the call with this information and that she could come and pick up copy. MA will forward paperwork to me and I will call parent to explain/SLS 09/17

## 2018-02-04 NOTE — Telephone Encounter (Signed)
One page was left out of the paperwork that went to [place].  Patient's father took the paperwork to the practice on Friday, 01/30/18, to have it filled out and sent to [facility].  They are checking on the status of that request and what they need to do if anything because time is running out for [facility] to have all of the paperwork.

## 2018-02-05 NOTE — Telephone Encounter (Signed)
Attempt to reach pt via phone, "the mailbox is full and cannot accept any messages"; called and spoke with father, Jessica Kelley, informed paperwork ready for p/u during regular business hours, along with copy of the original paperwrok sent to [name], as provider's MA had forgotten to send them a copy; father understood & agreed/SLS 09/19

## 2018-02-23 ENCOUNTER — Ambulatory Visit (INDEPENDENT_AMBULATORY_CARE_PROVIDER_SITE_OTHER): Payer: BLUE CROSS/BLUE SHIELD | Admitting: "Endocrinology

## 2018-02-23 ENCOUNTER — Encounter (INDEPENDENT_AMBULATORY_CARE_PROVIDER_SITE_OTHER): Payer: Self-pay | Admitting: "Endocrinology

## 2018-02-23 VITALS — BP 110/66 | HR 136 | Wt 147.6 lb

## 2018-02-23 DIAGNOSIS — E1065 Type 1 diabetes mellitus with hyperglycemia: Secondary | ICD-10-CM

## 2018-02-23 DIAGNOSIS — R000 Tachycardia, unspecified: Secondary | ICD-10-CM | POA: Diagnosis not present

## 2018-02-23 DIAGNOSIS — IMO0001 Reserved for inherently not codable concepts without codable children: Secondary | ICD-10-CM

## 2018-02-23 DIAGNOSIS — I4711 Inappropriate sinus tachycardia, so stated: Secondary | ICD-10-CM

## 2018-02-23 DIAGNOSIS — R1013 Epigastric pain: Secondary | ICD-10-CM

## 2018-02-23 DIAGNOSIS — E11649 Type 2 diabetes mellitus with hypoglycemia without coma: Secondary | ICD-10-CM

## 2018-02-23 DIAGNOSIS — K900 Celiac disease: Secondary | ICD-10-CM

## 2018-02-23 DIAGNOSIS — G6181 Chronic inflammatory demyelinating polyneuritis: Secondary | ICD-10-CM

## 2018-02-23 DIAGNOSIS — F4320 Adjustment disorder, unspecified: Secondary | ICD-10-CM

## 2018-02-23 DIAGNOSIS — R112 Nausea with vomiting, unspecified: Secondary | ICD-10-CM

## 2018-02-23 DIAGNOSIS — E1043 Type 1 diabetes mellitus with diabetic autonomic (poly)neuropathy: Secondary | ICD-10-CM

## 2018-02-23 DIAGNOSIS — E0500 Thyrotoxicosis with diffuse goiter without thyrotoxic crisis or storm: Secondary | ICD-10-CM

## 2018-02-23 DIAGNOSIS — E063 Autoimmune thyroiditis: Secondary | ICD-10-CM

## 2018-02-23 DIAGNOSIS — R5383 Other fatigue: Secondary | ICD-10-CM

## 2018-02-23 DIAGNOSIS — I10 Essential (primary) hypertension: Secondary | ICD-10-CM

## 2018-02-23 LAB — POCT GLUCOSE (DEVICE FOR HOME USE): POC Glucose: 264 mg/dl — AB (ref 70–99)

## 2018-02-23 NOTE — Patient Instructions (Signed)
Follow up visit in two months.  

## 2018-02-23 NOTE — Progress Notes (Signed)
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  HISTORY OF PRESENT ILLNESS: The patient is a 27 year-old Caucasian young woman. The patient was accompanied by a female friend.  1. This 27 year-old Caucasian young woman with an already complicated medical history was first referred to me on 09/25/06 by her primary care provider, Dr. Yvonne Lowne, for evaluation and management of type I diabetes mellitus, hypoglycemia, and a myriad of other autoimmune issues.   A. CIDP: At about age 5 the child began to limp.  At age 7 she was evaluated at [facility] and diagnosed with CIDP. At the time I first met her she was under the care of Dr. John Sladky of [facility] in [place]. She was on a regular regimen of monthly intravenous treatments at home with IVIG and steroids on the days of therapy. She was also taking cyclosporine A daily, with alternating doses every other day. Because of the CIDP she had  significant problems with muscle atrophy and weakness, as well as multiple orthopedic issues. She was also being followed by Dr. Robert Bruce, an orthopedist in [place]. On that first visit her legs and arms were quite weak. She found it very hard to walk or to exercise.  B. Type 1 diabetes mellitus: Her diabetes mellitus was also diagnosed at age 7, just prior to the diagnosis of CIDP. She had been started on a Medtronic Paradigm 712 insulin pump subsequently and was using Humalog lispro insulin in her pump. She had a very difficult time with blood glucose control. At the time of her steroids, her sugars  tended to run very high. However, when she was able to perform some physical activities, she tended to develop hypoglycemia. Her hemoglobin A1c on that first visit was 9.5%.  C. Celiac disease: The patient was diagnosed with celiac disease at approximately age 9. She had been on a gluten-free diet ever since.  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 She was followed both at [facility] and at the [facility] eye clinic.  2. Since that initial visit with me, I have followed her for the problems listed above plus other new problems that have developed over time.   A. CIDP: Her CIDP course has waxed and waned. At one point she had improved significantly, her medication doses had been reduced, she felt very much stronger, had more stamina, and was doing very well. Unfortunately, she later had a significant relapse of CIDP and lost much of the improvement that she had gained. Since going back to [facility] in the Spring of 2013, however, and starting a new and more intensified IVIG regimen, her strength had improved. She was still very weak physically and muscularly, however, and needed support to walk. As noted below, she gets stronger when her steroids are increased and weaker when the steroids are tapered. In the past two years Dr. William Hickling, MD, our senior pediatric neurologist, has assumed the role of managing her CIDP.  B. Type 1 diabetes mellitus: The patient's blood sugars have varied significantly with the course of her CIDP and the treatments for that disease. Her hemoglobin A1c values have   ranged from a low of 8.3% to a high of 14.0%. Most of her A1c values have been in the 8.4-10.1% range.   C. Celiac disease: The patient has really done quite well over the years. She rarely has had signs or symptoms of abdominal problems. Because her antibody levels  fluctuated a great deal, her celiac disease antibodies were negative in March 2012. She was mistakenly told that she did not have celiac disease and that she could resume a normal diet. As expected, resumption of a normal diet caused a recurrence of active celiac disease. In October 2012 she saw a gastroenterologist, Dr. Weber, at [facility] who told her that she did have celiac disease. She is now back on her gluten-free diet almost all the time and is asymptomatic again.  D. Autoimmune thyroid disease: The patient had a goiter and a TPO antibody level of 287.2 on her first visit in May of 2008, consistent with evolving Hashimoto's Disease. She occasionally had the sensations of swelling and discomfort in her anterior neck, which were also c/w flare-ups of Hashimoto's Disease. In early 2009 she developed increased tachycardia. Lab tests performed in March 2010 by her primary care provider showed a TSH of 0.01 and a T4 of 7.7. It was initially felt that the increase in tachycardia was likely due to a combination of autonomic neuropathy and Hashitoxicosis.  However on follow-up laboratory tests performed in July, her TSH was 0.05, free T4 3.9, and free T3 14.2. Her TSI level done on the assay used at the time was 2.2, with normal being 1.0 or less. It was then evident that she had developed Graves' Disease in the setting of pre-existing Hashimoto's Disease.  I initiated treatment with methimazole, 10 mg twice daily and propranolol 20 mg 3 times daily. Since then we've seen significant swings of her thyroid function tests, partly due to changes in methimazole doses, but also partly due to changes in her immunosuppressant therapies. During the last 2 years her TSH values have ranged from a low of 0.1432 to a high of 14.933. The dose of methimazole has been adjusted as needed to treat her Graves' disease. It has been my hope that her Hashimoto's disease will eventually cancel out her Graves' disease.   3.The patient's last  [facility] visit was on 08/27/17. At that visit I continued her Lantus dose of 32 units.   A. In the interim she has been fairly healthy. Her mobility is essentially unchanged. She has gained 3 pounds. She is doing well mentally.    B. Verneal takes her 2.5 mg of lisinopril first thing in the mornings and again at bedtime. There may be some days when the BP is checked before the morning lisinopril dose has come fully on board.    C. Her energy level has been "kind of sluggish".    D. Her CIDP is about the same. She saw Dr. Jacob at the  [facility] in June 2018. No changes in her CDIP treatment plan were made. She remains on her regimen of IVIG and iv steroids. She continues to have IVIG every Monday and Thursday. She receives iv steroids on Monday. Her steroid dose has not been changed since her last visit. better. She uses a walker to help her walk from the parking lot to her job. She has headaches about once a week. Dr. Hickling continues to follow her for her CIDP management. Her female German shepherd service dog is working out well.     E. She   has resumed working at [employer] part-time.    F. Her BGs have been higher. "I haven't been doing my best."   G. She remains on methimazole, 10 mg each morning and 5 mg each evening. She is taking lisinopril, 2.5 mg, twice daily. She is also still taking propranolol, vitamin D, iron, and a vitamin B complex pill. She remains on her gluten-free diet.  H. She began a new MDI regimen in mid-December 2017. She now takes 32 units of Lantus each evening. She takes Novolog according to our 150/50/5 plan.   I. "My anxiety has been crazy at times." Her insomnia has improved.   J. She moved out of her home and now lives with her best female friend.   K. Her left shoulder is fine.   4. Pertinent Review of Systems: Constitutional: The patient says she feels "off". She is more tired. Her appetite is off. She has been having more stomach pains and post-prandial  nausea and vomiting. She has 12-24 ounces of caffeinated drinks per day. She usually sleeps well when she does fall asleep. Her strength and stamina are less in the past 1-2 weeks.     Eyes: Her contact lenses are helping. Last eye exam was in August 2019. She did not have signs of DM.  Neck: The patient has not had any problems with anterior neck swelling and tenderness for more than a year.   Heart: She has a more rapid heart rate when she feels bad. The patient has no other complaints of palpitations, irregular heart beats, chest pain, or chest pressure.   Gastrointestinal:  As above. She avoids pork due to GI symptoms. Bowel movents seem normal. The patient has no complaints of postprandial bloating, excessive hunger, acid reflux, diarrhea, or constipation.  Legs: Muscle mass and strength seem to be about the same. There are few complaints of numbness, tingling, burning, or pain. No edema is noted.  Feet: There are no obvious new foot problems. There are no complaints of numbness, tingling, burning, or pain. No edema is noted. Neurologic: She still requires the use of her walker. Muscle weakness is about the same. Sensation seems to be normal. Coordination is about the same. GYN: Her LMP was this week. Menses have been regular.  She is on OCPs.  Hypoglycemia: She has had a few low BGs.    5. Blood glucose printout: We have data for two weeks. She is checking BGs 0-4 times per day. She missed all BG checks on one day. She checked a total of 38 times out of a possible 56 times. She appears to have missed at least two doses of Lantus. Her average BG is 262, compared with 262 at her last visit and with 268 at her prior visit. Her BG range was 73 - >400, compared with 65-556 at her last visit and with 59-high at her prior visit. When she takes insulin her BGs decrease as expected. She had one low BG of 73 and one of 79.   PAST MEDICAL, FAMILY, AND SOCIAL HISTORY: 1. School/work: She is working part-time  as [employer]. She has not been able to do much exercise.  2. Activities: She has been walking less.  3. Smoking, alcohol, or drugs: None 4. Primary Care Provider: Dr. Yvonne Lowne at [facility]. 5. Neurologist: Dr. William Hickling  REVIEW OF SYSTEMS: There are no other significant problems involving the patient's other body systems.  PHYSICAL EXAM: BP 110/66   Pulse (!) 136     Wt 147 lb 9.6 oz (67 kg)   BMI 25.34 kg/m   Constitutional: This patient is in her wheelchair today, but otherwise appears reasonably healthy and looks pretty good and typical for her age. She has gained 3 pounds since her last visit. Her affect and insight are normal today. She still has significant difficulty with walking with her walker.  Head: The head is normocephalic. Face: The face appears normal. There are no obvious dysmorphic features. Eyes: The eyes appear to be normally formed and spaced. Gaze is conjugate. There is no obvious arcus or proptosis. Moisture appears normal. Mouth: The oropharynx and tongue appear normal. Dentition appears to be normal for age. Oral moisture is normal. She has no tongue tremor.  Neck: On inspection the thyroid gland is again visibly enlarged. The gland is again diffusely enlarged, but smaller at about 22 grams in size. Both lobes are symmetrically enlarged today. The isthmus is not enlarged today. The thyroid gland is firm, but not tender to palpation. Lungs: The lungs are clear to auscultation. Air movement is good. Heart: Heart rate os very fast. Heart rhythm is regular. Heart sounds S1 and S2 are normal. I did not appreciate any pathologic cardiac murmurs. Abdomen: The abdomen is larger in size today. Bowel sounds are normal. There is no obvious hepatomegaly, splenomegaly, or other mass effect.  Arms: Muscle size and bulk are low-normal for age. Hands: There is a trace tremor today. Phalangeal and metacarpophalangeal joints are normal. Palmar muscles are low-normal  for age. Palmar skin shows no palmar erythema. Palmar moisture is normal.. Legs: Muscles appear low-normal for age. No edema is present. Feet: Dorsalis pedal pulses are faint 1+ on the right and faint 1+ on the left.     Neurologic: Strength is 4-5/5 for age in the upper extremities and 2/5 in the right leg and 4/5 in the left leg. Muscle tone is somewhat low. Sensation to touch is normal in both her legs and feet.    LAB DATA:  Labs 02/23/18: CBG 264  Labs 01/09/18: HbA1c 10.2%  Labs 08/27/17: CBG 220  Labs 06/25/17: HbA1c 9.6%, CBG 92; TSH 1.92, free T4 1.1, free T3 3.1, TSI <89; urine microalbumin/creatinine ratio 53 (ref <30); CBC normal; CMP normal, except for glucose 224, sodium 132, albumin 3.2 (ref 3.6-5.1), calcium 8.5 (ref 8.6-10.2, corrected to 9.2 for albumin level), globulin 6.1 (ref 1.9-3.7)  Labs 01/09/17: CBG 293  Labs 12/20/16: ACTH stimulation test: Cortisols 3, 18.9, 24.5; sodium 130, albumin 2.8  Labs 12/17/16: HbA1c 10.1%; TSH 1.14, free T4 1.2, free T3 2.6, TSI <89; WBC 15/8, Hgb 13.1, Hct 40/8%  Labs 07/11/16: HbA1c 11.5%, CBG 225  Labs 06/12/16: TSH 2.20, free T4 1.02, free T3 3.0; CBC normal; CMP normal except sodium 133, glucose 259, albumin 2.8, calcium 8.3  Labs 02/22/16: HbA1c 10.7%  Labs 10/18/15: HbA1c 10.8%  Labs 05/09/15: HbA1c 11.1%  Labs 04/07/15: CBC normal; TSH 1.826, free T4 1.06, free T3 2.8, TSI 156; iron 234, CMP normal except for sodium 132, glucose 159, albumin 2.9, calcium 8.7; ACTH 11, cortisol 10.5  Labs 11/08/14: HbA1c 10.9%; TSH 2.234, free T4 0.87, free T3 2.7, TSI pending  Labs 06/01/14: HbA1c is 9.7% today, compared with 11.5% at last visit. Other labs are pending.  Labs 02/17/14: TSH 2.125, free T4 0.91, free T3 3.0  Labs 07/09/13; HbA1c was 11.4%, compared with 9.1% at last visit and with 14.0% at the visit prior. TSH was 0.652, free T4 0.88, free T3 3.3    Labs 10/06/12: TSH 0.088, free T4 1.28, free T3 3.1  Labs 02/25/12: TSH 1.044,  free T4 1.45, free T3 3.4  Labs 06/14/11: TSH was 1.633. Free T4 1.47. Free T3 was 3.1. TSI was 56 (normal less than 120).                       ASSESSMENT: 1. Type 1 diabetes mellitus: Her BG control was worse in terms of A1c in august 2019. BG control in terms of average BG and BG range is about the same as at her last visit. She needs to be more consistent with BG checks and insulin doses. She really needs the Medtronic 670G pump and Guardian 3 sensor, but for those devices to be successful, she must commit to checking her BGs at least 4 times daily and entering her carb data into the pump.   2.  Hypoglycemia: She has one low BG of 73 and of 79.   3. Autonomic neuropathy with tachycardia: Her heart rate is much higher. As her BGs come under better control the inappropriate sinus tachycardia will improve.   4-8 Autoimmune thyroid disease, Graves' disease, Hashimoto's disease, and goiter:   A. Thyrotoxicosis: Her diffuse thyrotoxicosis, secondary to Graves' disease, has waxed and waned over time.  Her thyrotoxicosis was well controlled by her current methimazole doses in January 2018 and again in February 2019. She is more tired today, so she could be hypothyroid.  B. Hashimoto's thyroiditis: The patient's thyroiditis is clinically quiescent at this time. However, the pattern in which all 3 of her TFTs decreased in parallel together from 2013 to 2014 indicated that she had had a relatively recent flare up of Hashimoto's Dz. I expect that as her Hashimoto's disease progressively destroys more thyroid cells, we will be able to taper her methimazole to zero. She will probably become permanently hypothyroid over time and will require Synthroid hormone replacement.  C. Goiter: Thyroid gland is about the same size, but her lobes have shifted slightly in size again. The waxing and waning of thyroid lobe size is c/w evolving Hashimoto's disease.  9. Celiac disease: Patient is following gluten-free diet. She is  slowly gaining weight and is asymptomatic. 10.  Chronic inflammatory demyelinating polyneuropathy (CIDP) and muscle weakness: He right leg is weaker today. .      11-12. Fatigue and decreased stamina: These problems are better. Since she had been on a regimen of 1-2 iv glucocorticoid treatment per week for many months, it was possible that her adrenals were suppressed. However, her last ACTH and cortisol levels in November 2016 were normal. Her ACTH stimulation test results in July 2018 were also quite normal.  13. Emotionality: Her anxiety seems to fluctuate. She has had similar symptoms when she was initially thyrotoxic. If the anxiety worsens, I suggested a referral to Ms. Cathy Showfety, MSN.  She declined. 14-15. Dyspepsia-GERD: She is doing fairly well overall. 16. Hypertension: Her BP is good today on her twice daily low-dose lisinopril regimen. When she comes to our clinic her BPs are good. At home, however, when using a wrist cuff, her BPs are higher. Given Kelita's muscular weakness and tendency to be physically unbalanced, it is not appropriate to increase her BP medication too much, otherwise she will have more falls.  17. Nausea and vomiting: She could have gastroparesis and/or pancreatitis.  18. Left rib/LUQ pains and back pains: These pains have been less frequent. The pains appear to be chest and abdominal wall pains.   She might have intermittent pancreatitis.   18. Adjustment reaction to illness and medical therapy: I continue to be impressed and inspired by Stevana's dignity and courage as she tries to do all that she can to improve her own health.  PLAN: 1. Diagnostic: CBG today. TFTs, amylase, lipase, CMP 2. Therapeutic:   A. Continue the Lantus dose of 32 units as tolerated. Continue the current Novolog plan. Check BGs at meals and at bedtime every day.   B. Continue current doses of her endocrine meds.   C. Order the Medtronic 670G pump and Guardian 3 sensor when she is ready  to commit to doing all of the fingerstick BGs and pump site changes that will be required.  3. Patient education: We discussed all of the above at great length.  4. Follow-up: 2 months in a new patient slot.   Level of Service: This visit lasted in excess of 75 minutes. More than 50% of the visit was devoted to counseling  Michael Brennan, MD, CDE Adult and Pediatric Endocrinology        

## 2018-02-24 LAB — COMPREHENSIVE METABOLIC PANEL
AG Ratio: 0.4 (calc) — ABNORMAL LOW (ref 1.0–2.5)
ALKALINE PHOSPHATASE (APISO): 68 U/L (ref 33–115)
ALT: 15 U/L (ref 6–29)
AST: 24 U/L (ref 10–30)
Albumin: 3.2 g/dL — ABNORMAL LOW (ref 3.6–5.1)
BILIRUBIN TOTAL: 0.3 mg/dL (ref 0.2–1.2)
BUN: 10 mg/dL (ref 7–25)
CALCIUM: 8.9 mg/dL (ref 8.6–10.2)
CO2: 27 mmol/L (ref 20–32)
CREATININE: 0.54 mg/dL (ref 0.50–1.10)
Chloride: 98 mmol/L (ref 98–110)
Globulin: 8.2 g/dL (calc) — ABNORMAL HIGH (ref 1.9–3.7)
Glucose, Bld: 153 mg/dL — ABNORMAL HIGH (ref 65–99)
Potassium: 3.5 mmol/L (ref 3.5–5.3)
Sodium: 132 mmol/L — ABNORMAL LOW (ref 135–146)
TOTAL PROTEIN: 11.4 g/dL — AB (ref 6.1–8.1)

## 2018-02-24 LAB — T3, FREE: T3 FREE: 3.7 pg/mL (ref 2.3–4.2)

## 2018-02-24 LAB — LIPASE: Lipase: 21 U/L (ref 7–60)

## 2018-02-24 LAB — TSH: TSH: 0.35 mIU/L — ABNORMAL LOW

## 2018-02-24 LAB — AMYLASE: Amylase: 42 U/L (ref 21–101)

## 2018-02-24 LAB — T4, FREE: FREE T4: 1.1 ng/dL (ref 0.8–1.8)

## 2018-03-02 ENCOUNTER — Other Ambulatory Visit (INDEPENDENT_AMBULATORY_CARE_PROVIDER_SITE_OTHER): Payer: Self-pay | Admitting: *Deleted

## 2018-03-02 DIAGNOSIS — E0500 Thyrotoxicosis with diffuse goiter without thyrotoxic crisis or storm: Secondary | ICD-10-CM

## 2018-03-02 MED ORDER — METHIMAZOLE 10 MG PO TABS: 10.0000 mg | ORAL_TABLET | Freq: Two times a day (BID) | ORAL | 1 refills | Status: DC

## 2018-03-02 NOTE — Telephone Encounter (Signed)
-----   Message from Sherrlyn Hock, MD sent at 03/01/2018  9:30 PM EDT ----- Thyroid tests were hyperthyroid. CMP was normal except for glucose of 153, sodium of 132, total protein of 11.4, albumin of 3.2, and globulins of 8.2. Amylase and lipase were normal. I don't know the exact temporal relationship of her IgG dose to these lab results, but I believe that she may have had her IgG dose earlier that day. She needs to increase her methimazole to 10 mg, twice daily. Clinical staff: Please send in a new prescription for methimazole. Please repeat her TFTs, CBC, and CMP in 4 weeks. Thanks. Dr. Tobe Sos

## 2018-03-02 NOTE — Telephone Encounter (Signed)
Spoke with Jessica Kelley and let her know Per Dr. Tobe Sos "Thyroid tests were hyperthyroid. CMP was normal except for glucose of 153, sodium of 132, total protein of 11.4, albumin of 3.2, and globulins of 8.2. Amylase and lipase were normal. I don't know the exact temporal relationship of her IgG dose to these lab results, but I believe that she may have had her IgG dose earlier that day. She needs to increase her methimazole to 10 mg, twice daily." Patient states understanding and ended the call.      Clinical staff: Please send in a new prescription for methimazole. Please repeat her TFTs, CBC, and CMP in 4 weeks. Thanks.  Dr. Tobe Sos

## 2018-04-27 ENCOUNTER — Ambulatory Visit (INDEPENDENT_AMBULATORY_CARE_PROVIDER_SITE_OTHER): Payer: BLUE CROSS/BLUE SHIELD | Admitting: "Endocrinology

## 2018-04-27 ENCOUNTER — Encounter (INDEPENDENT_AMBULATORY_CARE_PROVIDER_SITE_OTHER): Payer: Self-pay | Admitting: "Endocrinology

## 2018-04-27 VITALS — BP 116/70 | HR 122 | Wt 143.8 lb

## 2018-04-27 DIAGNOSIS — E063 Autoimmune thyroiditis: Secondary | ICD-10-CM | POA: Diagnosis not present

## 2018-04-27 DIAGNOSIS — E0500 Thyrotoxicosis with diffuse goiter without thyrotoxic crisis or storm: Secondary | ICD-10-CM | POA: Diagnosis not present

## 2018-04-27 DIAGNOSIS — IMO0001 Reserved for inherently not codable concepts without codable children: Secondary | ICD-10-CM

## 2018-04-27 DIAGNOSIS — E1043 Type 1 diabetes mellitus with diabetic autonomic (poly)neuropathy: Secondary | ICD-10-CM

## 2018-04-27 DIAGNOSIS — E1065 Type 1 diabetes mellitus with hyperglycemia: Secondary | ICD-10-CM | POA: Diagnosis not present

## 2018-04-27 DIAGNOSIS — I10 Essential (primary) hypertension: Secondary | ICD-10-CM

## 2018-04-27 DIAGNOSIS — G6181 Chronic inflammatory demyelinating polyneuritis: Secondary | ICD-10-CM

## 2018-04-27 DIAGNOSIS — I4711 Inappropriate sinus tachycardia, so stated: Secondary | ICD-10-CM

## 2018-04-27 DIAGNOSIS — E11649 Type 2 diabetes mellitus with hypoglycemia without coma: Secondary | ICD-10-CM | POA: Diagnosis not present

## 2018-04-27 DIAGNOSIS — K3184 Gastroparesis: Secondary | ICD-10-CM

## 2018-04-27 DIAGNOSIS — R000 Tachycardia, unspecified: Secondary | ICD-10-CM

## 2018-04-27 DIAGNOSIS — K900 Celiac disease: Secondary | ICD-10-CM

## 2018-04-27 LAB — POCT GLUCOSE (DEVICE FOR HOME USE): POC Glucose: 270 mg/dl — AB (ref 70–99)

## 2018-04-27 LAB — POCT GLYCOSYLATED HEMOGLOBIN (HGB A1C): Hemoglobin A1C: 9.1 % — AB (ref 4.0–5.6)

## 2018-04-27 NOTE — Patient Instructions (Signed)
Follow up visit in 2 months.  

## 2018-04-27 NOTE — Progress Notes (Signed)
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  HISTORY OF PRESENT ILLNESS: The patient is a 27 year-old Caucasian young woman. The patient was accompanied by a female friend.  82. This 36 year-old Caucasian young woman with an already complicated medical history was first referred to me on 09/25/06 by her primary care provider, Dr. Garnet Koyanagi, for evaluation and management of type I diabetes mellitus, hypoglycemia, and a myriad of other autoimmune issues.   A. CIDP: At about age 73 the child began to limp.  At age 87 she was evaluated at [facility] and diagnosed with CIDP. At the time I first met her she was under the care of Dr. Marcene Brawn of [facility] in [place]. She was on a regular regimen of monthly intravenous treatments at home with IVIG and steroids on the days of therapy. She was also taking cyclosporine A daily, with alternating doses every other day. Because of the CIDP she had  significant problems with muscle atrophy and weakness, as well as multiple orthopedic issues. She was also being followed by Dr. Simonne Come, an orthopedist in [place]. On that first visit her legs and arms were quite weak. She found it very hard to walk or to exercise.  B. Type 1 diabetes mellitus: Her diabetes mellitus was also diagnosed at age 56, just prior to the diagnosis of CIDP. She had been started on a Medtronic Paradigm 712 insulin pump subsequently and was using Humalog lispro insulin in her pump. She had a very difficult time with blood glucose control. At the time of her steroids, her sugars  tended to run very high. However, when she was able to perform some physical activities, she tended to develop hypoglycemia. Her hemoglobin A1c on that first visit was 9.5%.  C. Celiac disease: The patient was diagnosed with celiac disease at approximately age 71. She had been on a gluten-free diet ever since.  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 She was followed both at [facility] and at the [facility] eye clinic.  2. Since that initial visit with me, I have followed her for the problems listed above plus other new problems that have developed over time.   A. CIDP: Her CIDP course has waxed and waned. At one point she had improved significantly, her medication doses had been reduced, she felt very much stronger, had more stamina, and was doing very well. Unfortunately, she later had a significant relapse of CIDP and lost much of the improvement that she had gained. Since going back to [facility] in the Spring of 2013, however, and starting a new and more intensified IVIG regimen, her strength had improved. She was still very weak physically and muscularly, however, and needed support to walk. As noted below, she gets stronger when her steroids are increased and weaker when the steroids are tapered. In the past two years Dr. Wyline Copas, MD, our senior pediatric neurologist, has assumed the role of managing her CIDP.  B. Type 1 diabetes mellitus: The patient's blood sugars have varied significantly with the course of her CIDP and the treatments for that disease. Her hemoglobin A1c values have  ranged from a low of 8.3% to a high of 14.0%. Most of her A1c values have been in the 8.4-10.1% range.   C. Celiac disease: The patient has really done quite well over the years. She rarely has had signs or symptoms of abdominal problems. Because her antibody levels  fluctuated a great deal, her celiac disease antibodies were negative in March 2012. She was mistakenly told that she did not have celiac disease and that she could resume a normal diet. As expected, resumption of a normal diet caused a recurrence of active celiac disease. In October 2012 she saw a gastroenterologist, Dr. Gale Journey, at [facility] who told her that she did have celiac disease. She is now back on her gluten-free diet almost all the time and is asymptomatic again.  D. Autoimmune thyroid disease: The patient had a goiter and a TPO antibody level of 287.2 on her first visit in May of 2008, consistent with evolving Hashimoto's Disease. She occasionally had the sensations of swelling and discomfort in her anterior neck, which were also c/w flare-ups of Hashimoto's Disease. In early 2009 she developed increased tachycardia. Lab tests performed in March 2010 by her primary care provider showed a TSH of 0.01 and a T4 of 7.7. It was initially felt that the increase in tachycardia was likely due to a combination of autonomic neuropathy and Hashitoxicosis.  However on follow-up laboratory tests performed in July, her TSH was 0.05, free T4 3.9, and free T3 14.2. Her TSI level done on the assay used at the time was 2.2, with normal being 1.0 or less. It was then evident that she had developed Graves' Disease in the setting of pre-existing Hashimoto's Disease.  I initiated treatment with methimazole, 10 mg twice daily and propranolol 20 mg 3 times daily. Since then we've seen significant swings of her thyroid function tests, partly due to changes in methimazole doses, but also partly due to changes in her immunosuppressant therapies. During the last 2 years her TSH values have ranged from a low of 0.1432 to a high of 14.933. The dose of methimazole has been adjusted as needed to treat her Graves' disease. It has been my hope that her Hashimoto's disease will eventually cancel out her Graves' disease.   3.The patient's last  [facility] visit was on 02/23/18. At that visit I continued her Lantus dose of 32 units and her current Novolog plan. .   A. In the interim she has felt a lot better since increasing her methimazole dosage in early October to 10 mg, twice daily. She has more energy, so she wants to walk more. Her mobility is better. She has lost 4 pounds. She feels a   lot better emotionally and mentally.    Cephus Richer takes her 2.5 mg of lisinopril first thing in the morning and again at bedtime.     C. Her energy level has improved a lot.     D. Her CIDP is about the same. She saw Dr. Edison Nasuti at the  [facility] in June 2018. No changes in her CDIP treatment plan were made. She remains on her regimen of IVIG and iv steroids. She continues to have IVIG every Monday and Thursday. She receives iv steroids on Monday. Her steroid dose has not been changed since her last visit. better. She uses a walker to help her walk from the parking lot to her job. She has headaches about once a week. She has recently converted begun seeing Dr. Gustavus Bryant at the  [facility] for her CIDP management. Her CIDP regimen is unchanged. She does still intend to see D Hickling annually. Her female [place] shepherd service dog is working out well.     E. She continues working at [employer] part-time. She may convert to full-time work soon.   F. Her BGs have been "a little better'.   G. She remains on methimazole, 10 mg twice daily. She is taking lisinopril, 2.5 mg, twice daily. She is also still taking propranolol, vitamin D, iron, and a vitamin B complex pill. She remains on her gluten-free diet.  H. She continues on her MDI regimen. She now takes 32 units of Lantus each evening. She takes Novolog according to our 150/50/5 plan.   I. "My anxiety is better." Her insomnia has improved.   J. She still lives with her best female friend.   4. Pertinent Review of Systems: Constitutional: The patient says she feels "hyper" today, but not  hyperthyroid. She has more energy. Her appetite is better in that she no longer gets nauseated after eating. She has not been having many stomach pains and much less post-prandial nausea and vomiting. She has 24 ounces of caffeinated drinks per day. She usually sleeps well when she does fall asleep. Her strength and stamina have been  better in the past 1-2 weeks.     Eyes: Her contact lenses are helping. Last eye exam was in August 2019. She did not have signs of DM.  Neck: The patient has not had any problems with anterior neck swelling and tenderness for more than a year.   Heart: She has a more rapid heart rate today after coming into the building from the parking lot. The patient has no other complaints of palpitations, irregular heart beats, chest pain, or chest pressure.   Gastrointestinal:  As above. She avoids pork due to GI symptoms. Bowel movents seem normal. She still has some postprandial bloating. The patient has no complaints of excessive hunger, acid reflux, diarrhea, or constipation.  Legs: Muscle mass and strength seem to be "a little bit better". There are few complaints of numbness, tingling, burning, or pain. No edema is noted.  Feet: There are no obvious new foot problems. There are no complaints of numbness, tingling, burning, or pain. No edema is noted. Neurologic: She still requires the use of her walker. Muscle weakness may be a bit better. Sensation seems to be normal. Coordination is about the same. GYN: Her LMP was 3 weeks ago. Menses have been regular.  She is on OCPs.  Hypoglycemia: She has had a couple of low BGs.    5. Blood glucose printout: We have data for the past 4 weeks. She checked BGS on 22 of the past 28 days. When she checks BGs, she checks 1-5 times per day. She appears to have missed at least one doses of Lantus and many doses of Novolog. Her average BG is 272, compared with 262 at her last visit and with 262 at her prior visit. Her BG range was 79-526,  compared with 73 - >400 at her last visit and with 65-556 at her prior visit. When she takes insulin her BGs decrease as expected. She had one low BG of 79.    PAST MEDICAL, FAMILY, AND SOCIAL HISTORY: 1. School/work: She is working part-time as Naval architect. She is planning to be married in the Spring.    2. Activities: She has been walking more  3. Smoking, alcohol, or drugs: None 4.  Primary Care Provider: Dr. Garnet Koyanagi at [facility]. 5. Neurologist: Dr. Gustavus Bryant, [facility]  REVIEW OF SYSTEMS: There are no other significant problems involving the patient's other body systems.  PHYSICAL EXAM: BP 116/70   Pulse (!) 122   Wt 143 lb 12.8 oz (65.2 kg)   BMI 24.68 kg/m   Constitutional: This patient is in her wheelchair today, but due in large part to needing to move more quickly than she can walk. She appears healthy and looks pretty good and typical for her age. She has lost 4 pounds since her last visit. Her affect and insight are quite good today.  Head: The head is normocephalic. Face: The face appears normal. There are no obvious dysmorphic features. Eyes: The eyes appear to be normally formed and spaced. Gaze is conjugate. There is no obvious arcus or proptosis. Moisture appears normal. Her last eye exam was in August. There were no signs of DM damage.  Mouth: The oropharynx and tongue appear normal. Dentition appears to be normal for age. Oral moisture is normal. She has no tongue tremor.  Neck: On inspection the thyroid gland is again visibly enlarged. The gland is again enlarged, but smaller,  at about 21-22 grams in size. The left lobe is very mildly enlarged. The inferior right lobe is larger. The isthmus is not enlarged today. The thyroid gland is relatively firm, but not tender to palpation. Lungs: The lungs are clear to auscultation. Air movement is good. Heart: Heart rate is very fast. Heart rhythm is regular. Heart sounds S1 and S2 are normal. I did not appreciate  any pathologic cardiac murmurs. Abdomen: The abdomen is larger in size today. Bowel sounds are normal. There is no obvious hepatomegaly, splenomegaly, or other mass effect.  Arms: Muscle size and bulk are low-normal for age. Hands: There is a trace tremor today. Phalangeal and metacarpophalangeal joints are normal. Palmar muscles are low-normal for age. Palmar skin shows no palmar erythema. Palmar moisture is normal.. Legs: Muscles appear low-normal for age. No edema is present. Feet: Dorsalis pedal pulses are 1+ bilaterally.  Neurologic: Strength is 4-5/5 for age in the upper extremities, 2/5 in the right leg, and 4/5 in the left leg. Muscle tone is somewhat low. Sensation to touch is normal in both her legs and feet.   Skin: Her skin is itchy.  Scalp: She is losing hair again, just as she has every time we change MTZ doses.  LAB DATA:  Labs 04/27/18: HbA1c 9.1%, CBG 270  Labs 02/23/18: CBG 264; TSH 0.35, free T4 1, free T3 3.7; CMP normal except glucose of 153, total protein 11.4 (ref 6.1-8.1), albumin 3.2 (ref 3.6-5.1), globulin 8.2 (ref 1.9-3.7); amylase 42 (ref 21-101)  Labs 01/09/18: HbA1c 10.2%  Labs 08/27/17: CBG 220  Labs 06/25/17: HbA1c 9.6%, CBG 92; TSH 1.92, free T4 1.1, free T3 3.1, TSI <89; urine microalbumin/creatinine ratio 53 (ref <30); CBC normal; CMP normal, except for glucose 224, sodium 132, albumin 3.2 (ref 3.6-5.1), calcium 8.5 (ref 8.6-10.2, corrected to 9.2 for albumin level), globulin 6.1 (ref 1.9-3.7)  Labs 01/09/17: CBG 293  Labs 12/20/16: ACTH stimulation test: Cortisols 3, 18.9, 24.5; sodium 130, albumin 2.8  Labs 12/17/16: HbA1c 10.1%; TSH 1.14, free T4 1.2, free T3 2.6, TSI <89; WBC 15/8, Hgb 13.1, Hct 40/8%  Labs 07/11/16: HbA1c 11.5%, CBG 225  Labs 06/12/16: TSH 2.20, free T4 1.02, free T3 3.0; CBC normal; CMP normal except sodium 133, glucose 259, albumin 2.8, calcium 8.3  Labs 02/22/16: HbA1c  10.7%  Labs 10/18/15: HbA1c 10.8%  Labs 05/09/15: HbA1c  11.1%  Labs 04/07/15: CBC normal; TSH 1.826, free T4 1.06, free T3 2.8, TSI 156; iron 234, CMP normal except for sodium 132, glucose 159, albumin 2.9, calcium 8.7; ACTH 11, cortisol 10.5  Labs 11/08/14: HbA1c 10.9%; TSH 2.234, free T4 0.87, free T3 2.7, TSI pending  Labs 06/01/14: HbA1c is 9.7% today, compared with 11.5% at last visit. Other labs are pending.  Labs 02/17/14: TSH 2.125, free T4 0.91, free T3 3.0  Labs 07/09/13; HbA1c was 11.4%, compared with 9.1% at last visit and with 14.0% at the visit prior. TSH was 0.652, free T4 0.88, free T3 3.3  Labs 10/06/12: TSH 0.088, free T4 1.28, free T3 3.1  Labs 02/25/12: TSH 1.044, free T4 1.45, free T3 3.4  Labs 06/14/11: TSH was 1.633. Free T4 1.47. Free T3 was 3.1. TSI was 56 (normal less than 120).                       ASSESSMENT: 1. Type 1 diabetes mellitus:   A. Her BG control has improved in the past 3 months.She has been more careful with her eating and has been trying to walk more. She has also done a much better job of checking her BGs and taking her insulins.  Her average BG, however,  is slightly worse.    B. The explanation for this discrepancy is that she is having more lower BGs than she is measuring.   C. She needs to be even more consistent with BG checks and insulin doses.   D. She really needs the Medtronic 670G pump and Guardian 3 sensor, but for those devices to be successful, she must commit to checking her BGs at least 2-4 times daily and entering her carb data into the pump.   2.  Hypoglycemia: She has one low BG of 79.   3. Autonomic neuropathy with tachycardia and gastroparesis: Her heart rate is lower, but still too high. As her BGs come under better control the inappropriate sinus tachycardia and gastroparesis will improve.    4-8 Autoimmune thyroid disease, Graves' disease, Hashimoto's disease, and goiter:   A. Thyrotoxicosis: Her diffuse thyrotoxicosis, secondary to Graves' disease, has waxed and waned over time.   Her thyrotoxicosis was well controlled by her current methimazole doses in January 2018 and again in February 2019. In October 2019, however, she was again hyperthyroid, so we increased her MTZ dose accordingly. She feels hyper today, so she could still be hyperthyroid.  B. Hashimoto's thyroiditis: The patient's thyroiditis is clinically quiescent at this time. However, the pattern in which all 3 of her TFTs decreased in parallel together from 2013 to 2014 indicated that she had had a relatively recent flare up of Hashimoto's Dz. I expect that as her Hashimoto's disease progressively destroys more thyroid cells, we will be able to taper her methimazole to zero. She will probably become permanently hypothyroid over time and will require Synthroid hormone replacement.  C. Goiter: Thyroid gland is a bit smaller and her lobes have shifted slightly in size yet again. The waxing and waning of thyroid lobe size is c/w evolving Hashimoto's disease.  9. Celiac disease: Patient is following gluten-free diet. She is slowly gaining weight and is asymptomatic. 10.  Chronic inflammatory demyelinating polyneuropathy (CIDP) and muscle weakness: Her left leg is stronger.    11-12. Fatigue and decreased stamina: These problems are better. Since she had been on a regimen of  1-2 iv glucocorticoid treatment per week for many months, it was possible that her adrenals were suppressed. However, her last ACTH and cortisol levels in November 2016 were normal. Her ACTH stimulation test results in July 2018 were also quite normal.  13. Emotionality: Her anxiety is much better today. She has had similar symptoms when she was initially thyrotoxic. If the anxiety worsens, I suggested a referral to Ms. Rea College, MSN.  She declined. 14-15. Dyspepsia-GERD: She is doing fairly well overall. 16. Hypertension: Her BP is good today on her twice daily low-dose lisinopril regimen. Given Mckenzy's muscular weakness and tendency to be  physically unbalanced, it is not appropriate to increase her BP medication too much, otherwise she will have more falls.  17. Nausea and vomiting: These symptoms have improved as her BGs have improved. She did not have pancreatitis.  18. Left rib/LUQ pains and back pains: Resolved.    19. Adjustment reaction to illness and medical therapy: I continue to be impressed and inspired by Cristianna's dignity and courage as she tries to do all that she can to improve her own health.  PLAN: 1. Diagnostic: HbA1c and CBG today. TFTs, CBC, CMP today. 2. Therapeutic:   A. Continue the Lantus dose of 32 units as tolerated. Continue the current Novolog plan. Check BGs at meals and at bedtime every day.   B. Continue current doses of her endocrine meds.   C. Order the Medtronic 670G pump and Guardian 3 sensor when she is ready to commit to doing all of the fingerstick BGs and pump site changes that will be required.  3. Patient education: We discussed all of the above at great length.  4. Follow-up: 2 months in a new patient slot.   Level of Service: This visit lasted in excess of 80 minutes. More than 50% of the visit was devoted to counseling  Tillman Sers, MD, CDE Adult and Pediatric Endocrinology

## 2018-04-28 LAB — CBC WITH DIFFERENTIAL/PLATELET
BASOS ABS: 39 {cells}/uL (ref 0–200)
Basophils Relative: 0.5 %
EOS PCT: 0 %
Eosinophils Absolute: 0 cells/uL — ABNORMAL LOW (ref 15–500)
HEMATOCRIT: 41.9 % (ref 35.0–45.0)
Hemoglobin: 13.5 g/dL (ref 11.7–15.5)
LYMPHS ABS: 257 {cells}/uL — AB (ref 850–3900)
MCH: 28.4 pg (ref 27.0–33.0)
MCHC: 32.2 g/dL (ref 32.0–36.0)
MCV: 88.2 fL (ref 80.0–100.0)
MONOS PCT: 0.3 %
MPV: 12.5 fL (ref 7.5–12.5)
NEUTROS PCT: 95.9 %
Neutro Abs: 7480 cells/uL (ref 1500–7800)
PLATELETS: 195 10*3/uL (ref 140–400)
RBC: 4.75 10*6/uL (ref 3.80–5.10)
RDW: 13.2 % (ref 11.0–15.0)
TOTAL LYMPHOCYTE: 3.3 %
WBC mixed population: 23 cells/uL — ABNORMAL LOW (ref 200–950)
WBC: 7.8 10*3/uL (ref 3.8–10.8)

## 2018-04-28 LAB — COMPREHENSIVE METABOLIC PANEL
AG Ratio: 0.4 (calc) — ABNORMAL LOW (ref 1.0–2.5)
ALBUMIN MSPROF: 3.3 g/dL — AB (ref 3.6–5.1)
ALKALINE PHOSPHATASE (APISO): 69 U/L (ref 33–115)
ALT: 14 U/L (ref 6–29)
AST: 24 U/L (ref 10–30)
BILIRUBIN TOTAL: 0.3 mg/dL (ref 0.2–1.2)
BUN: 18 mg/dL (ref 7–25)
CALCIUM: 9 mg/dL (ref 8.6–10.2)
CHLORIDE: 98 mmol/L (ref 98–110)
CO2: 25 mmol/L (ref 20–32)
Creat: 0.74 mg/dL (ref 0.50–1.10)
GLOBULIN: 8.2 g/dL — AB (ref 1.9–3.7)
Glucose, Bld: 264 mg/dL — ABNORMAL HIGH (ref 65–99)
POTASSIUM: 3.7 mmol/L (ref 3.5–5.3)
Sodium: 131 mmol/L — ABNORMAL LOW (ref 135–146)
Total Protein: 11.5 g/dL — ABNORMAL HIGH (ref 6.1–8.1)

## 2018-04-28 LAB — TSH: TSH: 0.11 mIU/L — ABNORMAL LOW

## 2018-04-28 LAB — T4, FREE: Free T4: 1.2 ng/dL (ref 0.8–1.8)

## 2018-04-28 LAB — T3, FREE: T3 FREE: 3.2 pg/mL (ref 2.3–4.2)

## 2018-05-06 ENCOUNTER — Other Ambulatory Visit (INDEPENDENT_AMBULATORY_CARE_PROVIDER_SITE_OTHER): Payer: Self-pay | Admitting: "Endocrinology

## 2018-05-06 ENCOUNTER — Telehealth (INDEPENDENT_AMBULATORY_CARE_PROVIDER_SITE_OTHER): Payer: Self-pay

## 2018-05-06 ENCOUNTER — Telehealth (INDEPENDENT_AMBULATORY_CARE_PROVIDER_SITE_OTHER): Payer: Self-pay | Admitting: *Deleted

## 2018-05-06 DIAGNOSIS — IMO0001 Reserved for inherently not codable concepts without codable children: Secondary | ICD-10-CM

## 2018-05-06 DIAGNOSIS — E0500 Thyrotoxicosis with diffuse goiter without thyrotoxic crisis or storm: Secondary | ICD-10-CM

## 2018-05-06 DIAGNOSIS — E1065 Type 1 diabetes mellitus with hyperglycemia: Principal | ICD-10-CM

## 2018-05-06 NOTE — Telephone Encounter (Signed)
Attempted to contact patient to give lab results, unable to leave a voicemail due to full inbox. Will attempt again at a later time.

## 2018-05-06 NOTE — Telephone Encounter (Signed)
Spoke to patient, advised that per Dr. Tobe Sos: CBC is normal, except lymphocyte count is low, possibly due to immunosuppressants. CMP was normal, except for glucose of 264 and globulin of 8.2. Thyroid tests were hyperthyroid. She needs to increase her methimazole to 15 mg, twice daily,  She voiced understanding of the change.

## 2018-05-06 NOTE — Telephone Encounter (Signed)
-----   Message from Sherrlyn Hock, MD sent at 05/05/2018  9:27 PM EST ----- CBC is normal, except lymphocyte count is low, possibly due to immunosuppressants. CMP was normal, except for glucose of 264 and globulin of 8.2. Thyroid tests were hyperthyroid. She needs to increase her methimazole to 15 mg, twice daily, Clinical staff: Please order a repeat CBC, TSH, free T4, and free T3 to be done in 4 weeks. Thanks. Dr. Tobe Sos

## 2018-05-14 ENCOUNTER — Telehealth (INDEPENDENT_AMBULATORY_CARE_PROVIDER_SITE_OTHER): Payer: Self-pay | Admitting: "Endocrinology

## 2018-05-14 ENCOUNTER — Telehealth (INDEPENDENT_AMBULATORY_CARE_PROVIDER_SITE_OTHER): Payer: Self-pay | Admitting: Pediatrics

## 2018-05-14 DIAGNOSIS — G6181 Chronic inflammatory demyelinating polyneuritis: Secondary | ICD-10-CM

## 2018-05-14 DIAGNOSIS — R269 Unspecified abnormalities of gait and mobility: Secondary | ICD-10-CM

## 2018-05-14 NOTE — Telephone Encounter (Signed)
Spoke with mom to inform her that Dr. Gaynell Face is out of the office until tomorrow morning. I informed her that I will send him the message and we will get back with her

## 2018-05-14 NOTE — Telephone Encounter (Signed)
°  Who's calling (name and relationship to patient) : Georgana Curio  Best contact number: [phone]   Provider they see: Dr. Gaynell Face  Reason for call: Mom called requesting a light weight wheelchair, and needs Dr. Gaynell Face to send a prescription to [place] [facility] in [place] fax number is [phone].    PRESCRIPTION REFILL ONLY  Name of prescription:  Pharmacy:

## 2018-05-15 NOTE — Telephone Encounter (Signed)
Order has been completed and copied as has the most recent office note.  These were signed and placed on your desk.

## 2018-05-15 NOTE — Telephone Encounter (Signed)
Order and notes have been faxed to [facility]

## 2018-05-15 NOTE — Telephone Encounter (Signed)
Spoke with mom to relay the information from Dr. Gaynell Face. She understood and stated that she will call us back when they open at 9 am

## 2018-05-15 NOTE — Telephone Encounter (Signed)
Spoke with mom about the light weight wheelchair. She stated that [facility] will accept the August note. She left the fax number so that it can be faxed. [phone] (fax)

## 2018-05-15 NOTE — Telephone Encounter (Signed)
I am fairly certain that we have to have a face-to-face before I can prescribe this.  Have mom check with the [facility].  I saw her in mid-August so that we are greater than 90 days and we did not discuss this.  If that is the case I need 1/2-hour visit with her.  Please call mom.

## 2018-05-28 ENCOUNTER — Telehealth (INDEPENDENT_AMBULATORY_CARE_PROVIDER_SITE_OTHER): Payer: Self-pay | Admitting: Pediatrics

## 2018-05-28 NOTE — Telephone Encounter (Signed)
Spoke with patient to find out what medication she is speaking of. She states that it is Gammagard. She stated that Dr. Gaynell Face would know what she is talking since it is not in her chart

## 2018-05-28 NOTE — Telephone Encounter (Signed)
°  Who's calling (name and relationship to patient) : [name] (Self)  Best contact number: [phone] Provider they see: Dr. Gaynell Face  Reason for call: Pt called to follow up on prior authorization for IV infusion liquid.

## 2018-05-29 NOTE — Telephone Encounter (Signed)
Gammagard is the medication.

## 2018-06-01 NOTE — Telephone Encounter (Signed)
Jessica Kelley called and asked to speak to me. She asked if we had received the prior auth request for her IV infusion. I reviewed the chart and told her that it looked like it was in process. She had no further questions. TG

## 2018-06-04 ENCOUNTER — Telehealth (INDEPENDENT_AMBULATORY_CARE_PROVIDER_SITE_OTHER): Payer: Self-pay | Admitting: Pediatrics

## 2018-06-04 NOTE — Telephone Encounter (Signed)
°  Who's calling (name and relationship to patient) : Georgana Curio, mom  Best contact number: [phone]  Provider they see: Dr. Gaynell Face  Reason for call: Mom called explaining that Jessica Kelley is out of medication and needs the medication ASAP. She wondering why the prior authorization is taking so long and what can be done to expedite it. Says this request was put in on 05/26/18, and I reviewed the chart and let her know the latest update which is on 06/01/18 that it is in process. Would like Lovinia to have her medication by tomorrow or Saturday, mom says it is urgent. If any updates or anything can be done please call mom.     PRESCRIPTION REFILL ONLY  Name of prescription:  Pharmacy:

## 2018-06-04 NOTE — Telephone Encounter (Signed)
Please call Mom and let her know that we have submitted the information for the PA and are still waiting for answer. I will continue to try to reach [facility] to see if anything else can be done. Thanks, Otila Kluver

## 2018-06-04 NOTE — Telephone Encounter (Signed)
L/M informing mom that we have submitted the prior authorization and are awaiting a decision. We are also reaching out to [facility] for the medication as well.

## 2018-06-04 NOTE — Telephone Encounter (Signed)
I have not heard anything else from [facility] or from [employer]. I redid the prior authorization. I am not sure what to do from here.

## 2018-06-05 NOTE — Telephone Encounter (Signed)
I called [facility]. They said that they were still working on getting the prior authorization for the Gammagard. TG

## 2018-06-09 ENCOUNTER — Telehealth (INDEPENDENT_AMBULATORY_CARE_PROVIDER_SITE_OTHER): Payer: Self-pay | Admitting: Pediatrics

## 2018-06-09 NOTE — Telephone Encounter (Signed)
Spoke with Lead from [place] ab[facility] the phone message. She states that she needed more clinical information for the patient to receive her medication. They needed to know how the patient was doing on the medication, etc. I have faxed over the information they requested.

## 2018-06-09 NOTE — Telephone Encounter (Signed)
Who's calling (name and relationship to patient) : Jessica Kelley Best contact number: [phone] Provider they see: Gaynell Face Reason for call: [employer] how member is doing on IVIG, how the pt is doing and what the POC is, site of care, where the member is receiving and who is providing IVIG  Call ID:      [place]  Name of prescription:  Pharmacy:

## 2018-06-11 NOTE — Telephone Encounter (Signed)
I left a message with Vaughan Basta with [facility] asking if Amsi's Gammaguard had been approved. TG

## 2018-06-11 NOTE — Telephone Encounter (Signed)
error 

## 2018-06-11 NOTE — Telephone Encounter (Signed)
Received a fax message from [facility] stating Approval for 13 of the [place]. This approval is effective from 06/04/2018-06/04/2019. Reference number [id number]. Fax was labeled and sent to batch scan so it may be placed in her chart.

## 2018-06-30 ENCOUNTER — Telehealth (INDEPENDENT_AMBULATORY_CARE_PROVIDER_SITE_OTHER): Payer: Self-pay | Admitting: "Endocrinology

## 2018-06-30 NOTE — Telephone Encounter (Signed)
Who's calling (name and relationship to patient) : Lilygrace Rodick (self)  Best contact number:  [phone]  Provider they see: Dr. Tobe Sos  Reason for call: Caller states that she needs to know the physician on call. Her hear rate is 147 and she has an achy chest. No other symptoms were reported.   Call ID:  [id number] Charted by: Dr. [facility]     PRESCRIPTION REFILL ONLY  Name of prescription:  Pharmacy:

## 2018-07-02 ENCOUNTER — Other Ambulatory Visit (INDEPENDENT_AMBULATORY_CARE_PROVIDER_SITE_OTHER): Payer: Self-pay | Admitting: "Endocrinology

## 2018-07-02 DIAGNOSIS — E1065 Type 1 diabetes mellitus with hyperglycemia: Principal | ICD-10-CM

## 2018-07-02 DIAGNOSIS — IMO0001 Reserved for inherently not codable concepts without codable children: Secondary | ICD-10-CM

## 2018-08-05 ENCOUNTER — Ambulatory Visit (INDEPENDENT_AMBULATORY_CARE_PROVIDER_SITE_OTHER): Payer: BLUE CROSS/BLUE SHIELD | Admitting: "Endocrinology

## 2018-08-19 ENCOUNTER — Ambulatory Visit (INDEPENDENT_AMBULATORY_CARE_PROVIDER_SITE_OTHER): Payer: BLUE CROSS/BLUE SHIELD | Admitting: Women's Health

## 2018-08-19 ENCOUNTER — Encounter: Payer: Self-pay | Admitting: Women's Health

## 2018-08-19 ENCOUNTER — Other Ambulatory Visit: Payer: Self-pay

## 2018-08-19 ENCOUNTER — Ambulatory Visit (INDEPENDENT_AMBULATORY_CARE_PROVIDER_SITE_OTHER): Payer: BLUE CROSS/BLUE SHIELD | Admitting: "Endocrinology

## 2018-08-19 ENCOUNTER — Encounter (INDEPENDENT_AMBULATORY_CARE_PROVIDER_SITE_OTHER): Payer: Self-pay | Admitting: "Endocrinology

## 2018-08-19 VITALS — BP 130/78 | Ht 64.0 in | Wt 133.0 lb

## 2018-08-19 VITALS — BP 118/76 | HR 100 | Wt 140.6 lb

## 2018-08-19 DIAGNOSIS — R5383 Other fatigue: Secondary | ICD-10-CM

## 2018-08-19 DIAGNOSIS — I10 Essential (primary) hypertension: Secondary | ICD-10-CM

## 2018-08-19 DIAGNOSIS — R1013 Epigastric pain: Secondary | ICD-10-CM

## 2018-08-19 DIAGNOSIS — E10649 Type 1 diabetes mellitus with hypoglycemia without coma: Secondary | ICD-10-CM | POA: Diagnosis not present

## 2018-08-19 DIAGNOSIS — K900 Celiac disease: Secondary | ICD-10-CM

## 2018-08-19 DIAGNOSIS — E1065 Type 1 diabetes mellitus with hyperglycemia: Secondary | ICD-10-CM | POA: Diagnosis not present

## 2018-08-19 DIAGNOSIS — F4320 Adjustment disorder, unspecified: Secondary | ICD-10-CM

## 2018-08-19 DIAGNOSIS — E0500 Thyrotoxicosis with diffuse goiter without thyrotoxic crisis or storm: Secondary | ICD-10-CM

## 2018-08-19 DIAGNOSIS — E1043 Type 1 diabetes mellitus with diabetic autonomic (poly)neuropathy: Secondary | ICD-10-CM | POA: Diagnosis not present

## 2018-08-19 DIAGNOSIS — R000 Tachycardia, unspecified: Secondary | ICD-10-CM

## 2018-08-19 DIAGNOSIS — Z01419 Encounter for gynecological examination (general) (routine) without abnormal findings: Secondary | ICD-10-CM | POA: Diagnosis not present

## 2018-08-19 DIAGNOSIS — E063 Autoimmune thyroiditis: Secondary | ICD-10-CM

## 2018-08-19 DIAGNOSIS — G6181 Chronic inflammatory demyelinating polyneuritis: Secondary | ICD-10-CM

## 2018-08-19 DIAGNOSIS — R140 Abdominal distension (gaseous): Secondary | ICD-10-CM

## 2018-08-19 DIAGNOSIS — IMO0001 Reserved for inherently not codable concepts without codable children: Secondary | ICD-10-CM

## 2018-08-19 LAB — POCT GLYCOSYLATED HEMOGLOBIN (HGB A1C): Hemoglobin A1C: 10.3 % — AB (ref 4.0–5.6)

## 2018-08-19 LAB — POCT GLUCOSE (DEVICE FOR HOME USE): POC Glucose: 174 mg/dl — AB (ref 70–99)

## 2018-08-19 MED ORDER — FLUCONAZOLE 150 MG PO TABS: 150.0000 mg | ORAL_TABLET | Freq: Once | ORAL | 0 refills | Status: AC

## 2018-08-19 NOTE — Progress Notes (Signed)
Jessica Kelley [birth year] [id number]    History:    Presents for annual exam.  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  Recently has had several days of high blood sugars, problem with her insulin now better control.  Normal Pap history.  Regular monthly cycle first 2 days heavy lasting 5 days.  Was on birth control pills for several months stopped due to increased moodiness, states felt "crazy" on them.  Wedding planned for May 9.  Uses a walker mostly and occasionally wheelchair for mobility.  Has a service dog.  Past medical history, past surgical history, family history and social history were all reviewed and documented in the EPIC chart.  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  Sister also type 1 diabetes.  Parents healthy.  ROS:  A ROS was performed and pertinent positives and negatives are included.  Exam:  Vitals:   08/19/18 1152  Weight: 133 lb (60.3 kg)  Height: 5' 4"  (1.626 m)   Body mass index is 22.83 kg/m.   General appearance: Right leg very thin, mild scoliosis  Thyroid:  Symmetrical, normal in size, without palpable masses or nodularity. Respiratory  Auscultation:  Clear without wheezing or rhonchi Cardiovascular  Auscultation:  Regular rate, without rubs, murmurs or gallops  Edema/varicosities:  Not grossly evident Abdominal  Soft,nontender, without masses, guarding or rebound.  Liver/spleen:  No organomegaly noted  Hernia:  None appreciated  Skin  Inspection:  Grossly normal   Breasts: Examined lying and sitting.     Right: Without masses, retractions, discharge or axillary adenopathy.     Left: Without masses, retractions, discharge or axillary adenopathy. Gentitourinary   Inguinal/mons:  Normal without inguinal adenopathy  External genitalia:  Normal  BUS/Urethra/Skene's glands:   Normal  Vagina: Erythemic cervix:  Normal  Uterus:  normal in size, shape and contour.  Midline and mobile  Adnexa/parametria:     Rt: Without masses or tenderness.   Lt: Without masses or tenderness.  Anus and perineum: Normal    Assessment/Plan:  28 y.o. engaged WF G0 for annual exam with no GYN complaints, very pleasant with excellent attitude regarding health.  Monthly cycle/first 2 days heavy/condoms Yeast vaginitis Type 1 diabetes, hypothyroidism, celiac disease, Wyburn Mason syndrome endocrinologist and primary care manage Right leg weakness/walker and or wheelchair for mobility  Plan: Contraception reviewed, does not desire children, options reviewed, Mirena IUD.  Will check coverage, call office first day of bleeding for Dr. Dellis Filbert to place.  Reviewed slight risks of infection, perforation, spotting initially, lighter cycles and may become amenorrheic.  Reviewed extremely effective for contraception.  Diflucan 150 mg, 1 dose given prescription given.  SBEs, exercise, calcium rich foods, MVI daily encouraged.  Pap normal 2018, new screening guidelines reviewed.   [place], 11:56 AM 08/19/2018

## 2018-08-19 NOTE — Progress Notes (Signed)
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  HISTORY OF PRESENT ILLNESS: The patient is a 28 year-old Caucasian young woman. She was unaccompanied today.   39. This 28 year-old Caucasian young woman with an already complicated medical history was first referred to me on 09/25/06 by her primary care provider, Dr. Garnet Koyanagi, for evaluation and management of type I diabetes mellitus, hypoglycemia, and a myriad of other autoimmune issues.   A. CIDP: At about age 4 the child began to limp.  At age 54 she was evaluated at [facility] and diagnosed with CIDP. At the time I first met her she was under the care of Dr. Marcene Brawn of [facility] in [place]. She was on a regular regimen of monthly intravenous treatments at home with IVIG and steroids on the days of therapy. She was also taking cyclosporine A daily, with alternating doses every other day. Because of the CIDP she had  significant problems with muscle atrophy and weakness, as well as multiple orthopedic issues. She was also being followed by Dr. Simonne Come, an orthopedist in [place]. On that first visit her legs and arms were quite weak. She found it very hard to walk or to exercise.  B. Type 1 diabetes mellitus: Her diabetes mellitus was also diagnosed at age 59, just prior to the diagnosis of CIDP. She had been started on a Medtronic Paradigm 712 insulin pump subsequently and was using Humalog lispro insulin in her pump. She had a very difficult time with blood glucose control. At the time of her steroids, her sugars tended to run very  high. However, when she was able to perform some physical activities, she tended to develop hypoglycemia. Her hemoglobin A1c on that first visit was 9.5%.  C. Celiac disease: The patient was diagnosed with celiac disease at approximately age 30. She had been on a gluten-free diet ever since.  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 She was followed both at [facility] and at the [facility] eye clinic.  2. Since that initial visit with me, I have followed her for the problems listed above plus other new problems that have developed over time.   A. CIDP: Her CIDP course has waxed and waned. At one point she had improved significantly, her medication doses had been reduced, she felt very much stronger, had more stamina, and was doing very well. Unfortunately, she later had a significant relapse of CIDP and lost much of the improvement that she had gained. Since going back to [facility] in the Spring of 2013, however, and starting a new and more intensified IVIG regimen, her strength had improved. She was still very weak physically and muscularly, however, and needed support to walk. As noted below, she gets stronger when her steroids are increased and weaker when the steroids are tapered. In the past two years Dr. Wyline Copas, MD, our senior pediatric neurologist, had assumed the role of managing her CIDP. More recently, however, she has been seeing Dr. Gustavus Bryant at [facility].  B. Type 1 diabetes mellitus: The patient's blood sugars have varied significantly with the course of her CIDP and the  treatments for that disease. Her hemoglobin A1c values have ranged from a low of 8.3% to a high of 14.0%. Most of her A1c values have been in the 8.4-10.1% range.   C. Celiac disease: The patient has really done quite well over the years. She rarely has had signs or symptoms of  abdominal problems. Because her antibody levels fluctuated a great deal, her celiac disease antibodies were negative in March 2012. She was mistakenly told that she did not have celiac disease and that she could resume a normal diet. As expected, resumption of a normal diet caused a recurrence of active celiac disease. In October 2012 she saw a gastroenterologist, Dr. Gale Journey, at [facility] who told her that she did have celiac disease. She is now back on her gluten-free diet almost all the time and is asymptomatic again.  D. Autoimmune thyroid disease: The patient had a goiter and a TPO antibody level of 287.2 on her first visit in May of 2008, consistent with evolving Hashimoto's Disease. She occasionally had the sensations of swelling and discomfort in her anterior neck, which were also c/w flare-ups of Hashimoto's Disease. In early 2009 she developed increased tachycardia. Lab tests performed in March 2010 by her primary care provider showed a TSH of 0.01 and a T4 of 7.7. It was initially felt that the increase in tachycardia was likely due to a combination of autonomic neuropathy and Hashitoxicosis.  However on follow-up laboratory tests performed in July, her TSH was 0.05, free T4 3.9, and free T3 14.2. Her TSI level done on the assay used at the time was 2.2, with normal being 1.0 or less. It was then evident that she had developed Graves' Disease in the setting of pre-existing Hashimoto's Disease.  I initiated treatment with methimazole, 10 mg twice daily and propranolol 20 mg 3 times daily. Since then we've seen significant swings of her thyroid function tests, partly due to changes in methimazole doses, but also partly due to changes in her immunosuppressant therapies. During the last 2 years her TSH values have ranged from a low of 0.1432 to a high of 14.933. The dose of methimazole has been adjusted as needed to treat her Graves' disease. It has been my hope that her Hashimoto's disease will eventually cancel  out her Graves' disease.   3.The patient's last [facility] visit was on 04/27/18. At that visit I continued her Lantus dose of 32 units and her current Novolog plan. .   A. In the interim she was going to the gym 5 days per week, really felt good, "I had my life together. Now with the closures due to corona virus, she is unable to use the gym facilities and so is walking around her apartment complex. She feels "pretty evened out" in terms of her Graves disease and Hashimoto's disease. Her energy is good and she is more active. Her mobility is about the same. She has lost 3 more pounds. She feels good emotionally and mentally.    Cephus Richer takes her 2.5 mg of lisinopril first thing in the morning and again at bedtime.     C. Her CIDP is about the same. She saw Dr. Edison Nasuti at the  [facility] in June 2018. No changes in her CDIP treatment plan were made. She remains on her regimen of IVIG and iv steroids. She continues to have IVIG every Monday and Thursday. She receives iv steroids on Monday. Her steroid dose has not been changed since her last visit. better. She  uses a walker to help her walk from the parking lot to her job. Her headaches have not been frequent. She has recently converted to seeing Dr. Gustavus Bryant at the [facility] for her CIDP management. Her CIDP regimen is unchanged. She does still intend to see Dr Gaynell Face annually. Her female [place] shepherd service dog is working out well.     D. Her BGs have been "higher".   E. She remains on methimazole, 10 mg twice daily. She is taking lisinopril, 2.5 mg, twice daily. She is also still taking propranolol, vitamin D, iron, and a vitamin B complex pill. She remains on her gluten-free diet.  F. She continues on her MDI regimen. She still takes 32 units of Lantus each evening. She takes Novolog according to our 150/50/5 plan.   G. "I'm anxious about purchasing a new home" that she and her fiance will live in once they're married. Her insomnia has worsened  since being out of work.    J. She still lives with her best female friend, but will move in with her fiance soon.  4. Pertinent Review of Systems: Constitutional: Jimi says she feels "good" today, not hyper or hypo. She has more energy. Her appetite is better. She drinks about 12-24 ounces of caffeinated drinks per day. She usually sleeps well when she does fall asleep. Her strength and stamina have been  better in the past months.     Eyes: Her contact lenses are helping. Last eye exam was in August 2019. She did not have signs of DM.  Neck: The patient has not had any problems with anterior neck swelling and tenderness for more than a year.   Heart: Her heart rate is "always kind of up". She has no other complaints of palpitations, irregular heart beats, chest pain, or chest pressure.   Gastrointestinal:  As above. Most of her GI problems resolved after she stopped eating pork products. She still has some postprandial bloating. Bowel movents seem normal. The patient has no complaints of excessive hunger, acid reflux, diarrhea, or constipation.  Legs: Muscle mass and strength seem to be "the same". There are few complaints of numbness, tingling, burning, or pain. No edema is noted.  Feet: There are no obvious new foot problems. There are no complaints of numbness, tingling, burning, or pain. No edema is noted. Neurologic: She still requires the use of her walker. Muscle weakness is still about the same. Sensation seems to be normal. Coordination is about the same. GYN: Her LMP was this past week. Menses have been regular.  She is on OCPs, but will soon convert to an IUD due to her PMS. Scalp: She is not losing hair excessively anymore. Hypoglycemia: She has had some low BGs.    5. Blood glucose review: We were not able to download her BG meter today. I reviewed the BG meter data manually. BGs varied from 51-476, with more BGs >250 than <250. She thinks that some of her low BGs were due to  trying to cut back on carbs. Then she often overtreats the low BGs.    PAST MEDICAL, FAMILY, AND SOCIAL HISTORY: 1. 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 She is planning to be married in May.   2. Activities: She has been walking more  3. Smoking, alcohol, or drugs: None 4. Primary Care Provider: [facility]. Garnet [facility]agi at [facility]. 5. Neurologist: Dr. Gustavus Bryant, [facility] every 6 months  and Dr. Gaynell Face for annual visits.  REVIEW OF SYSTEMS: There are no other significant problems involving the patient's other body systems.  PHYSICAL EXAM: BP 118/76   Pulse 100   Wt 140 lb 9.6 oz (63.8 kg)   LMP 08/11/2018   BMI 24.13 kg/m   Constitutional: This patient is in her wheelchair today. She appears healthy and looks pretty good, but also a bit tired. She did not sleep well last night. She has lost 4 pounds intentionally since her last visit in an effort to fit into the wedding gown that she picked out. Her affect and insight are quite good today.  Head: The head is normocephalic. Face: The face appears normal. There are no obvious dysmorphic features. Eyes: The eyes appear to be normally formed and spaced. Gaze is conjugate. There is no obvious arcus or proptosis. Moisture appears normal. Mouth: The oropharynx and tongue appear normal. Dentition appears to be normal for age. Oral moisture is normal. She has no tongue tremor.  Neck: On inspection the thyroid gland is again visibly enlarged. The gland is again enlarged  at about 21-22 grams in size. The left lobe is more enlarged and the right lobe is smaller today, the reverse of her exam at her last visit. The thyroid gland is relatively firm, but not tender to palpation. Lungs: The lungs are clear to auscultation. Air movement is good. Heart: Heart rate is very fast. Heart rhythm is regular. Heart sounds S1 and S2 are normal. I did not appreciate any pathologic  cardiac murmurs. Abdomen: The abdomen is smaller in size today. Bowel sounds are normal. There is no obvious hepatomegaly, splenomegaly, or other mass effect.  Arms: Muscle size and bulk are low-normal for age. Hands: There is no tremor today. Phalangeal and metacarpophalangeal joints are normal. Palmar muscles are low-normal for age. Palmar skin shows no palmar erythema. Palmar moisture is normal.. Legs: Muscles appear low-normal for age. No edema is present. Feet: Dorsalis pedal pulses are 1+ on the right and 2+ on the left.  Neurologic: Strength is 4-5/5 for age in the upper extremities, 2/5 in the right leg, and 4/5 in the left leg. Muscle tone is somewhat low. Sensation to touch is normal in both her legs and feet.   Skin: No rashes Scalp: She thinks her scalp is doing OK. She has hair extensions in place. Overall the scalp looks pretty good.   LAB DATA:  Labs 08/19/18: HbA1c 10.3%, CBG 174  Labs 04/27/18: HbA1c 9.1%, CBG 270  Labs 02/23/18: CBG 264; TSH 0.35, free T4 1, free T3 3.7; CMP normal except glucose of 153, total protein 11.4 (ref 6.1-8.1), albumin 3.2 (ref 3.6-5.1), globulin 8.2 (ref 1.9-3.7); amylase 42 (ref 21-101)  Labs 01/09/18: HbA1c 10.2%  Labs 08/27/17: CBG 220  Labs 06/25/17: HbA1c 9.6%, CBG 92; TSH 1.92, free T4 1.1, free T3 3.1, TSI <89; urine microalbumin/creatinine ratio 53 (ref <30); CBC normal; CMP normal, except for glucose 224, sodium 132, albumin 3.2 (ref 3.6-5.1), calcium 8.5 (ref 8.6-10.2, corrected to 9.2 for albumin level), globulin 6.1 (ref 1.9-3.7)  Labs 01/09/17: CBG 293  Labs 12/20/16: ACTH stimulation test: Cortisols 3, 18.9, 24.5; sodium 130, albumin 2.8  Labs 12/17/16: HbA1c 10.1%; TSH 1.14, free T4 1.2, free T3 2.6, TSI <89; WBC 15/8, Hgb 13.1, Hct 40/8%  Labs 07/11/16: HbA1c 11.5%, CBG 225  Labs 06/12/16: TSH 2.20, free T4 1.02, free T3 3.0; CBC normal; CMP normal except sodium 133, glucose 259, albumin 2.8, calcium 8.3  Labs  02/22/16: HbA1c  10.7%  Labs 10/18/15: HbA1c 10.8%  Labs 05/09/15: HbA1c 11.1%  Labs 04/07/15: CBC normal; TSH 1.826, free T4 1.06, free T3 2.8, TSI 156; iron 234, CMP normal except for sodium 132, glucose 159, albumin 2.9, calcium 8.7; ACTH 11, cortisol 10.5  Labs 11/08/14: HbA1c 10.9%; TSH 2.234, free T4 0.87, free T3 2.7, TSI pending  Labs 06/01/14: HbA1c is 9.7% today, compared with 11.5% at last visit. Other labs are pending.  Labs 02/17/14: TSH 2.125, free T4 0.91, free T3 3.0  Labs 07/09/13; HbA1c was 11.4%, compared with 9.1% at last visit and with 14.0% at the visit prior. TSH was 0.652, free T4 0.88, free T3 3.3  Labs 10/06/12: TSH 0.088, free T4 1.28, free T3 3.1  Labs 02/25/12: TSH 1.044, free T4 1.45, free T3 3.4  Labs 06/14/11: TSH was 1.633. Free T4 1.47. Free T3 was 3.1. TSI was 56 (normal less than 120).                       ASSESSMENT: 1. Type 1 diabetes mellitus:   A. Her BG control has deteriorated in the past 3 months, in part because she is out of work and hasn't adjusted to her new schedule and in part due to pre-wedding and house buying anxieties. She has been trying to cut back on carbs in order to fit into her wedding dress, so sometimes drops low and then overtreats with excess carbs.    B. She needs to be even more consistent with BG checks and insulin doses.   C. She really needs the Medtronic 670G pump and Guardian 3 sensor, but for those devices to be successful, she must commit to checking her BGs at least 2-4 times daily and entering her carb data into the pump. She says that she is about ready to commit to an insulin pump, so we talked about the three most common options.  2.  Hypoglycemia: She has several low BGs in the 50s.    3. Autonomic neuropathy with tachycardia and gastroparesis: Her heart rate is higher, c/w her higher BGs.     4-8 Autoimmune thyroid disease, Graves' disease, Hashimoto's disease, and goiter:   A. Thyrotoxicosis: Her diffuse thyrotoxicosis, secondary  to Graves' disease, has waxed and waned over time.  Her thyrotoxicosis was well controlled by her current methimazole doses in January 2018 and again in February 2019. In October 2019, however, she was again hyperthyroid, so we increased her MTZ dose accordingly. She feels normal today. She needs to have TFTs done.   B. Hashimoto's thyroiditis: The patient's thyroiditis is clinically quiescent at this time. However, the pattern in which all 3 of her TFTs decreased in parallel together from 2013 to 2014 indicated that she had had a relatively recent flare up of Hashimoto's Dz. I expect that as her Hashimoto's disease progressively destroys more thyroid cells, we will be able to taper her methimazole to zero. She will probably become permanently hypothyroid over time and will require Synthroid hormone replacement.  C. Goiter: Thyroid gland is about the same size and her lobes have shifted in size yet again. The waxing and waning of thyroid lobe size is c/w evolving Hashimoto's disease. We need to check her TFTs. 9. Celiac disease: Patient is following her gluten-free diet and is doing well.  10.  Chronic inflammatory demyelinating polyneuropathy (CIDP) and muscle weakness: Her left leg is stronger, but her right leg remains very weak.    11-12. Fatigue  and decreased stamina: These problems are better. Since she had been on a regimen of 1-2 iv glucocorticoid treatment per week for many months, it was possible that her adrenals were suppressed. However, her last ACTH and cortisol levels in November 2016 were normal. Her ACTH stimulation test results in July 2018 were also quite normal.  13. Emotionality: Her anxiety is much better today. She has had similar symptoms when she was initially thyrotoxic. If the anxiety worsens, I suggested a referral to Ms. Rea College, MSN.  She declined. 14-15. Dyspepsia-GERD: She is doing fairly well overall. 16. Hypertension: Her BP is good today on her twice daily low-dose  lisinopril regimen. Given Lamaya's muscular weakness and tendency to be physically unbalanced, it is not appropriate to increase her BP medication too much, otherwise she will have more falls.  17.  Adjustment reaction to illness and medical therapy: I continue to be impressed and inspired by Joselin's dignity and courage as she tries to do all that she can to improve her own health.  PLAN: 1. Diagnostic: HbA1c and CBG today. Annual surveillance labs soon 2. Therapeutic:   A. Continue the Lantus dose of 32 units as tolerated. Continue the current Novolog plan. Check BGs at meals and at bedtime every day.   B. Continue current doses of her endocrine meds.   C. Order the Medtronic 670G pump and Guardian 3 sensor, or other pumps and sensors, when she is ready to commit to doing all of the fingerstick BGs and pump site changes that will be required.  3. Patient education: We discussed all of the above at great length.  4. Follow-up: 2 months in a new patient slot.   Level of Service: This visit lasted in excess of 70 minutes. More than 50% of the visit was devoted to counseling  Tillman Sers, MD, CDE Adult and Pediatric Endocrinology

## 2018-08-19 NOTE — Patient Instructions (Addendum)
Health Maintenance, Female Adopting a healthy lifestyle and getting preventive care can go a long way to promote health and wellness. Talk with your health care provider about what schedule of regular examinations is right for you. This is a good chance for you to check in with your provider about disease prevention and staying healthy. In between checkups, there are plenty of things you can do on your own. Experts have done a lot of research about which lifestyle changes and preventive measures are most likely to keep you healthy. Ask your health care provider for more information. Weight and diet Eat a healthy diet  Be sure to include plenty of vegetables, fruits, low-fat dairy products, and lean protein.  Do not eat a lot of foods high in solid fats, added sugars, or salt.  Get regular exercise. This is one of the most important things you can do for your health. ? Most adults should exercise for at least 150 minutes each week. The exercise should increase your heart rate and make you sweat (moderate-intensity exercise). ? Most adults should also do strengthening exercises at least twice a week. This is in addition to the moderate-intensity exercise. Maintain a healthy weight  Body mass index (BMI) is a measurement that can be used to identify possible weight problems. It estimates body fat based on height and weight. Your health care provider can help determine your BMI and help you achieve or maintain a healthy weight.  For females 62 years of age and older: ? A BMI below 18.5 is considered underweight. ? A BMI of 18.5 to 24.9 is normal. ? A BMI of 25 to 29.9 is considered overweight. ? A BMI of 30 and above is considered obese. Watch levels of cholesterol and blood lipids  You should start having your blood tested for lipids and cholesterol at 26 years of age, then have this test every 5 years.  You may need to have your cholesterol levels checked more often if: ? Your lipid or  cholesterol levels are high. ? You are older than 26 years of age. ? You are at high risk for heart disease. Cancer screening Lung Cancer  Lung cancer screening is recommended for adults 40-79 years old who are at high risk for lung cancer because of a history of smoking.  A yearly low-dose CT scan of the lungs is recommended for people who: ? Currently smoke. ? Have quit within the past 15 years. ? Have at least a 30-pack-year history of smoking. A pack year is smoking an average of one pack of cigarettes a day for 1 year.  Yearly screening should continue until it has been 15 years since you quit.  Yearly screening should stop if you develop a health problem that would prevent you from having lung cancer treatment. Breast Cancer  Practice breast self-awareness. This means understanding how your breasts normally appear and feel.  It also means doing regular breast self-exams. Let your health care provider know about any changes, no matter how small.  If you are in your 20s or 30s, you should have a clinical breast exam (CBE) by a health care provider every 1-3 years as part of a regular health exam.  If you are 43 or older, have a CBE every year. Also consider having a breast X-ray (mammogram) every year.  If you have a family history of breast cancer, talk to your health care provider about genetic screening.  If you are at high risk for breast cancer, talk  to your health care provider about having an MRI and a mammogram every year.  Breast cancer gene (BRCA) assessment is recommended for women who have family members with BRCA-related cancers. BRCA-related cancers include: ? Breast. ? Ovarian. ? Tubal. ? Peritoneal cancers.  Results of the assessment will determine the need for genetic counseling and BRCA1 and BRCA2 testing. Cervical Cancer Your health care provider may recommend that you be screened regularly for cancer of the pelvic organs (ovaries, uterus, and vagina).  This screening involves a pelvic examination, including checking for microscopic changes to the surface of your cervix (Pap test). You may be encouraged to have this screening done every 3 years, beginning at age 21.  For women ages 30-65, health care providers may recommend pelvic exams and Pap testing every 3 years, or they may recommend the Pap and pelvic exam, combined with testing for human papilloma virus (HPV), every 5 years. Some types of HPV increase your risk of cervical cancer. Testing for HPV may also be done on women of any age with unclear Pap test results.  Other health care providers may not recommend any screening for nonpregnant women who are considered low risk for pelvic cancer and who do not have symptoms. Ask your health care provider if a screening pelvic exam is right for you.  If you have had past treatment for cervical cancer or a condition that could lead to cancer, you need Pap tests and screening for cancer for at least 20 years after your treatment. If Pap tests have been discontinued, your risk factors (such as having a new sexual partner) need to be reassessed to determine if screening should resume. Some women have medical problems that increase the chance of getting cervical cancer. In these cases, your health care provider may recommend more frequent screening and Pap tests. Colorectal Cancer  This type of cancer can be detected and often prevented.  Routine colorectal cancer screening usually begins at 26 years of age and continues through 26 years of age.  Your health care provider may recommend screening at an earlier age if you have risk factors for colon cancer.  Your health care provider may also recommend using home test kits to check for hidden blood in the stool.  A small camera at the end of a tube can be used to examine your colon directly (sigmoidoscopy or colonoscopy). This is done to check for the earliest forms of colorectal cancer.  Routine  screening usually begins at age 50.  Direct examination of the colon should be repeated every 5-10 years through 26 years of age. However, you may need to be screened more often if early forms of precancerous polyps or small growths are found. Skin Cancer  Check your skin from head to toe regularly.  Tell your health care provider about any new moles or changes in moles, especially if there is a change in a mole's shape or color.  Also tell your health care provider if you have a mole that is larger than the size of a pencil eraser.  Always use sunscreen. Apply sunscreen liberally and repeatedly throughout the day.  Protect yourself by wearing long sleeves, pants, a wide-brimmed hat, and sunglasses whenever you are outside. Heart disease, diabetes, and high blood pressure  High blood pressure causes heart disease and increases the risk of stroke. High blood pressure is more likely to develop in: ? People who have blood pressure in the high end of the normal range (130-139/85-89 mm Hg). ? People   who are overweight or obese. ? People who are African American.  If you are 84-22 years of age, have your blood pressure checked every 3-5 years. If you are 67 years of age or older, have your blood pressure checked every year. You should have your blood pressure measured twice-once when you are at a hospital or clinic, and once when you are not at a hospital or clinic. Record the average of the two measurements. To check your blood pressure when you are not at a hospital or clinic, you can use: ? An automated blood pressure machine at a pharmacy. ? A home blood pressure monitor.  If you are between 52 years and 3 years old, ask your health care provider if you should take aspirin to prevent strokes.  Have regular diabetes screenings. This involves taking a blood sample to check your fasting blood sugar level. ? If you are at a normal weight and have a low risk for diabetes, have this test once  every three years after 26 years of age. ? If you are overweight and have a high risk for diabetes, consider being tested at a younger age or more often. Preventing infection Hepatitis B  If you have a higher risk for hepatitis B, you should be screened for this virus. You are considered at high risk for hepatitis B if: ? You were born in a country where hepatitis B is common. Ask your health care provider which countries are considered high risk. ? Your parents were born in a high-risk country, and you have not been immunized against hepatitis B (hepatitis B vaccine). ? You have HIV or AIDS. ? You use needles to inject street drugs. ? You live with someone who has hepatitis B. ? You have had sex with someone who has hepatitis B. ? You get hemodialysis treatment. ? You take certain medicines for conditions, including cancer, organ transplantation, and autoimmune conditions. Hepatitis C  Blood testing is recommended for: ? Everyone born from [birth year] through 1963. ? Anyone with known risk factors for hepatitis C. Sexually transmitted infections (STIs)  You should be screened for sexually transmitted infections (STIs) including gonorrhea and chlamydia if: ? You are sexually active and are younger than 26 years of age. ? You are older than 26 years of age and your health care provider tells you that you are at risk for this type of infection. ? Your sexual activity has changed since you were last screened and you are at an increased risk for chlamydia or gonorrhea. Ask your health care provider if you are at risk.  If you do not have HIV, but are at risk, it may be recommended that you take a prescription medicine daily to prevent HIV infection. This is called pre-exposure prophylaxis (PrEP). You are considered at risk if: ? You are sexually active and do not regularly use condoms or know the HIV status of your partner(s). ? You take drugs by injection. ? You are sexually active with a partner  who has HIV. Talk with your health care provider about whether you are at high risk of being infected with HIV. If you choose to begin PrEP, you should first be tested for HIV. You should then be tested every 3 months for as long as you are taking PrEP. Pregnancy  If you are premenopausal and you may become pregnant, ask your health care provider about preconception counseling.  If you may become pregnant, take 400 to 800 micrograms (mcg) of folic acid every  day.  If you want to prevent pregnancy, talk to your health care provider about birth control (contraception). Osteoporosis and menopause  Osteoporosis is a disease in which the bones lose minerals and strength with aging. This can result in serious bone fractures. Your risk for osteoporosis can be identified using a bone density scan.  If you are 76 years of age or older, or if you are at risk for osteoporosis and fractures, ask your health care provider if you should be screened.  Ask your health care provider whether you should take a calcium or vitamin D supplement to lower your risk for osteoporosis.  Menopause may have certain physical symptoms and risks.  Hormone replacement therapy may reduce some of these symptoms and risks. Talk to your health care provider about whether hormone replacement therapy is right for you. Follow these instructions at home:  Schedule regular health, dental, and eye exams.  Stay current with your immunizations.  Do not use any tobacco products including cigarettes, chewing tobacco, or electronic cigarettes.  If you are pregnant, do not drink alcohol.  If you are breastfeeding, limit how much and how often you drink alcohol.  Limit alcohol intake to no more than 1 drink per day for nonpregnant women. One drink equals 12 ounces of beer, 5 ounces of wine, or 1 ounces of hard liquor.  Do not use street drugs.  Do not share needles.  Ask your health care provider for help if you need support  or information about quitting drugs.  Tell your health care provider if you often feel depressed.  Tell your health care provider if you have ever been abused or do not feel safe at home. This information is not intended to replace advice given to you by your health care provider. Make sure you discuss any questions you have with your health care provider. Document Released: 11/19/2010 Document Revised: 10/12/2015 Document Reviewed: 02/07/2015 Elsevier Interactive Patient Education  2019 [employer]. Levonorgestrel intrauterine device (IUD) What is this medicine? LEVONORGESTREL IUD (LEE voe nor jes trel) is a contraceptive (birth control) device. The device is placed inside the uterus by a healthcare professional. It is used to prevent pregnancy. This device can also be used to treat heavy bleeding that occurs during your period. This medicine may be used for other purposes; ask your health care provider or pharmacist if you have questions. COMMON BRAND NAME(S): Minette Headland What should I tell my health care provider before I take this medicine? They need to know if you have any of these conditions: -abnormal Pap smear -cancer of the breast, uterus, or cervix -diabetes -endometritis -genital or pelvic infection now or in the past -have more than one sexual partner or your partner has more than one partner -heart disease -history of an ectopic or tubal pregnancy -immune system problems -IUD in place -liver disease or tumor -problems with blood clots or take blood-thinners -seizures -use intravenous drugs -uterus of unusual shape -vaginal bleeding that has not been explained -an unusual or allergic reaction to levonorgestrel, other hormones, silicone, or polyethylene, medicines, foods, dyes, or preservatives -pregnant or trying to get pregnant -breast-feeding How should I use this medicine? This device is placed inside the uterus by a health care  professional. Talk to your pediatrician regarding the use of this medicine in children. Special care may be needed. Overdosage: If you think you have taken too much of this medicine contact a poison control center or emergency room at once. NOTE: This medicine  is only for you. Do not share this medicine with others. What if I miss a dose? This does not apply. Depending on the brand of device you have inserted, the device will need to be replaced every 3 to 5 years if you wish to continue using this type of birth control. What may interact with this medicine? Do not take this medicine with any of the following medications: -amprenavir -bosentan -fosamprenavir This medicine may also interact with the following medications: -aprepitant -armodafinil -barbiturate medicines for inducing sleep or treating seizures -bexarotene -boceprevir -griseofulvin -medicines to treat seizures like carbamazepine, ethotoin, felbamate, oxcarbazepine, phenytoin, topiramate -modafinil -pioglitazone -rifabutin -rifampin -rifapentine -some medicines to treat HIV infection like atazanavir, efavirenz, indinavir, lopinavir, nelfinavir, tipranavir, ritonavir -St. John's wort -warfarin This list may not describe all possible interactions. Give your health care provider a list of all the medicines, herbs, non-prescription drugs, or dietary supplements you use. Also tell them if you smoke, drink alcohol, or use illegal drugs. Some items may interact with your medicine. What should I watch for while using this medicine? Visit your doctor or health care professional for regular check ups. See your doctor if you or your partner has sexual contact with others, becomes HIV positive, or gets a sexual transmitted disease. This product does not protect you against HIV infection (AIDS) or other sexually transmitted diseases. You can check the placement of the IUD yourself by reaching up to the top of your vagina with clean  fingers to feel the threads. Do not pull on the threads. It is a good habit to check placement after each menstrual period. Call your doctor right away if you feel more of the IUD than just the threads or if you cannot feel the threads at all. The IUD may come out by itself. You may become pregnant if the device comes out. If you notice that the IUD has come out use a backup birth control method like condoms and call your health care provider. Using tampons will not change the position of the IUD and are okay to use during your period. This IUD can be safely scanned with magnetic resonance imaging (MRI) only under specific conditions. Before you have an MRI, tell your healthcare provider that you have an IUD in place, and which type of IUD you have in place. What side effects may I notice from receiving this medicine? Side effects that you should report to your doctor or health care professional as soon as possible: -allergic reactions like skin rash, itching or hives, swelling of the face, lips, or tongue -fever, flu-like symptoms -genital sores -high blood pressure -no menstrual period for 6 weeks during use -pain, swelling, warmth in the leg -pelvic pain or tenderness -severe or sudden headache -signs of pregnancy -stomach cramping -sudden shortness of breath -trouble with balance, talking, or walking -unusual vaginal bleeding, discharge -yellowing of the eyes or skin Side effects that usually do not require medical attention (report to your doctor or health care professional if they continue or are bothersome): -acne -breast pain -change in sex drive or performance -changes in weight -cramping, dizziness, or faintness while the device is being inserted -headache -irregular menstrual bleeding within first 3 to 6 months of use -nausea This list may not describe all possible side effects. Call your doctor for medical advice about side effects. You may report side effects to FDA at  1-800-FDA-1088. Where should I keep my medicine? This does not apply. NOTE: This sheet is a summary. It may not  cover all possible information. If you have questions about this medicine, talk to your doctor, pharmacist, or health care provider.  2019 Elsevier/Gold Standard (2016-02-16 14:14:56)

## 2018-08-19 NOTE — Patient Instructions (Signed)
Follow up visit in 2 months.  

## 2018-08-31 ENCOUNTER — Other Ambulatory Visit (INDEPENDENT_AMBULATORY_CARE_PROVIDER_SITE_OTHER): Payer: Self-pay | Admitting: "Endocrinology

## 2018-08-31 DIAGNOSIS — E1065 Type 1 diabetes mellitus with hyperglycemia: Principal | ICD-10-CM

## 2018-08-31 DIAGNOSIS — IMO0001 Reserved for inherently not codable concepts without codable children: Secondary | ICD-10-CM

## 2018-10-19 ENCOUNTER — Other Ambulatory Visit: Payer: Self-pay

## 2018-10-19 ENCOUNTER — Ambulatory Visit (INDEPENDENT_AMBULATORY_CARE_PROVIDER_SITE_OTHER): Payer: BLUE CROSS/BLUE SHIELD | Admitting: Obstetrics & Gynecology

## 2018-10-19 ENCOUNTER — Encounter: Payer: Self-pay | Admitting: Obstetrics & Gynecology

## 2018-10-19 VITALS — BP 126/78

## 2018-10-19 DIAGNOSIS — Z30430 Encounter for insertion of intrauterine contraceptive device: Secondary | ICD-10-CM

## 2018-10-19 NOTE — Progress Notes (Signed)
    Jessica Kelley 03-05-1991 [id number]        28 y.o.  G0P0000  Married  RP: Mirena IUD insertion  HPI: Mirena IUD desired for contraception and for dysmenorrhea.  Normal menstrual period currently.  No pelvic pain.  Normal vaginal secretions.   OB History  Gravida Para Term Preterm AB Living  0 0 0 0 0 0  SAB TAB Ectopic Multiple Live Births  0 0 0 0 0    Past medical history,surgical history, problem list, medications, allergies, family history and social history were all reviewed and documented in the EPIC chart.   Directed ROS with pertinent positives and negatives documented in the history of present illness/assessment and plan.  Exam:  Vitals:   10/19/18 1609  BP: 126/78   General appearance:  Normal                                                                    IUD procedure note       Patient presented to the office today for placement of Mirena IUD. The patient had previously been provided with literature information on this method of contraception. The risks benefits and pros and cons were discussed and all her questions were answered. She is fully aware that this form of contraception is 99% effective and is good for 5 years.  Pelvic exam: Vulva normal Vagina: No lesions or discharge Cervix: No lesions or discharge Uterus: RV position Adnexa: No masses or tenderness Rectal exam: Not done  The cervix was cleansed with Betadine solution. Hurricane spray on the cervix.  A single-tooth tenaculum was placed on the anterior cervical lip.  Os finder used for mild dilation, finding the curve of the endocervical canal and hysterometry at 7 cm. The IUD was shown to the patient and inserted in a sterile fashion. The IUD string was trimmed. The single-tooth tenaculum was removed. Patient was instructed to return back to the office in one month for follow up.        Assessment/Plan:  28 y.o. G0P0000   1. Encounter for IUD insertion Desires Mirena IUD for  contraception and control of dysmenorrhea.  Information given on Mirena IUD and insertion procedure.  Patient's questions answered.  Easy insertion of Mirena IUD, no complication, well-tolerated by patient.  Post insertion of IUD precautions discussed.  Follow-up in 4 weeks for IUD check.  Princess Bruins MD, 4:21 PM 10/19/2018

## 2018-10-19 NOTE — Patient Instructions (Signed)
1. Encounter for IUD insertion Desires Mirena IUD for contraception and control of dysmenorrhea.  Information given on Mirena IUD and insertion procedure.  Patient's questions answered.  Easy insertion of Mirena IUD, no complication, well-tolerated by patient.  Post insertion of IUD precautions discussed.  Follow-up in 4 weeks for IUD check.  Jessica Kelley, it was a pleasure seeing you today!

## 2018-10-20 ENCOUNTER — Ambulatory Visit (INDEPENDENT_AMBULATORY_CARE_PROVIDER_SITE_OTHER): Payer: BLUE CROSS/BLUE SHIELD | Admitting: "Endocrinology

## 2018-10-30 ENCOUNTER — Other Ambulatory Visit (INDEPENDENT_AMBULATORY_CARE_PROVIDER_SITE_OTHER): Payer: Self-pay | Admitting: "Endocrinology

## 2018-10-30 DIAGNOSIS — IMO0001 Reserved for inherently not codable concepts without codable children: Secondary | ICD-10-CM

## 2018-11-09 ENCOUNTER — Other Ambulatory Visit: Payer: Self-pay

## 2018-11-09 ENCOUNTER — Encounter (INDEPENDENT_AMBULATORY_CARE_PROVIDER_SITE_OTHER): Payer: Self-pay | Admitting: "Endocrinology

## 2018-11-09 ENCOUNTER — Ambulatory Visit (INDEPENDENT_AMBULATORY_CARE_PROVIDER_SITE_OTHER): Payer: BC Managed Care – PPO | Admitting: "Endocrinology

## 2018-11-09 VITALS — BP 126/84 | HR 100 | Wt 137.0 lb

## 2018-11-09 DIAGNOSIS — E063 Autoimmune thyroiditis: Secondary | ICD-10-CM

## 2018-11-09 DIAGNOSIS — E1043 Type 1 diabetes mellitus with diabetic autonomic (poly)neuropathy: Secondary | ICD-10-CM

## 2018-11-09 DIAGNOSIS — R1013 Epigastric pain: Secondary | ICD-10-CM

## 2018-11-09 DIAGNOSIS — E1065 Type 1 diabetes mellitus with hyperglycemia: Secondary | ICD-10-CM

## 2018-11-09 DIAGNOSIS — R000 Tachycardia, unspecified: Secondary | ICD-10-CM | POA: Diagnosis not present

## 2018-11-09 DIAGNOSIS — R809 Proteinuria, unspecified: Secondary | ICD-10-CM

## 2018-11-09 DIAGNOSIS — I10 Essential (primary) hypertension: Secondary | ICD-10-CM

## 2018-11-09 DIAGNOSIS — F4320 Adjustment disorder, unspecified: Secondary | ICD-10-CM

## 2018-11-09 DIAGNOSIS — IMO0001 Reserved for inherently not codable concepts without codable children: Secondary | ICD-10-CM

## 2018-11-09 DIAGNOSIS — E10649 Type 1 diabetes mellitus with hypoglycemia without coma: Secondary | ICD-10-CM

## 2018-11-09 DIAGNOSIS — E0500 Thyrotoxicosis with diffuse goiter without thyrotoxic crisis or storm: Secondary | ICD-10-CM

## 2018-11-09 DIAGNOSIS — E1029 Type 1 diabetes mellitus with other diabetic kidney complication: Secondary | ICD-10-CM

## 2018-11-09 LAB — COMPREHENSIVE METABOLIC PANEL
AG Ratio: 0.4 (calc) — ABNORMAL LOW (ref 1.0–2.5)
ALT: 17 U/L (ref 6–29)
AST: 25 U/L (ref 10–30)
Albumin: 3.2 g/dL — ABNORMAL LOW (ref 3.6–5.1)
Alkaline phosphatase (APISO): 62 U/L (ref 31–125)
BUN: 17 mg/dL (ref 7–25)
CO2: 23 mmol/L (ref 20–32)
Calcium: 9 mg/dL (ref 8.6–10.2)
Chloride: 98 mmol/L (ref 98–110)
Creat: 0.55 mg/dL (ref 0.50–1.10)
Globulin: 7.2 g/dL (calc) — ABNORMAL HIGH (ref 1.9–3.7)
Glucose, Bld: 341 mg/dL — ABNORMAL HIGH (ref 65–139)
Potassium: 3.7 mmol/L (ref 3.5–5.3)
Sodium: 131 mmol/L — ABNORMAL LOW (ref 135–146)
Total Bilirubin: 0.3 mg/dL (ref 0.2–1.2)
Total Protein: 10.4 g/dL — ABNORMAL HIGH (ref 6.1–8.1)

## 2018-11-09 LAB — MICROALBUMIN / CREATININE URINE RATIO
Creatinine, Urine: 30 mg/dL (ref 20–275)
Microalb Creat Ratio: 40 mcg/mg creat — ABNORMAL HIGH (ref <30)
Microalb, Ur: 1.2 mg/dL

## 2018-11-09 LAB — POCT GLUCOSE (DEVICE FOR HOME USE): POC Glucose: 192 mg/dl — AB (ref 70–99)

## 2018-11-09 LAB — LIPID PANEL
Cholesterol: 157 mg/dL (ref <200)
HDL: 52 mg/dL (ref >=50)
LDL Cholesterol (Calc): 91 mg/dL (calc)
Non-HDL Cholesterol (Calc): 105 mg/dL (calc) (ref <130)
Total CHOL/HDL Ratio: 3 (calc) (ref <5.0)
Triglycerides: 62 mg/dL (ref <150)

## 2018-11-09 LAB — THYROID STIMULATING IMMUNOGLOBULIN: TSI: <89 % baseline (ref <140)

## 2018-11-09 LAB — T3, FREE: T3, Free: 3.9 pg/mL (ref 2.3–4.2)

## 2018-11-09 LAB — T4, FREE: Free T4: 1.6 ng/dL (ref 0.8–1.8)

## 2018-11-09 LAB — TSH: TSH: 0.03 mIU/L — ABNORMAL LOW

## 2018-11-09 MED ORDER — METHIMAZOLE 5 MG PO TABS: ORAL_TABLET | ORAL | 6 refills | Status: DC

## 2018-11-09 NOTE — Progress Notes (Signed)
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  HISTORY OF PRESENT ILLNESS: The patient is a 28 year-old Caucasian young woman. She was unaccompanied today.   63. This 28 year-old Caucasian young woman with an already complicated medical history was first referred to me on 09/25/06 by her primary care provider, Dr. Garnet Koyanagi, for evaluation and management of type I diabetes mellitus, hypoglycemia, and a myriad of other autoimmune issues.   A. CIDP: At about age 76 the child began to limp.  At age 43 she was evaluated at [facility] and diagnosed with CIDP. At the time I first met her she was under the care of Dr. Marcene Brawn of [facility] in [place]. She was on a regular regimen of monthly intravenous treatments at home with IVIG and steroids on the days of therapy. She was also taking cyclosporine A daily, with alternating doses every other day. Because of the CIDP she had  significant problems with muscle atrophy and weakness, as well as multiple orthopedic issues. She was also being followed by Dr. Simonne Come, an orthopedist in [place]. On that first visit her legs and arms were quite weak. She found it very hard to walk or to exercise.  B. Type 1 diabetes mellitus: Her diabetes mellitus was also diagnosed at age 54, just prior to the diagnosis of CIDP. She had been started on a Medtronic Paradigm 712 insulin pump subsequently and was using Humalog lispro insulin in her pump. She had a very difficult time with blood glucose control. At the time of her steroids, her sugars tended to run very  high. However, when she was able to perform some physical activities, she tended to develop hypoglycemia. Her hemoglobin A1c on that first visit was 9.5%.  C. Celiac disease: The patient was diagnosed with celiac disease at approximately age 67. She had been on a gluten-free diet ever since.  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 She was followed both at [facility] and at the [facility] eye clinic.  2. Since that initial visit with me, I have followed her for the problems listed above plus other new problems that have developed over time.   A. CIDP: Her CIDP course has waxed and waned. At one point she had improved significantly, her medication doses had been reduced, she felt very much stronger, had more stamina, and was doing very well. Unfortunately, she later had a significant relapse of CIDP and lost much of the improvement that she had gained. Since going back to [facility] in the Spring of 2013, however, and starting a new and more intensified IVIG regimen, her strength had improved. She was still very weak physically and muscularly, however, and needed support to walk. As noted below, she gets stronger when her steroids are increased and weaker when the steroids are tapered. In the past  years Dr. Wyline Copas, MD, our senior pediatric neurologist, had assumed the role of managing her CIDP. More recently, however, she has been seeing Dr. Gustavus Bryant at [facility].  B. Type 1 diabetes mellitus: The patient's blood sugars have varied significantly with the course of her CIDP and the treatments  for that disease. Her hemoglobin A1c values have ranged from a low of 8.3% to a high of 14.0%. Most of her A1c values have been in the 8.4-10.1% range.   C. Celiac disease: The patient has really done quite well over the years. She rarely has had signs or symptoms of  abdominal problems. Because her antibody levels fluctuated a great deal, her celiac disease antibodies were negative in March 2012. She was mistakenly told that she did not have celiac disease and that she could resume a normal diet. As expected, resumption of a normal diet caused a recurrence of active celiac disease. In October 2012 she saw a gastroenterologist, Dr. Gale Journey, at [facility] who told her that she did have celiac disease. She is now back on her gluten-free diet almost all the time and is asymptomatic again.  D. Autoimmune thyroid disease: The patient had a goiter and a TPO antibody level of 287.2 on her first visit in May of 2008, consistent with evolving Hashimoto's Disease. She occasionally had the sensations of swelling and discomfort in her anterior neck, which were also c/w flare-ups of Hashimoto's Disease. In early 2009 she developed increased tachycardia. Lab tests performed in March 2010 by her primary care provider showed a TSH of 0.01 and a T4 of 7.7. It was initially felt that the increase in tachycardia was likely due to a combination of autonomic neuropathy and Hashitoxicosis.  However on follow-up laboratory tests performed in July, her TSH was 0.05, free T4 3.9, and free T3 14.2. Her TSI level done on the assay used at the time was 2.2, with normal being 1.0 or less. It was then evident that she had developed Graves' Disease in the setting of pre-existing Hashimoto's Disease.  I initiated treatment with methimazole, 10 mg twice daily and propranolol 20 mg 3 times daily. Since then we've seen significant swings of her thyroid function tests, partly due to changes in methimazole doses, but also partly due to changes in her immunosuppressant therapies. Her TSH values have ranged from a low of 0.01 to a high of 14.933. The dose of methimazole has been adjusted as needed to treat her Graves' disease. It has been my hope that her Hashimoto's disease will eventually cancel out her Graves' disease.    3.The patient's last [facility] visit was on 08/19/18. At that visit I continued her Lantus dose of 32 units and her current Novolog plan. .   A. In the interim she has been healthy. Since her gym is closed due to the covid-19 virus, she has been walking more. She has had more hyperthyroid symptoms recently, to include not sleeping well, feeling hyper, having difficulty concentrating, being more emotional, and being more nervous and jittery.Her energy is pretty good and she is more active. Her mobility is about the same. She has lost 3 more pounds. She feels good emotionally and mentally.    Cephus Richer takes her 2.5 mg of lisinopril first thing in the morning and again at bedtime.     C. Her CIDP is about the same. She saw Dr. Edison Nasuti at the  [facility] in June 2018. No changes in her CDIP treatment plan were made. She remains on her regimen of IVIG and iv steroids. She continues to have IVIG every Monday and Thursday. She receives iv steroids on Monday. Her steroid dose has not been changed since her last visit. better. She uses a walker to help her walk from the parking lot to her job. Her headaches have  not been as frequent. She has recently converted to seeing Dr. Gustavus Bryant at the [facility] for her CIDP management. Her CIDP regimen is unchanged. She does still intend to see Dr Gaynell Face annually. Her female [place] shepherd service dog is working out well.     D. Her BGs have been "a little bit higher".   E. She remains on methimazole, but she misunderstood the previous instructions to increase her methimazole to 15 mg, twice daily, so has only been taking 15 mg daily, 10 mg in the morning and 5 mg in the evening. She is taking lisinopril, 2.5 mg, twice daily. She is also still taking propranolol, vitamin D, iron, and a vitamin B complex pill. She remains on her gluten-free diet.  F. She continues on her MDI regimen. She still takes 32 units of Lantus each evening. She takes Novolog according to our  150/50/5 plan.   G. Her anxiety has improved since the wedding. Her insomnia and early awakening have worsened since being more hyperthyroid.   J. She lives with her husband now. He works the night shift 5 days per week. He is off every other weekend and every other Wednesday and Thursday.   4. Pertinent Review of Systems: Constitutional: Tiffony says she feels "pretty good but somewhat hyper" today. She has a good energy level. Her appetite is unchanged. She drinks about 12-24 ounces of caffeinated drinks per day. Her strength and stamina have been pretty good in the past months.     Eyes: Her contact lenses are helping. Last eye exam was in August 2019. She did not have signs of DM. She is scheduled for a follow up exam in July.  Neck: The patient has not had any problems with anterior neck swelling and tenderness for more than a year.   Heart: Her heart rate is "often faster recently". She has no other complaints of palpitations, irregular heart beats, chest pain, or chest pressure.   Gastrointestinal:  Most of her GI problems resolved after she stopped eating pork products. She still has some postprandial bloating occasionally. Bowel movents seem normal. The patient has no complaints of excessive hunger, acid reflux, diarrhea, or constipation.  Legs: Muscle mass and strength seem to be "the same". There are few complaints of numbness, tingling, burning, or pain. No edema is noted.  Feet: There are no obvious new foot problems. There are no complaints of numbness, tingling, burning, or pain. No edema is noted. Neurologic: She still requires the use of her walker. Muscle weakness is still about the same. Sensation seems to be normal. Coordination is about the same. GYN: Her LMP was three weeks ago. She has a new IUD now.  Scalp: She is still occasionally  losing hair. Hypoglycemia: She has had a few low BGs.    5. Blood glucose review: She checks BGs 0-5 times daily, average 2.1 times per day.  Average BG was 274, range 50-598. She sometimes goes for up to 48 hours between BG checks. Her highest BGs occurred when she had not checked BGs for a long time. Her lowest BGs occurred when she was very active at work and did not take the time to eat.    PAST MEDICAL, FAMILY, AND SOCIAL HISTORY: 1. School/work: She is working full-time as Naval architect now. She was married in May. She will be changing her last name to Glen soon.  2. Activities: She has been walking more.  3. Smoking, alcohol, or drugs: None 4. Primary Care Provider:  Dr. Garnet Koyanagi at [place]. 5. Neurologist: Dr. Gustavus Bryant, [facility] every 6 months and Dr. Gaynell Face for annual visits.  REVIEW OF SYSTEMS: There are no other significant problems involving the patient's other body systems.  PHYSICAL EXAM: BP 126/84   Pulse 100   Wt 137 lb (62.1 kg)   BMI 23.52 kg/m   Constitutional: This patient is in her wheelchair today. She appears healthy and looks pretty good, but also a bit tired. She did not sleep well last night. She has lost another 3 pounds intentionally. Her affect and insight are quite good today.  Head: The head is normocephalic. Face: The face appears normal. There are no obvious dysmorphic features. Eyes: The eyes appear to be normally formed and spaced. Gaze is conjugate. There is no obvious arcus or proptosis. Moisture appears normal. Mouth: The oropharynx and tongue appear normal. Dentition appears to be normal for age. Oral moisture is normal. She has no tongue tremor.  Neck: On inspection the thyroid gland is again visibly enlarged. The gland is slightly more enlarged  at about 22 grams in size. Today the lobes are symmetrically enlarged and the isthmus is also mildly enlarged. The thyroid gland is softer today and is not tender to palpation. Lungs: The lungs are clear to auscultation. Air movement is good. Heart: Heart rate is very fast. Heart rhythm is regular. Heart sounds S1 and S2 are  normal. She has a grade 2/6 SEM flow murmur due to her Graves' disease.   Abdomen: The abdomen is smaller in size today. Bowel sounds are normal. There is no obvious hepatomegaly, splenomegaly, or other mass effect.  Arms: Muscle size and bulk are low-normal for age. Hands: There is no tremor today. Phalangeal and metacarpophalangeal joints are normal. Palmar muscles are low-normal for age. Palmar skin shows no palmar erythema. Palmar moisture is normal.. Legs: Muscles appear small for age, especially in the right leg. No edema is present. Feet: Dorsalis pedal pulses are 1-2+ on the right and 2+ on the left.  Neurologic: Strength is 4-5/5 for age in the upper extremities, 2/5 in the right leg, and 4/5 in the left leg. Muscle tone is somewhat low. Sensation to touch is normal in both her legs and feet.   Skin: No rashes Scalp: Overall the scalp looks pretty good.   LAB DATA:  Labs 11/05/18: CBG: 192; TSH 0.03, free T4 1.6, free T3 3.9, TSI <89; CMP normal except glucose of 341, sodium 131, albumin 3.2 (ref 3.6-5.1), globulin 7.2 (ref 1.9-3.7); cholesterol 157, triglycerides 62, HDL 52, LDL 91; urinary microalbumin/creatinine ratio 40  Labs 08/19/18: HbA1c 10.3%, CBG 174  Labs 04/27/18: HbA1c 9.1%, CBG 270; TSH 0.11, free T4 1.2, free T3 3.2; CMP normal, except glucose 264, sodium 131, albumin 3.3 (ref 3.6-5.1), globulin 8.2 (ref 1.9-3.7); CBC normal, except lymphocytes 257 (ref [phone])  Labs 02/23/18: CBG 264; TSH 0.35, free T4 1.1, free T3 3.7; CMP normal except glucose of 153, total protein 11.4 (ref 6.1-8.1), albumin 3.2 (ref 3.6-5.1), globulin 8.2 (ref 1.9-3.7); amylase 42 (ref 21-101)  Labs 01/09/18: HbA1c 10.2%  Labs 08/27/17: CBG 220  Labs 06/25/17: HbA1c 9.6%, CBG 92; TSH 1.92, free T4 1.1, free T3 3.1, TSI <89; urine microalbumin/creatinine ratio 53 (ref <30); CBC normal; CMP normal, except for glucose 224, sodium 132, albumin 3.2 (ref 3.6-5.1), calcium 8.5 (ref 8.6-10.2, corrected to  9.2 for albumin level), globulin 6.1 (ref 1.9-3.7)  Labs 01/09/17: CBG 293  Labs 12/20/16: ACTH stimulation test: Cortisols 3, 18.9, 24.5;  sodium 130, albumin 2.8  Labs 12/17/16: HbA1c 10.1%; TSH 1.14, free T4 1.2, free T3 2.6, TSI <89; WBC 15/8, Hgb 13.1, Hct 40/8%  Labs 07/11/16: HbA1c 11.5%, CBG 225  Labs 06/12/16: TSH 2.20, free T4 1.02, free T3 3.0; CBC normal; CMP normal except sodium 133, glucose 259, albumin 2.8, calcium 8.3  Labs 02/22/16: HbA1c 10.7%  Labs 10/18/15: HbA1c 10.8%  Labs 05/09/15: HbA1c 11.1%  Labs 04/07/15: CBC normal; TSH 1.826, free T4 1.06, free T3 2.8, TSI 156; iron 234, CMP normal except for sodium 132, glucose 159, albumin 2.9, calcium 8.7; ACTH 11, cortisol 10.5  Labs 11/08/14: HbA1c 10.9%; TSH 2.234, free T4 0.87, free T3 2.7, TSI pending  Labs 06/01/14: HbA1c is 9.7% today, compared with 11.5% at last visit. Other labs are pending.  Labs 02/17/14: TSH 2.125, free T4 0.91, free T3 3.0  Labs 07/09/13; HbA1c was 11.4%, compared with 9.1% at last visit and with 14.0% at the visit prior. TSH was 0.652, free T4 0.88, free T3 3.3  Labs 10/06/12: TSH 0.088, free T4 1.28, free T3 3.1  Labs 02/25/12: TSH 1.044, free T4 1.45, free T3 3.4  Labs 06/14/11: TSH was 1.633. Free T4 1.47. Free T3 was 3.1. TSI was 56 (normal less than 120).                       ASSESSMENT: 1. Type 1 diabetes mellitus:   A. Her BG control has deteriorated in the past 6 months, in part because she was out of work and hasn't adjusted to her new schedule, in part due to pre-wedding and house buying anxieties, and now in part to working full-time but also trying to eat with her husband on the two night each week that they can have dinner together. She is still not as consistent about checking BGs and giving injections as she needs to be.   B. She really needs the Medtronic 670G pump and Guardian 3 sensor, but for those devices to be successful, she must commit to checking her BGs at least 2-4  times daily and entering her carb data into the pump. She says that she now ready to commit to an insulin pump and sensor. so we again talked about the three most common pump options and two most common sensor options for patients with T1DM.  2.  Hypoglycemia: She had one low BG of 50 and several BGs in the 70s. In retrospect, the low BGs were predictable based upon being very busy and active at work and not taking the time to eat. A CGM would have alerted her to the low BGs. She feels the low BGs coming on if they drop rapidly, but does not always feel the low BGs that occur slowly and progressively until she is quite low.  3. Autonomic neuropathy with tachycardia and gastroparesis: Her heart rate is higher, c/w her higher BGs and with her Graves' disease exacerbation.      4-8 Autoimmune thyroid disease, Graves' disease, Hashimoto's disease, and goiter:   A. Thyrotoxicosis: Her diffuse thyrotoxicosis, secondary to Graves' disease, has waxed and waned over time.  Her thyrotoxicosis was well controlled by her current methimazole doses in January 2018 and again in February 2019. In October 2019, however, she was again hyperthyroid, so I asked her to increase the methimazole to 15 mg, twice daily. She heard 15 mg daily, so she has not had adequate treatment for her Berenice Primas' disease for two months. She needs more methimazole.  B. Hashimoto's thyroiditis: The patient's thyroiditis is clinically quiescent at this time. However, the pattern in which all 3 of her TFTs decreased in parallel together from 2013 to 2014 indicated that she had had a relatively recent flare up of Hashimoto's Dz. I expect that as her Hashimoto's disease progressively destroys more thyroid cells, we will be able to taper her methimazole to zero. She will probably become permanently hypothyroid over time and will require Synthroid hormone replacement.  C. Goiter: Thyroid gland is a bit larger today, but symmetrically so, c/w an exacerbation  of Graves Dz. The waxing and waning of thyroid lobe size is c/w both with evolving Hashimoto's disease and fluctuating Graves' Dz activity.  9. Celiac disease: Nakshatra is following her gluten-free diet and is doing well.  10.  Chronic inflammatory demyelinating polyneuropathy (CIDP) and muscle weakness: Her left leg is stronger, but her right leg remains very weak.    11-12. Fatigue and decreased stamina: These problems are better. Since she had been on a regimen of 1-2 iv glucocorticoid treatment per week for many months, it was possible that her adrenals were suppressed. However, her last ACTH and cortisol levels in November 2016 were normal. Her ACTH stimulation test results in July 2018 were also quite normal.  13. Emotionality: Her anxiety is much better today. If the anxiety worsens, I suggested a referral to Ms. Rea College, MSN.  14-15. Dyspepsia-GERD: She is doing fairly well overall. 16. Hypertension: Her BP is good today on her twice daily low-dose lisinopril regimen. Given Reniyah's muscular weakness and tendency to be physically unbalanced, it is not appropriate to increase her BP medication too much, otherwise she will have more falls.  17.  Adjustment reaction to illness and medical therapy: I continue to be impressed and inspired by Bryahna's dignity and courage as she tries to do all that she can to improve her own health.  PLAN: 1. Diagnostic: CBG today. I reviewed her annual surveillance lab results with her. Repeat TFTs in 4 weeks.  2. Therapeutic:   A. Continue the Lantus dose of 32 units as tolerated. Continue the current Novolog plan. Check BGs at meals and at bedtime every day.   B. Continue current doses of her endocrine meds, except increase the methimazole to 10 mg twice daily. .   C. She will contact our diabetes educator, Ms. Rebecca Eaton, CDE to have a demonstration of the three main pump and two main sensors. We will then order the pump and CGM of her choice. I  recommended the Medtronic 670G pump and Guardian 3 sensor, which I think will be the best choices for her.   3. Patient education: We discussed all of the above at great length.  4. Follow-up: 3 months in a new patient slot.   Level of Service: This visit lasted in excess of 65 minutes. More than 50% of the visit was devoted to counseling  Tillman Sers, MD, CDE Adult and Pediatric Endocrinology

## 2018-11-09 NOTE — Patient Instructions (Addendum)
Follow up visit in 3 months. Please increase methimazole dose now to 10 mg, twice daily. Repeat thyroid tests in 4 weeks.

## 2018-11-12 ENCOUNTER — Telehealth: Payer: Self-pay | Admitting: *Deleted

## 2018-11-12 MED ORDER — FLUCONAZOLE 150 MG PO TABS: 150.0000 mg | ORAL_TABLET | Freq: Every day | ORAL | 3 refills | Status: DC

## 2018-11-12 NOTE — Telephone Encounter (Signed)
Agree with Fluconazole 150 mg 1 tab PO daily x 3.  #3, refill x 3.

## 2018-11-12 NOTE — Telephone Encounter (Signed)
Patient called c/o vaginal itching and white discharge, reports using a new body wash prior to symptoms. Asked if diflucan tablet could be sent to pharmacy? Please advise

## 2018-11-12 NOTE — Telephone Encounter (Signed)
Rx sent, patient aware.

## 2018-11-18 ENCOUNTER — Other Ambulatory Visit: Payer: Self-pay

## 2018-11-18 ENCOUNTER — Encounter: Payer: Self-pay | Admitting: Obstetrics & Gynecology

## 2018-11-18 ENCOUNTER — Ambulatory Visit (INDEPENDENT_AMBULATORY_CARE_PROVIDER_SITE_OTHER): Payer: BC Managed Care – PPO | Admitting: Obstetrics & Gynecology

## 2018-11-18 VITALS — BP 124/78

## 2018-11-18 DIAGNOSIS — Z30431 Encounter for routine checking of intrauterine contraceptive device: Secondary | ICD-10-CM | POA: Diagnosis not present

## 2018-11-18 NOTE — Patient Instructions (Signed)
1. Encounter for routine checking of intrauterine contraceptive device (IUD) Mirena IUD well tolerated, in good position and no sign of infection.  Patient reassured.  Jessica Kelley, it was a pleasure seeing you today!

## 2018-11-18 NOTE — Progress Notes (Signed)
    Jessica Kelley Jul 23, 1990 [id number]        28 y.o.  G0 Married  RP:  Mirena IUD check 4 wks post insertion  HPI: Very well with Mirena IUD.  No BTB.  Currently having a menstrual period with less flow than usual.  No pelvic pain.  No vaginal discharge.  No fever.   OB History  Gravida Para Term Preterm AB Living  0 0 0 0 0 0  SAB TAB Ectopic Multiple Live Births  0 0 0 0 0    Past medical history,surgical history, problem list, medications, allergies, family history and social history were all reviewed and documented in the EPIC chart.   Directed ROS with pertinent positives and negatives documented in the history of present illness/assessment and plan.  Exam:  Vitals:   11/18/18 1626  BP: 124/78   General appearance:  Normal  Abdomen: Normal  Gynecologic exam: Vulva normal.  Speculum:  Cervix normal.  IUD strings visible at the EO.  Mild dark menstrual blood present.  No vaginal discharge.   Assessment/Plan:  28 y.o. G0  1. Encounter for routine checking of intrauterine contraceptive device (IUD) Mirena IUD well tolerated, in good position and no sign of infection.  Patient reassured.  Counseling on above issues and coordination of care >50% x 15 minutes.  Princess Bruins MD, 4:47 PM 11/18/2018

## 2018-11-19 ENCOUNTER — Encounter: Payer: Self-pay | Admitting: Anesthesiology

## 2019-01-30 ENCOUNTER — Emergency Department: Payer: BC Managed Care – PPO

## 2019-01-30 ENCOUNTER — Encounter: Payer: Self-pay | Admitting: Emergency Medicine

## 2019-01-30 ENCOUNTER — Other Ambulatory Visit: Payer: Self-pay

## 2019-01-30 ENCOUNTER — Emergency Department
Admission: EM | Admit: 2019-01-30 | Discharge: 2019-01-30 | Disposition: A | Discharge status: Other Acute Inpatient Hospital | Payer: BC Managed Care – PPO | Attending: Emergency Medicine | Admitting: Emergency Medicine

## 2019-01-30 DIAGNOSIS — W050XXA Fall from non-moving wheelchair, initial encounter: Secondary | ICD-10-CM | POA: Diagnosis not present

## 2019-01-30 DIAGNOSIS — Z20828 Contact with and (suspected) exposure to other viral communicable diseases: Secondary | ICD-10-CM | POA: Diagnosis not present

## 2019-01-30 DIAGNOSIS — E109 Type 1 diabetes mellitus without complications: Secondary | ICD-10-CM | POA: Diagnosis not present

## 2019-01-30 DIAGNOSIS — S065XAA Traumatic subdural hemorrhage with loss of consciousness status unknown, initial encounter: Secondary | ICD-10-CM

## 2019-01-30 DIAGNOSIS — Z79899 Other long term (current) drug therapy: Secondary | ICD-10-CM | POA: Insufficient documentation

## 2019-01-30 DIAGNOSIS — Z794 Long term (current) use of insulin: Secondary | ICD-10-CM | POA: Diagnosis not present

## 2019-01-30 DIAGNOSIS — Y999 Unspecified external cause status: Secondary | ICD-10-CM | POA: Insufficient documentation

## 2019-01-30 DIAGNOSIS — Y92093 Driveway of other non-institutional residence as the place of occurrence of the external cause: Secondary | ICD-10-CM | POA: Insufficient documentation

## 2019-01-30 DIAGNOSIS — S065X9A Traumatic subdural hemorrhage with loss of consciousness of unspecified duration, initial encounter: Secondary | ICD-10-CM | POA: Diagnosis not present

## 2019-01-30 DIAGNOSIS — S0990XA Unspecified injury of head, initial encounter: Secondary | ICD-10-CM | POA: Diagnosis present

## 2019-01-30 DIAGNOSIS — W19XXXA Unspecified fall, initial encounter: Secondary | ICD-10-CM

## 2019-01-30 DIAGNOSIS — Y939 Activity, unspecified: Secondary | ICD-10-CM | POA: Diagnosis not present

## 2019-01-30 DIAGNOSIS — E039 Hypothyroidism, unspecified: Secondary | ICD-10-CM | POA: Diagnosis not present

## 2019-01-30 DIAGNOSIS — S02119A Unspecified fracture of occiput, initial encounter for closed fracture: Secondary | ICD-10-CM | POA: Insufficient documentation

## 2019-01-30 LAB — CBC WITH DIFFERENTIAL/PLATELET
Abs Immature Granulocytes: 0.01 10*3/uL (ref 0.00–0.07)
Basophils Absolute: 0.1 10*3/uL (ref 0.0–0.1)
Basophils Relative: 2 %
Eosinophils Absolute: 0.1 10*3/uL (ref 0.0–0.5)
Eosinophils Relative: 2 %
HCT: 43.2 % (ref 36.0–46.0)
Hemoglobin: 14.3 g/dL (ref 12.0–15.0)
Immature Granulocytes: 0 %
Lymphocytes Relative: 26 %
Lymphs Abs: 0.9 10*3/uL (ref 0.7–4.0)
MCH: 29 pg (ref 26.0–34.0)
MCHC: 33.1 g/dL (ref 30.0–36.0)
MCV: 87.6 fL (ref 80.0–100.0)
Monocytes Absolute: 0.2 10*3/uL (ref 0.1–1.0)
Monocytes Relative: 6 %
Neutro Abs: 2.2 10*3/uL (ref 1.7–7.7)
Neutrophils Relative %: 64 %
Platelets: 199 10*3/uL (ref 150–400)
RBC: 4.93 MIL/uL (ref 3.87–5.11)
RDW: 13.6 % (ref 11.5–15.5)
WBC: 3.4 10*3/uL — ABNORMAL LOW (ref 4.0–10.5)
nRBC: 0 % (ref 0.0–0.2)

## 2019-01-30 LAB — COMPREHENSIVE METABOLIC PANEL
ALT: 19 U/L (ref 0–44)
AST: 34 U/L (ref 15–41)
Albumin: 3.3 g/dL — ABNORMAL LOW (ref 3.5–5.0)
Alkaline Phosphatase: 61 U/L (ref 38–126)
Anion gap: 10 (ref 5–15)
BUN: 8 mg/dL (ref 6–20)
CO2: 25 mmol/L (ref 22–32)
Calcium: 8.9 mg/dL (ref 8.9–10.3)
Chloride: 103 mmol/L (ref 98–111)
Creatinine, Ser: 0.48 mg/dL (ref 0.44–1.00)
GFR calc Af Amer: >60 mL/min (ref >60)
GFR calc non Af Amer: >60 mL/min (ref >60)
Glucose, Bld: 163 mg/dL — ABNORMAL HIGH (ref 70–99)
Potassium: 3.1 mmol/L — ABNORMAL LOW (ref 3.5–5.1)
Sodium: 138 mmol/L (ref 135–145)
Total Bilirubin: 0.4 mg/dL (ref 0.3–1.2)
Total Protein: 10.3 g/dL — ABNORMAL HIGH (ref 6.5–8.1)

## 2019-01-30 LAB — PROTIME-INR
INR: 0.9 (ref 0.8–1.2)
Prothrombin Time: 12.5 seconds (ref 11.4–15.2)

## 2019-01-30 LAB — GLUCOSE, CAPILLARY
Glucose-Capillary: 166 mg/dL — ABNORMAL HIGH (ref 70–99)
Glucose-Capillary: 155 mg/dL — ABNORMAL HIGH (ref 70–99)
Glucose-Capillary: 85 mg/dL (ref 70–99)

## 2019-01-30 LAB — SARS CORONAVIRUS 2 BY RT PCR (HOSPITAL ORDER, PERFORMED IN ~~LOC~~ HOSPITAL LAB): SARS Coronavirus 2: NEGATIVE

## 2019-01-30 MED ORDER — SODIUM CHLORIDE 0.9 % IV BOLUS
1000.0000 mL | Freq: Once | INTRAVENOUS | Status: AC
  Administered 2019-01-30: 1000 mL via INTRAVENOUS

## 2019-01-30 MED ORDER — ONDANSETRON HCL 4 MG/2ML IJ SOLN
4.0000 mg | Freq: Once | INTRAMUSCULAR | Status: AC
  Administered 2019-01-30: 4 mg via INTRAVENOUS
  Filled 2019-01-30: qty 2

## 2019-01-30 MED ORDER — FENTANYL CITRATE (PF) 100 MCG/2ML IJ SOLN
50.0000 ug | Freq: Once | INTRAMUSCULAR | Status: AC
  Administered 2019-01-30: 05:00:00 50 ug via INTRAVENOUS

## 2019-01-30 MED ORDER — FENTANYL CITRATE (PF) 100 MCG/2ML IJ SOLN
25.0000 ug | Freq: Once | INTRAMUSCULAR | Status: AC
  Administered 2019-01-30: 25 ug via INTRAVENOUS
  Filled 2019-01-30: qty 2

## 2019-01-30 MED ORDER — FENTANYL CITRATE (PF) 100 MCG/2ML IJ SOLN
INTRAMUSCULAR | Status: AC
  Filled 2019-01-30: qty 2

## 2019-01-30 NOTE — ED Provider Notes (Signed)
[facility] Emergency Department Provider Note   ____________________________________________   First MD Initiated Contact with Patient 01/30/19 0159     (approximate)  I have reviewed the triage vital signs and the nursing notes.   HISTORY  Chief Complaint Head Injury    HPI Jessica Kelley is a 28 y.o. female who presents to the ED from home status post fall with head injury.  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  She was wheeling herself up her driveway around midnight when she lost her balance and fell directly backwards, striking the back of her head.  Spouse saw this happened and states patient had brief LOC.  States patient has been confused since the fall.  However, as more time passes, spouse states patient sensorium is back to normal.  Patient complains of headache.  Denies recent fever, cough, chest pain, shortness of breath, abdominal pain, nausea or vomiting.  Currently denies dizziness.  Baseline weak right upper and lower extremity which is not worse than usual.  Denies use of anticoagulants.       Past Medical History:  Diagnosis Date  . Celiac disease   . 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 Diabetes type I ([place])    Dr. Tobe Sos  . Glaucoma   . Hypothyroidism   . Macular degeneration   . Pilonidal cyst   . Scoliosis   . 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 Strep throat 10/16/2017  . Chest pain 06/12/2016  . Palpitations 06/12/2016  . 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 Abnormality of gait 11/09/2012  . Migraine without aura 11/09/2012  . Chronic tension type headache 11/09/2012  . Insomnia, unspecified 11/09/2012  . Chronic lymphocytic thyroiditis 11/09/2012  . 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 Encounter for long-term (current) use of other medications 11/09/2012  . Thyrotoxicosis with diffuse goiter  02/25/2012  . Thyroiditis, autoimmune 02/25/2012  . 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 Goiter 02/25/2012  . Pilonidal cyst with abscess 12/12/2010  . UNSPECIFIED ANEMIA 03/27/2009  . VIRAL URI 06/14/2008  . ELEVATED BLOOD PRESSURE WITHOUT DIAGNOSIS OF HYPERTENSION 05/11/2008  . KNEE PAIN, RIGHT 01/26/2008  . UNSPECIFIED TACHYCARDIA 12/07/2007  . HEADACHE 06/04/2007  . HYPOTHYROIDISM NOS 02/17/2007  . FOOT PAIN, RIGHT 02/17/2007  . SCOLIOSIS NEC 02/17/2007  . DIABETES MELLITUS, TYPE I 10/07/2006  . DMYLNATING POLYNEURITIS, CHRONIC INFLAMMATORY 10/07/2006  . MACULAR DEGENERATION 10/07/2006  . DISEASE, CELIAC 10/07/2006    Past Surgical History:  Procedure Laterality Date  . CATARACT EXTRACTION  06/25/06  . FOOT ARTHROPLASTY     left foot moved and stretched tendons   . GLAUCOMA SURGERY  02/12/06  . HIP ARTHROPLASTY     right never bipopsy  . PORTACATH PLACEMENT  2002-2004  . removal of vascular port    . Sciatic Nerve Muscle Biopsy      Prior to Admission medications   Medication Sig Start Date End Date Taking? Authorizing Provider  Blood Glucose Monitoring Suppl (CONTOUR NEXT MONITOR) w/Device KIT 1 Units by Does not apply route daily. 12/30/16   Sherrlyn Hock, MD  Cholecalciferol (VITAMIN D) 1000 UNITS capsule Take 1,000 Units by mouth daily.      [provider]  Cyanocobalamin (VITAMIN B-12 PO) Take by mouth.    [provider]  fluconazole (DIFLUCAN) 150 MG tablet Take 1 tablet (150 mg total) by mouth daily. 11/12/18  Princess Bruins, MD  fluticasone [facility]) 50 MCG/ACT nasal spray Place 2 sprays into both nostrils daily. 10/02/17   Saguier, Percell Miller, PA-C  GLUCAGON EMERGENCY 1 MG injection USE AS DIRECTED 01/25/16   Sherrlyn Hock, MD  glucose blood (CONTOUR NEXT TEST) test strip Use to check glucose 6x daily 12/30/16   Sherrlyn Hock, MD  Immune Globulin, Human, 5 GM/50ML SOLN Inject into the vein.     [provider]  Insulin Aspart FlexPen 100 UNIT/ML SOPN INJECT 50 UNITS SUBCUTANEOUSLY ONCE DAILY 08/31/18   Sherrlyn Hock, MD  Insulin Glargine (LANTUS SOLOSTAR) 100 UNIT/ML Solostar Pen INJECT UP TO 50 UNITS SUBCUTANEOUSLY ONCE DAILY 05/06/18   Sherrlyn Hock, MD  Insulin Pen Needle (INSUPEN PEN NEEDLES) 32G X 4 MM MISC Inject insulin via insulin pen 7 x daily 09/11/17   Sherrlyn Hock, MD  lisinopril (ZESTRIL) 2.5 MG tablet Take 1 tablet by mouth once daily 10/30/18   Sherrlyn Hock, MD  methimazole (TAPAZOLE) 5 MG tablet Take 3 tablets, twice daily. 11/09/18 11/09/19  Sherrlyn Hock, MD  methylPREDNISolone sodium succinate (SOLU-MEDROL) 500 MG injection Give 532m IV in 50 ml NS over 1 hour x 1 every 4 weeks. Given by ABevil Oaks3/17/15   GRockwell Germany NP  Multiple Vitamins-Minerals (MULTIVITAMIN WITH MINERALS) tablet Take by mouth.    [provider]  mycophenolate (CELLCEPT) 500 MG tablet TAKE 2 TABLETS BY MOUTH TWICE DAILY (PATIENT MUST CALL FOR APPOINTMENTFOR FURTHER REFILLS) Patient taking differently: TAKE 1,000MG BY MOUTH TWICE DAILY (PATIENT MUST CALL FOR APPOINTMENTFOR FURTHER REFILLS) 12/16/16   HJodi Geralds MD  propranolol (INDERAL) 20 MG tablet TAKE 1 TABLET BY MOUTH THREE TIMES DAILY 02/27/17   BSherrlyn Hock MD    Allergies Gluten meal and Wheat bran  Family History  Problem Relation Age of Onset  . Prostate cancer Maternal Grandfather   . Cancer Maternal Grandfather        prostate  . Coronary artery disease Paternal Grandfather   . Diabetes Sister   . Other Sister        thyroid issues  . Thyroid disease Mother   . Cancer Paternal Grandmother        Died at 658- kidney  . Hyperlipidemia Other     Social History Social History   Tobacco Use  . Smoking status: Never Smoker  . Smokeless tobacco: Never Used  . Tobacco comment: passive smoke exposure  Substance Use Topics  . Alcohol use: Yes     Alcohol/week: 0.0 standard drinks    Comment: OCC  . Drug use: No    Review of Systems  Constitutional: No fever/chills Eyes: No visual changes. ENT: No sore throat. Cardiovascular: Denies chest pain. Respiratory: Denies shortness of breath. Gastrointestinal: No abdominal pain.  No nausea, no vomiting.  No diarrhea.  No constipation. Genitourinary: Negative for dysuria. Musculoskeletal: Negative for back pain. Skin: Negative for rash. Neurological: Positive for headache.  Negative for focal weakness or numbness.   ____________________________________________   PHYSICAL EXAM:  VITAL SIGNS: ED Triage Vitals  Enc Vitals Group     BP 01/30/19 0111 121/74     Pulse Rate 01/30/19 0111 (!) 120     Resp 01/30/19 0111 18     Temp 01/30/19 0111 98 F (36.7 C)     Temp src --      SpO2 01/30/19 0111 96 %     Weight 01/30/19 0114 130 lb (59 kg)  Height 01/30/19 0114 5' 4"  (1.626 m)     Head Circumference --      Peak Flow --      Pain Score 01/30/19 0114 10     Pain Loc --      Pain Edu? --      Excl. in [place]? --     Constitutional: Alert and oriented. Well appearing and in mild acute distress. Eyes: Conjunctivae are normal. PERRL. EOMI. Head: Small occipital hematoma. Nose: Atraumatic. Mouth/Throat: Mucous membranes are moist.  No dental malocclusion. Neck: No stridor.  No cervical spine tenderness to palpation.  No step-offs or deformities. Cardiovascular: Normal rate, regular rhythm. Grossly normal heart sounds.  Good peripheral circulation. Respiratory: Normal respiratory effort.  No retractions. Lungs CTAB. Gastrointestinal: Soft and nontender. No distention. No abdominal bruits. No CVA tenderness. Musculoskeletal: No lower extremity tenderness nor edema.  No joint effusions. Neurologic: Alert and oriented x3.  GCS 15.  Normal speech and language.  Baseline RUE and RLE weakness.   Skin:  Skin is warm, dry and intact. No rash noted. Psychiatric: Mood and affect are  normal. Speech and behavior are normal.  ____________________________________________   LABS (all labs ordered are listed, but only abnormal results are displayed)  Labs Reviewed  GLUCOSE, CAPILLARY - Abnormal; Notable for the following components:      Result Value   Glucose-Capillary 166 (*)    All other components within normal limits  CBC WITH DIFFERENTIAL/PLATELET - Abnormal; Notable for the following components:   WBC 3.4 (*)    All other components within normal limits  COMPREHENSIVE METABOLIC PANEL - Abnormal; Notable for the following components:   Potassium 3.1 (*)    Glucose, Bld 163 (*)    Total Protein 10.3 (*)    Albumin 3.3 (*)    All other components within normal limits  GLUCOSE, CAPILLARY - Abnormal; Notable for the following components:   Glucose-Capillary 155 (*)    All other components within normal limits  SARS CORONAVIRUS 2 (HOSPITAL ORDER, [facility])  PROTIME-INR  GLUCOSE, CAPILLARY  URINALYSIS, COMPLETE (UACMP) WITH MICROSCOPIC  POC URINE PREG, ED  TYPE AND SCREEN   ____________________________________________  EKG  ED ECG REPORT I, Lamis Behrmann J, the attending physician, personally viewed and interpreted this ECG.   Date: 01/30/2019  EKG Time: 0213  Rate: 112  Rhythm: sinus tachycardia  Axis: Normal  Intervals:none  ST&T Change: Nonspecific  ____________________________________________  RADIOLOGY  ED MD interpretation: CT head demonstrates occipital scalp contusion and fracture; right subdural hemorrhage, contrecoup type injury with SAH over both frontal convexities; no cervical spine injury; no acute cardiopulmonary process  Official radiology report(s): Ct Head Wo Contrast  Result Date: 01/30/2019 CLINICAL DATA:  [place] backwards from wheelchair with positive head strike, loss of consciousness EXAM: CT HEAD WITHOUT CONTRAST TECHNIQUE: Contiguous axial images were obtained from the base of the skull through the  vertex without intravenous contrast. COMPARISON:  CT 05/03/2012 FINDINGS: Brain: 5 mm hyperdense subdural collection over the right frontal convexity (2/12) additional serpiginous hyperattenuation is seen along the sulci of the right and left frontal lobes. Question a small amount of hyperdense thickening along the right tentorium (5/13) which could reflect additional subdural hemorrhage. No resulting mass effect or midline shift. No CT evidence of acute infarction. Vascular: No hyperdense vessel or increased attenuation within the dural venous sinuses. Skull: Nondisplaced vertically-oriented fracture of the occipital bone extending into the posterior foramen magnum. There is occipital scalp swelling and infiltration. No  large subgaleal hematoma. Sinuses/Orbits: Paranasal sinuses and mastoid air cells are predominantly clear. Included orbital structures are unremarkable. Other: None. IMPRESSION: Occipital scalp contusion with subjacent nondisplaced fracture the occipital bone. Fracture line traverses the dural sinus confluence. No abnormal expansion or hyperattenuation within the dural venous sinus though. Recommend further evaluation with spinal imaging and may consider further evaluation with venography given traversal of the sinus. Contrecoup type injury with subarachnoid hemorrhage over both frontal convexities and a small subdural collection along the right frontal convexity. Suspect small amount of subdural hemorrhage along the right tentorium as well. Critical Value/emergent results were called by telephone at the time of interpretation on 01/30/2019 at 1:59 am to providerJADE Brittinie Wherley , who verbally acknowledged these results. Electronically Signed   By: Lovena Le M.D.   On: 01/30/2019 02:00   Ct Cervical Spine Wo Contrast  Result Date: 01/30/2019 CLINICAL DATA:  Fall from wheelchair EXAM: CT CERVICAL SPINE WITHOUT CONTRAST TECHNIQUE: Multidetector CT imaging of the cervical spine was performed without  intravenous contrast. Multiplanar CT image reconstructions were also generated. COMPARISON:  None. FINDINGS: Alignment: No static subluxation. Facets are aligned. Occipital condyles and the lateral masses of C1 and C2 are normally approximated. Skull base and vertebrae: No cervical spine fracture. Parasagittal fracture of the occipital bone extending to the foramen magnum is again noted. Soft tissues and spinal canal: No prevertebral fluid or swelling. No visible canal hematoma. Disc levels: No advanced spinal canal or neural foraminal stenosis. Upper chest: No pneumothorax, pulmonary nodule or pleural effusion. Other: Normal visualized paraspinal cervical soft tissues. IMPRESSION: No acute fracture or static subluxation of the cervical spine. Electronically Signed   By: Ulyses Jarred M.D.   On: 01/30/2019 02:59   Dg Chest Port 1 View  Result Date: 01/30/2019 CLINICAL DATA:  Fall from wheelchair. EXAM: PORTABLE CHEST 1 VIEW COMPARISON:  Radiograph 12/20/2016 FINDINGS: The cardiomediastinal contours are normal. The lungs are clear. Pulmonary vasculature is normal. No consolidation, pleural effusion, or pneumothorax. Scoliotic curvature of the thoracic spine. No acute osseous abnormalities are seen. IMPRESSION: Clear lungs. No acute traumatic injury. Electronically Signed   By: Keith Rake M.D.   On: 01/30/2019 02:59    ____________________________________________   PROCEDURES  Procedure(s) performed (including Critical Care):  Procedures  CRITICAL CARE Performed by: Paulette Blanch   Total critical care time: 30 minutes  Critical care time was exclusive of separately billable procedures and treating other patients.  Critical care was necessary to treat or prevent imminent or life-threatening deterioration.  Critical care was time spent personally by me on the following activities: development of treatment plan with patient and/or surrogate as well as nursing, discussions with consultants,  evaluation of patient's response to treatment, examination of patient, obtaining history from patient or surrogate, ordering and performing treatments and interventions, ordering and review of laboratory studies, ordering and review of radiographic studies, pulse oximetry and re-evaluation of patient's condition. ____________________________________________   INITIAL IMPRESSION / ASSESSMENT AND PLAN / ED COURSE  As part of my medical decision making, I reviewed the following data within the [place] History obtained from family, Nursing notes reviewed and incorporated, Labs reviewed, Old chart reviewed, Radiograph reviewed and Notes from prior ED visits     Jessica Kelley was evaluated in Emergency Department on 01/30/2019 for the symptoms described in the history of present illness. She was evaluated in the context of the global COVID-19 pandemic, which necessitated consideration that the patient might be at risk for infection with  the SARS-CoV-2 virus that causes COVID-19. Institutional protocols and algorithms that pertain to the evaluation of patients at risk for COVID-19 are in a state of rapid change based on information released by regulatory bodies including the CDC and federal and state organizations. These policies and algorithms were followed during the patient's care in the ED.    28 year old female who is wheelchair-bound secondary to demyelinating disease who presents status post fall with occipital head injury, brief LOC and initial GCS 14.  Differential diagnosis includes but is not limited to [place], SDH, skull fracture, cervical spine injury, etc.   Clinical Course as of Jan 29 500  Sat Jan 30, 2019  0209 Discussed results of CT head with Dr. Marguerita Merles from radiology who recommends C-spine imaging.  Have updated patient and spouse.  She receives care both from [facility]; she will decide where she wishes to be transferred.   [JS]  0309 Updated patient and  spouse of CT cervical spine result.  She has actually never seen a specialist at [facility].  Will effect transfer to [facility].   [JS]  [phone] Patient accepted by Dr. Tad Moore to [facility] emergency department.  Patient and spouse updated.   [JS]  0501 Patient transferred with [facility] in stable condition; c-collar in place.   [JS]    Clinical Course User Index [JS] Paulette Blanch, MD     ____________________________________________   FINAL CLINICAL IMPRESSION(S) / ED DIAGNOSES  Final diagnoses:  Injury of head, initial encounter  Fall, initial encounter  Closed fracture of occipital bone, unspecified laterality, unspecified occipital fracture type, initial encounter ([place])  SDH (subdural hematoma) [facility])     ED Discharge Orders    None       Note:  This document was prepared using Dragon voice recognition software and may include unintentional dictation errors.   Paulette Blanch, MD 01/30/19 [phone]

## 2019-01-30 NOTE — ED Triage Notes (Addendum)
Patient brought in by spouse. Patient was in her wheelchair and fell back and hit her head. Spouse states that patient has been confused since the fall and thinks that she was +LOC. Patient alert and oriented times three at this time.

## 2019-01-30 NOTE — ED Notes (Signed)
[facility] ground transport ETA 1hr

## 2019-01-30 NOTE — ED Notes (Signed)
Accepted to [facility]

## 2019-01-30 NOTE — ED Notes (Signed)
Patient transported to CT 

## 2019-01-31 DIAGNOSIS — S065XAA Traumatic subdural hemorrhage with loss of consciousness status unknown, initial encounter: Secondary | ICD-10-CM | POA: Insufficient documentation

## 2019-01-31 MED ORDER — Medication
Status: DC
Start: ? — End: 2019-01-31

## 2019-01-31 MED ORDER — EQUATE NICOTINE 4 MG MT GUM
4.00 | CHEWING_GUM | OROMUCOSAL | Status: DC
Start: ? — End: 2019-01-31

## 2019-01-31 MED ORDER — DM-GG-POT CITRATE-CITRIC ACID PO
500.00 | ORAL | Status: DC
Start: 2019-01-31 — End: 2019-01-31

## 2019-01-31 MED ORDER — SODIUM CHLORIDE 0.9 % IV SOLN
INTRAVENOUS | Status: DC
Start: ? — End: 2019-01-31

## 2019-01-31 MED ORDER — ESTROPLUS PO TABS
1.00 | ORAL_TABLET | ORAL | Status: DC
Start: ? — End: 2019-01-31

## 2019-02-01 ENCOUNTER — Telehealth (INDEPENDENT_AMBULATORY_CARE_PROVIDER_SITE_OTHER): Payer: Self-pay | Admitting: Pediatrics

## 2019-02-01 ENCOUNTER — Telehealth (INDEPENDENT_AMBULATORY_CARE_PROVIDER_SITE_OTHER): Payer: Self-pay | Admitting: Radiology

## 2019-02-01 NOTE — Telephone Encounter (Signed)
  Who's calling (name and relationship to patient) : Mom    Best contact number: [phone]  Provider they see: Dr Gaynell Face   Reason for call: Caller stated patient fell, and is in [facility] with skull fracture. They were wanting to send her to another hospital but not sure where to send her. She wanted to know where the best place was.  Provider did advise.     PRESCRIPTION REFILL ONLY  Name of prescription:  Pharmacy:

## 2019-02-01 NOTE — Telephone Encounter (Signed)
This happened over the weekend and the patient was sent to [facility] from [facility].  She is on the neurosurgery service.  I will follow up to find out why this happened,  She is doing well.

## 2019-02-01 NOTE — Telephone Encounter (Signed)
  Who's calling (name and relationship to patient) : Jessica Kelley - Mom   Best contact number: [phone]  Provider they see: Dr Gaynell Face   Reason for call:  Mom called stating the Patient flipped out of her wheelchair and fractured her skull. The patient wanted to know what recommendations could be made for treatment for after trauma to the head. She is experiencing horrible headaches.    PRESCRIPTION REFILL ONLY  Name of prescription:  Pharmacy:

## 2019-02-01 NOTE — Telephone Encounter (Signed)
°  Who's calling (name and relationship to patient) : Langley Gauss ([place] RN) Best contact number: [phone] Provider they see: Dr. Gaynell Face Reason for call: Langley Gauss would like to speak with Tiffanie as soon as possible today regarding pt's infusions.

## 2019-02-01 NOTE — Telephone Encounter (Signed)
Let her know that once she's no longer symptomatic from her concussion, she should restart her IVIG.  If she needs something else, let me know.

## 2019-02-01 NOTE — Telephone Encounter (Signed)
Spoke with Langley Gauss about the patient's infusions. Advised her to discontinue the infusions until further notice from Dr. Gaynell Face

## 2019-02-03 ENCOUNTER — Telehealth (INDEPENDENT_AMBULATORY_CARE_PROVIDER_SITE_OTHER): Payer: Self-pay | Admitting: Pediatrics

## 2019-02-03 NOTE — Telephone Encounter (Signed)
Who's calling (name and relationship to patient) : Eiza Canniff (self)  Best contact number: [phone]  Provider they see: Dr. Gaynell Face  Reason for call:  Deissy called in stating that last Friday she fell and fractured her skull. During this time she was unable to communicate with Dr. Gaynell Face, but mom and provider have been in contact. Kerisha is needing a letter for her work regarding this injury. Please have Dr. Gaynell Face call [place] regarding what is needed in that letter.  Call ID:      PRESCRIPTION REFILL ONLY  Name of prescription:  Pharmacy:

## 2019-02-03 NOTE — Telephone Encounter (Signed)
She still has severe headaches but her mind is clearing.  Otherwise she is pretty close to baseline.  I do want to write a letter until it looks like she is ready to go back to work and I do not think that is going to be the case until the headaches are better.  She needs to restart her IVIG within the next week.  I asked her to keep in touch with me.

## 2019-02-09 DIAGNOSIS — S069XAA Unspecified intracranial injury with loss of consciousness status unknown, initial encounter: Secondary | ICD-10-CM | POA: Insufficient documentation

## 2019-03-15 ENCOUNTER — Other Ambulatory Visit (INDEPENDENT_AMBULATORY_CARE_PROVIDER_SITE_OTHER): Payer: Self-pay | Admitting: "Endocrinology

## 2019-03-16 ENCOUNTER — Other Ambulatory Visit (INDEPENDENT_AMBULATORY_CARE_PROVIDER_SITE_OTHER): Payer: Self-pay | Admitting: *Deleted

## 2019-03-16 ENCOUNTER — Other Ambulatory Visit: Payer: Self-pay

## 2019-03-16 MED ORDER — NOVOLOG FLEXPEN 100 UNIT/ML ~~LOC~~ SOPN: PEN_INJECTOR | SUBCUTANEOUS | 5 refills | Status: DC

## 2019-03-16 MED ORDER — NOVOLOG FLEXPEN 100 UNIT/ML ~~LOC~~ SOPN: PEN_INJECTOR | SUBCUTANEOUS | 1 refills | Status: DC

## 2019-04-01 ENCOUNTER — Other Ambulatory Visit (INDEPENDENT_AMBULATORY_CARE_PROVIDER_SITE_OTHER): Payer: Self-pay | Admitting: "Endocrinology

## 2019-04-27 ENCOUNTER — Telehealth (INDEPENDENT_AMBULATORY_CARE_PROVIDER_SITE_OTHER): Payer: Self-pay | Admitting: Pediatrics

## 2019-04-27 NOTE — Telephone Encounter (Signed)
I called the patient and apparently the group providing authorization for the use of Gammagard requires a return visit less than a year old.  I last saw Jessica Kelley in August 2019.  I urged her to call for an appointment.  We can fit her in tomorrow, but I have full schedules on Thursday, Friday, and am out of town the week after.

## 2019-05-11 ENCOUNTER — Other Ambulatory Visit: Payer: Self-pay

## 2019-05-11 ENCOUNTER — Encounter (INDEPENDENT_AMBULATORY_CARE_PROVIDER_SITE_OTHER): Payer: Self-pay | Admitting: Pediatrics

## 2019-05-11 ENCOUNTER — Ambulatory Visit (INDEPENDENT_AMBULATORY_CARE_PROVIDER_SITE_OTHER): Payer: BC Managed Care – PPO | Admitting: Pediatrics

## 2019-05-11 VITALS — BP 130/90 | HR 104 | Ht 63.5 in | Wt 135.8 lb

## 2019-05-11 DIAGNOSIS — G6181 Chronic inflammatory demyelinating polyneuritis: Secondary | ICD-10-CM

## 2019-05-11 DIAGNOSIS — R030 Elevated blood-pressure reading, without diagnosis of hypertension: Secondary | ICD-10-CM

## 2019-05-11 NOTE — Patient Instructions (Signed)
I am happy that you are doing well, that you are medically and neurologically stable, that your job is going well and that you are married and happy.  When you see you in a year.  We will take care of anything that has to be done to safeguard your Gammagard.  I am not sure if you are going to benefit from the Covid vaccine because of the IVIG.  But I do not think that it will harm you.  Check with Dr.Dyck.

## 2019-05-11 NOTE — Progress Notes (Signed)
Patient: Jessica Kelley MRN: [id number] Sex: female DOB: 1990/12/12  Provider: Wyline Copas, MD Location of Care: [facility]  Note type: Routine return visit  History of Present Illness: Referral Source: Juliann Pares, DO History from: mother, patient and [facility] chart Chief Complaint: Chronic inflammatory dmyelinating polyneuritis  Jessica Kelley is a 28 y.o. female who returns May 11, 2019 for the first time since December 31, 2017.  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  She was followed at [facility] for a number of years and responded to immunotherapy with IVIG and Solu-Medrol which stabilized her strength.  On her last visit she requested transfer from [facility] to [facility]  I looked in Care Everywhere and Dr. Gustavus Bryant has seen her.  There was an initial consultation for this condition on March 31, 2018.  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  Despite this Dr. 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    I do not think further work-up is taken place.  She had a closed head injury January 31, 2019 when she lost balance and fell striking her head causing a left occipital skull fracture and subdural hematoma.  MRI scan February 07, 2019 showed a 7 mm right cerebral convexity hematoma that had increased from 4 to 5 mm.  She also had some parenchymal hematomas in the right greater than left gyri recti with surrounding vasogenic edema, extra-axial include collections in the floor of the anterior cranial fossa anterior aspect of the right temporal lobe and  posterior margin of the clivus, and left sphenoid sinus.  She was out of work for 2 months.  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  She got married.  Since her head injury she uses her wheelchair more at work to propel her self around but does get up to walk daily using her arm canes.  She is here today because she needs to be seen once a year to have her IVIG renewed.  Review of Systems: A complete review of systems was remarkable for patient is here for chronic inflammatory dmyelinating polyneuritis. She reports no concerns at this time, all other systems reviewed and negative.  Past Medical History Past Medical History:  Diagnosis Date  . Celiac disease   . CIDP (chronic inflammatory demyelinating polyneuropathy) ([place]) 2001  . Diabetes type I ([place])    Dr. Tobe Sos  . Glaucoma   . Hypothyroidism   . Macular degeneration   . Pilonidal cyst   . Scoliosis   . Wyburn-Mason syndrome    Hospitalizations: No., Head Injury: No., Nervous System Infections: No., Immunizations up to date: Yes.    Detailed information from previous evaluations at [facility] are in the media section and from [place] and [facility] in [place].  CT scan of  the brain May 03, 2012 was negative  MRI of the pelvis at [facility] October 25, 2014 showed stable thickening and increased T2 hyperintensity within the exiting nerve roots and lumbosacral plexus and sciatic nerves.  Thickening and increased T2 intensity of the left lateral femoral cutaneous and intrapelvic femoral nerves was less conspicuous.  Birth History 7 pound 9  ounce infant born at full-term.  Gestation was uncomplicated.  Labor lasted 23 hours.  Operative delivery with forceps.  Nursery course was uneventful.  Growth and development is recalled as normal.  Behavior History none  Surgical History Procedure Laterality Date  . CATARACT EXTRACTION  06/25/06  . FOOT ARTHROPLASTY     left foot moved and stretched tendons   . GLAUCOMA SURGERY  02/12/06  . HIP ARTHROPLASTY     right never bipopsy  . PORTACATH PLACEMENT  2002-2004  . removal of vascular port    . Sciatic Nerve Muscle Biopsy     Family History family history includes Cancer in her maternal grandfather and paternal grandmother; Coronary artery disease in her paternal grandfather; Diabetes in her sister; Hyperlipidemia in an other family member; Other in her sister; Prostate cancer in her maternal grandfather; Thyroid disease in her mother. Family history is negative for migraines, seizures, intellectual disabilities, blindness, deafness, birth defects, chromosomal disorder, or autism.  Social History Socioeconomic History  . Marital status:  Married  . Years of education:  91  . Highest education level:  High school graduate  Occupational History  . Works as a Chartered certified accountant at [employer]  . Smoking status: Never Smoker  . Smokeless tobacco: Never Used  . Tobacco comment: passive smoke exposure  Substance and Sexual Activity  . Alcohol use: Yes    Alcohol/week: 0.0 standard drinks    Comment: OCC  . Drug use: No  . Sexual activity:  Single female partner  Social History Narrative    Lillan is a 28 yo woman     She is employed with [employer].    She lives with her husband and has one sister, 30 yo.    She enjoys working, camping, and walking outside.   Allergies Allergen Reactions  . Gluten Meal   . Wheat Bran    Physical Exam BP 130/90   Pulse (!) 104   Ht 5' 3.5" (1.613 m)   Wt 135 lb 12.8 oz (61.6 kg)   BMI  23.68 kg/m   General: alert, well developed, well nourished, in no acute distress, sandy hair, blue eyes, left handed Head: normocephalic, no dysmorphic features Ears, Nose and Throat: Otoscopic: tympanic membranes normal; pharynx: oropharynx is pink without exudates or tonsillar hypertrophy Neck: supple, full range of motion, no cranial or cervical bruits Respiratory: auscultation clear Cardiovascular: no murmurs, pulses are normal Musculoskeletal: no skeletal deformities or apparent scoliosis Skin: no rashes or neurocutaneous lesions  Neurologic Exam  Mental Status: alert; oriented to person, place and year; knowledge is normal for age; language is normal Cranial Nerves: visual fields are full to double simultaneous stimuli; visual acuity is poor in her left eye; extraocular movements are full and conjugate; pupils are round reactive to light with a left afferent pupillary defect; funduscopic examination shows sharp disc margins with normal vessels on the right, and dilated vessels with multiple small vessels in the left fundus consistent with a condition known as Wyburn-Mason; symmetric facial strength; midline tongue and uvula; air conduction is greater than bone conduction bilaterally Motor: Normal  strength in her sternocleidomastoid neck flexors and extensors 4+, right deltoid 4+, left 4, right triceps 5, left triceps 4+, right supraspinatus 4, left 4 -, pronator teres 4+, supinator 4, right pectoralis 5, left 4+ distal arms 5/5 with good fine motor movements; no pronator drift; lower extremities left hip flexor 3, right 1, left knee extensor 4 -, right 0, left foot dorsiflexor 4 -, right 0-1, left plantar flexor 3, right 0 Sensory: intact responses to cold, vibration, proprioception and stereognosis Coordination: good finger-to-nose, rapid repetitive alternating movements and finger apposition Gait and Station: I did not test her gait today, but in the past she has a diplegia gait with  steppage on the right greater than left, external rotation of her legs, broad-based, requirement to hold onto a walker or canes for stability Reflexes: symmetric and absent bilaterally; no clonus; bilateral neutral plantar responses  Assessment 1.  Demyelinating polyneuritis, chronic inflammatory, G61.81. 2.  History of intracranial hemorrhage following a fall with skull fracture 3.  Elevated blood pressure without diagnosis of hypertension 4.  Diabetes mellitus, type I 5.  Autoimmune thyroiditis  Discussion Shomari is stable.  Despite her many problems, there has been no significant change in her examination over the past 16 months.  Plan For the time being she needs to continue her IVIG.  I will defer to Dr. Gustavus Bryant concerning changes in her immunotherapy.  We discussed immunization for coronavirus.  I do not think that she will have a problem with it, but the question is going to be whether she will have any benefit from it while receiving IVIG treatments.  She is to return to see me in a year as long as I am responsible for prescribing her IVIG.   Medication List   Accurate as of May 11, 2019 11:59 PM. If you have any questions, ask your nurse or doctor.      TAKE these medications   Contour Next Monitor w/Device Kit 1 Units by Does not apply route daily.   Glucagon Emergency 1 MG Kit USE AS DIRECTED   glucose blood test strip Commonly known as: Contour Next Test Use to check glucose 6x daily   Immune Globulin (Human) 5 GM/50ML Soln Inject into the vein.   Insulin Pen Needle 32G X 4 MM Misc Commonly known as: Insupen Pen Needles Inject insulin via insulin pen 7 x daily   Lantus SoloStar 100 UNIT/ML Solostar Pen Generic drug: Insulin Glargine INJECT UP TO 50 UNITS SUBCUTANEOUSLY ONCE DAILY   lisinopril 2.5 MG tablet Commonly known as: ZESTRIL Take 1 tablet by mouth once daily   methimazole 5 MG tablet Commonly known as: TAPAZOLE Take 3 tablets,  twice daily.   methylPREDNISolone sodium succinate 500 MG injection Commonly known as: SOLU-MEDROL Give 579m IV in 50 ml NS over 1 hour x 1 every 4 weeks. Given by [facility]   multivitamin with minerals tablet Take by mouth.   NovoLOG FlexPen 100 UNIT/ML FlexPen Generic drug: insulin aspart Inject up to 50 units daily   propranolol 20 MG tablet Commonly known as: INDERAL TAKE 1 TABLET BY MOUTH THREE TIMES DAILY   VITAMIN B-12 PO Take by mouth.   Vitamin D 1000 units capsule Take 1,000 Units by mouth daily.     The medication list was reviewed and reconciled. All changes or newly prescribed medications were explained.  A complete medication list was provided to the patient/caregiver.  WJodi GeraldsMD

## 2019-05-24 ENCOUNTER — Ambulatory Visit: Payer: BC Managed Care – PPO | Attending: Internal Medicine

## 2019-05-24 ENCOUNTER — Telehealth (INDEPENDENT_AMBULATORY_CARE_PROVIDER_SITE_OTHER): Payer: Self-pay | Admitting: "Endocrinology

## 2019-05-24 DIAGNOSIS — Z20822 Contact with and (suspected) exposure to covid-19: Secondary | ICD-10-CM

## 2019-05-24 NOTE — Telephone Encounter (Signed)
Please advise 

## 2019-05-24 NOTE — Telephone Encounter (Signed)
  Who's calling (name and relationship to patient) : Batina Dougan   Best contact number: [phone]  Provider they see: Dr Tobe Sos   Reason for call: Darwin called to advise that her husband is positive for Covid and she was just tested but hasn't received her results yet. She is experiencing some sore throat and runny nose. Patient would like to speak with Dr Tobe Sos for some advice incase she does get it     PRESCRIPTION REFILL ONLY  Name of prescription:  Pharmacy:

## 2019-05-25 ENCOUNTER — Telehealth: Payer: Self-pay | Admitting: "Endocrinology

## 2019-05-25 NOTE — Telephone Encounter (Signed)
1. Jessica Kelley called with a  question and an update. 2. She had an accident at home on 01/29/19 that caused a brain injury. She is now recovered. She will come to see me next week.  3. Her husband tested positive for covid yesterday. She has similar symptoms with nasal congestion and a slight sore throat. She was tested yesterday. She wants to know what she should do if she does have covid. 4. I told her that from a DM point, she should take extra insulin as her BGs indicate she should do. If her test is positive, she should contact her PCP for further instructions. I also suggested that if her symptoms get worse, she should go to the [facility] ED.  Tillman Sers, MD, CDE

## 2019-05-26 LAB — NOVEL CORONAVIRUS, NAA: SARS-CoV-2, NAA: DETECTED — AB

## 2019-05-29 ENCOUNTER — Other Ambulatory Visit (INDEPENDENT_AMBULATORY_CARE_PROVIDER_SITE_OTHER): Payer: Self-pay | Admitting: "Endocrinology

## 2019-06-08 DIAGNOSIS — E103391 Type 1 diabetes mellitus with moderate nonproliferative diabetic retinopathy without macular edema, right eye: Secondary | ICD-10-CM | POA: Insufficient documentation

## 2019-06-23 ENCOUNTER — Other Ambulatory Visit: Payer: Self-pay

## 2019-06-23 ENCOUNTER — Ambulatory Visit (INDEPENDENT_AMBULATORY_CARE_PROVIDER_SITE_OTHER): Payer: BC Managed Care – PPO | Admitting: "Endocrinology

## 2019-06-23 ENCOUNTER — Encounter (INDEPENDENT_AMBULATORY_CARE_PROVIDER_SITE_OTHER): Payer: Self-pay | Admitting: "Endocrinology

## 2019-06-23 VITALS — BP 124/82 | HR 92 | Ht 63.5 in | Wt 137.4 lb

## 2019-06-23 DIAGNOSIS — R5383 Other fatigue: Secondary | ICD-10-CM

## 2019-06-23 DIAGNOSIS — E109 Type 1 diabetes mellitus without complications: Secondary | ICD-10-CM | POA: Diagnosis not present

## 2019-06-23 DIAGNOSIS — E063 Autoimmune thyroiditis: Secondary | ICD-10-CM

## 2019-06-23 DIAGNOSIS — K900 Celiac disease: Secondary | ICD-10-CM

## 2019-06-23 DIAGNOSIS — E0500 Thyrotoxicosis with diffuse goiter without thyrotoxic crisis or storm: Secondary | ICD-10-CM | POA: Diagnosis not present

## 2019-06-23 DIAGNOSIS — I10 Essential (primary) hypertension: Secondary | ICD-10-CM

## 2019-06-23 DIAGNOSIS — G6181 Chronic inflammatory demyelinating polyneuritis: Secondary | ICD-10-CM

## 2019-06-23 DIAGNOSIS — E10649 Type 1 diabetes mellitus with hypoglycemia without coma: Secondary | ICD-10-CM | POA: Diagnosis not present

## 2019-06-23 LAB — POCT GLUCOSE (DEVICE FOR HOME USE): POC Glucose: 282 mg/dl — AB (ref 70–99)

## 2019-06-23 LAB — POCT GLYCOSYLATED HEMOGLOBIN (HGB A1C): Hemoglobin A1C: 10.3 % — AB (ref 4.0–5.6)

## 2019-06-23 MED ORDER — PROPRANOLOL HCL 10 MG PO TABS: 10.0000 mg | ORAL_TABLET | Freq: Two times a day (BID) | ORAL | 11 refills | Status: DC

## 2019-06-23 MED ORDER — GLUCAGON EMERGENCY 1 MG IJ KIT: PACK | INTRAMUSCULAR | 9 refills | Status: DC

## 2019-06-23 NOTE — Patient Instructions (Signed)
Follow up visit in two months. Call if having problems.

## 2019-06-23 NOTE — Progress Notes (Signed)
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  HISTORY OF PRESENT ILLNESS: The patient is a 29 year-old Caucasian young woman. She was unaccompanied today.   63. This 29 year-old Caucasian young woman with an already complicated medical history was first referred to me on 09/25/06 by her primary care provider, Dr. Garnet Koyanagi, for evaluation and management of type I diabetes mellitus, hypoglycemia, and a myriad of other autoimmune issues.   A. CIDP: At about age 76 the child began to limp.  At age 43 she was evaluated at [facility] and diagnosed with CIDP. At the time I first met her she was under the care of Dr. Marcene Brawn of [facility] in [place]. She was on a regular regimen of monthly intravenous treatments at home with IVIG and steroids on the days of therapy. She was also taking cyclosporine A daily, with alternating doses every other day. Because of the CIDP she had  significant problems with muscle atrophy and weakness, as well as multiple orthopedic issues. She was also being followed by Dr. Simonne Come, an orthopedist in [place]. On that first visit her legs and arms were quite weak. She found it very hard to walk or to exercise.  B. Type 1 diabetes mellitus: Her diabetes mellitus was also diagnosed at age 54, just prior to the diagnosis of CIDP. She had been started on a Medtronic Paradigm 712 insulin pump subsequently and was using Humalog lispro insulin in her pump. She had a very difficult time with blood glucose control. At the time of her steroids, her sugars tended to run very  high. However, when she was able to perform some physical activities, she tended to develop hypoglycemia. Her hemoglobin A1c on that first visit was 9.5%.  C. Celiac disease: The patient was diagnosed with celiac disease at approximately age 67. She had been on a gluten-free diet ever since.  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 She was followed both at [facility] and at the [facility] eye clinic.  2. Since that initial visit with me, I have followed her for the problems listed above plus other new problems that have developed over time.   A. CIDP: Her CIDP course has waxed and waned. At one point she had improved significantly, her medication doses had been reduced, she felt very much stronger, had more stamina, and was doing very well. Unfortunately, she later had a significant relapse of CIDP and lost much of the improvement that she had gained. Since going back to [facility] in the Spring of 2013, however, and starting a new and more intensified IVIG regimen, her strength had improved. She was still very weak physically and muscularly, however, and needed support to walk. As noted below, she gets stronger when her steroids are increased and weaker when the steroids are tapered. In the past  years Dr. Wyline Copas, MD, our senior pediatric neurologist, had assumed the role of managing her CIDP. More recently, however, she has been seeing Dr. Gustavus Bryant at [facility].  B. Type 1 diabetes mellitus: The patient's blood sugars have varied significantly with the course of her CIDP and the treatments  for that disease. Her hemoglobin A1c values have ranged from a low of 8.3% to a high of 14.0%. Most of her A1c values have been in the 8.4-10.1% range.   C. Celiac disease: The patient has really done quite well over the years. She rarely has had signs or symptoms of  abdominal problems. Because her antibody levels fluctuated a great deal, her celiac disease antibodies were negative in March 2012. She was mistakenly told that she did not have celiac disease and that she could resume a normal diet. As expected, resumption of a normal diet caused a recurrence of active celiac disease. In October 2012 she saw a gastroenterologist, Dr. Gale Journey, at [facility] who told her that she did have celiac disease. She is now back on her gluten-free diet almost all the time and is asymptomatic again.  D. Autoimmune thyroid disease: The patient had a goiter and a TPO antibody level of 287.2 on her first visit in May of 2008, consistent with evolving Hashimoto's Disease. She occasionally had the sensations of swelling and discomfort in her anterior neck, which were also c/w flare-ups of Hashimoto's Disease. In early 2009 she developed increased tachycardia. Lab tests performed in March 2010 by her primary care provider showed a TSH of 0.01 and a T4 of 7.7. It was initially felt that the increase in tachycardia was likely due to a combination of autonomic neuropathy and Hashitoxicosis.  However on follow-up laboratory tests performed in July, her TSH was 0.05, free T4 3.9, and free T3 14.2. Her TSI level done on the assay used at the time was 2.2, with normal being 1.0 or less. It was then evident that she had developed Graves' Disease in the setting of pre-existing Hashimoto's Disease.  I initiated treatment with methimazole, 10 mg twice daily and propranolol 20 mg 3 times daily. Since then we've seen significant swings of her thyroid function tests, partly due to changes in methimazole doses, but also partly due to changes in her immunosuppressant therapies. Her TSH values have ranged from a low of 0.01 to a high of 14.933. The dose of methimazole has been adjusted as needed to treat her Graves' disease. It has been my hope that her Hashimoto's disease will eventually cancel out her Graves' disease.    3.The patient's last [facility] visit was on 11/09/18. At that visit I continued her Lantus dose of 32 units and her current Novolog plan. I increased her MTZ dose to 10 mg, twice daily. She was supposed to repeat her TFTs in 4 weeks but did not do so. She was supposed to return to clinic in 3 months, but did not do so.   A. In the interim she has been generally healthy.  B. 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 She has since recovered.   C. Her husband was positive for covid-19 on 05/24/19. She was positive as well. She has since recovered.  D. She had been taking the MTZ doses of 10 mg, twice daily. However her BP has been lower in the past week, so she reduced the MTZ dose to 10 mg once a day. She continued the lisinopril dose of 2.5 mg twice daily. She has not been having any hyperthyroid symptoms for months. Her energy level is okay. She is not jittery or shaky.   E. 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   F. Her CIDP is about the same. She saw Dr. Edison Nasuti at the  [facility] in June 2018. No changes in her CDIP treatment plan were made. She remains on her regimen of IVIG and iv steroids. She continues to have IVIG every Monday and Thursday. She receives iv steroids on Monday. Her steroid dose has not been changed since her last visit. better. She uses a walker to help her walk from the parking lot to her job. Her headaches have not been as frequent. She converted to seeing Dr. Gustavus Bryant at the [facility] for her CIDP management. Her CIDP regimen is unchanged. She saw Dr Gaynell Face in December 2020 for her annual exam.    G. Her BGs have been higher and lower.   H. She is also still taking propranolol, vitamin D, iron, and a  vitamin B complex pill. She remains on her gluten-free diet.  I. She continues on her MDI regimen. She still takes 32 units of Lantus each evening. She takes Novolog according to our 150/50/5 plan. She is trying harder to be more consistent in her DM self-management.  J. She is not anxious now.   K. She lives with her husband. He works the night shift 5 days per week. He is off every other weekend and every other Wednesday and Thursday.   L. She started a lower carb diet recently.  4. Pertinent Review of Systems: Constitutional: Jolaine says she feels "pretty good" today. She has a good energy level. Her appetite is unchanged. She drinks about 12-24 ounces of caffeinated drinks per day. Her strength and stamina have been pretty good in the past months. However, she does not use her walker as much due to her fears about falling again.   Eyes: Her contact lenses are helping. Last eye exam was in July 2020. She did not have signs of DM.  Neck: The patient has not had any problems with anterior neck swelling and tenderness for more than a year.   Heart: Her heart rate has slowed down. She has no other complaints of palpitations, irregular heart beats, chest pain, or chest pressure.   Gastrointestinal:  Most of her GI problems resolved after she stopped eating pork products. She has not had any postprandial bloating recently. Bowel movents seem normal. The patient has no complaints of excessive hunger, acid reflux, diarrhea, or constipation.  Legs: Muscle mass and strength seem to be "about the same". There are few complaints of numbness, tingling, burning, or pain. No edema is noted.  Feet: There are no obvious new foot problems. There are no complaints of numbness, tingling, burning, or pain. No edema is noted. Neurologic: She still requires the use of her walker or wheel chair. Muscle weakness is still about the same. Sensation seems to be normal. Coordination is about the same. GYN: Her LMP was three  weeks ago. She has a new IUD now.  Scalp: She is no longer losing hair. Hypoglycemia: She has had a few low BGs.    5. Blood glucose review: We have data from the past two weeks. She checks BGs 3-6 times daily, average 3.8 times per day. Average BG was 251, compared with 274 at her last visit. Her BG range was 86->400, compared with 50-598 at her last visit. She often has a very late dinner when her husband comes home. She needs more  basal insulin overall.     PAST MEDICAL, FAMILY, AND SOCIAL HISTORY: 1. 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 She was married in May 2020. She will be changing her last name to [place] soon. She had questions today about having her own children vs adopting.  2. Activities: She has been walking more.  3. Smoking, alcohol, or drugs: None 4. Primary Care Provider: Dr. Garnet Koyanagi at [facility]. 5. Neurologist: Dr. Gustavus Bryant, [facility] every 6 months and Dr. Gaynell Face for annual visits.  REVIEW OF SYSTEMS: There are no other significant problems involving the patient's other body systems.  PHYSICAL EXAM: BP 124/82   Pulse 92   Ht 5' 3.5" (1.613 m) Comment: Patient stated. Cannot stand at height chart.  Wt 137 lb 6.4 oz (62.3 kg)   BMI 23.96 kg/m   Wt Readings from Last 3 Encounters:  06/23/19 137 lb 6.4 oz (62.3 kg)  05/11/19 135 lb 12.8 oz (61.6 kg)  01/30/19 130 lb (59 kg)    Ht Readings from Last 3 Encounters:  06/23/19 5' 3.5" (1.613 m)  05/11/19 5' 3.5" (1.613 m)  01/30/19 5' 4"  (1.626 m)    HC Readings from Last 3 Encounters:  No data found for [facility]   Facility age limit for growth percentiles is 20 years.  Body mass index is 23.96 kg/m. Facility age limit for growth percentiles is 20 years.  Body surface area is 1.67 meters squared.  Constitutional: This patient is in her wheelchair today. She appears healthy and looks pretty good. She has gained 7 pounds. Her affect and insight are quite good  today.  Head: The head is normocephalic. Face: The face appears normal. There are no obvious dysmorphic features. Eyes: The eyes appear to be normally formed and spaced. Gaze is conjugate. There is no obvious arcus or proptosis. Moisture appears normal. Mouth: The oropharynx and tongue appear normal. Dentition appears to be normal for age. Oral moisture is normal. She has no tongue tremor.  Neck: On inspection the thyroid gland is again visibly enlarged. The gland is slightly smaller at about 21 grams in size. Today the lobes are symmetrically enlarged, but the isthmus is not enlarged. The thyroid gland is softer today and is not tender to palpation. Lungs: The lungs are clear to auscultation. Air movement is good. Heart: Heart rate is very fast. Heart rhythm is regular. Heart sounds S1 and S2 are normal. I do not hear her previous grade 2/6 SEM flow murmur due to her Graves' disease.   Abdomen: The abdomen is larger in size today. Bowel sounds are normal. There is no obvious hepatomegaly, splenomegaly, or other mass effect.  Arms: Muscle size and bulk are low-normal for age. Hands: There is no tremor today. Phalangeal and metacarpophalangeal joints are normal. Palmar muscles are low-normal for age. Palmar skin shows no palmar erythema. Palmar moisture is normal.. Legs: Muscles appear small for age, especially in the right leg. No edema is present. Feet: Dorsalis pedal pulses are faint 1+ on the right and 1+ on the left.  Neurologic: Strength is 4-5/5 for age in the upper extremities, 2/5 in the right leg, and 4/5 in the left leg. Muscle tone is somewhat low. Sensation to touch is normal in both her legs and feet.   Skin: No rashes Scalp: Overall the scalp looks pretty good.   LAB DATA:  Labs 06/23/19: HbA1c 10.3%, CBG 282  Labs 11/05/18: CBG: 192; TSH 0.03, free T4  1.6, free T3 3.9, TSI <89; CMP normal except glucose of 341, sodium 131, albumin 3.2 (ref 3.6-5.1), globulin 7.2 (ref 1.9-3.7);  cholesterol 157, triglycerides 62, HDL 52, LDL 91; urinary microalbumin/creatinine ratio 40  Labs 08/19/18: HbA1c 10.3%, CBG 174  Labs 04/27/18: HbA1c 9.1%, CBG 270; TSH 0.11, free T4 1.2, free T3 3.2; CMP normal, except glucose 264, sodium 131, albumin 3.3 (ref 3.6-5.1), globulin 8.2 (ref 1.9-3.7); CBC normal, except lymphocytes 257 (ref [id number])  Labs 02/23/18: CBG 264; TSH 0.35, free T4 1.1, free T3 3.7; CMP normal except glucose of 153, total protein 11.4 (ref 6.1-8.1), albumin 3.2 (ref 3.6-5.1), globulin 8.2 (ref 1.9-3.7); amylase 42 (ref 21-101)  Labs 01/09/18: HbA1c 10.2%  Labs 08/27/17: CBG 220  Labs 06/25/17: HbA1c 9.6%, CBG 92; TSH 1.92, free T4 1.1, free T3 3.1, TSI <89; urine microalbumin/creatinine ratio 53 (ref <30); CBC normal; CMP normal, except for glucose 224, sodium 132, albumin 3.2 (ref 3.6-5.1), calcium 8.5 (ref 8.6-10.2, corrected to 9.2 for albumin level), globulin 6.1 (ref 1.9-3.7)  Labs 01/09/17: CBG 293  Labs 12/20/16: ACTH stimulation test: Cortisols 3, 18.9, 24.5; sodium 130, albumin 2.8  Labs 12/17/16: HbA1c 10.1%; TSH 1.14, free T4 1.2, free T3 2.6, TSI <89; WBC 15/8, Hgb 13.1, Hct 40/8%  Labs 07/11/16: HbA1c 11.5%, CBG 225  Labs 06/12/16: TSH 2.20, free T4 1.02, free T3 3.0; CBC normal; CMP normal except sodium 133, glucose 259, albumin 2.8, calcium 8.3  Labs 02/22/16: HbA1c 10.7%  Labs 10/18/15: HbA1c 10.8%  Labs 05/09/15: HbA1c 11.1%  Labs 04/07/15: CBC normal; TSH 1.826, free T4 1.06, free T3 2.8, TSI 156; iron 234, CMP normal except for sodium 132, glucose 159, albumin 2.9, calcium 8.7; ACTH 11, cortisol 10.5  Labs 11/08/14: HbA1c 10.9%; TSH 2.234, free T4 0.87, free T3 2.7, TSI pending  Labs 06/01/14: HbA1c is 9.7% today, compared with 11.5% at last visit. Other labs are pending.  Labs 02/17/14: TSH 2.125, free T4 0.91, free T3 3.0  Labs 07/09/13; HbA1c was 11.4%, compared with 9.1% at last visit and with 14.0% at the visit prior. TSH was 0.652, free T4  0.88, free T3 3.3  Labs 10/06/12: TSH 0.088, free T4 1.28, free T3 3.1  Labs 02/25/12: TSH 1.044, free T4 1.45, free T3 3.4  Labs 06/14/11: TSH was 1.633. Free T4 1.47. Free T3 was 3.1. TSI was 56 (normal less than 120).                       ASSESSMENT: 1. Type 1 diabetes mellitus:   A. Her BG control has remained about the same overall, but is perhaps somewhat better in the past two weeks. She is still trying to eat with her husband on the two night each week that they can have dinner together. She is much more consistent about checking BGs and giving injections than she was.    B. She really needs the T-Slim pump and Dexcom G6 sensor and is now interested in obtaining them.  2.  Hypoglycemia: She had not had any documented low BGs recently, but has had symptoms. She feels the low BGs coming on if they drop rapidly, but does not always feel the low BGs that occur slowly and progressively until she is quite low.  3. Autonomic neuropathy with tachycardia and gastroparesis: Her heart rate is lower, hopefully because her Graves' disease is under better control.   4-8 Autoimmune thyroid disease, Graves' disease, Hashimoto's disease, and goiter:   A. Thyrotoxicosis: Her  diffuse thyrotoxicosis, secondary to Graves' disease, has waxed and waned over time.  Her thyrotoxicosis was well controlled by her current methimazole doses in January 2018 and again in February 2019. In October 2019, however, she was again hyperthyroid, so I asked her to increase the methimazole to 15 mg, twice daily. She heard 15 mg daily, so she had not had adequate treatment for her Berenice Primas' disease for several months. Ather oast visit I asked her to take 10 mg of omeprazole twice daily. She did so until recently, but then halved the dose as noted above.  B. Hashimoto's thyroiditis: The patient's thyroiditis is clinically quiescent at this time. However, the pattern in which all 3 of her TFTs decreased in parallel together from 2013 to  2014 indicated that she had had a relatively recent flare up of Hashimoto's Dz. I expect that as her Hashimoto's disease progressively destroys more thyroid cells, we will be able to taper her methimazole to zero. She will probably become permanently hypothyroid over time and will require Synthroid hormone replacement.  C. Goiter: Thyroid gland is a bit smaller today, c/w an improvement of Graves Dz. The waxing and waning of thyroid lobe size is c/w both with evolving Hashimoto's disease and fluctuating Graves' Dz activity.  9. Celiac disease: Kalinda is following her gluten-free diet and is doing well.  10.  Chronic inflammatory demyelinating polyneuropathy (CIDP) and muscle weakness: Her left leg is stronger, but her right leg remains very weak.    11-12. Fatigue and decreased stamina: These problems are better. Since she had been on a regimen of 1-2 iv glucocorticoid treatment per week for many months, it was possible that her adrenals were suppressed. However, her last ACTH and cortisol levels in November 2016 were normal. Her ACTH stimulation test results in July 2018 were also quite normal.  13. Emotionality: Her anxiety has essentially resolved. If the anxiety worsens, I suggested a referral to Ms. Rea College, MSN.  14-15. Dyspepsia-GERD: She is doing fairly well overall. 16. Hypertension: Her SBP is good today, but her DBP is still elevated. Given Orelia's muscular weakness and tendency to be physically unbalanced, it is not appropriate to increase her BP medication too much, otherwise she will have more falls.  17.  Adjustment reaction to illness and medical therapy: I continue to be impressed and inspired by Madaline's dignity and courage as she tries to do all that she can to improve her own health.  PLAN: 1. Diagnostic: HbA1c and CBG today. Repeat TFTs. CMP, microalbumin/creatinine ratio.   2. Therapeutic:   A. Increase the Lantus dose to 34 units as tolerated. Continue the current  Novolog plan. Check BGs at meals and at bedtime every day.   B. Continue current doses of her endocrine meds. We will adjust her MTZ dose after seeing her lab results.    C. We will arrange an appointment with Dr. Drexel Iha to start the paperwork for a T-slim pump and Dexcom G6.  3. Patient education: We discussed all of the above at great length.  4. Follow-up: 2 months in a new patient slot.   Level of Service: This visit lasted in excess of 65 minutes. More than 50% of the visit was devoted to counseling  Tillman Sers, MD, CDE Adult and Pediatric Endocrinology

## 2019-06-24 LAB — COMPREHENSIVE METABOLIC PANEL
AG Ratio: 0.6 (calc) — ABNORMAL LOW (ref 1.0–2.5)
ALT: 13 U/L (ref 6–29)
AST: 20 U/L (ref 10–30)
Albumin: 3.4 g/dL — ABNORMAL LOW (ref 3.6–5.1)
Alkaline phosphatase (APISO): 61 U/L (ref 31–125)
BUN: 15 mg/dL (ref 7–25)
CO2: 26 mmol/L (ref 20–32)
Calcium: 8.3 mg/dL — ABNORMAL LOW (ref 8.6–10.2)
Chloride: 98 mmol/L (ref 98–110)
Creat: 0.56 mg/dL (ref 0.50–1.10)
Globulin: 5.8 g/dL (calc) — ABNORMAL HIGH (ref 1.9–3.7)
Glucose, Bld: 296 mg/dL — ABNORMAL HIGH (ref 65–99)
Potassium: 3.7 mmol/L (ref 3.5–5.3)
Sodium: 131 mmol/L — ABNORMAL LOW (ref 135–146)
Total Bilirubin: 0.3 mg/dL (ref 0.2–1.2)
Total Protein: 9.2 g/dL — ABNORMAL HIGH (ref 6.1–8.1)

## 2019-06-24 LAB — CBC WITH DIFFERENTIAL/PLATELET
Absolute Monocytes: 513 cells/uL (ref 200–950)
Basophils Absolute: 49 cells/uL (ref 0–200)
Basophils Relative: 0.9 %
Eosinophils Absolute: 81 cells/uL (ref 15–500)
Eosinophils Relative: 1.5 %
HCT: 42.7 % (ref 35.0–45.0)
Hemoglobin: 13.7 g/dL (ref 11.7–15.5)
Lymphs Abs: 1528 cells/uL (ref 850–3900)
MCH: 29 pg (ref 27.0–33.0)
MCHC: 32.1 g/dL (ref 32.0–36.0)
MCV: 90.3 fL (ref 80.0–100.0)
MPV: 12.3 fL (ref 7.5–12.5)
Monocytes Relative: 9.5 %
Neutro Abs: 3229 cells/uL (ref 1500–7800)
Neutrophils Relative %: 59.8 %
Platelets: 181 10*3/uL (ref 140–400)
RBC: 4.73 10*6/uL (ref 3.80–5.10)
RDW: 12.2 % (ref 11.0–15.0)
Total Lymphocyte: 28.3 %
WBC: 5.4 10*3/uL (ref 3.8–10.8)

## 2019-06-24 LAB — MICROALBUMIN / CREATININE URINE RATIO
Creatinine, Urine: 50 mg/dL (ref 20–275)
Microalb Creat Ratio: 22 mcg/mg creat (ref <30)
Microalb, Ur: 1.1 mg/dL

## 2019-06-24 LAB — T3, FREE: T3, Free: 3.9 pg/mL (ref 2.3–4.2)

## 2019-06-24 LAB — TSH: TSH: 1.97 mIU/L

## 2019-06-24 LAB — T4, FREE: Free T4: 1.1 ng/dL (ref 0.8–1.8)

## 2019-07-13 ENCOUNTER — Other Ambulatory Visit (INDEPENDENT_AMBULATORY_CARE_PROVIDER_SITE_OTHER): Payer: Self-pay | Admitting: "Endocrinology

## 2019-07-13 ENCOUNTER — Encounter (INDEPENDENT_AMBULATORY_CARE_PROVIDER_SITE_OTHER): Payer: Self-pay | Admitting: *Deleted

## 2019-07-14 ENCOUNTER — Other Ambulatory Visit (INDEPENDENT_AMBULATORY_CARE_PROVIDER_SITE_OTHER): Payer: Self-pay | Admitting: "Endocrinology

## 2019-08-12 ENCOUNTER — Telehealth (INDEPENDENT_AMBULATORY_CARE_PROVIDER_SITE_OTHER): Payer: Self-pay | Admitting: "Endocrinology

## 2019-08-12 NOTE — Telephone Encounter (Signed)
  Who's calling (name and relationship to patient) :Jessica Kelley Best contact [phone]  Provider they see:Dr. Gaynell Face  Reason for call:Needs her pre medication orders to say PRN to take before her IBIG treatment  if possible. They are faxing the order over to be filled out if orders are ok.      PRESCRIPTION REFILL ONLY  Name of prescription:  Pharmacy:

## 2019-08-18 NOTE — Telephone Encounter (Signed)
The forms have been placed on Dr. Melanee Left desk for his signature

## 2019-08-25 ENCOUNTER — Ambulatory Visit (INDEPENDENT_AMBULATORY_CARE_PROVIDER_SITE_OTHER): Payer: BC Managed Care – PPO | Admitting: "Endocrinology

## 2019-11-10 ENCOUNTER — Ambulatory Visit (INDEPENDENT_AMBULATORY_CARE_PROVIDER_SITE_OTHER): Payer: BC Managed Care – PPO | Admitting: "Endocrinology

## 2019-12-14 ENCOUNTER — Telehealth (INDEPENDENT_AMBULATORY_CARE_PROVIDER_SITE_OTHER): Payer: Self-pay | Admitting: Pediatrics

## 2019-12-14 NOTE — Telephone Encounter (Signed)
Who's calling (name and relationship to patient) : Quintella Reichert  Best contact number: [phone]  Provider they see: Dr. Gaynell Face  Reason for call: Larena Glassman had questions about the rx immune globulin  The date on the rx says 12/12/29 instead of 21 for the year. Larena Glassman would like this corrected. When corrected the provider needs to put hand written initials and date when they make that correction  Call ID:      PRESCRIPTION REFILL ONLY  Name of prescription:  Pharmacy:

## 2019-12-14 NOTE — Telephone Encounter (Signed)
Done - thank you.

## 2019-12-14 NOTE — Telephone Encounter (Signed)
Form has been placed back on your desk

## 2020-01-05 ENCOUNTER — Other Ambulatory Visit: Payer: Self-pay

## 2020-01-05 ENCOUNTER — Ambulatory Visit (INDEPENDENT_AMBULATORY_CARE_PROVIDER_SITE_OTHER): Payer: BC Managed Care – PPO | Admitting: Pediatrics

## 2020-01-05 ENCOUNTER — Encounter (INDEPENDENT_AMBULATORY_CARE_PROVIDER_SITE_OTHER): Payer: Self-pay | Admitting: Pediatrics

## 2020-01-05 VITALS — BP 118/90 | HR 92 | Ht 63.5 in | Wt 138.0 lb

## 2020-01-05 DIAGNOSIS — G6181 Chronic inflammatory demyelinating polyneuritis: Secondary | ICD-10-CM | POA: Diagnosis not present

## 2020-01-05 NOTE — Progress Notes (Signed)
Patient: Jessica Kelley MRN: [id number] Sex: female DOB: 1990/08/10  Provider: Wyline Copas, MD Location of Care: [facility]  Note type: Routine return visit  History of Present Illness: Referral Source: Garnet Koyanagi DO History from: mother, patient and [facility] chart Chief Complaint: Chronic inflammatory demyelinating polyneuritis  Jessica Kelley is a 29 y.o. female who returns January 05, 2020 for the first time since May 11, 2019.  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  She is followed at [facility] for a number of years and responded to immunotherapy with IVIG and Solu-Medrol which stabilized her strength.  She has also been on other immunosuppressant medications.  She is also been seen by Dr. Gustavus Bryant at [facility].  A copy of his interaction will be placed in past medical history.  She suffered a closed head injury to January 31, 2019 causing a left occipital skull fracture and subdural hematoma.  This is also reported in past medical history.  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  She had iridectomy and surgery for glaucoma.  She last saw an ophthalmologist a few weeks ago.  There is been no significant change in her vision.  Not possible to correct vision in that eye.  She has type 1 diabetes mellitus, Hashimoto's thyroiditis, celiac disease, and neuromuscular scoliosis.  She is followed by Dr. Tillman Sers for her endocrine disorders.  Despite all this she has been medically stable.  There is been no deterioration in her strength.  Rather than use a walker to get around, she uses a wheelchair both at work and at home.  The wheelchair that she had in the office today is quite worn.  It is a portable chair but I cannot believe that it is a comfortable means of transportation.  She told me  that she is planning on getting a new chair.  I included this information in the chart because I believe that is medically necessary for her to have a chair that properly fits and supports her.  This chair needs to be portable so that she can take it in her car and use it at work.  It needs to be properly fit to her size.  She is able to transport herself because of the strength in her arms.  This primarily would be used in the workplace and also when she is out in public.  Her general health is good.  Contracted a mild case of Covid along with her husband in January 2021.  He works in a prison and likely was the source of the infection for the 2 of them.  Fortunately she only had upper respiratory symptoms for 3 days but she was quarantined for 2 weeks.  Because of her underlying immune disorder, and the fact that she is receiving IVIG and prednisone, immunizing her with the ineffective her neurologist at [facility] strongly recommended that she not receive the vaccine.  She works as a Freight forwarder at [employer] in [place].    Review of Systems: A complete review of systems was remarkable for patient is here to be seen for a follow up. she reports that things are still the same. She reports no concerns at this time., all other systems reviewed and negative.  Past Medical History Diagnosis Date   Brain bleed [facility])    Patient stated   Celiac disease  CIDP (chronic inflammatory demyelinating polyneuropathy) ([place]) 2001   Diabetes type I ([place])    Dr. Tobe Sos   Glaucoma    Hypothyroidism    Macular degeneration    Pilonidal cyst    Scoliosis    Wyburn-Mason syndrome    Hospitalizations: No., Head Injury: No., Nervous System Infections: No., Immunizations up to date: Yes.    Detailed information from previous evaluations at [facility] are in the media section and from [facility] in [place].  CT scan of the brain May 03, 2012 was negative  MRI of  the pelvis at [facility] October 25, 2014 showed stable thickening and increased T2 hyperintensity within the exiting nerve roots and lumbosacral plexus and sciatic nerves. Thickening and increased T2 intensity of the left lateral femoral cutaneous and intrapelvic femoral nerves was less conspicuous.  Dr. Gustavus Bryant, [facility] neurology has seen her.  There was an initial consultation for this condition on March 31, 2018.  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  Despite this Dr. 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   She had a closed head injury January 31, 2019 when she lost balance and fell striking her head causing a left occipital skull fracture and subdural hematoma.  MRI scan February 07, 2019 showed a 7 mm right cerebral convexity hematoma that had increased from 4 to 5 mm.  She also had some parenchymal hematomas in the right greater than left gyri recti with surrounding vasogenic edema, extra-axial include collections in the floor of the anterior cranial fossa anterior aspect of the right temporal lobe and posterior margin of the clivus, and left sphenoid sinus.  She was out of work for 2 months.  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  Birth History 7 pound 9 ounce infant born at full-term.  Gestation was uncomplicated.  Labor lasted 23 hours.  Operative delivery with forceps.  Nursery course was uneventful.  Growth and development is recalled as normal.  Behavior History none  Surgical History Procedure Laterality Date   CATARACT EXTRACTION  06/25/06   FOOT ARTHROPLASTY     left foot moved and stretched tendons    GLAUCOMA SURGERY  02/12/06   HIP ARTHROPLASTY     right  never bipopsy   PORTACATH PLACEMENT  2002-2004   removal of vascular port     Sciatic Nerve Muscle Biopsy     Family History family history includes Cancer in her maternal grandfather and paternal grandmother; Coronary artery disease in her paternal grandfather; Diabetes in her sister; Hyperlipidemia in an other family member; Other in her sister; Prostate cancer in her maternal grandfather; Thyroid disease in her mother. Family history is negative for migraines, seizures, intellectual disabilities, blindness, deafness, birth defects, chromosomal disorder, or autism.  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    She lives with her husband and has one sister, 31 yo.    She enjoys working, camping, and walking outside.   Allergies Allergen Reactions   Gluten Meal    Wheat Bran    Physical Exam BP 118/90    Pulse 92    Ht 5' 3.5" (1.613 m)    Wt 138 lb (62.6 kg)    BMI 24.06 kg/m   General: alert, well developed, well nourished, in no acute distress, dyed red/sandy hair, blue eyes, left handed Head: normocephalic, no dysmorphic features Ears, Nose and Throat: Otoscopic: tympanic membranes normal; pharynx: oropharynx is pink without exudates or tonsillar hypertrophy Neck: supple, full range of motion, no cranial or cervical bruits Respiratory: auscultation  clear Cardiovascular: no murmurs, pulses are normal Musculoskeletal: no skeletal deformities or apparent scoliosis Skin: no rashes or neurocutaneous lesions  Neurologic Exam  Mental Status: alert; oriented to person, place and year; knowledge is normal for age; language is normal Cranial Nerves: visual fields are full to double simultaneous stimuli; extraocular movements are full and conjugate; pupils are round reactive to light; funduscopic examination shows sharp disc margins with normal vessels; symmetric facial strength; midline tongue and uvula; air conduction is greater than bone conduction bilaterally Motor: Sternocleidomastoid, trapezius, neck flexors and extensors: 5/5; deltoid: Right 4+, left 4, biceps: Right 5, left 4+, triceps: Right 5, left 4+; supraspinatus right: 4, left: 4 -; pronator teres 4+; supinator: 4, pectoralis: Right 5, left 4+; wrist extensors and flexors 5; opponens: Right 4, left 4+; distal digital flexors: Right 4+, left 5  Hip abductors: Right 2, left 4, hip abductors: Right 1, left 5; knee flexors: Right 2, left 4+, knee extensors: Right 2, left 5; foot dorsiflexors: Right 0, left 4; foot plantar flexors: Right 0, left 5, toe movement: Right 0, left 3  Sensory: intact responses to cold, vibration, proprioception all 4 extremities, and stereognosis Coordination: good finger-to-nose, rapid repetitive alternating movements and finger apposition Gait and Station: not tested Reflexes: absent bilaterally; no clonus; bilateral neutral plantar responses  Assessment 1.  Demyelinating polyneuritis, chronic inflammatory, G61.81.  Discussion Leeya is medically stable despite multiple medical problems.  There is no reason to change her treatment.  Plan I informed her of my plan to retire in September 2022.  I know that Dr. Tobe Sos is moving closer to retirement as well.  It is important that we find physicians who can continue to provide care for her that takes into  account her very complex medical and endocrinologic condition.  She will return to see me in 6 months I will see her sooner based on clinical need.  Greater than 50% of a 40-minute visit was spent in counseling and coordination of care concerning her underlying neurologic disorder, discussing the need for a wheelchair, briefly discussing her endocrine issues, her congenital retinal disorder, and discussing Covid and the vaccine.   Medication List   Accurate as of January 05, 2020  8:30 AM. If you have any questions, ask your nurse or doctor.    Contour Next Monitor w/Device Kit 1 Units by Does not apply route daily.   Glucagon Emergency 1 MG Kit Use  in case of severe low glucose   glucose blood test strip Commonly known as: Contour Next Test Use to check glucose 6x daily   Immune Globulin (Human) 5 GM/50ML Soln Inject into the vein.   Insulin Pen Needle 32G X 4 MM Misc Commonly known as: Insupen Pen Needles Inject insulin via insulin pen 7 x daily   Lantus SoloStar 100 UNIT/ML Solostar Pen Generic drug: insulin glargine INJECT UP TO 50 UNITS SUBCUTANEOUSLY ONCE DAILY   lisinopril 2.5 MG tablet Commonly known as: ZESTRIL Take 1 tablet by mouth once daily   methimazole 5 MG tablet Commonly known as: TAPAZOLE Take 3 tablets, twice daily.   methylPREDNISolone sodium succinate 500 MG injection Commonly known as: SOLU-MEDROL Give 57m IV in 50 ml NS over 1 hour x 1 every 4 weeks. Given by [facility]   multivitamin with minerals tablet Take by mouth.   NovoLOG FlexPen 100 UNIT/ML FlexPen Generic drug: insulin aspart Inject up to 50 units daily   propranolol 20 MG tablet Commonly known as: INDERAL TAKE 1 TABLET BY MOUTH THREE TIMES DAILY   propranolol 10 MG tablet Commonly known as: INDERAL Take 1 tablet (10 mg total) by mouth 2 (two) times daily with a meal.   VITAMIN B-12 PO Take by mouth.   Vitamin D 1000 units capsule Take 1,000 Units by mouth daily.     The medication list was reviewed and reconciled. All changes or newly prescribed medications were explained.  A complete medication list was provided to the patient/caregiver.  WJodi GeraldsMD

## 2020-01-05 NOTE — Patient Instructions (Addendum)
Thank you for coming today.  I am glad that things are stable.  We discussed at length the need to figure out who is going to see you both for neurology and endocrinology once I retire and Dr. Marcille Blanco.  You need to be an active participant in those discussions so that we can find very best fit for you.  I would like to see you in 6 months.  Please let me know if I need to see you sooner.

## 2020-01-17 ENCOUNTER — Encounter (INDEPENDENT_AMBULATORY_CARE_PROVIDER_SITE_OTHER): Payer: Self-pay | Admitting: "Endocrinology

## 2020-01-17 ENCOUNTER — Ambulatory Visit (INDEPENDENT_AMBULATORY_CARE_PROVIDER_SITE_OTHER): Payer: BC Managed Care – PPO | Admitting: "Endocrinology

## 2020-01-17 ENCOUNTER — Other Ambulatory Visit: Payer: Self-pay

## 2020-01-17 VITALS — BP 124/82 | HR 88 | Wt 140.2 lb

## 2020-01-17 DIAGNOSIS — E10649 Type 1 diabetes mellitus with hypoglycemia without coma: Secondary | ICD-10-CM

## 2020-01-17 DIAGNOSIS — E063 Autoimmune thyroiditis: Secondary | ICD-10-CM

## 2020-01-17 DIAGNOSIS — I10 Essential (primary) hypertension: Secondary | ICD-10-CM

## 2020-01-17 DIAGNOSIS — E1043 Type 1 diabetes mellitus with diabetic autonomic (poly)neuropathy: Secondary | ICD-10-CM

## 2020-01-17 DIAGNOSIS — R1013 Epigastric pain: Secondary | ICD-10-CM

## 2020-01-17 DIAGNOSIS — K900 Celiac disease: Secondary | ICD-10-CM

## 2020-01-17 DIAGNOSIS — E1065 Type 1 diabetes mellitus with hyperglycemia: Secondary | ICD-10-CM | POA: Diagnosis not present

## 2020-01-17 DIAGNOSIS — E0500 Thyrotoxicosis with diffuse goiter without thyrotoxic crisis or storm: Secondary | ICD-10-CM

## 2020-01-17 DIAGNOSIS — G6181 Chronic inflammatory demyelinating polyneuritis: Secondary | ICD-10-CM

## 2020-01-17 LAB — POCT GLYCOSYLATED HEMOGLOBIN (HGB A1C): Hemoglobin A1C: 9.1 % — AB (ref 4.0–5.6)

## 2020-01-17 LAB — POCT GLUCOSE (DEVICE FOR HOME USE): POC Glucose: 245 mg/dl — AB (ref 70–99)

## 2020-01-17 MED ORDER — PROPRANOLOL HCL 10 MG PO TABS
ORAL_TABLET | ORAL | 11 refills | Status: DC
Start: 2020-01-17 — End: 2021-12-07

## 2020-01-17 NOTE — Progress Notes (Signed)
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  HISTORY OF PRESENT ILLNESS: The patient is a 29 year-old Caucasian young woman. She was unaccompanied today.   53. This 29 year-old Caucasian young woman with an already complicated medical history was first referred to me on 09/25/06 by her primary care provider, Dr. Garnet Koyanagi, for evaluation and management of type I diabetes mellitus, hypoglycemia, and a myriad of other autoimmune issues.   A. CIDP: At about age 53 the child began to limp.  At age 57 she was evaluated at [facility] and diagnosed with CIDP. At the time I first met her she was under the care of Dr. Marcene Brawn of [facility] in [place]. She was on a regular regimen of monthly intravenous treatments at home with IVIG and steroids on the days of therapy. She was also taking cyclosporine A daily, with alternating doses every other day. Because of the CIDP she had  significant problems with muscle atrophy and weakness, as well as multiple orthopedic issues. She was also being followed by Dr. Simonne Come, an orthopedist in [place]. On that first visit her legs and arms were quite weak. She found it very hard to walk or to exercise.  B. Type 1 diabetes mellitus: Her diabetes mellitus was also diagnosed at age 17, just prior to the diagnosis of CIDP. She had been started on a Medtronic Paradigm 712 insulin pump subsequently and was using Humalog lispro insulin in her pump. She had a very difficult time with blood glucose control. At the time of her steroids, her sugars tended to run very  high. However, when she was able to perform some physical activities, she tended to develop hypoglycemia. Her hemoglobin A1c on that first visit was 9.5%.  C. Celiac disease: The patient was diagnosed with celiac disease at approximately age 73. She had been on a gluten-free diet ever since.  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 She was followed both at [facility] and at the [facility] eye clinic.  2. Since that initial visit with me, I have followed her for the problems listed above plus other new problems that have developed over time.   A. CIDP: Her CIDP course has waxed and waned. At one point she had improved significantly, her medication doses had been reduced, she felt very much stronger, had more stamina, and was doing very well. Unfortunately, she later had a significant relapse of CIDP and lost much of the improvement that she had gained. Since going back to [facility] in the Spring of 2013, however, and starting a new and more intensified IVIG regimen, her strength had improved. She was still very weak physically and muscularly, however, and needed support to walk. As noted below, she gets stronger when her steroids are increased and weaker when the steroids are tapered. In the past  years Dr. Wyline Copas, MD, our senior pediatric neurologist, had assumed the role of managing her CIDP. More recently, however, she has been seeing Dr. Gustavus Bryant at [facility].  B. Type 1 diabetes mellitus: The patient's blood sugars have varied significantly with the course of her CIDP and the treatments  for that disease. Her hemoglobin A1c values have ranged from a low of 8.3% to a high of 14.0%. Most of her A1c values have been in the 8.4-10.1% range.   C. Celiac disease: The patient has really done quite well over the years. She rarely has had signs or symptoms of  abdominal problems. Because her antibody levels fluctuated a great deal, her celiac disease antibodies were negative in March 2012. She was mistakenly told that she did not have celiac disease and that she could resume a normal diet. As expected, resumption of a normal diet caused a recurrence of active celiac disease. In October 2012 she saw a gastroenterologist, Dr. Gale Journey, at [facility] who told her that she did have celiac disease. She is now back on her gluten-free diet almost all the time and is asymptomatic again.  D. Autoimmune thyroid disease: The patient had a goiter and a TPO antibody level of 287.2 on her first visit in May of 2008, consistent with evolving Hashimoto's Disease. She occasionally had the sensations of swelling and discomfort in her anterior neck, which were also c/w flare-ups of Hashimoto's Disease. In early 2009 she developed increased tachycardia. Lab tests performed in March 2010 by her primary care provider showed a TSH of 0.01 and a T4 of 7.7. It was initially felt that the increase in tachycardia was likely due to a combination of autonomic neuropathy and Hashitoxicosis.  However on follow-up laboratory tests performed in July, her TSH was 0.05, free T4 3.9, and free T3 14.2. Her TSI level done on the assay used at the time was 2.2, with normal being 1.0 or less. It was then evident that she had developed Graves' Disease in the setting of pre-existing Hashimoto's Disease.  I initiated treatment with methimazole, 10 mg twice daily and propranolol 20 mg 3 times daily. Since then we've seen significant swings of her thyroid function tests, partly due to changes in methimazole doses, but also partly due to changes in her immunosuppressant therapies. Her TSH values have ranged from a low of 0.01 to a high of 14.933. The dose of methimazole has been adjusted as needed to treat her Graves' disease. It has been my hope that her Hashimoto's disease will eventually cancel out her Graves' disease.    E. 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 She has since recovered fairly well, but her sense of smell is still absent and her new OCD tendencies persist.   F. Her husband was positive for covid-19 on 05/24/19. She was positive as well. She has since recovered.  3.The patient's last [facility] visit was on 06/23/19. At that visit I increased her Lantus dose to 34 units and continued her current Novolog plan and her MTZ dose of 10 mg, once daily. She was supposed to return to clinic in 2 months, but did not do so. She cancelled appointments on 08/25/19 and 11/10/19.  A. In the interim she has been generally healthy.  B. She has been taking the Lantus dose of 34 units and following her Novolog plan. She has been taking the MTZ doses of 10 mg, once daily.   C. Her BP has been stable. She continues the lisinopril dose of 2.5 mg twice daily.  D. She has not been having any hyperthyroid symptoms or hypothyroid symptoms for months. Her energy level is "good'. She is not jittery or shaky. She is sleeping well.  E. She continues to have frequent and annoying OCD tendencies since her  brain injury, for example, checking the door locks 11 times per day, making sure the stove is off multiple times, taking the dog out every hour, and making sure all the electrical appliances are plugged in correctly.   F. Her CIDP is about the same. She saw Dr. Edison Nasuti at the  [facility] in June 2018. No changes in her CDIP treatment plan were made. She remains on her regimen of IVIG and iv steroids. She continues to have IVIG every Monday and Thursday. She receives iv steroids on Monday. Her steroid dose has not been changed since her last visit. better. She uses a walker to help her walk from the parking lot to her job. Her headaches have not recurred for several months. She converted to seeing Dr. Gustavus Bryant at the [facility] for her CIDP management. Her CIDP regimen is unchanged. She saw Dr Gaynell Face in  August 2021 for her annual exam.    G. Her BGs have continued to be variable, with some spikes and some lows. She had a prolonged period of low BG when at the beach earlier this month. She had been swimming and was out in the sun for several hours.  H. She is also still taking 5 mg propranolol, vitamin D, iron, and a vitamin B complex pill. She remains on her gluten-free diet.  I. She continues on her MDI regimen. She still takes 34 units of Lantus each evening. She takes Novolog according to our 150/50/5 plan. She is trying harder to be more consistent in her DM self-management.  J. She is not anxious now.   K. She lives with her husband. He works the night shift 5 days per week. He is off every other weekend and every other Wednesday and Thursday.   L. She remains on a lower carb diet.  4. Pertinent Review of Systems: Constitutional: Lynesha says she feels "pretty good" today. She has a good energy level. Her appetite is unchanged. She drinks about 12-24 ounces of caffeinated drinks per day. Her strength and stamina have been pretty good in the past months. However, she does not use her walker as much due to her fears about falling again.   Eyes: Her contact lenses are helping. Last eye exam was in July 2021. She did not have signs of DM.  Neck: The patient has not had any problems with anterior neck swelling and tenderness for more than a year.   Heart: Her heart rate has been normal. She has no complaints of palpitations, irregular heart beats, chest pain, or chest pressure.   Gastrointestinal:  Most of her GI problems resolved after she stopped eating pork products. She has had some postprandial bloating at times. Bowel movents seem normal. The patient has no complaints of excessive hunger, acid reflux, diarrhea, or constipation.  Legs: Muscle mass and strength seem to be "about the same". There are few complaints of numbness, tingling, burning, or pain. No edema is noted.  Feet: There are no  obvious new foot problems. There are no complaints of numbness, tingling, burning, or pain. No edema is noted. Neurologic: She still requires the use of her walker or wheel chair. Muscle weakness is still about the same. Sensation seems to be normal. Coordination is about the same. GYN: Her LMP was several months ago. She has an IUD.  Scalp: She is no longer losing hair. Hypoglycemia: She has had a few some low BGs.    5. Blood glucose review: We have data from the past two  weeks. She checks BGs 3-5 times daily, average 4.8 times per day. Average BG was 255, compared with 251 at her last visit. Her BG range was 75->400, compared with 86->400 last visit. Her highest BGS occur after receiving her weekly steroids. She needs more basal insulin overall. She also needs to add 2 units of Novolog at meals on the days she has steroids.    PAST MEDICAL, FAMILY, AND SOCIAL HISTORY: 1. 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 She was married in May 2020. She will be changing her last name to [place] soon. We discussed the issue of having her own children vs adopting.  2. Activities: She has been walking more.  3. Smoking, alcohol, or drugs: None 4. Primary Care Provider: Dr. Garnet Koyanagi at [facility]. 5. Neurologist: Dr. Gustavus Bryant, [facility] every 6 months and Dr. Gaynell Face for annual visits.  REVIEW OF SYSTEMS: There are no other significant problems involving the patient's other body systems.  PHYSICAL EXAM: BP 124/82   Pulse 88   Wt 140 lb 3.2 oz (63.6 kg)   BMI 24.45 kg/m   Wt Readings from Last 3 Encounters:  01/17/20 140 lb 3.2 oz (63.6 kg)  01/05/20 138 lb (62.6 kg)  06/23/19 137 lb 6.4 oz (62.3 kg)    Ht Readings from Last 3 Encounters:  01/05/20 5' 3.5" (1.613 m)  06/23/19 5' 3.5" (1.613 m)  05/11/19 5' 3.5" (1.613 m)    HC Readings from Last 3 Encounters:  No data found for [facility]   Facility age limit for growth percentiles is 20  years.  Body mass index is 24.45 kg/m. Facility age limit for growth percentiles is 20 years.  Body surface area is 1.69 meters squared.  Constitutional: This patient is in her wheelchair today. She appears healthy and looks pretty good. She has gained 3 pounds in 6 months. Her affect and insight are quite good today.  Head: The head is normocephalic. Face: The face appears normal. There are no obvious dysmorphic features. Eyes: The eyes appear to be normally formed and spaced. Gaze is conjugate. There is no obvious arcus or proptosis. Moisture appears normal. Mouth: The oropharynx and tongue appear normal. Dentition appears to be normal for age. Oral moisture is normal. She has no tongue tremor.  Neck: On inspection the thyroid gland is again visibly enlarged. The gland is slightly smaller at about 20-21 grams in size. Today the left lobe has shrunk back to normal size and the right lobe is only minimally enlarged. The thyroid gland is softer today and is not tender to palpation. Lungs: The lungs are clear to auscultation. Air movement is good. Heart: Heart rate is very fast. Heart rhythm is regular. Heart sounds S1 and S2 are normal. I do not hear her previous grade 2/6 SEM flow murmur due to her Graves' disease.   Abdomen: The abdomen is larger in size today. Bowel sounds are normal. There is no obvious hepatomegaly, splenomegaly, or other mass effect.  Arms: Muscle size and bulk are low-normal for age. Hands: There is a faint, fine tremor today. Phalangeal and metacarpophalangeal joints are normal. Palmar muscles are low-normal for age. Palmar skin shows no palmar erythema. Palmar moisture is normal.. Legs: Muscles appear small for age, especially in the right leg. No edema is present. Feet: Dorsalis pedal pulses are faint 1+ on the right and 1+ on the left.  Neurologic: Strength is 4-5/5 for age in the upper extremities, 2/5  in the right leg, and 4/5 in the left leg. Muscle tone is somewhat  low. Sensation to touch is normal in both her legs and feet.   Skin: No rashes Scalp: Overall the scalp looks pretty good.   LAB DATA:  Labs 01/17/20; HbA1c 9.1%, CBG 245  Labs 06/23/19: HbA1c 10.3%, CBG 282; TSH 1.97, free T4 1.1, free T3 3.9; CMP abnormal, with glucose 296, sodium 131 (ref 135-146), calcium 8.3 (ref 8.6-10.2), albumin 3.4 (ref 3.6-5.1), globulin 5.8 (ref 1.9-3.7); CBC normal;  Labs 11/05/18: CBG: 192; TSH 0.03, free T4 1.6, free T3 3.9, TSI <89; CMP normal except glucose of 341, sodium 131, albumin 3.2 (ref 3.6-5.1), globulin 7.2 (ref 1.9-3.7); cholesterol 157, triglycerides 62, HDL 52, LDL 91; urinary microalbumin/creatinine ratio 40  Labs 08/19/18: HbA1c 10.3%, CBG 174  Labs 04/27/18: HbA1c 9.1%, CBG 270; TSH 0.11, free T4 1.2, free T3 3.2; CMP normal, except glucose 264, sodium 131, albumin 3.3 (ref 3.6-5.1), globulin 8.2 (ref 1.9-3.7); CBC normal, except lymphocytes 257 (ref [phone])  Labs 02/23/18: CBG 264; TSH 0.35, free T4 1.1, free T3 3.7; CMP normal except glucose of 153, total protein 11.4 (ref 6.1-8.1), albumin 3.2 (ref 3.6-5.1), globulin 8.2 (ref 1.9-3.7); amylase 42 (ref 21-101)  Labs 01/09/18: HbA1c 10.2%  Labs 08/27/17: CBG 220  Labs 06/25/17: HbA1c 9.6%, CBG 92; TSH 1.92, free T4 1.1, free T3 3.1, TSI <89; urine microalbumin/creatinine ratio 53 (ref <30); CBC normal; CMP normal, except for glucose 224, sodium 132, albumin 3.2 (ref 3.6-5.1), calcium 8.5 (ref 8.6-10.2, corrected to 9.2 for albumin level), globulin 6.1 (ref 1.9-3.7)  Labs 01/09/17: CBG 293  Labs 12/20/16: ACTH stimulation test: Cortisols 3, 18.9, 24.5; sodium 130, albumin 2.8  Labs 12/17/16: HbA1c 10.1%; TSH 1.14, free T4 1.2, free T3 2.6, TSI <89; WBC 15/8, Hgb 13.1, Hct 40/8%  Labs 07/11/16: HbA1c 11.5%, CBG 225  Labs 06/12/16: TSH 2.20, free T4 1.02, free T3 3.0; CBC normal; CMP normal except sodium 133, glucose 259, albumin 2.8, calcium 8.3  Labs 02/22/16: HbA1c 10.7%  Labs 10/18/15: HbA1c  10.8%  Labs 05/09/15: HbA1c 11.1%  Labs 04/07/15: CBC normal; TSH 1.826, free T4 1.06, free T3 2.8, TSI 156; iron 234, CMP normal except for sodium 132, glucose 159, albumin 2.9, calcium 8.7; ACTH 11, cortisol 10.5  Labs 11/08/14: HbA1c 10.9%; TSH 2.234, free T4 0.87, free T3 2.7, TSI pending  Labs 06/01/14: HbA1c is 9.7% today, compared with 11.5% at last visit. Other labs are pending.  Labs 02/17/14: TSH 2.125, free T4 0.91, free T3 3.0  Labs 07/09/13; HbA1c was 11.4%, compared with 9.1% at last visit and with 14.0% at the visit prior. TSH was 0.652, free T4 0.88, free T3 3.3  Labs 10/06/12: TSH 0.088, free T4 1.28, free T3 3.1  Labs 02/25/12: TSH 1.044, free T4 1.45, free T3 3.4  Labs 06/14/11: TSH was 1.633. Free T4 1.47. Free T3 was 3.1. TSI was 56 (normal less than 120).                       ASSESSMENT: 1. Type 1 diabetes mellitus:   A. Her BG control has improved, but needs further improvement. She is much more consistent about checking BGs and giving injections than she was. She needs more Lantus insulin and more Novolog on the days she takes steroids.   B. She really needs the T-Slim pump and Dexcom G6 sensor and is now interested in obtaining them.  2.  Hypoglycemia: She had not  had any documented low BGs in the past two weeks, but has had symptoms earlier this month. She feels the low BGs coming on if they drop rapidly, but does not always feel the low BGs that occur slowly and progressively until she is quite low.  3. Autonomic neuropathy with tachycardia and gastroparesis: Her heart rate is lower, hopefully because both her T1DM and her Graves' disease are under better control.   4-8 Autoimmune thyroid disease, Graves' disease, Hashimoto's disease, and goiter:   A. Thyrotoxicosis: Her diffuse thyrotoxicosis, secondary to Graves' disease, has waxed and waned over time.  Her thyrotoxicosis was well controlled by her current methimazole doses in January 2018 and again in February  2019. In October 2019, however, she was again hyperthyroid, so I asked her to increase the methimazole to 15 mg, twice daily. She heard 15 mg daily, so she had not had adequate treatment for her Berenice Primas' disease for several months. At her last visit in February 2021 I asked her to take 10 mg of MTZ daily. We need new TFTs.   B. Hashimoto's thyroiditis: The patient's thyroiditis is clinically quiescent at this time. However, the pattern in which all 3 of her TFTs decreased in parallel together from 2013 to 2014 indicated that she had had a relatively recent flare up of Hashimoto's Dz. I expect that as her Hashimoto's disease progressively destroys more thyroid cells, we will be able to taper her methimazole to zero. She will probably become permanently hypothyroid over time and will require Synthroid hormone replacement.  C. Goiter: Thyroid gland is smaller today, c/w an improvement of Graves Dz. The waxing and waning of thyroid lobe size is c/w both with evolving Hashimoto's disease and fluctuating Graves' Dz activity.  9. Celiac disease: Amarilys is following her gluten-free diet and is doing well.  10.  Chronic inflammatory demyelinating polyneuropathy (CIDP) and muscle weakness: Her left leg is stronger, but her right leg remains very weak.    11-12. Fatigue and decreased stamina: These problems are better. Since she had been on a regimen of 1-2 iv glucocorticoid treatment per week for many months, it was possible that her adrenals were suppressed. However, her last ACTH and cortisol levels in November 2016 were normal. Her ACTH stimulation test results in July 2018 were also quite normal.  13. Emotionality: Her anxiety has essentially resolved.  14-15. Dyspepsia-GERD: She is doing fairly well overall. 16. Hypertension: Her SBP is good today, but her DBP is still elevated. Given Chesley's muscular weakness and tendency to be physically unbalanced, it is not appropriate to increase her BP medication too  much, otherwise she will have more falls.  17.  Adjustment reaction to illness and medical therapy: I continue to be impressed and inspired by Reylene's dignity and courage as she tries to do all that she can to improve her own health.  PLAN: 1. Diagnostic: HbA1c and CBG today. Repeat TFTs. CMP, microalbumin/creatinine ratio.   2. Therapeutic:   A. Increase the Lantus dose to 36 units as tolerated. Continue the current Novolog plan, but increase the doses at meals on the day of steroids by 2 units at each meal. Check BGs at meals and at bedtime every day.   B. Continue current doses of her endocrine meds. We will adjust her MTZ dose after seeing her lab results.    C. We will arrange an appointment with Dr. Drexel Iha to start the paperwork for a T-slim pump and Dexcom G6.  3. Patient education: We discussed  all of the above at great length.  4. Follow-up: 2 months in a new patient slot.   Level of Service: This visit lasted in excess of 70 minutes. More than 50% of the visit was devoted to counseling  Tillman Sers, MD, CDE Adult and Pediatric Endocrinology

## 2020-01-17 NOTE — Patient Instructions (Addendum)
Follow up visit in 2 months.  

## 2020-01-19 ENCOUNTER — Telehealth (INDEPENDENT_AMBULATORY_CARE_PROVIDER_SITE_OTHER): Payer: Self-pay | Admitting: Pharmacist

## 2020-01-19 NOTE — Telephone Encounter (Signed)
The following prior authorizations have been approved  Jessica Kelley (Key: [id number]) Dexcom G6 Sensor Status: PA Response - Approved Created: September 1st, 2021 Sent: September 1st, 2021  Jessica Kelley (Key: [id number]) Dexcom G6 Transmitter Status: PA Response - Approved Created: September 1st, 2021 Sent: September 1st, 2021  Jessica Kelley (Key: [id number]) Dexcom G6 Receiver device Status: PA Response - Approved Created: September 1st, 2021 Sent: September 1st, 2021

## 2020-01-27 ENCOUNTER — Encounter (INDEPENDENT_AMBULATORY_CARE_PROVIDER_SITE_OTHER): Payer: Self-pay

## 2020-01-27 ENCOUNTER — Telehealth (INDEPENDENT_AMBULATORY_CARE_PROVIDER_SITE_OTHER): Payer: Self-pay | Admitting: Pharmacist

## 2020-01-27 DIAGNOSIS — E1065 Type 1 diabetes mellitus with hyperglycemia: Secondary | ICD-10-CM

## 2020-01-27 MED ORDER — DEXCOM G6 SENSOR MISC: 1.0000 | 11 refills | Status: DC

## 2020-01-27 MED ORDER — DEXCOM G6 RECEIVER DEVI: 1.0000 | 2 refills | Status: DC

## 2020-01-27 MED ORDER — DEXCOM G6 TRANSMITTER MISC: 1.0000 | 3 refills | Status: DC

## 2020-01-27 NOTE — Telephone Encounter (Signed)
Called patient's spouse on 01/27/2020 at 4:06 PM . Attempted to contact patient, howeveer, was unsuccessful.  Asked spouse to advise patient to contact office to schedule 60 min education appt for dexcom training  Thank you for involving clinical pharmacist/diabetes educator to assist in providing this patient's care.   Drexel Iha, PharmD, CPP

## 2020-01-27 NOTE — Telephone Encounter (Signed)
Error

## 2020-01-27 NOTE — Addendum Note (Signed)
Addended by: Ellwood Handler on: 01/27/2020 04:14 PM   Modules accepted: Orders

## 2020-02-04 ENCOUNTER — Telehealth (INDEPENDENT_AMBULATORY_CARE_PROVIDER_SITE_OTHER): Payer: Self-pay | Admitting: Pediatrics

## 2020-02-04 DIAGNOSIS — G6181 Chronic inflammatory demyelinating polyneuritis: Secondary | ICD-10-CM

## 2020-02-04 DIAGNOSIS — R269 Unspecified abnormalities of gait and mobility: Secondary | ICD-10-CM

## 2020-02-04 NOTE — Telephone Encounter (Signed)
Material has been copied and placed on Tiffanie's desk for disposition.

## 2020-02-04 NOTE — Telephone Encounter (Signed)
  Who's calling (name and relationship to patient) : Yodis,Lauren Best contact number: [phone] Provider they see: Gaynell Face Reason for call: Please fax RX, narrative, ID #, and insurance information to [facility] [name]'s [facility] wheelchair.  Mom just left [facility] and thinks that the [id number] will be the perfect fit.  Fax numbers: [phone]    PRESCRIPTION REFILL ONLY  Name of prescription:  Pharmacy:

## 2020-02-04 NOTE — Telephone Encounter (Signed)
Material has been faxed

## 2020-02-14 NOTE — Progress Notes (Deleted)
S:     No chief complaint on file.   Endocrinology provider: Dr. Tobe Sos (upcoming appt 03/29/20 2:15 PM)  Patient presents today for Dexcom G6 application. PMH significant for T1DM, celiac disease, autoimmune thyroiditis, goiter, migraine without aura, scoliosis, chronic infammatory dmylnating polyneuritis, anemia, macular degeneration, abnormality of gait, and insomnia.   Discuss Tandem pump initiation Dexcom copay card for future?  Insurance Coverage: BCBS  Preferred Pharmacy: [facility] ([facility]), [place] [address]  [address], [place] ([place]) [place] [address]  Phone:  [phone] Fax:  [phone]  DEA #:  --  Medication Adherence -Patient {Actions; denies-reports:120008} adherence with medications.  -Current diabetes medications include: Lantus 36 units, Novolog 150/50/5 (increase the doses at meals on the day of steroids by 2 units at each meal) -Prior diabetes medications include: ***  Patient {Actions; denies-reports:120008} taking hydroxyurea and/or >4 g of APAP.  Dexcom G6 patient education Person(s)instructed: ***  Instruction: Patient oriented to three components of Dexcom G6 continuous glucose monitor (sensor, transmitter, receiver/cellphone) Receiver or cellphone: *** -Dexcom G6 AND dexcom clarity app downloaded onto cellphone:***  -Patient educated that Dexom G6 app must always be running (patient should not close out of app) -If using Dexcom G6 app, patient may share blood glucose data with up to 10 followers on dexcom follow app. Dexcom G6 account email: ***  Dexcom G6 account password: *** Sensor code: Artist code: *** Sharing code: ***  CGM overview and set-up  1. Button, touch screen, and icons 2. Power supply and recharging 3. Home screen 4. Date and time 5. Set BG target range: *** 6. Set alarm/alert tone  7. Interstitial vs. capillary blood glucose readings  8. When to verify sensor reading with  fingerstick blood glucose 9. Blood glucose reading measured every five minutes. 10. Sensor will last 10 days 11. Transmitter will last 90 days and must be reused  12. Transmitter must be within 20 feet of receiver/cell phone.  Sensor application -- sensor placed on *** 1. Site selection and site prep with alcohol pad 2. Sensor prep-sensor pack and sensor applicator 3. Sensor applied to area away from waistband, scarring, tattoos, irritation, and bones 4. Transmitter sanitized with alcohol pad and inserted into sensor. 5. Starting the sensor: 2 hour warm up before BG readings available 6. Sensor change every 10 days and rotate site 7. Call Dexcom customer service if sensor comes off before 10 days  Safety and Troubleshooting 1. Do a fingerstick blood glucose test if the sensor readings do not match how    you feel 2. Remove sensor prior to magnetic resonance imaging (MRI), computed tomography (CT) scan, or high-frequency electrical heat (diathermy) treatment. 3. Do not allow sun screen or insect repellant to come into contact with Dexcom G6. These skin care products may lead for the plastic used in the Dexcom G6 to crack. 4. Dexcom G6 may be worn through a Environmental education officer. It may not be exposed to an advanced Imaging Technology (AIT) body scanner (also called a millimeter wave scanner) or the baggage x-ray machine. Instead, ask for hand-wanding or full-body pat-down and visual inspection.  5. Doses of acetaminophen (Tylenol) >1 gram every 6 hours may cause false high readings. 6. Hydroxyurea (Hydrea, Droxia) may interfere with accuracy of blood glucose readings from Dexcom G6. 7. Store sensor kit between 36 and 86 degrees Farenheit. Can be refrigerated within this temperature range.  Contact information provided for [facility] customer service and/or trainer.  O:  Labs:   There were no vitals filed for this visit.  Lab Results  Component Value Date   HGBA1C 9.1 (A)  01/17/2020   HGBA1C 10.3 (A) 06/23/2019   HGBA1C 10.3 (A) 08/19/2018    No results found for: CPEPTIDE     Component Value Date/Time   CHOL 157 11/05/2018 1512   TRIG 62 11/05/2018 1512   HDL 52 11/05/2018 1512   CHOLHDL 3.0 11/05/2018 1512   VLDL 25 04/25/2008 0923   LDLCALC 91 11/05/2018 1512   LDLDIRECT 136.9 04/25/2008 0923    Lab Results  Component Value Date   MICRALBCREAT 22 06/23/2019    Assessment: Dexcom G6 CGM placed on patient's *** successfully.  Plan: 1. Monitoring:  a. Jessica Kelley has a diagnosis of diabetes, checks blood glucose readings > 4x per day, treats with > 3 insulin injections or wears an insulin pump, and requires frequent adjustments to insulin regimen. This patient will be seen every six months, minimally, to assess adherence to their CGM regimen and diabetes treatment plan. 2. Follow Up:   Written patient instructions provided.    This appointment required *** minutes of patient care (this includes precharting, chart review, review of results, face-to-face care, etc.).  Thank you for involving clinical pharmacist/diabetes educator to assist in providing this patient's care.  Drexel Iha, PharmD, CPP

## 2020-02-23 ENCOUNTER — Other Ambulatory Visit (INDEPENDENT_AMBULATORY_CARE_PROVIDER_SITE_OTHER): Payer: BC Managed Care – PPO | Admitting: Pharmacist

## 2020-02-23 NOTE — Telephone Encounter (Signed)
Mom (Lauren) called back regarding wheelchair - states that she has not heard anything from the DME company. She requests call back at [phone]

## 2020-02-23 NOTE — Telephone Encounter (Signed)
Spoke with mom about her phone message. Informed her that the information she needed for [place] to send to [facility] was faxed two weeks ago. She stated that she will reach put to them

## 2020-03-02 NOTE — Telephone Encounter (Signed)
advanced home care is now called [facility]. [facility] never received any of the information that was to be faxed to them. Mom is requesting the information be faxed again to [phone] or [phone]  Mom would like to know when this is done.

## 2020-03-02 NOTE — Telephone Encounter (Signed)
Information has been faxed as requested

## 2020-03-09 ENCOUNTER — Other Ambulatory Visit (INDEPENDENT_AMBULATORY_CARE_PROVIDER_SITE_OTHER): Payer: Self-pay | Admitting: "Endocrinology

## 2020-03-09 DIAGNOSIS — E0500 Thyrotoxicosis with diffuse goiter without thyrotoxic crisis or storm: Secondary | ICD-10-CM

## 2020-03-14 ENCOUNTER — Encounter: Payer: Self-pay | Admitting: Family Medicine

## 2020-03-14 ENCOUNTER — Ambulatory Visit (INDEPENDENT_AMBULATORY_CARE_PROVIDER_SITE_OTHER): Payer: BC Managed Care – PPO | Admitting: Family Medicine

## 2020-03-14 ENCOUNTER — Other Ambulatory Visit: Payer: Self-pay

## 2020-03-14 VITALS — BP 124/80 | HR 110 | Temp 98.2°F | Resp 18 | Ht 63.5 in | Wt 145.0 lb

## 2020-03-14 DIAGNOSIS — G6181 Chronic inflammatory demyelinating polyneuritis: Secondary | ICD-10-CM | POA: Diagnosis not present

## 2020-03-14 DIAGNOSIS — Z0000 Encounter for general adult medical examination without abnormal findings: Secondary | ICD-10-CM | POA: Diagnosis not present

## 2020-03-14 DIAGNOSIS — Z8679 Personal history of other diseases of the circulatory system: Secondary | ICD-10-CM | POA: Insufficient documentation

## 2020-03-14 DIAGNOSIS — F429 Obsessive-compulsive disorder, unspecified: Secondary | ICD-10-CM | POA: Insufficient documentation

## 2020-03-14 DIAGNOSIS — S065X1A Traumatic subdural hemorrhage with loss of consciousness of 30 minutes or less, initial encounter: Secondary | ICD-10-CM | POA: Diagnosis not present

## 2020-03-14 DIAGNOSIS — S065X9A Traumatic subdural hemorrhage with loss of consciousness of unspecified duration, initial encounter: Secondary | ICD-10-CM | POA: Insufficient documentation

## 2020-03-14 DIAGNOSIS — S065XAA Traumatic subdural hemorrhage with loss of consciousness status unknown, initial encounter: Secondary | ICD-10-CM | POA: Insufficient documentation

## 2020-03-14 NOTE — Patient Instructions (Signed)
Preventive Care 21-27 Years Old, Female Preventive care refers to visits with your health care provider and lifestyle choices that can promote health and wellness. This includes:  A yearly physical exam. This may also be called an annual well check.  Regular dental visits and eye exams.  Immunizations.  Screening for certain conditions.  Healthy lifestyle choices, such as eating a healthy diet, getting regular exercise, not using drugs or products that contain nicotine and tobacco, and limiting alcohol use. What can I expect for my preventive care visit? Physical exam Your health care provider will check your:  Height and weight. This may be used to calculate body mass index (BMI), which tells if you are at a healthy weight.  Heart rate and blood pressure.  Skin for abnormal spots. Counseling Your health care provider may ask you questions about your:  Alcohol, tobacco, and drug use.  Emotional well-being.  Home and relationship well-being.  Sexual activity.  Eating habits.  Work and work environment.  Method of birth control.  Menstrual cycle.  Pregnancy history. What immunizations do I need?  Influenza (flu) vaccine  This is recommended every year. Tetanus, diphtheria, and pertussis (Tdap) vaccine  You may need a Td booster every 10 years. Varicella (chickenpox) vaccine  You may need this if you have not been vaccinated. Human papillomavirus (HPV) vaccine  If recommended by your health care provider, you may need three doses over 6 months. Measles, mumps, and rubella (MMR) vaccine  You may need at least one dose of MMR. You may also need a second dose. Meningococcal conjugate (MenACWY) vaccine  One dose is recommended if you are age 19-21 years and a first-year college student living in a residence hall, or if you have one of several medical conditions. You may also need additional booster doses. Pneumococcal conjugate (PCV13) vaccine  You may need  this if you have certain conditions and were not previously vaccinated. Pneumococcal polysaccharide (PPSV23) vaccine  You may need one or two doses if you smoke cigarettes or if you have certain conditions. Hepatitis A vaccine  You may need this if you have certain conditions or if you travel or work in places where you may be exposed to hepatitis A. Hepatitis B vaccine  You may need this if you have certain conditions or if you travel or work in places where you may be exposed to hepatitis B. Haemophilus influenzae type b (Hib) vaccine  You may need this if you have certain conditions. You may receive vaccines as individual doses or as more than one vaccine together in one shot (combination vaccines). Talk with your health care provider about the risks and benefits of combination vaccines. What tests do I need?  Blood tests  Lipid and cholesterol levels. These may be checked every 5 years starting at age 20.  Hepatitis C test.  Hepatitis B test. Screening  Diabetes screening. This is done by checking your blood sugar (glucose) after you have not eaten for a while (fasting).  Sexually transmitted disease (STD) testing.  BRCA-related cancer screening. This may be done if you have a family history of breast, ovarian, tubal, or peritoneal cancers.  Pelvic exam and Pap test. This may be done every 3 years starting at age 21. Starting at age 30, this may be done every 5 years if you have a Pap test in combination with an HPV test. Talk with your health care provider about your test results, treatment options, and if necessary, the need for more tests.   Follow these instructions at home: Eating and drinking   Eat a diet that includes fresh fruits and vegetables, whole grains, lean protein, and low-fat dairy.  Take vitamin and mineral supplements as recommended by your health care provider.  Do not drink alcohol if: ? Your health care provider tells you not to drink. ? You are  pregnant, may be pregnant, or are planning to become pregnant.  If you drink alcohol: ? Limit how much you have to 0-1 drink a day. ? Be aware of how much alcohol is in your drink. In the [place] one drink equals one 12 oz bottle of beer (355 mL), one 5 oz glass of wine (148 mL), or one 1 oz glass of hard liquor (44 mL). Lifestyle  Take daily care of your teeth and gums.  Stay active. Exercise for at least 30 minutes on 5 or more days each week.  Do not use any products that contain nicotine or tobacco, such as cigarettes, e-cigarettes, and chewing tobacco. If you need help quitting, ask your health care provider.  If you are sexually active, practice safe sex. Use a condom or other form of birth control (contraception) in order to prevent pregnancy and STIs (sexually transmitted infections). If you plan to become pregnant, see your health care provider for a preconception visit. What's next?  Visit your health care provider once a year for a well check visit.  Ask your health care provider how often you should have your eyes and teeth checked.  Stay up to date on all vaccines. This information is not intended to replace advice given to you by your health care provider. Make sure you discuss any questions you have with your health care provider. Document Revised: 01/15/2018 Document Reviewed: 01/15/2018 Elsevier Patient Education  2020 [employer].

## 2020-03-14 NOTE — Progress Notes (Signed)
Subjective:     Jessica Kelley is a 29 y.o. female and is here for a comprehensive physical exam. The patient reports no new problems-- dmv paperwork need to be filled up .  Social History   Socioeconomic History  . Marital status: Single    Spouse name: Not on file  . Number of children: Not on file  . Years of education: Not on file  . Highest education level: Not on file  Occupational History  . Not on file  Tobacco Use  . Smoking status: Never Smoker  . Smokeless tobacco: Never Used  . Tobacco comment: passive smoke exposure  Vaping Use  . Vaping Use: Never used  Substance and Sexual Activity  . Alcohol use: Yes    Alcohol/week: 0.0 standard drinks    Comment: OCC  . Drug use: No  . Sexual activity: Not on file  Other Topics Concern  . Not on file  Social History Narrative   Jessica Kelley is a 29 yo woman    She is employed with [employer].   She lives with her husband and has one sister, 28 yo.   She enjoys working, camping, and walking outside.   Social Determinants of Health   Financial Resource Strain:   . Difficulty of Paying Living Expenses: Not on file  Food Insecurity:   . Worried About Charity fundraiser in the Last Year: Not on file  . Ran Out of Food in the Last Year: Not on file  Transportation Needs:   . Lack of Transportation (Medical): Not on file  . Lack of Transportation (Non-Medical): Not on file  Physical Activity:   . Days of Exercise per Week: Not on file  . Minutes of Exercise per Session: Not on file  Stress:   . Feeling of Stress : Not on file  Social Connections:   . Frequency of Communication with Friends and Family: Not on file  . Frequency of Social Gatherings with Friends and Family: Not on file  . Attends Religious Services: Not on file  . Active Member of Clubs or Organizations: Not on file  . Attends Archivist Meetings: Not on file  . Marital Status: Not on file  Intimate Partner Violence:   . Fear of  Current or Ex-Partner: Not on file  . Emotionally Abused: Not on file  . Physically Abused: Not on file  . Sexually Abused: Not on file   Health Maintenance  Topic Date Due  . Hepatitis C Screening  Never done  . PNEUMOCOCCAL POLYSACCHARIDE VACCINE AGE 61-64 HIGH RISK  Never done  . FOOT EXAM  Never done  . COVID-19 Vaccine (1) Never done  . HIV Screening  Never done  . OPHTHALMOLOGY EXAM  05/02/2012  . TETANUS/TDAP  01/07/2016  . INFLUENZA VACCINE  12/19/2019  . PAP-Cervical Cytology Screening  01/09/2020  . PAP SMEAR-Modifier  01/09/2020  . 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 She does not have any pertinent problems on file. She  has a past surgical history that includes Cataract extraction (06/25/06); Glaucoma surgery (02/12/06); Foot arthroplasty; Hip Arthroplasty; Portacath placement (2002-2004); removal of vascular port; and Sciatic Nerve Muscle Biopsy. Her family history includes Cancer  in her maternal grandfather and paternal grandmother; Coronary artery disease in her paternal grandfather; Diabetes in her sister; Hyperlipidemia in an other family member; Other in her sister; Prostate cancer in her maternal grandfather; Thyroid disease in her mother. She  reports that she has never smoked. She has never used smokeless tobacco. She reports current alcohol use. She reports that she does not use drugs. She has a current medication list which includes the following prescription(s): lantus solostar, lisinopril, methimazole, methylprednisolone sodium succinate, multivitamin with minerals, novolog flexpen, propranolol, contour next monitor, vitamin  d, dexcom g6 receiver, dexcom g6 sensor, dexcom g6 transmitter, cyanocobalamin, glucagon emergency, glucose blood, immune globulin (human), and insulin pen needle. Current Outpatient Medications on File Prior to Visit  Medication Sig Dispense Refill  . LANTUS SOLOSTAR 100 UNIT/ML Solostar Pen INJECT UP TO 50 UNITS SUBCUTANEOUSLY ONCE DAILY 45 mL 1  . lisinopril (ZESTRIL) 2.5 MG tablet Take 1 tablet by mouth once daily 90 tablet 1  . methimazole (TAPAZOLE) 5 MG tablet TAKE 3 TABLETS BY MOUTH TWICE DAILY 270 tablet 1  . methylPREDNISolone sodium succinate (SOLU-MEDROL) 500 MG injection Give 572m IV in 50 ml NS over 1 hour x 1 every 4 weeks. Given by [email or web address] each 12  . Multiple Vitamins-Minerals (MULTIVITAMIN WITH MINERALS) tablet Take by mouth.    .Marland KitchenNOVOLOG FLEXPEN 100 UNIT/ML FlexPen Inject up to 50 units daily 45 mL 1  . propranolol (INDERAL) 10 MG tablet Take 1/2 to 1 tablet daily. 30 tablet 11  . Blood Glucose Monitoring Suppl (CONTOUR NEXT MONITOR) w/Device KIT 1 Units by Does not apply route daily. (Patient not taking: Reported on 01/17/2020) 2 kit 0  . Cholecalciferol (VITAMIN D) 1000 UNITS capsule Take 1,000 Units by mouth daily.   (Patient not taking: Reported on 01/17/2020)    . Continuous Blood Gluc Receiver (DEXCOM G6 RECEIVER) DEVI 1 Device by Does not apply route as directed. 1 each 2  . Continuous Blood Gluc Sensor (DEXCOM G6 SENSOR) MISC Inject 1 applicator into the skin as directed. (change sensor every 10 days) 3 each 11  . Continuous Blood Gluc Transmit (DEXCOM G6 TRANSMITTER) MISC Inject 1 Device into the skin as directed. (re-use up to 8x with each new sensor) 1 each 3  . Cyanocobalamin (VITAMIN B-12 PO) Take by mouth. (Patient not taking: Reported on 01/17/2020)    . Glucagon, rDNA, (GLUCAGON EMERGENCY) 1 MG KIT Use in case of severe low glucose (Patient not taking: Reported on 01/17/2020) 1 kit 9  . glucose blood (CONTOUR NEXT TEST) test strip Use to check glucose  6x daily (Patient not taking: Reported on 01/17/2020) 200 each 6  . Immune Globulin, Human, 5 GM/50ML SOLN Inject into the vein. (Patient not taking: Reported on 01/17/2020)    . Insulin Pen Needle (INSUPEN PEN NEEDLES) 32G X 4 MM MISC Inject insulin via insulin pen 7 x daily (Patient not taking: Reported on 01/17/2020) 250 each 5   No current facility-administered medications on file prior to visit.   She is allergic to gluten meal and wheat bran..  Review of Systems Review of Systems  Constitutional: Negative for activity change, appetite change and fatigue.  HENT: Negative for hearing loss, congestion, tinnitus and ear discharge.  dentist q651myes: Negative for visual disturbance (see optho q1y -- vision corrected to 20/20 with glasses).  Respiratory: Negative for cough, chest tightness and shortness of breath.   Cardiovascular: Negative for chest pain, palpitations and leg swelling.  Gastrointestinal: Negative for  abdominal pain, diarrhea, constipation and abdominal distention.  Genitourinary: Negative for urgency, frequency, decreased urine volume and difficulty urinating.  Musculoskeletal: Negative for back pain, arthralgias and gait problem.  Skin: Negative for color change, pallor and rash.  Neurological: Negative for dizziness, light-headedness, numbness and headaches.  Hematological: Negative for adenopathy. Does not bruise/bleed easily.  Psychiatric/Behavioral: Negative for suicidal, confusion, sleep disturbance, self-injury, dysphoric mood, decreased concentration and agitation.   + Ocd       Objective:    BP 124/80 (BP Location: Right Arm, Patient Position: Sitting, Cuff Size: Normal)   Pulse (!) 110   Temp 98.2 F (36.8 C) (Oral)   Resp 18   Ht 5' 3.5" (1.613 m)   Wt 145 lb (65.8 kg)   SpO2 99%   BMI 25.28 kg/m  General appearance: alert, cooperative, appears stated age and no distress Head: Normocephalic, without obvious abnormality, atraumatic Eyes: negative  findings: lids and lashes normal, conjunctivae and sclerae normal and pupils equal, round, reactive to light and accomodation Ears: normal TM's and external ear canals both ears Neck: no adenopathy, no carotid bruit, no JVD, supple, symmetrical, trachea midline and thyroid not enlarged, symmetric, no tenderness/mass/nodules Back: negative Lungs: clear to auscultation bilaterally Breasts: gyn Heart: regular rate and rhythm, S1, S2 normal, no murmur, click, rub or gallop Abdomen: soft, non-tender; bowel sounds normal; no masses,  no organomegaly Pelvic: deferred --gyn Extremities: extremities normal, atraumatic, no cyanosis or edema Pulses: 2+ and symmetric Skin: Skin color, texture, turgor normal. No rashes or lesions Lymph nodes: Cervical, supraclavicular, and axillary nodes normal. Neurologic: normal except L leg weakness due to    Assessment:    Healthy female exam.     Plan:    ghm utd Check labs  See After Visit Summary for Counseling Recommendations    1. Subdural hematoma due to concussion, with loss of consciousness of 30 minutes or less, initial encounter ([place]) Resolved   2. Preventative health care See above   3. CIDP (chronic inflammatory demyelinating polyneuropathy) ([facility]) Per neuro   4. Obsessive-compulsive disorder, unspecified type Pt does not want to start meds---  She will look into counseling first and rto prn

## 2020-03-29 ENCOUNTER — Ambulatory Visit (INDEPENDENT_AMBULATORY_CARE_PROVIDER_SITE_OTHER): Payer: BC Managed Care – PPO | Admitting: "Endocrinology

## 2020-04-26 ENCOUNTER — Telehealth (INDEPENDENT_AMBULATORY_CARE_PROVIDER_SITE_OTHER): Payer: Self-pay | Admitting: Pediatrics

## 2020-04-26 NOTE — Telephone Encounter (Signed)
I received the following staff message from Rockwell Germany, NP:  Dr Gaynell Face asked me to contact you regarding his patient Jessica Kelley. She has an appointment with him in February but she will need to be seen sometime before June 04, 2020 in order for her insurance to approve her treatment with IV Gammagard. He asked for her appointment to be rescheduled with him sometime this month if possible or very early January.  Please let me know if you have questions.  Thanks,   **I called patient's number and left a voicemail advising I had scheduled her to see Dr. Gaynell Face on 05/10/2020 at 9:00AM. I asked patient to return my call to confirm this appointment date and time. [name]

## 2020-04-27 ENCOUNTER — Telehealth (INDEPENDENT_AMBULATORY_CARE_PROVIDER_SITE_OTHER): Payer: Self-pay | Admitting: Pediatrics

## 2020-04-27 NOTE — Telephone Encounter (Signed)
I will take care of this tomorrow.

## 2020-04-27 NOTE — Telephone Encounter (Signed)
I have received the DMV forms and will print them. They have been placed on Dr. Melanee Left desk

## 2020-04-27 NOTE — Telephone Encounter (Signed)
  Who's calling (name and relationship to patient) : Makayela (patient)  Best contact number: [phone]  Provider they see: Dr. Gaynell Face  Reason for call: Patient states that she needs paperwork filled out for the [facility] as well as a letter from Dr. Gaynell Face. She is emailing the paperwork that needs to be filled out to the pssg email.    PRESCRIPTION REFILL ONLY  Name of prescription:  Pharmacy:

## 2020-05-03 ENCOUNTER — Ambulatory Visit: Payer: BC Managed Care – PPO | Admitting: *Deleted

## 2020-05-03 ENCOUNTER — Other Ambulatory Visit: Payer: Self-pay

## 2020-05-03 ENCOUNTER — Ambulatory Visit: Payer: BC Managed Care – PPO | Attending: Obstetrics & Gynecology | Admitting: Obstetrics

## 2020-05-03 ENCOUNTER — Encounter: Payer: Self-pay | Admitting: *Deleted

## 2020-05-03 DIAGNOSIS — Z794 Long term (current) use of insulin: Secondary | ICD-10-CM | POA: Insufficient documentation

## 2020-05-03 DIAGNOSIS — G6181 Chronic inflammatory demyelinating polyneuritis: Secondary | ICD-10-CM | POA: Diagnosis not present

## 2020-05-03 DIAGNOSIS — E109 Type 1 diabetes mellitus without complications: Secondary | ICD-10-CM | POA: Diagnosis not present

## 2020-05-03 DIAGNOSIS — G629 Polyneuropathy, unspecified: Secondary | ICD-10-CM

## 2020-05-03 DIAGNOSIS — E0590 Thyrotoxicosis, unspecified without thyrotoxic crisis or storm: Secondary | ICD-10-CM | POA: Insufficient documentation

## 2020-05-03 DIAGNOSIS — Z95828 Presence of other vascular implants and grafts: Secondary | ICD-10-CM | POA: Diagnosis not present

## 2020-05-03 DIAGNOSIS — R269 Unspecified abnormalities of gait and mobility: Secondary | ICD-10-CM | POA: Diagnosis not present

## 2020-05-03 DIAGNOSIS — Z3169 Encounter for other general counseling and advice on procreation: Secondary | ICD-10-CM | POA: Insufficient documentation

## 2020-05-03 DIAGNOSIS — Z79899 Other long term (current) drug therapy: Secondary | ICD-10-CM | POA: Insufficient documentation

## 2020-05-03 DIAGNOSIS — Z993 Dependence on wheelchair: Secondary | ICD-10-CM | POA: Diagnosis not present

## 2020-05-03 DIAGNOSIS — O99350 Diseases of the nervous system complicating pregnancy, unspecified trimester: Secondary | ICD-10-CM

## 2020-05-03 DIAGNOSIS — O24019 Pre-existing diabetes mellitus, type 1, in pregnancy, unspecified trimester: Secondary | ICD-10-CM

## 2020-05-03 DIAGNOSIS — Z975 Presence of (intrauterine) contraceptive device: Secondary | ICD-10-CM | POA: Insufficient documentation

## 2020-05-03 DIAGNOSIS — E079 Disorder of thyroid, unspecified: Secondary | ICD-10-CM

## 2020-05-03 DIAGNOSIS — Z8759 Personal history of other complications of pregnancy, childbirth and the puerperium: Secondary | ICD-10-CM

## 2020-05-03 NOTE — Progress Notes (Signed)
Here for MD consult- preconception. BP 132/88 P 107

## 2020-05-04 ENCOUNTER — Encounter (INDEPENDENT_AMBULATORY_CARE_PROVIDER_SITE_OTHER): Payer: Self-pay | Admitting: Pediatrics

## 2020-05-04 NOTE — Telephone Encounter (Signed)
Spoke with Jessica Kelley about her phone message. She states that she needs a letter of medical necessity to go with her DMV forms

## 2020-05-04 NOTE — Telephone Encounter (Signed)
Patient called back requesting status of paperwork. Call back number is [phone].

## 2020-05-04 NOTE — Telephone Encounter (Signed)
Letter has been written, signed, and will be faxed to [facility].  If a copy is needed, we can make it available.  Please contact the patient.

## 2020-05-07 NOTE — Progress Notes (Signed)
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  She is currently being treated with Lantus insulin 32 units daily and NovoLog insulin of which the dosage is based on the number of carbs that she eats.  She is considering being placed on an insulin pump soon.  She reports that her most recent hemoglobin A1c level was about 7.7%.  She is monitoring her fingersticks 5 times daily.  She denies having any long-term sequelae related to her diabetes.  For hyperthyroidism, she is currently treated with methimazole 5 mg daily and lisinopril 2.5 mg daily.  She reports that her thyroid function tests have all been within normal limits.  She reports that due to CIDP, she experiences leg weakness and cannot move one of her ankles.  She does not have any loss of sensation in any of her extremities.  She uses a walker and a wheelchair to help her ambulate.  For treatment of CIDP, she receives twice weekly home IVIG therapy and weekly IV Solu-Medrol infusions.   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  She reports that this condition was treated with a shunt placement in her left eye.  She has not experienced any further issues since the shunt placement.  Her past surgical history includes a tendon correction in her left foot in 2010 and a port placement (for infusions) and removal in 2000-2004.  Her current medications include methimazole 5 mg daily, lisinopril 2.5 mg daily, Lantus and NovoLog insulin.  She currently has a Mirena IUD for birth control.  She works as a Freight forwarder at [employer] in [place].  As she is thinking about conceiving a pregnancy in the  near future, she was seen in consultation to discuss the management of her future pregnancy and also to discuss if a pregnancy is feasible.  During our consultation today, the following were discussed:  Pregestational diabetes and pregnancy  The implications and management of diabetes in pregnancy was discussed in detail with the patient. She was reassured that diabetes is a common condition that can be managed in pregnancy.  As she is planning to conceive a pregnancy in the near future, she was advised that she should try to blood get her glucose levels as close to the normal range as possible and to try to achieve a hemoglobin A1c value of between 6% to 7% or less, as this would provide her with the most optimal obstetrical outcomes.  She was advised to work with her endocrinologist and be placed on a daily regimen of short and long-acting insulin to try to get her blood sugars under better control. An insulin pump may also be used during pregnancy to help with glycemic control.   The increased risk of congenital birth defects such as spina bifida and cardiac defects associated with diabetes in pregnancy was discussed.  She should start taking a prenatal vitamin and extra folic acid during the preconception period to decrease her risk of having a fetus with neural tube defects.  Once she is pregnant, our goals for her fingerstick/blood glucose values are fasting values of 90-95 or less and two-hour postprandials of 120 or less.  Should her glucose values be above these  values, her insulin dosage may have to be adjusted to help her achieve better glycemic control.  The patient should work closely with her endocrinologist throughout her future pregnancy to help her obtain the most optimal glycemic control.  Once conceives, she should have a hemoglobin A1c drawn early in her pregnancy.  She should also have a baseline 24-hour urine collected to assess for total protein.  Her BUN and creatinine levels  should also be assessed to determine her renal function.  She should also have an EKG performed to assess for any cardiac conditions.  As women with diabetes are at increased risk for developing preeclampsia, she should start a daily baby aspirin (81 mg daily) at around 12 weeks of her future pregnancy for preeclampsia prophylaxis.  The patient was advised that due to diabetes in pregnancy, we will continue to follow her with serial ultrasound exams.  She should have a first trimester ultrasound to establish her due date.  She should be offered a cell free DNA test for fetal aneuploidy screening.  An alpha-fetoprotein test may be ordered at around 16 weeks to screen for spina bifida.  A fetal anatomy scan should be scheduled for her in our office at around 19 weeks.  She should be referred for a fetal echocardiogram at around 21 to 22 weeks.  She should then be followed with serial growth ultrasounds every 4 to 5 weeks. Weekly fetal testing should be started at around 32 weeks.  The patient was advised that should the fetal growth appear normal and she maintains normal glucose control, that delivery at around 39 weeks is usually recommended.  Delivery at 37 weeks may be considered should her glycemic control be poor.    Hyperthyroidism and pregnancy  Uncontrolled hyperthyroidism may be associated with an increased risk of adverse pregnancy outcomes.  She was advised that proper treatment of hyperthyroidism in pregnancy is important to avoid pregnancy complications.  Either propylthiouracil (PTU) or methimazole may be used in pregnancy to treat hyperthyroidism.  Due to the increased risk of rare congenital anomalies (esophageal atresia and aplasia cutis) associated with methimazole treatment in the first trimester, PTU is generally prescribed to control hyperthyroidism in the first trimester.  After the first trimester, either methimazole or PTU may be used for treatment of hyperthyroidism in pregnancy.  As the  patient reports that she is also being treated with lisinopril for hyperthyroidism, she was advised that lisinopril is contraindicated in pregnancy due to the potential teratogenic effects.  Her lisinopril should be discontinued prior to conceiving her future pregnancy.  She should work with her endocrinologist to find the most appropriate medication for treatment of hyperthyroidism.    Should she experience palpitations related to hyperthyroidism, a beta-blocker such as propranolol may be used in pregnancy to control her symptoms.  Once she is euthyroid, her thyroid function tests should be monitored at least once every trimester to ensure that she does not need any further changes in her medication dosage.   Chronic inflammatory demyelinating polyradiculopathy (CIDP) Chronic inflammatory demyelinating polyradiculopathy (CIDP) is an acquired polyneuropathy characterized by a progressive weakness and impaired sensory function in the legs and the arms.  It is caused by damage to the myelin sheath of the peripheral nerves.  There have been a handful of cases of CIDP and pregnancy reported in the literature.  Treatments for CIDP in pregnancy include corticosteroids, immunoglobulin (IVIG), and plasma exchange.  All three modes of treatment are considered to be safe in pregnancy.  As the  patient is currently treated with twice weekly IVIG infusions along with weekly IV Solu-Medrol, she may continue this treatment during her future pregnancy.  Her pregnancy should be co-managed with her neurologist.  As her symptoms related to CIDP are primarily weakness of the lower extremities, I do not anticipate that there should be any major issues related to her pregnancy.  As the patient also reports a history of scoliosis, she should have an anesthesia consult prior to delivery to discuss her eligibility for regional anesthesia.  As she reports hip weakness and possibly the inability to push, the patient has stated that  she would prefer to have a cesarean delivery. I believe that a cesarean delivery may be reasonable given her medical condition.  To decrease her risk of preeclampsia and possibly for DVT prophylaxis, she should start taking a daily baby aspirin at around 12 weeks of her future pregnancy.    As she primarily uses a wheelchair, she should probably be placed on prophylactic Lovenox (40 mg daily) for 6 weeks postpartum, especially if she has a cesarean delivery.  I do not believe that prophylactic Lovenox is necessary prior to delivery unless she remains immobile.  The patient was reassured that based on my assessment, I do not believe that her underlying medical conditions would preclude her from carrying a pregnancy should she desire to have a child.  Therefore, she may conceive a pregnancy should she desire.  We look forward to following her closely with you throughout her future pregnancy.     At the end of the consultation, the patient stated that all her questions had been answered to her complete satisfaction.    Thank you for referring this patient for a Maternal-Fetal Medicine preconception consultation.  A total of 60 minutes was spent working on this consultation.    This consultation was copied from an original document.generated using [employer].

## 2020-05-08 NOTE — Telephone Encounter (Signed)
Patient has called back again asking for a status update. States that DMV needs paperwork by the 23rd. Her call back number is [phone].

## 2020-05-08 NOTE — Telephone Encounter (Signed)
Spoke with patient to inform her that the letter was faxed to the [facility] on Friday

## 2020-05-10 ENCOUNTER — Other Ambulatory Visit (INDEPENDENT_AMBULATORY_CARE_PROVIDER_SITE_OTHER): Payer: Self-pay | Admitting: "Endocrinology

## 2020-05-10 ENCOUNTER — Ambulatory Visit (INDEPENDENT_AMBULATORY_CARE_PROVIDER_SITE_OTHER): Payer: BC Managed Care – PPO | Admitting: Pediatrics

## 2020-05-24 ENCOUNTER — Ambulatory Visit (INDEPENDENT_AMBULATORY_CARE_PROVIDER_SITE_OTHER): Payer: BC Managed Care – PPO | Admitting: "Endocrinology

## 2020-05-24 ENCOUNTER — Ambulatory Visit (INDEPENDENT_AMBULATORY_CARE_PROVIDER_SITE_OTHER): Payer: BC Managed Care – PPO | Admitting: Pediatrics

## 2020-06-02 ENCOUNTER — Other Ambulatory Visit: Payer: Self-pay

## 2020-06-02 ENCOUNTER — Encounter (INDEPENDENT_AMBULATORY_CARE_PROVIDER_SITE_OTHER): Payer: Self-pay | Admitting: Pediatrics

## 2020-06-02 ENCOUNTER — Ambulatory Visit (INDEPENDENT_AMBULATORY_CARE_PROVIDER_SITE_OTHER): Payer: BC Managed Care – PPO | Admitting: Pediatrics

## 2020-06-02 ENCOUNTER — Telehealth (INDEPENDENT_AMBULATORY_CARE_PROVIDER_SITE_OTHER): Payer: Self-pay | Admitting: Pediatrics

## 2020-06-02 VITALS — BP 140/100 | HR 96 | Ht 63.5 in | Wt 139.0 lb

## 2020-06-02 DIAGNOSIS — G43009 Migraine without aura, not intractable, without status migrainosus: Secondary | ICD-10-CM

## 2020-06-02 DIAGNOSIS — G6181 Chronic inflammatory demyelinating polyneuritis: Secondary | ICD-10-CM

## 2020-06-02 DIAGNOSIS — R269 Unspecified abnormalities of gait and mobility: Secondary | ICD-10-CM | POA: Diagnosis not present

## 2020-06-02 NOTE — Patient Instructions (Signed)
It was a pleasure to see you today.  I am glad that you are medically and neurologically stable.  Your immune therapy has created your medical stability and medically necessary to continue it.  We will take care of any issues related to continuing medication next week.  I like to see you again in 6 months.  We will see you sooner based on clinical need.  After that visit, care will be transferred to Dr. Gustavus Bryant at [facility]

## 2020-06-02 NOTE — Telephone Encounter (Signed)
I called Jessica Kelley and asked her to come in a 2:30 PM today because we have to see her to request prior authorization for her Gamimmune.

## 2020-06-02 NOTE — Progress Notes (Signed)
Patient: Jessica Kelley MRN: [id number] Sex: female DOB: 30-Nov-1990  Provider: Wyline Copas, MD Location of Care: [facility]rology  Note type: Routine return visit  History of Present Illness: Referral Source: Garnet Koyanagi, DO History from: spouse, patient and [facility] chart Chief Complaint: Chronic inflammatory demyelinating polyneuritis  Jessica Kelley is a 30 y.o. female who was evaluated June 02, 2020 for the first time since January 05, 2020.  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  She was followed by [facility] for a number of years and responded to immunotherapy with IVIG and Solu-Medrol which stabilized her strength.  She has been on a number of other immunosuppressant medications.  She was seen by Dr. Gustavus Bryant at [facility].  His impressions are in the past medical history.  She suffered a closed head injury January 31, 2019 causing a left occipital skull fracture and subdural hematoma.  Fortunately she has not had any further falls.  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  She had iridectomy and surgery for glaucoma.  Is not possible to correct her vision.  She has regular ophthalmologic evaluation.  She is followed by Dr. 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  She has been medically stable over the last 5 months.  In part this evaluation was performed to affirm that she has CIDP and that Gamimune which is a form of IVIG is medically necessary for her treatment.  As best I can determine there is been no deterioration in her strength.  She uses a wheelchair at work and at home but told me that she is walking at home.  I strongly advised her to do that because if she stops walking she will  lose the ability to do so.  We carried out a face-to-face to get a new wheelchair and fortunately he was in the office today.  She had a mild case of COVID as did her husband in January 2021.  He works in a prison.  Because of her underlying immune disorder, she is unable to receive any of the coronavirus vaccines.  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  She is married.  Her husband was at the office visit today.  Review of Systems: A complete review of systems was remarkable for patient is here to be seen for a follow up. She reports that she has been doing well. She states that she has no concerns for today's visit, all other systems reviewed and negative.  Past Medical History Diagnosis Date  . Brain bleed [facility])    Patient stated  . Celiac disease   . CIDP (chronic inflammatory demyelinating polyneuropathy) ([place]) 2001  . Diabetes type I ([place])    Dr. Tobe Sos  . Glaucoma   . Hypothyroidism   . Macular degeneration   . Pilonidal cyst   . Scoliosis   . Wyburn-Mason syndrome    Hospitalizations: No., Head Injury: No., Nervous System Infections: No., Immunizations up to date: Yes.    Copied from prior chart notes Detailed information from previous evaluations at [facility] are in the media sectionand from [place] and [facility] in [place].  CT scan of the brain May 03, 2012 was negative  MRI of the pelvis at [facility] October 25, 2014 showed stable thickening and increased T2  hyperintensity within the exiting nerve roots and lumbosacral plexus and sciatic nerves. Thickening and increased T2 intensity of the left lateral femoral cutaneous and intrapelvic femoral nerves was less conspicuous.  Dr. Gustavus Bryant, [facility] neurology has seen her. There was an initial consultation for this condition on March 31, 2018. 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 Despite this Dr. 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   She had a closed head injury January 31, 2019 when she lost balance and fell striking her head causing a left occipital skull fracture and subdural hematoma. MRI scan February 07, 2019 showed a 7 mm right cerebral convexity hematoma that had increased from 4 to 5 mm. She also had some parenchymal hematomas in the right greater than left gyri recti with surrounding vasogenic edema, extra-axial include collections in the floor of the anterior cranial fossa anterior aspect of the right temporal lobe and posterior margin of the clivus, and left sphenoid sinus. She was out of work for 2 months. 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  Birth History 7 pound 9 ounce infant born at full-term.  Gestation was uncomplicated.  Labor lasted 23 hours.  Operative delivery with forceps.  Nursery course was uneventful.  Growth and development is recalled as normal.  Behavior History none  Surgical History Procedure Laterality Date  . CATARACT EXTRACTION  06/25/06  . FOOT ARTHROPLASTY     left foot moved and stretched tendons   . GLAUCOMA SURGERY  02/12/06  . HIP ARTHROPLASTY     right never bipopsy  . PORTACATH PLACEMENT  2002-2004  . removal of vascular port    . Sciatic Nerve Muscle Biopsy     Family History family history includes Cancer in her maternal grandfather and paternal grandmother; Coronary artery disease in her paternal grandfather; Diabetes in her sister; Hyperlipidemia in an other family member; Other in her sister; Prostate cancer in her maternal grandfather; Thyroid disease in her mother. Family history is negative for migraines, seizures, intellectual disabilities, blindness, deafness, birth defects, chromosomal  disorder, or autism.  Social History Socioeconomic History  . Marital status:  Married    Spouse name:  Constanza Mincy  . Number of children:  None  . Years of education:  2  . Highest education level:  High school graduate  Occupational History  .  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 Smoking status: Never Smoker  . Smokeless tobacco: Never Used  . Tobacco comment: passive smoke exposure  Vaping Use  . Vaping Use: Never used  Substance and Sexual Activity  . Alcohol use: Yes    Alcohol/week: 0.0 standard drinks    Comment: OCC  . Drug use: No  . Sexual activity: Not on file  Social History Narrative    Adna is a 30 yo woman     She is employed with [employer].    She lives with her husband and has one sister, 26 yo.    She enjoys working, camping, and walking outside.   Allergies Allergen Reactions  . Gluten Meal   . Wheat Bran   . Ginger Rash   Physical Exam BP (!) 140/100   Pulse 96   Ht 5' 3.5" (1.613  m)   Wt 139 lb (63 kg)   BMI 24.24 kg/m   General: alert, well developed, well nourished, in no acute distress, CAD with red highlights hair, blue eyes, left handed Head: normocephalic, no dysmorphic features Ears, Nose and Throat: Otoscopic: tympanic membranes normal; pharynx: oropharynx is pink without exudates or tonsillar hypertrophy Neck: supple, full range of motion, no cranial or cervical bruits Respiratory: auscultation clear Cardiovascular: no murmurs, pulses are normal Musculoskeletal: Muscular wasting in her legs right greater than left or apparent scoliosis Skin: no rashes or neurocutaneous lesions  Neurologic Exam  Mental Status: alert; oriented to person, place and year; knowledge is normal for age; language is normal Cranial Nerves: visual fields are full to double simultaneous stimuli; extraocular movements are full and conjugate; pupils are round reactive to light on the right, iridectomy with afferent  pupillary defect on the left; funduscopic examination shows sharp disc margins with normal vessels on the right and dilated vessels with an abnormal disc on the left; symmetric facial strength; midline tongue and uvula; air conduction is greater than bone conduction bilaterally Motor: Sternocleidomastoid, trapezius, neck flexors and extensors: 5/5; deltoid: Right 4+, left 4, biceps: Right 5, left 4+, triceps: Right 5, left 4+; supraspinatus right: 4, left: 4 -; pronator teres 4+; supinator: 4, pectoralis: Right 5, left 4+; wrist extensors and flexors 5; opponens: Right 4, left 4+; distal digital flexors: Right 4+, left 5 ;Hip abductors: Right 2, left 4, hip abductors: Right 1, left 5; knee flexors: Right 2, left 4+, knee extensors: Right 2, left 5; foot dorsiflexors: Right 0, left 4; foot plantar flexors: Right 0, left 5, toe movement: Right 0, left 3 Sensory: intact responses to cold, vibration, proprioception and stereognosis Coordination: good finger-to-nose, rapid repetitive alternating movements and finger apposition Gait and Station: not tested Reflexes: absent bilaterally; no clonus; bilateral neutral plantar responses  Assessment 1.  Demyelinating polyneuritis, chronic inflammatory, G61.81.  Discussion Santana is medically and neurologically stable.  IVIG is medically necessary to preserve her stability.  Plan Continue her current treatment of IVIG corticosteroids as prescribed.  Return visit to see me in 6 months time.  After that I will transfer care to Dr. Tillman Abide.  Greater than 50% of a 30-minute visit was spent in counseling and coordination of care discussing transition of care.   Medication List   Accurate as of June 02, 2020  4:09 PM. If you have any questions, ask your nurse or doctor.      TAKE these medications   immune globulin (human) 10 g injection Commonly known as: GAMMAGARD S/D LESS IGA Inject into the vein.   Lantus SoloStar 100 UNIT/ML Solostar Pen Generic  drug: insulin glargine INJECT UP TO 50 UNITS SUBCUTANEOUSLY ONCE DAILY   lisinopril 2.5 MG tablet Commonly known as: ZESTRIL Take 1 tablet by mouth once daily   methimazole 5 MG tablet Commonly known as: TAPAZOLE TAKE 3 TABLETS BY MOUTH TWICE DAILY   methylPREDNISolone sodium succinate 500 MG injection Commonly known as: SOLU-MEDROL Give 538m IV in 50 ml NS over 1 hour x 1 every 4 weeks. Given by [facility]   multivitamin with minerals tablet Take by mouth.   NovoLOG FlexPen 100 UNIT/ML FlexPen Generic drug: insulin aspart INJECT UP TO 50 UNITS SUBCUTANEOUSLY ONCE DAILY   propranolol 10 MG tablet Commonly known as: INDERAL Take 1/2 to 1 tablet daily.   VITAMIN B-12 PO Take by mouth.   Xiidra 5 % Soln Generic drug: Lifitegrast    The  medication list was reviewed and reconciled. All changes or newly prescribed medications were explained.  A complete medication list was provided to the patient/caregiver.  Jodi Geralds MD

## 2020-06-12 ENCOUNTER — Telehealth (INDEPENDENT_AMBULATORY_CARE_PROVIDER_SITE_OTHER): Payer: Self-pay | Admitting: Pediatrics

## 2020-06-12 NOTE — Telephone Encounter (Signed)
I called and talked with Jessica Kelley. She said that she had no symptoms but that she was tested for work and was positive. I told her that being positive for Covid should not keep her from receiving the IVIG treatment. She had no further questions. TG

## 2020-06-12 NOTE — Telephone Encounter (Signed)
  Who's calling (name and relationship to patient) : Arryanna (self)  Best contact number: [phone]  Provider they see: Dr. Gaynell Face  Reason for call: Patient was just diagnosed with Covid and wants to know if she can proceed with her IV IG treatment as usual.    PRESCRIPTION REFILL ONLY  Name of prescription:  Pharmacy:

## 2020-06-16 ENCOUNTER — Telehealth (INDEPENDENT_AMBULATORY_CARE_PROVIDER_SITE_OTHER): Payer: Self-pay | Admitting: "Endocrinology

## 2020-06-16 DIAGNOSIS — E063 Autoimmune thyroiditis: Secondary | ICD-10-CM

## 2020-06-16 NOTE — Telephone Encounter (Signed)
Returned call to patient, let her know the labs had been placed by Dr. Tobe Sos already.  She stated she thinks her medication may need to be adjusted.  She asked about adding on another test, I told her if it wasn't in his note I would need to ask the on call provider, she stated that wasn't necessary.  I did suggest that she could call her primary and have it sent to a [facility] lab if she wanted to try that route as well.  She said thank you and that she will be here Monday to get her labs drawn.

## 2020-06-16 NOTE — Telephone Encounter (Signed)
Who's calling (name and relationship to patient) : Tondalaya Perren   Best contact number: [phone]  Provider they see: Dr. Tobe Sos  Reason for call: Patient states that she needs to get thyroid blood work done. She is requesting orders be placed. She thinks she may need to adjust medications. Please call to discuss.   Call ID:      PRESCRIPTION REFILL ONLY  Name of prescription:  Pharmacy:

## 2020-06-20 ENCOUNTER — Telehealth: Payer: Self-pay | Admitting: "Endocrinology

## 2020-06-20 DIAGNOSIS — E1065 Type 1 diabetes mellitus with hyperglycemia: Secondary | ICD-10-CM

## 2020-06-20 DIAGNOSIS — E0500 Thyrotoxicosis with diffuse goiter without thyrotoxic crisis or storm: Secondary | ICD-10-CM

## 2020-06-20 NOTE — Telephone Encounter (Signed)
Patient needs to switch two of her medications to be safe options for fetal care. Please call to discuss. She has had testing done recently and would like to go over that information as well [phone]

## 2020-06-20 NOTE — Telephone Encounter (Signed)
I have sent this to Dr Tobe Sos. She only had her TSH labs drawn. I added the CMP and T4Free and T3 Free today. The lab tech said it could take up to 2 days for them to result.  Dr Tobe Sos is aware. He said that he would look at this after clinic.

## 2020-06-20 NOTE — Telephone Encounter (Signed)
1. Jessica Kelley call earlier and asked me to cal her.  2. She has recently had a positive pregnancy test. 3. In preparation for pregnancy, she has been taking superb care of her T1DM and her BGs are very good.  3. She saw her fetal medicine physician, who suggested that she switch from methimazole to PTU, but deferred to me. The fetal medicine doctor also told her that he will discontinue lisinopril and switch her to something else. 4. I told her that I agree with converting her from MTZ to PTU. I also agreed with discontinuing her lisinopril and starting her on another antihypertensive. 5. She had TFTs, a CMP, and urinary microalbumin/creatinine ratio drawn yesterday. She now needs a TSI and a CBC. She will come in Friday to have those tests done. Once we have those results we can convert her to PTU.    6. She has an appointment to see me in March.  Tillman Sers, MD, CDE

## 2020-06-20 NOTE — Telephone Encounter (Signed)
Patient is scared of the medication she is taking while being pregnant.

## 2020-06-20 NOTE — Telephone Encounter (Signed)
The lab tech was able to add on the remaining labs that needed to be drawn.   Patient states that she had 1 positive pregnancy test.

## 2020-06-21 ENCOUNTER — Encounter (INDEPENDENT_AMBULATORY_CARE_PROVIDER_SITE_OTHER): Payer: Self-pay

## 2020-06-21 LAB — T4, FREE: Free T4: 1.3 ng/dL (ref 0.8–1.8)

## 2020-06-21 LAB — COMPREHENSIVE METABOLIC PANEL
AG Ratio: 0.7 (calc) — ABNORMAL LOW (ref 1.0–2.5)
ALT: 24 U/L (ref 6–29)
AST: 41 U/L — ABNORMAL HIGH (ref 10–30)
Albumin: 3.5 g/dL — ABNORMAL LOW (ref 3.6–5.1)
Alkaline phosphatase (APISO): 106 U/L (ref 31–125)
BUN: 22 mg/dL (ref 7–25)
CO2: 22 mmol/L (ref 20–32)
Calcium: 9.5 mg/dL (ref 8.6–10.2)
Chloride: 102 mmol/L (ref 98–110)
Creat: 0.66 mg/dL (ref 0.50–1.10)
Globulin: 5.2 g/dL (calc) — ABNORMAL HIGH (ref 1.9–3.7)
Glucose, Bld: 94 mg/dL (ref 65–139)
Potassium: 3.5 mmol/L (ref 3.5–5.3)
Sodium: 138 mmol/L (ref 135–146)
Total Bilirubin: 0.2 mg/dL (ref 0.2–1.2)
Total Protein: 8.7 g/dL — ABNORMAL HIGH (ref 6.1–8.1)

## 2020-06-21 LAB — TEST AUTHORIZATION

## 2020-06-21 LAB — T3, FREE: T3, Free: 3.8 pg/mL (ref 2.3–4.2)

## 2020-06-21 LAB — TSH: TSH: 1.6 mIU/L

## 2020-06-26 ENCOUNTER — Other Ambulatory Visit (INDEPENDENT_AMBULATORY_CARE_PROVIDER_SITE_OTHER): Payer: Self-pay

## 2020-06-26 DIAGNOSIS — R5383 Other fatigue: Secondary | ICD-10-CM

## 2020-06-26 DIAGNOSIS — R140 Abdominal distension (gaseous): Secondary | ICD-10-CM

## 2020-06-26 LAB — COMPREHENSIVE METABOLIC PANEL
AG Ratio: 0.5 (calc) — ABNORMAL LOW (ref 1.0–2.5)
ALT: 30 U/L — ABNORMAL HIGH (ref 6–29)
AST: 37 U/L — ABNORMAL HIGH (ref 10–30)
Albumin: 3.3 g/dL — ABNORMAL LOW (ref 3.6–5.1)
Alkaline phosphatase (APISO): 86 U/L (ref 31–125)
BUN: 16 mg/dL (ref 7–25)
CO2: 29 mmol/L (ref 20–32)
Calcium: 9.2 mg/dL (ref 8.6–10.2)
Chloride: 95 mmol/L — ABNORMAL LOW (ref 98–110)
Creat: 0.66 mg/dL (ref 0.50–1.10)
Globulin: 6.3 g/dL (calc) — ABNORMAL HIGH (ref 1.9–3.7)
Glucose, Bld: 284 mg/dL — ABNORMAL HIGH (ref 65–139)
Potassium: 4.6 mmol/L (ref 3.5–5.3)
Sodium: 130 mmol/L — ABNORMAL LOW (ref 135–146)
Total Bilirubin: 0.4 mg/dL (ref 0.2–1.2)
Total Protein: 9.6 g/dL — ABNORMAL HIGH (ref 6.1–8.1)

## 2020-06-26 LAB — TEST AUTHORIZATION

## 2020-06-26 LAB — T4, FREE: Free T4: 1.2 ng/dL (ref 0.8–1.8)

## 2020-06-26 LAB — HCG, SERUM, QUALITATIVE: Preg, Serum: NEGATIVE

## 2020-06-26 LAB — T3, FREE: T3, Free: 3.9 pg/mL (ref 2.3–4.2)

## 2020-06-27 NOTE — Telephone Encounter (Signed)
See other notes from this date.

## 2020-07-05 ENCOUNTER — Encounter (INDEPENDENT_AMBULATORY_CARE_PROVIDER_SITE_OTHER): Payer: Self-pay | Admitting: *Deleted

## 2020-07-12 ENCOUNTER — Ambulatory Visit (INDEPENDENT_AMBULATORY_CARE_PROVIDER_SITE_OTHER): Payer: BC Managed Care – PPO | Admitting: Pediatrics

## 2020-07-14 ENCOUNTER — Other Ambulatory Visit (INDEPENDENT_AMBULATORY_CARE_PROVIDER_SITE_OTHER): Payer: Self-pay | Admitting: "Endocrinology

## 2020-07-25 ENCOUNTER — Other Ambulatory Visit (INDEPENDENT_AMBULATORY_CARE_PROVIDER_SITE_OTHER): Payer: Self-pay | Admitting: "Endocrinology

## 2020-07-30 NOTE — Progress Notes (Unsigned)
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  HISTORY OF PRESENT ILLNESS: The patient is a 30 year-old Caucasian young woman. She was unaccompanied today.   23. This 30 year-old Caucasian young woman with an already complicated medical history was first referred to me on 09/25/06 by her primary care provider, Dr. Garnet Koyanagi, for evaluation and management of type I diabetes mellitus, hypoglycemia, and a myriad of other autoimmune issues.   A. CIDP: At about age 63 the child began to limp.  At age 26 she was evaluated at [facility] and diagnosed with CIDP. At the time I first met her she was under the care of Dr. Marcene Brawn of [facility] in [place]. She was on a regular regimen of monthly intravenous treatments at home with IVIG and steroids on the days of therapy. She was also taking cyclosporine A daily, with alternating doses every other day. Because of the CIDP she had  significant problems with muscle atrophy and weakness, as well as multiple orthopedic issues. She was also being followed by Dr. Simonne Come, an orthopedist in [place]. On that first visit her legs and arms were quite weak. She found it very hard to walk or to exercise.  B. Type 1 diabetes mellitus: Her diabetes mellitus was also diagnosed at age 47, just prior to the diagnosis of CIDP. She had been started on a Medtronic Paradigm 712 insulin pump subsequently and was using Humalog lispro insulin in her pump. She had a very difficult time with blood glucose control. At the time of her steroids, her sugars tended to run very  high. However, when she was able to perform some physical activities, she tended to develop hypoglycemia. Her hemoglobin A1c on that first visit was 9.5%.  C. Celiac disease: The patient was diagnosed with celiac disease at approximately age 50. She had been on a gluten-free diet ever since.  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 She was followed both at [facility] and at the [facility] eye clinic.  2. Since that initial visit with me, I have followed her for the problems listed above plus other new problems that have developed over time.   A. CIDP: Her CIDP course has waxed and waned. At one point she had improved significantly, her medication doses had been reduced, she felt very much stronger, had more stamina, and was doing very well. Unfortunately, she later had a significant relapse of CIDP and lost much of the improvement that she had gained. Since going back to [facility] in the Spring of 2013, however, and starting a new and more intensified IVIG regimen, her strength had improved. She was still very weak physically and muscularly, however, and needed support to walk. As noted below, she gets stronger when her steroids are increased and weaker when the steroids are tapered. In the past  years Dr. Wyline Copas, MD, our senior pediatric neurologist, had assumed the role of managing her CIDP. More recently, however, she has been seeing Dr. Gustavus Bryant at [facility].  B. Type 1 diabetes mellitus: The patient's blood sugars have varied significantly with the course of her CIDP and the treatments  for that disease. Her hemoglobin A1c values have ranged from a low of 8.3% to a high of 14.0%. Most of her A1c values have been in the 8.4-10.1% range.   C. Celiac disease: The patient has really done quite well over the years. She rarely has had signs or symptoms of  abdominal problems. Because her antibody levels fluctuated a great deal, her celiac disease antibodies were negative in March 2012. She was mistakenly told that she did not have celiac disease and that she could resume a normal diet. As expected, resumption of a normal diet caused a recurrence of active celiac disease. In October 2012 she saw a gastroenterologist, Dr. Gale Journey, at [facility] who told her that she did have celiac disease. She is now back on her gluten-free diet almost all the time and is asymptomatic again.  D. Autoimmune thyroid disease: The patient had a goiter and a TPO antibody level of 287.2 on her first visit in May of 2008, consistent with evolving Hashimoto's Disease. She occasionally had the sensations of swelling and discomfort in her anterior neck, which were also c/w flare-ups of Hashimoto's Disease. In early 2009 she developed increased tachycardia. Lab tests performed in March 2010 by her primary care provider showed a TSH of 0.01 and a T4 of 7.7. It was initially felt that the increase in tachycardia was likely due to a combination of autonomic neuropathy and Hashitoxicosis.  However on follow-up laboratory tests performed in July, her TSH was 0.05, free T4 3.9, and free T3 14.2. Her TSI level done on the assay used at the time was 2.2, with normal being 1.0 or less. It was then evident that she had developed Graves' Disease in the setting of pre-existing Hashimoto's Disease.  I initiated treatment with methimazole, 10 mg twice daily and propranolol 20 mg 3 times daily. Since then we've seen significant swings of her thyroid function tests, partly due to changes in methimazole doses, but also partly due to changes in her immunosuppressant therapies. Her TSH values have ranged from a low of 0.01 to a high of 14.933. The dose of methimazole has been adjusted as needed to treat her Graves' disease. It has been my hope that her Hashimoto's disease will eventually cancel out her Graves' disease.    E. 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 She has since recovered fairly well, but her sense of smell is still absent and her new OCD tendencies persist.   F. Her husband was positive for covid-19 on 05/24/19. She was positive as well. She has since recovered.  3.The patient's last [facility] visit was on 01/17/20. At that visit I increased her Lantus dose to 36 units and continued her current Novolog plan, but add two units at each meal. I continued her MTZ dose of 10 mg, once daily. She then had more low BGs, so she reduced the Lantus dose to 30 units. She also reduces her Novolog plus up at meals at times based upon her BG values and what is going on in her life at the time.   A. In the interim she has been generally healthy.  B. She has been taking the Lantus dose of 30 units and following her Novolog plan. She has been taking the MTZ doses of 10 mg, once daily.   C. Her BP has been stable. She continues the lisinopril dose of 2.5 mg twice daily.  D. She has not been having any hyperthyroid symptoms or hypothyroid symptoms for  months. Her energy level is "good'. She is not jittery or shaky. She is sleeping well, but still occasionally has insomnia.   E. She continues to have frequent and annoying OCD tendencies since her brain injury, for example, checking the door locks 11 times per day, making sure the stove is off multiple times, and making sure all the electrical appliances are plugged in correctly.   F. Her CIDP is about the same. She saw Dr. Edison Nasuti at the  [facility] in June 2018. No changes in her CDIP treatment plan were made. She remains on her regimen of IVIG and iv steroids. She continues to have IVIG every Monday and Thursday. She receives iv steroids on Monday. Her steroid dose has not been changed since her last visit. better. She uses a wheel chair at work, but uses her walker to walk 1-1/2 miles per day. Her headaches have not recurred for several months.  She converted to seeing Dr. Gustavus Bryant at the [facility] for her CIDP management. Her CIDP regimen is unchanged. She saw Dr Gaynell Face in January 2022 for her annual exam.    G. Her BGs have continued to be variable, with some spikes and some lows. She sometimes has nocturnal low BGs.   H. She is also still taking 5 mg propranolol, vitamin D, iron, and a vitamin B complex pill. She remains on her gluten-free diet.  I. She continues on her MDI regimen. She still takes 30 units of Lantus each evening. She takes Novolog according to our 150/50/5 plan, with plus ups of 1-2 units at meals.. She is trying harder to be more consistent in her DM self-management.  J. She is not anxious now.   K. She lives with her husband. He now works the day shift 5 days per week.  L. She remains on a lower carb diet.  M. She is not trying to get pregnant now.   4. Pertinent Review of Systems: Constitutional: Tamre says she feels "pretty good" today. She has a good energy level. Her appetite is unchanged. She drinks about 12-24 ounces of caffeinated drinks per day. Her strength and stamina have been pretty good in the past months. When she uses her wheel chair at work, she is less tired and has better stamina.  Eyes: Her contact lenses are helping. Last eye exam was in July 2021. She did not have signs of DM.  Neck: The patient has not had any problems with anterior neck swelling and tenderness for more than a year.   Heart: Her heart rate has been normal. She has no complaints of palpitations, irregular heart beats, chest pain, or chest pressure.   Gastrointestinal:  Most of her GI problems resolved after she stopped eating pork products. She has "a little bit of bloating at times".Bowel movents seem normal. The patient has no complaints of excessive hunger, acid reflux, diarrhea, or constipation.  Legs: Muscle mass and strength seem to be "about the same". There are few complaints of numbness, tingling, burning, or  pain. No edema is noted.  Feet: There are no obvious new foot problems. There are no complaints of numbness, tingling, burning, or pain. No edema is noted. Neurologic: She still requires the use of her walker or wheel chair. Muscle weakness is still about the same. Sensation seems to be normal. Coordination is about the same. GYN: Her LMP was this past week. Her IUD was removed. Her last two periods occurred monthly.   Scalp: She is no longer losing hair.  Hypoglycemia: She has had several low BGs during the night and some at work.     5. Blood glucose review: We have data from the past two weeks. She checks BGs 2-5 times daily, average 4.1 times per day. Average BG was 255, compared with 230 at her last visit and with 251 at her prior visit. Her BG range was 45-443, compared with 75->400 at her last visit and  with 86->400 at her prior visit. Her highest BGS occurred after forgetting to bolus or after treatment of low BGs. She had four BGs in the range 48-58.     PAST MEDICAL, FAMILY, AND SOCIAL HISTORY: 1. 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 She was married in May 2020. She will be changing her last name to Beaver soon. Her husband now works the day shift.  2. Activities: She has been walking more.  3. Smoking, alcohol, or drugs: None 4. Primary Care Provider: Dr. Garnet Koyanagi at [facility]. 5. Neurologist: Dr. Gustavus Bryant, [facility] every 6 months and Dr. Gaynell Face for annual visits.  REVIEW OF SYSTEMS: There are no other significant problems involving the patient's other body systems.  PHYSICAL EXAM: BP 116/78   Pulse (!) 115   Wt 137 lb (62.1 kg)   BMI 23.89 kg/m   Wt Readings from Last 3 Encounters:  07/31/20 137 lb (62.1 kg)  06/02/20 139 lb (63 kg)  03/14/20 145 lb (65.8 kg)    Ht Readings from Last 3 Encounters:  06/02/20 5' 3.5" (1.613 m)  03/14/20 5' 3.5" (1.613 m)  01/05/20 5' 3.5" (1.613 m)    HC Readings from Last 3  Encounters:  No data found for [facility]   Facility age limit for growth percentiles is 20 years.  Body mass index is 23.89 kg/m. Facility age limit for growth percentiles is 20 years.  Body surface area is 1.67 meters squared.  Constitutional: This patient is in her wheelchair today. She appears healthy and looks pretty good. She has lost 3 pounds in 7 months. Her affect and insight are quite good today.  Head: The head is normocephalic. Face: The face appears normal. There are no obvious dysmorphic features. Eyes: The eyes appear to be normally formed and spaced. Gaze is conjugate. There is no obvious arcus or proptosis. Moisture appears normal. Mouth: The oropharynx and tongue appear normal. Dentition appears to be normal for age. Oral moisture is normal. She has no tongue tremor.  Neck: On inspection the thyroid gland is again visibly enlarged. The gland is larger at about 21-22 grams in size. Today the lobes are symmetrically enlarged. The thyroid gland is softer today and is not tender to palpation. Lungs: The lungs are clear to auscultation. Air movement is good. Heart: Heart rate is very fast. Heart rhythm is regular. Heart sounds S1 and S2 are normal. I do not hear her previous grade 2/6 SEM flow murmur due to her Graves' disease.   Abdomen: The abdomen is larger in size today. Bowel sounds are normal. There is no obvious hepatomegaly, splenomegaly, or other mass effect.  Arms: Muscle size and bulk are low-normal for age. Hands: There is a faint, fine tremor today. Phalangeal and metacarpophalangeal joints are normal. Palmar muscles are low-normal for age. Palmar skin shows no palmar erythema. Palmar moisture is normal.. Legs: Muscles appear small for age, especially in the right leg. No edema is present. Feet: Dorsalis pedal pulses are 1+ on the right and 1+ on  the left.  Neurologic: Strength is 4-5/5 for age in the upper extremities, 2/5 in the right leg, and 4/5 in the left leg. Muscle  tone is somewhat low. Sensation to touch is normal in both her legs and feet.   Skin: No rashes Scalp: Overall the scalp looks pretty good.   LAB DATA:  Labs 07/31/20: HbA1c 8.9%, CBG 226  Labs 06/23/20: free  T4 1.2, free T3 3.9; CMP normal, except glucose 284; hCG negative  Labs 06/19/20: TSH 1.60, free T4 1.3, free T3 3.8; CMP normal, except albumin 3.5 (ref 3.6-5.1), total protein 8.7 (ref 6.1-8.1), globulin 5.2 (ref 1.9-3.7), AST 41 (ref 10-30);    Labs 01/17/20; HbA1c 9.1%, CBG 245  Labs 06/23/19: HbA1c 10.3%, CBG 282; TSH 1.97, free T4 1.1, free T3 3.9; CMP abnormal, with glucose 296, sodium 131 (ref 135-146), calcium 8.3 (ref 8.6-10.2), albumin 3.4 (ref 3.6-5.1), globulin 5.8 (ref 1.9-3.7); CBC normal;  Labs 11/05/18: CBG: 192; TSH 0.03, free T4 1.6, free T3 3.9, TSI <89; CMP normal except glucose of 341, sodium 131, albumin 3.2 (ref 3.6-5.1), globulin 7.2 (ref 1.9-3.7); cholesterol 157, triglycerides 62, HDL 52, LDL 91; urinary microalbumin/creatinine ratio 40  Labs 08/19/18: HbA1c 10.3%, CBG 174  Labs 04/27/18: HbA1c 9.1%, CBG 270; TSH 0.11, free T4 1.2, free T3 3.2; CMP normal, except glucose 264, sodium 131, albumin 3.3 (ref 3.6-5.1), globulin 8.2 (ref 1.9-3.7); CBC normal, except lymphocytes 257 (ref [phone])  Labs 02/23/18: CBG 264; TSH 0.35, free T4 1.1, free T3 3.7; CMP normal except glucose of 153, total protein 11.4 (ref 6.1-8.1), albumin 3.2 (ref 3.6-5.1), globulin 8.2 (ref 1.9-3.7); amylase 42 (ref 21-101)  Labs 01/09/18: HbA1c 10.2%  Labs 08/27/17: CBG 220  Labs 06/25/17: HbA1c 9.6%, CBG 92; TSH 1.92, free T4 1.1, free T3 3.1, TSI <89; urine microalbumin/creatinine ratio 53 (ref <30); CBC normal; CMP normal, except for glucose 224, sodium 132, albumin 3.2 (ref 3.6-5.1), calcium 8.5 (ref 8.6-10.2, corrected to 9.2 for albumin level), globulin 6.1 (ref 1.9-3.7)  Labs 01/09/17: CBG 293  Labs 12/20/16: ACTH stimulation test: Cortisols 3, 18.9, 24.5; sodium 130, albumin  2.8  Labs 12/17/16: HbA1c 10.1%; TSH 1.14, free T4 1.2, free T3 2.6, TSI <89; WBC 15/8, Hgb 13.1, Hct 40/8%  Labs 07/11/16: HbA1c 11.5%, CBG 225  Labs 06/12/16: TSH 2.20, free T4 1.02, free T3 3.0; CBC normal; CMP normal except sodium 133, glucose 259, albumin 2.8, calcium 8.3  Labs 02/22/16: HbA1c 10.7%  Labs 10/18/15: HbA1c 10.8%  Labs 05/09/15: HbA1c 11.1%  Labs 04/07/15: CBC normal; TSH 1.826, free T4 1.06, free T3 2.8, TSI 156; iron 234, CMP normal except for sodium 132, glucose 159, albumin 2.9, calcium 8.7; ACTH 11, cortisol 10.5  Labs 11/08/14: HbA1c 10.9%; TSH 2.234, free T4 0.87, free T3 2.7, TSI pending  Labs 06/01/14: HbA1c is 9.7% today, compared with 11.5% at last visit. Other labs are pending.  Labs 02/17/14: TSH 2.125, free T4 0.91, free T3 3.0  Labs 07/09/13; HbA1c was 11.4%, compared with 9.1% at last visit and with 14.0% at the visit prior. TSH was 0.652, free T4 0.88, free T3 3.3  Labs 10/06/12: TSH 0.088, free T4 1.28, free T3 3.1  Labs 02/25/12: TSH 1.044, free T4 1.45, free T3 3.4  Labs 06/14/11: TSH was 1.633. Free T4 1.47. Free T3 was 3.1. TSI was 56 (normal less than 120).                       ASSESSMENT: 1.  Type 1 diabetes mellitus:   A. Her BG control has improved, but needs further improvement. She is more consistent about checking BGs and giving injections than she was. She needs 1 unit more Lantus insulin and more Novolog on the days she takes steroids.   B. She really needs the T-Slim pump and Dexcom G6 sensor and is now interested in obtaining them.  2.  Hypoglycemia: She had had four documented low BGs in the past two weeks. She feels the low BGs coming on if they drop rapidly, but does not always feel the low BGs that occur slowly and progressively until she is quite low.  3. Autonomic neuropathy with tachycardia and gastroparesis: Her heart rate is higher, presumably due to her higher BGs.  4-8 Autoimmune thyroid disease, Graves' disease, Hashimoto's  disease, and goiter:   A. Thyrotoxicosis: Her diffuse thyrotoxicosis, secondary to Graves' disease, has waxed and waned over time.  Her thyrotoxicosis was well controlled by her current methimazole doses in January 2018 and again in February 2019. In October 2019, however, she was again hyperthyroid, so I asked her to increase the methimazole to 15 mg, twice daily. She heard 15 mg daily, so she had not had adequate treatment for her Berenice Primas' disease for several months. At her last visit in February 2021 I asked her to take 10 mg of MTZ daily. We need new TFTs. Her TSH in January 2022 was mid-euthyroid.   B. Hashimoto's thyroiditis: The patient's thyroiditis is clinically quiescent at this time. However, the pattern in which all 3 of her TFTs decreased in parallel together from 2013 to 2014 indicated that she had had a relatively recent flare up of Hashimoto's Dz. I expect that as her Hashimoto's disease progressively destroys more thyroid cells, we will be able to taper her methimazole to zero. She will probably become permanently hypothyroid over time and will require Synthroid hormone replacement.  C. Goiter: Thyroid gland is larger today, c/w recent thyroiditis. The waxing and waning of thyroid lobe size is c/w both with evolving Hashimoto's disease and fluctuating Graves' Dz activity.  9. Celiac disease: Daisi is following her gluten-free diet and is doing well.  10.  Chronic inflammatory demyelinating polyneuropathy (CIDP) and muscle weakness: Her left leg is stronger, but her right leg remains very weak.    11-12. Fatigue and decreased stamina: These problems are better. Since she had been on a regimen of 1-2 iv glucocorticoid treatment per week for many months, it was possible that her adrenals were suppressed. However, her last ACTH and cortisol levels in November 2016 were normal. Her ACTH stimulation test results in July 2018 were also quite normal.  13. Emotionality: Her anxiety has essentially  resolved.  14-15. Dyspepsia-GERD: She is doing fairly well overall. 16. Hypertension: Her SBP is good today, but her DBP is still elevated. Given Doll's muscular weakness and tendency to be physically unbalanced, it is not appropriate to increase her BP medication too much, otherwise she will have more falls.  17.  Adjustment reaction to illness and medical therapy: I continue to be impressed and inspired by Emy's dignity and courage as she tries to do all that she can to improve her own health.  PLAN: 1. Diagnostic: HbA1c and CBG today. Reviewed her last two sets of lab results.    2. Therapeutic:   A. Increase the Lantus dose to 31 units as tolerated. Continue the current Novolog plan, but increase the doses at meals on the day of steroids by 2  units at each meal. Check BGs at meals and at bedtime every day.   B. Continue current doses of her endocrine meds. We will adjust her MTZ dose after seeing her lab results.    C. If she decides to transition back to pump therapy, we will arrange an appointment with Dr. Drexel Iha to start the paperwork for a T-slim pump and Dexcom G6.  3. Patient education: We discussed all of the above at great length.  4. Follow-up: 2 months in a new patient slot.   Level of Service: This visit lasted in excess of 75 minutes. More than 50% of the visit was devoted to counseling  Tillman Sers, MD, CDE Adult and Pediatric Endocrinology

## 2020-07-31 ENCOUNTER — Encounter (INDEPENDENT_AMBULATORY_CARE_PROVIDER_SITE_OTHER): Payer: Self-pay | Admitting: "Endocrinology

## 2020-07-31 ENCOUNTER — Other Ambulatory Visit: Payer: Self-pay

## 2020-07-31 ENCOUNTER — Ambulatory Visit (INDEPENDENT_AMBULATORY_CARE_PROVIDER_SITE_OTHER): Payer: BC Managed Care – PPO | Admitting: "Endocrinology

## 2020-07-31 VITALS — BP 116/78 | HR 115 | Wt 137.0 lb

## 2020-07-31 DIAGNOSIS — R000 Tachycardia, unspecified: Secondary | ICD-10-CM

## 2020-07-31 DIAGNOSIS — E1043 Type 1 diabetes mellitus with diabetic autonomic (poly)neuropathy: Secondary | ICD-10-CM

## 2020-07-31 DIAGNOSIS — E1065 Type 1 diabetes mellitus with hyperglycemia: Secondary | ICD-10-CM | POA: Diagnosis not present

## 2020-07-31 DIAGNOSIS — E10649 Type 1 diabetes mellitus with hypoglycemia without coma: Secondary | ICD-10-CM

## 2020-07-31 DIAGNOSIS — G6181 Chronic inflammatory demyelinating polyneuritis: Secondary | ICD-10-CM

## 2020-07-31 DIAGNOSIS — R1013 Epigastric pain: Secondary | ICD-10-CM

## 2020-07-31 DIAGNOSIS — E063 Autoimmune thyroiditis: Secondary | ICD-10-CM

## 2020-07-31 DIAGNOSIS — F4320 Adjustment disorder, unspecified: Secondary | ICD-10-CM

## 2020-07-31 DIAGNOSIS — I10 Essential (primary) hypertension: Secondary | ICD-10-CM

## 2020-07-31 LAB — POCT GLUCOSE (DEVICE FOR HOME USE): POC Glucose: 226 mg/dl — AB (ref 70–99)

## 2020-07-31 LAB — POCT GLYCOSYLATED HEMOGLOBIN (HGB A1C): Hemoglobin A1C: 8.9 % — AB (ref 4.0–5.6)

## 2020-07-31 NOTE — Patient Instructions (Signed)
Follow up visit in 2 months.  

## 2020-09-24 ENCOUNTER — Encounter (INDEPENDENT_AMBULATORY_CARE_PROVIDER_SITE_OTHER): Payer: Self-pay

## 2020-09-26 ENCOUNTER — Other Ambulatory Visit: Payer: Self-pay | Admitting: Obstetrics & Gynecology

## 2020-09-26 ENCOUNTER — Telehealth (INDEPENDENT_AMBULATORY_CARE_PROVIDER_SITE_OTHER): Payer: Self-pay | Admitting: Pediatrics

## 2020-09-26 DIAGNOSIS — Z363 Encounter for antenatal screening for malformations: Secondary | ICD-10-CM

## 2020-09-26 DIAGNOSIS — O0990 Supervision of high risk pregnancy, unspecified, unspecified trimester: Secondary | ICD-10-CM

## 2020-09-26 DIAGNOSIS — G6181 Chronic inflammatory demyelinating polyneuritis: Secondary | ICD-10-CM

## 2020-09-26 DIAGNOSIS — E0590 Thyrotoxicosis, unspecified without thyrotoxic crisis or storm: Secondary | ICD-10-CM

## 2020-09-26 NOTE — Telephone Encounter (Signed)
Who's calling (name and relationship to patient) : Jamiaya (self)  Best contact number: [phone]  Provider they see: Dr. Gaynell Face  Reason for call:  Jessica Kelley called in stating that her last name has changed, she submitted to the insurance company the name change. Wants to notify Dr. Gaynell Face that a PA will be coming for her but it will be under Jessica Kelley.   Call ID:      PRESCRIPTION REFILL ONLY  Name of prescription:  Pharmacy:

## 2020-09-26 NOTE — Telephone Encounter (Signed)
Noted, thank you

## 2020-09-29 ENCOUNTER — Telehealth (INDEPENDENT_AMBULATORY_CARE_PROVIDER_SITE_OTHER): Payer: Self-pay | Admitting: Pediatrics

## 2020-09-29 NOTE — Telephone Encounter (Signed)
Wanted to let the staff know that the information the office is waiting on may be under the name Jessica Kelley or Jessica Kelley. Patient has recently been married and changed names recently.

## 2020-09-29 NOTE — Telephone Encounter (Signed)
I called patient and let her know Tina's message. Patient verbalized agreement and understanding. She will be calling insurance and [facility] for them to send paperwork.

## 2020-09-29 NOTE — Telephone Encounter (Signed)
I have not received any documents for PA for Alynn's treatments. They typically come from [place]. We need that information to do the PA. TG

## 2020-09-29 NOTE — Telephone Encounter (Signed)
  Who's calling (name and relationship to patient) : Self  Best contact number: [phone]  Provider they see: Gaynell Face  Reason for call: Stated her insurance is requiring a prior auth for the treatments she receives including globulin and the steroid. Please advise.       PRESCRIPTION REFILL ONLY  Name of prescription:  Pharmacy:

## 2020-10-02 NOTE — Telephone Encounter (Signed)
Form has been given to [employer]

## 2020-10-02 NOTE — Telephone Encounter (Signed)
  Who's calling (name and relationship to patient) : Jessica Kelley   Best contact number:[phone]  Provider they see:Dr. Gaynell Face   Reason for call:called to follow up on a PA that was put in and she cant get a hold of insurance. Please advise.      PRESCRIPTION REFILL ONLY  Name of prescription:  Pharmacy:

## 2020-10-02 NOTE — Telephone Encounter (Signed)
Patient has called back and is requesting that prior auth be completed as soon as possible. She states that she is out of medication and this is the second week without it. She states that insurance has her listed under her married last name of Mcglade.She requests call back with Josem Kaufmann has been completed.  [phone]

## 2020-10-02 NOTE — Telephone Encounter (Signed)
Otila Kluver is currently working on this PA

## 2020-10-03 NOTE — Telephone Encounter (Signed)
Patient called in asking if anymore information was needed for prior auth. She has new insurance information. This information was confirmed by front office

## 2020-10-03 NOTE — Telephone Encounter (Signed)
Patient would like an update on this request as soon as possible so she can get info to those that do her treatments

## 2020-10-03 NOTE — Telephone Encounter (Signed)
Per the last message from [place], the medication was approved and [facility] was informed. I informed the patient of this

## 2020-10-03 NOTE — Telephone Encounter (Signed)
Patient called back and stated that [facility] has not received anything.

## 2020-10-03 NOTE — Telephone Encounter (Signed)
I called Jessica Kelley with [facility] and told her that I had received approval for the Gamm[place]ard. She said that she did not receive notification of that and will call [employer]. TG

## 2020-10-03 NOTE — Telephone Encounter (Signed)
The Gammagard was approved by [place]. I left a message with Glenard Haring with [facility] to let her know. TG

## 2020-10-04 NOTE — Telephone Encounter (Signed)
Thank you :)

## 2020-10-04 NOTE — Telephone Encounter (Signed)
Patient's mom, Georgana Curio, who is on DPR called and left voicemail requesting call back to try and figure out what the breakdown in communication is regarding the prior authorization. States that patient has not had medication in two weeks and is starting to show signs of regression. Call back number is [phone]

## 2020-10-19 ENCOUNTER — Other Ambulatory Visit: Payer: BC Managed Care – PPO

## 2020-10-19 ENCOUNTER — Ambulatory Visit: Payer: BC Managed Care – PPO

## 2020-10-30 ENCOUNTER — Other Ambulatory Visit (INDEPENDENT_AMBULATORY_CARE_PROVIDER_SITE_OTHER): Payer: Self-pay | Admitting: "Endocrinology

## 2020-10-31 ENCOUNTER — Telehealth (INDEPENDENT_AMBULATORY_CARE_PROVIDER_SITE_OTHER): Payer: Self-pay | Admitting: "Endocrinology

## 2020-10-31 MED ORDER — NOVOLOG FLEXPEN 100 UNIT/ML ~~LOC~~ SOPN: PEN_INJECTOR | SUBCUTANEOUS | 4 refills | Status: DC

## 2020-10-31 NOTE — Telephone Encounter (Signed)
  Who's calling (name and relationship to patient) : [employer] ( self)  Best contact number: [phone]  Provider they see: Dr. Tobe Sos  Reason for call: Patient needed to update her last name and insurance in our system so the medication will need to be resent to [facility] please      [place]  Name of prescription: [place]: [place] [address]

## 2020-11-24 ENCOUNTER — Encounter (INDEPENDENT_AMBULATORY_CARE_PROVIDER_SITE_OTHER): Payer: Self-pay | Admitting: "Endocrinology

## 2020-11-29 ENCOUNTER — Ambulatory Visit (INDEPENDENT_AMBULATORY_CARE_PROVIDER_SITE_OTHER): Payer: BC Managed Care – PPO | Admitting: Pediatrics

## 2020-12-08 ENCOUNTER — Telehealth (INDEPENDENT_AMBULATORY_CARE_PROVIDER_SITE_OTHER): Payer: Self-pay | Admitting: "Endocrinology

## 2020-12-08 DIAGNOSIS — E063 Autoimmune thyroiditis: Secondary | ICD-10-CM

## 2020-12-08 DIAGNOSIS — H8113 Benign paroxysmal vertigo, bilateral: Secondary | ICD-10-CM

## 2020-12-08 NOTE — Telephone Encounter (Signed)
Rhandi called the office to ask for help. 2. She has been having dizzy spells for several weeks, of and on. The episodes usually resolved within a few minutes and did not prevent her from going to work.  3. Today she has been severely dizzy all day and is not able to go to work. The dizziness has a spinning quality and is severe when she tries to stand up or to move her head and body.  4. In early May she was pregnant and hypertensive, so her OB stopped the methimazole and the lisinopril, and started her on labetalol, 200 mg, twice daily. She had a miscarriage on May 11. Her BPs have been wonderful on labetalol. She does not feel hypothyroid or hypothyroid.  5. She does not feel that her ears are full. Her hearing is good. She is not having any buzzing in her ears. Her mother has had severe vertigo life-long. 6. When we were talking she was seated and her dizziness was not too bad. I asked her to turn her head to the right, which causes an increase in her spinning dizziness. I then asked her to turn her head to the left and the dizziness was worse. I then asked her to turn her head rapidly from side to side several times. The dizziness worsened. Finally I asked her to bend her heal forward and then back. The dizziness became extreme. 7. Nour reminded me that she had a head injury in September 2020. At that time she had fallen out of her wheelchair. She had fractured the occipital bone, had a concussion, and loss of consciousness. She also had small bilateral subdural bleeding.  8. It appears that Amma has developed positional vertigo over the past several weeks and that her vertigo became extreme today. I don't know whether the cessation of the MTZ has affected the vertigo. Although I doubt that her previous head injury is causing vertigo 22 months later, I will forward this note to Dr. Gaynell Face for his review. 9. I ordered TFTs, CMP, and CBC to be done today. I recommended that Lilyannah take  75 mg of meclizine, 3 times daily for the next several days, and then taper the dose as tolerated. She will call me on Monday with a progress report.   Renee Rival, CDE Adult and Pediatric Endocrinology

## 2020-12-08 NOTE — Telephone Encounter (Signed)
I called and discussed with Dr. Tobe Sos who is going to call patient to discuss and order labs. Patient can be reached at [phone]. [name]

## 2020-12-08 NOTE — Telephone Encounter (Signed)
  Who's calling (name and relationship to patient) : Self  Best contact number: [phone]   Provider they see: Tobe Sos   Reason for call:  Patient feels dizzy and feels she is being pulled to the left. These symptoms started a few weeks ago, but have worsened this week. Patient was started on a new thyroid and blood pressure medication. She was wondering if symptoms could be from this. Please advise.     PRESCRIPTION REFILL ONLY  Name of prescription:  Pharmacy:

## 2020-12-14 ENCOUNTER — Encounter (INDEPENDENT_AMBULATORY_CARE_PROVIDER_SITE_OTHER): Payer: Self-pay | Admitting: *Deleted

## 2020-12-14 LAB — CBC WITH DIFFERENTIAL/PLATELET
Absolute Monocytes: 548 cells/uL (ref 200–950)
Basophils Absolute: 69 cells/uL (ref 0–200)
Basophils Relative: 1.1 %
Eosinophils Absolute: 19 cells/uL (ref 15–500)
Eosinophils Relative: 0.3 %
HCT: 41.7 % (ref 35.0–45.0)
Hemoglobin: 13.7 g/dL (ref 11.7–15.5)
Lymphs Abs: 1613 cells/uL (ref 850–3900)
MCH: 29.7 pg (ref 27.0–33.0)
MCHC: 32.9 g/dL (ref 32.0–36.0)
MCV: 90.5 fL (ref 80.0–100.0)
MPV: 11.9 fL (ref 7.5–12.5)
Monocytes Relative: 8.7 %
Neutro Abs: 4051 cells/uL (ref 1500–7800)
Neutrophils Relative %: 64.3 %
Platelets: 205 10*3/uL (ref 140–400)
RBC: 4.61 10*6/uL (ref 3.80–5.10)
RDW: 12.5 % (ref 11.0–15.0)
Total Lymphocyte: 25.6 %
WBC: 6.3 10*3/uL (ref 3.8–10.8)

## 2020-12-14 LAB — T4, FREE: Free T4: 1.1 ng/dL (ref 0.8–1.8)

## 2020-12-14 LAB — COMPREHENSIVE METABOLIC PANEL
AG Ratio: 0.5 (calc) — ABNORMAL LOW (ref 1.0–2.5)
ALT: 31 U/L — ABNORMAL HIGH (ref 6–29)
AST: 38 U/L — ABNORMAL HIGH (ref 10–30)
Albumin: 3.2 g/dL — ABNORMAL LOW (ref 3.6–5.1)
Alkaline phosphatase (APISO): 88 U/L (ref 31–125)
BUN: 16 mg/dL (ref 7–25)
CO2: 26 mmol/L (ref 20–32)
Calcium: 8.6 mg/dL (ref 8.6–10.2)
Chloride: 99 mmol/L (ref 98–110)
Creat: 0.56 mg/dL (ref 0.50–0.96)
Globulin: 6 g/dL (calc) — ABNORMAL HIGH (ref 1.9–3.7)
Glucose, Bld: 142 mg/dL — ABNORMAL HIGH (ref 65–139)
Potassium: 3.6 mmol/L (ref 3.5–5.3)
Sodium: 134 mmol/L — ABNORMAL LOW (ref 135–146)
Total Bilirubin: 0.3 mg/dL (ref 0.2–1.2)
Total Protein: 9.2 g/dL — ABNORMAL HIGH (ref 6.1–8.1)

## 2020-12-14 LAB — T3, FREE: T3, Free: 3.5 pg/mL (ref 2.3–4.2)

## 2020-12-14 LAB — TSH: TSH: 0.93 mIU/L

## 2020-12-15 ENCOUNTER — Encounter (INDEPENDENT_AMBULATORY_CARE_PROVIDER_SITE_OTHER): Payer: Self-pay

## 2021-01-31 ENCOUNTER — Ambulatory Visit (INDEPENDENT_AMBULATORY_CARE_PROVIDER_SITE_OTHER): Payer: BC Managed Care – PPO | Admitting: "Endocrinology

## 2021-02-20 ENCOUNTER — Other Ambulatory Visit (INDEPENDENT_AMBULATORY_CARE_PROVIDER_SITE_OTHER): Payer: Self-pay | Admitting: "Endocrinology

## 2021-02-20 DIAGNOSIS — E1065 Type 1 diabetes mellitus with hyperglycemia: Secondary | ICD-10-CM

## 2021-04-08 NOTE — Progress Notes (Signed)
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  HISTORY OF PRESENT ILLNESS: The patient is a 30 year-old Caucasian young woman. She was unaccompanied today.   23. This 30 year-old Caucasian young woman with an already complicated medical history was first referred to me on 09/25/06 by her primary care provider, Dr. Garnet Koyanagi, for evaluation and management of type I diabetes mellitus, hypoglycemia, and a myriad of other autoimmune issues.   A. CIDP: At about age 63 the child began to limp.  At age 26 she was evaluated at [facility] and diagnosed with CIDP. At the time I first met her she was under the care of Dr. Marcene Brawn of [facility] in [place]. She was on a regular regimen of monthly intravenous treatments at home with IVIG and steroids on the days of therapy. She was also taking cyclosporine A daily, with alternating doses every other day. Because of the CIDP she had  significant problems with muscle atrophy and weakness, as well as multiple orthopedic issues. She was also being followed by Dr. Simonne Come, an orthopedist in [place]. On that first visit her legs and arms were quite weak. She found it very hard to walk or to exercise.  B. Type 1 diabetes mellitus: Her diabetes mellitus was also diagnosed at age 47, just prior to the diagnosis of CIDP. She had been started on a Medtronic Paradigm 712 insulin pump subsequently and was using Humalog lispro insulin in her pump. She had a very difficult time with blood glucose control. At the time of her steroids, her sugars tended to run very  high. However, when she was able to perform some physical activities, she tended to develop hypoglycemia. Her hemoglobin A1c on that first visit was 9.5%.  C. Celiac disease: The patient was diagnosed with celiac disease at approximately age 50. She had been on a gluten-free diet ever since.  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 She was followed both at [facility] and at the [facility] eye clinic.  2. Since that initial visit with me, I have followed her for the problems listed above plus other new problems that have developed over time.   A. CIDP: Her CIDP course has waxed and waned. At one point she had improved significantly, her medication doses had been reduced, she felt very much stronger, had more stamina, and was doing very well. Unfortunately, she later had a significant relapse of CIDP and lost much of the improvement that she had gained. Since going back to [facility] in the Spring of 2013, however, and starting a new and more intensified IVIG regimen, her strength had improved. She was still very weak physically and muscularly, however, and needed support to walk. As noted below, she gets stronger when her steroids are increased and weaker when the steroids are tapered. In the past  years Dr. Wyline Copas, MD, our senior pediatric neurologist, had assumed the role of managing her CIDP. More recently, however, she has been seeing Dr. Gustavus Bryant at [facility].  B. Type 1 diabetes mellitus: The patient's blood sugars have varied significantly with the course of her CIDP and the treatments  for that disease. Her hemoglobin A1c values have ranged from a low of 8.3% to a high of 14.0%. Most of her A1c values have been in the 8.4-10.1% range.   C. Celiac disease: The patient has really done quite well over the years. She rarely has had signs or symptoms of  abdominal problems. Because her antibody levels fluctuated a great deal, her celiac disease antibodies were negative in March 2012. She was mistakenly told that she did not have celiac disease and that she could resume a normal diet. As expected, resumption of a normal diet caused a recurrence of active celiac disease. In October 2012 she saw a gastroenterologist, Dr. Gale Journey, at [facility] who told her that she did have celiac disease. She is now back on her gluten-free diet almost all the time and is asymptomatic again.  D. Autoimmune thyroid disease: The patient had a goiter and a TPO antibody level of 287.2 on her first visit in May of 2008, consistent with evolving Hashimoto's Disease. She occasionally had the sensations of swelling and discomfort in her anterior neck, which were also c/w flare-ups of Hashimoto's Disease. In early 2009 she developed increased tachycardia. Lab tests performed in March 2010 by her primary care provider showed a TSH of 0.01 and a T4 of 7.7. It was initially felt that the increase in tachycardia was likely due to a combination of autonomic neuropathy and Hashitoxicosis.  However on follow-up laboratory tests performed in July, her TSH was 0.05, free T4 3.9, and free T3 14.2. Her TSI level done on the assay used at the time was 2.2, with normal being 1.0 or less. It was then evident that she had developed Graves' Disease in the setting of pre-existing Hashimoto's Disease.  I initiated treatment with methimazole, 10 mg twice daily and propranolol 20 mg 3 times daily. Since then we've seen significant swings of her thyroid function tests, partly due to changes in methimazole doses, but also partly due to changes in her immunosuppressant therapies. Her TSH values have ranged from a low of 0.01 to a high of 14.933. The dose of methimazole has been adjusted as needed to treat her Graves' disease. It has been my hope that her Hashimoto's disease will eventually cancel out her Graves' disease.    E. 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 She has since recovered fairly well, but her sense of smell is still absent and her new OCD tendencies persist.   F. Her husband was positive for covid-19 on 05/24/19. She was positive as well. She has since recovered.  3.The patient's last [facility] visit was on 07/31/20. At that visit I increased her Lantus dose to 31 units as tolerated and continued her current Novolog plan, but add two units at each meal. I continued her MTZ dose of 10 mg, once daily. She was supposed to return to clinic in two months, but did not.   A. In April 2022 she was pregnant and her OB discontinued her MTZ and switched her from lisinopril to labetolol 100 mg, twice daily. In May 2022 she had a miscarriage.  B. In the interim she has been generally healthy.  C. She has been taking the Lantus dose of 31 units and following her Novolog plan. She obtained a Dexcom in September 2022.  D. Her BP has been stable. She continues taking labetolol.  E. She has not been having any hyperthyroid symptoms or hypothyroid symptoms, except for being more tired. Her energy level  is "good'. She is not jittery or shaky. She is sleeping well, but still occasionally has insomnia.   F. She continues to have frequent and annoying OCD tendencies since her brain injury, for example, checking the door locks 11 times per day, making sure the stove is off multiple times, and making sure all the electrical appliances are plugged in correctly.   G. Her CIDP is about the same. She saw Dr. Edison Nasuti at the  [facility] in June 2018. No changes in her CDIP treatment plan were made. She remains on her regimen of IVIG and iv steroids. She continues to have IVIG every Monday and Thursday. She receives iv steroids on Monday. Her steroid dose has not been changed since her last visit. better. She uses a wheel chair at work, but uses her walker to walk 1 mile per day. She occasionally has headaches  on Thursdays after IVIG. She converted to seeing Dr. Gustavus Bryant at the [facility] for her CIDP management. Her CIDP regimen is unchanged.   H. Her BGs have continued to be variable, with some spikes on Mondays and Tuesdays. She is also having some high BGs in the mornings. She occasionally ha slow BGs if she has not eaten enough during the work day.   I. She is also still taking vitamin D, iron, and a vitamin B complex pill. She remains on her gluten-free diet, for the most part.  J. She continues on her MDI regimen. She still takes 31 units of Lantus each evening. She takes Novolog according to our 150/50/5 plan, with plus ups of 2 units at meals.  K. She is more anxious about her health and about what might go wrong.  She frequently looks up health issues on the internet and then gets concerned that she night have the diseases she reads about.    L. She lives with her husband. He now works the day shift 5 days per week.    M. She is not trying to get pregnant now, but is not on birth control. They have been using condoms.   4. Pertinent Review of Systems: Constitutional: Saphronia says she feels "fine, good" today. She has a good energy level. Her appetite is unchanged. She drinks about 12-24 ounces of caffeinated drinks per day. Her strength and stamina have been pretty good in the past months. When she uses her wheel chair at work, she is less tired and has better stamina.  Eyes: Her contact lenses are helping. Last eye exam was in July 2021. She did not have signs of DM. She has an exam scheduled soon.  Neck: The patient has not had any problems with anterior neck swelling and tenderness for more than a year.   Heart: Her heart rate has been normal. She has no complaints of palpitations, irregular heart beats, chest pain, or chest pressure.   Gastrointestinal:  She occasionally notes food sticking, bus she swallows again and the problem resolves. Most of her GI problems resolved after she stopped  eating pork products. She has "a little bit of bloating at times". Bowel movents are usually normal, unless she goes off her gluten-free diet. The patient has no other complaints of excessive hunger, acid reflux, diarrhea, or constipation.  Hands: No tremor Legs: Muscle mass and strength seem to be "about the same". There are few complaints of numbness, tingling, burning, or pain. No edema is noted.  Feet: There are no obvious new foot problems. There are no complaints of numbness, tingling, burning,  or pain. No edema is noted. Neurologic: She still requires the use of her walker or wheel chair. Muscle weakness is still about the same. Sensation seems to be normal. Coordination is about the same. GYN: She is having menses now. Her IUD was removed. Her periods occur monthly.   Scalp: She is no longer losing hair. Hypoglycemia: She has had several low BGs during the day at work when she did not eat much during the day     5. Dexcom review: We have data from the past two weeks. Average SG was 241. Her SG range was about 100-370. Her highest BGS occurred after meals, especially on Mondays and Tuesdays when she has steroids and IVIG.      PAST MEDICAL, FAMILY, AND SOCIAL HISTORY: 1. 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 She was married in May 2020. Her husband now works the day shift.  2. Activities: She has been walking more.  3. Smoking, alcohol, or drugs: None 4. Primary Care Provider: Dr. Garnet Koyanagi at [facility]. 5. Neurologist: Dr. Gustavus Bryant, [facility] every 6 months.  REVIEW OF SYSTEMS: There are no other significant problems involving the patient's other body systems.  PHYSICAL EXAM: BP 122/76 (BP Location: Right Arm, Patient Position: Sitting, Cuff Size: Normal)   Pulse 74   Wt 149 lb (67.6 kg)   LMP 08/23/2020   BMI 25.98 kg/m   Wt Readings from Last 3 Encounters:  04/09/21 149 lb (67.6 kg)  07/31/20 137 lb (62.1 kg)  06/02/20 139  lb (63 kg)    Ht Readings from Last 3 Encounters:  06/02/20 5' 3.5" (1.613 m)  03/14/20 5' 3.5" (1.613 m)  01/05/20 5' 3.5" (1.613 m)    HC Readings from Last 3 Encounters:  No data found for [facility]   Facility age limit for growth percentiles is 20 years.  Body mass index is 25.98 kg/m. Facility age limit for growth percentiles is 20 years.  Body surface area is 1.74 meters squared.  Constitutional: This patient is in her wheelchair today. She appears healthy and looks healthy, but heavier. She has gained 12 pounds in 8 months, equivalent to a net calorie gain of 164 calories per day. Her affect and insight are quite good today.  Head: The head is normocephalic. Face: The face appears normal. There are no obvious dysmorphic features. Eyes: The eyes appear to be normally formed and spaced. Gaze is conjugate. There is no obvious arcus or proptosis. Moisture appears normal. Mouth: The oropharynx and tongue appear normal. Dentition appears to be normal for age. Oral moisture is normal. She has no tongue tremor.  Neck: On inspection the thyroid gland is again visibly enlarged. The gland is larger at about 21-22 grams in size. Today the lobes are again symmetrically enlarged. The thyroid gland is softer today and is not tender to palpation. Lungs: The lungs are clear to auscultation. Air movement is good. Heart: Heart rate is normal. Heart rhythm is regular. Heart sounds S1 and S2 are normal. I do not hear her previous grade 2/6 SEM flow murmur due to her Graves' disease.   Abdomen: The abdomen is larger in size today. Bowel sounds are normal. There is no obvious hepatomegaly, splenomegaly, or other mass effect.  Arms: Muscle size and bulk are low-normal for age. Hands: There is a faint, fine tremor today. Phalangeal and metacarpophalangeal joints are normal. Palmar muscles are low-normal for age. Palmar skin shows no palmar erythema. Palmar moisture  is normal.. Legs: Muscles appear small for  age, especially in the right leg. No edema is present. Feet: Dorsalis pedal pulses are 1+ on the right and 1+ on the left.  Neurologic: Strength is 4-5/5 for age in the upper extremities, 2/5 in the right leg, and 4/5 in the left leg. Muscle tone is somewhat low. Sensation to touch is normal in both her legs and feet.   Skin: No rashes Scalp: Overall the scalp looks pretty good.   LAB DATA:  Labs 04/09/21: HbA1c 8.6%, CBG 294; TFTS and TSI pending  Labs 12/13/20: TSH 0.93, free T4 1.1, free T3 3.5; CMP normal, except for a glucose of 142, sodium of 134, albumin of 3.2, and globulin of 6.0. ; CBC normal  Labs 07/31/20: HbA1c 8.9%, CBG 226  Labs 06/23/20: free  T4 1.2, free T3 3.9; CMP normal, except glucose 284; hCG negative  Labs 06/19/20: TSH 1.60, free T4 1.3, free T3 3.8; CMP normal, except albumin 3.5 (ref 3.6-5.1), total protein 8.7 (ref 6.1-8.1), globulin 5.2 (ref 1.9-3.7), AST 41 (ref 10-30);    Labs 01/17/20; HbA1c 9.1%, CBG 245  Labs 06/23/19: HbA1c 10.3%, CBG 282; TSH 1.97, free T4 1.1, free T3 3.9; CMP abnormal, with glucose 296, sodium 131 (ref 135-146), calcium 8.3 (ref 8.6-10.2), albumin 3.4 (ref 3.6-5.1), globulin 5.8 (ref 1.9-3.7); CBC normal;  Labs 11/05/18: CBG: 192; TSH 0.03, free T4 1.6, free T3 3.9, TSI <89; CMP normal except glucose of 341, sodium 131, albumin 3.2 (ref 3.6-5.1), globulin 7.2 (ref 1.9-3.7); cholesterol 157, triglycerides 62, HDL 52, LDL 91; urinary microalbumin/creatinine ratio 40  Labs 08/19/18: HbA1c 10.3%, CBG 174  Labs 04/27/18: HbA1c 9.1%, CBG 270; TSH 0.11, free T4 1.2, free T3 3.2; CMP normal, except glucose 264, sodium 131, albumin 3.3 (ref 3.6-5.1), globulin 8.2 (ref 1.9-3.7); CBC normal, except lymphocytes 257 (ref [id number])  Labs 02/23/18: CBG 264; TSH 0.35, free T4 1.1, free T3 3.7; CMP normal except glucose of 153, total protein 11.4 (ref 6.1-8.1), albumin 3.2 (ref 3.6-5.1), globulin 8.2 (ref 1.9-3.7); amylase 42 (ref 21-101)  Labs 01/09/18:  HbA1c 10.2%  Labs 08/27/17: CBG 220  Labs 06/25/17: HbA1c 9.6%, CBG 92; TSH 1.92, free T4 1.1, free T3 3.1, TSI <89; urine microalbumin/creatinine ratio 53 (ref <30); CBC normal; CMP normal, except for glucose 224, sodium 132, albumin 3.2 (ref 3.6-5.1), calcium 8.5 (ref 8.6-10.2, corrected to 9.2 for albumin level), globulin 6.1 (ref 1.9-3.7)  Labs 01/09/17: CBG 293  Labs 12/20/16: ACTH stimulation test: Cortisols 3, 18.9, 24.5; sodium 130, albumin 2.8  Labs 12/17/16: HbA1c 10.1%; TSH 1.14, free T4 1.2, free T3 2.6, TSI <89; WBC 15/8, Hgb 13.1, Hct 40/8%  Labs 07/11/16: HbA1c 11.5%, CBG 225  Labs 06/12/16: TSH 2.20, free T4 1.02, free T3 3.0; CBC normal; CMP normal except sodium 133, glucose 259, albumin 2.8, calcium 8.3  Labs 02/22/16: HbA1c 10.7%  Labs 10/18/15: HbA1c 10.8%  Labs 05/09/15: HbA1c 11.1%  Labs 04/07/15: CBC normal; TSH 1.826, free T4 1.06, free T3 2.8, TSI 156; iron 234, CMP normal except for sodium 132, glucose 159, albumin 2.9, calcium 8.7; ACTH 11, cortisol 10.5  Labs 11/08/14: HbA1c 10.9%; TSH 2.234, free T4 0.87, free T3 2.7, TSI pending  Labs 06/01/14: HbA1c is 9.7% today, compared with 11.5% at last visit. Other labs are pending.  Labs 02/17/14: TSH 2.125, free T4 0.91, free T3 3.0  Labs 07/09/13; HbA1c was 11.4%, compared with 9.1% at last visit and with 14.0% at the visit prior. TSH was 0.652,  free T4 0.88, free T3 3.3  Labs 10/06/12: TSH 0.088, free T4 1.28, free T3 3.1  Labs 02/25/12: TSH 1.044, free T4 1.45, free T3 3.4  Labs 06/14/11: TSH was 1.633. Free T4 1.47. Free T3 was 3.1. TSI was 56 (normal less than 120).                       ASSESSMENT: 1. Type 1 diabetes mellitus:   A. Her BG control has improved a bit, but needs mch more improvement. I have recommended a Dexcom and T-Slim pump to her previously, but she refused. She obtained a Dexcom in September and really likes it. Now she is willing to accept a pump. She is more consistent about giving  injections than she was.   B. She needs 2 units more Lantus insulin and more Novolog on the days she takes steroids.  2.  Hypoglycemia: She had not had any documented low BGs in the past two weeks. She feels the low BGs coming on if they drop rapidly, but does not always feel the low BGs that occur slowly and progressively until she is quite low.  3. Autonomic neuropathy with tachycardia and gastroparesis: Her heart rate is lower, presumably due to her lower BGs.  4-8 Autoimmune thyroid disease, Graves' disease, Hashimoto's disease, and goiter:   A. Thyrotoxicosis: Her diffuse thyrotoxicosis, secondary to Graves' disease, has waxed and waned over time.  Her thyrotoxicosis was well controlled by her current methimazole doses in January 2018 and again in February 2019. In October 2019, however, she was again hyperthyroid, so I asked her to increase the methimazole to 15 mg, twice daily. She heard 15 mg daily, so she had not had adequate treatment for her Berenice Primas' disease for several months. At her last visit in February 2021 I asked her to take 10 mg of MTZ daily. Her TSH in January 2022 was mid-euthyroid. We need new TFTs.   B. Hashimoto's thyroiditis: The patient's thyroiditis is clinically quiescent at this time. However, the pattern in which all 3 of her TFTs decreased in parallel together from 2013 to 2014 indicated that she had had a relatively recent flare up of Hashimoto's Dz. I expect that as her Hashimoto's disease progressively destroys more thyroid cells, we will be able to taper her methimazole to zero.  C. She has been off MTZ for 4 months. She is clinically euthyroid, but more anxious. We need to reassess her TFTs now.  D. She will probably become permanently hypothyroid over time and will require Synthroid hormone replacement.  E. Goiter: Thyroid gland is enlarged again today.. The waxing and waning of thyroid lobe size is c/w both with evolving Hashimoto's disease and fluctuating Graves' Dz  activity.  9. Celiac disease: When Chani follows her gluten-free diet, she does well.  10.  Chronic inflammatory demyelinating polyneuropathy (CIDP) and muscle weakness: Her left leg is stronger, but her right leg remains very weak.    11-12. Fatigue and decreased stamina: These problems are better. Since she had been on a regimen of 1-2 iv glucocorticoid treatment per week for many months, it was possible that her adrenals were suppressed. However, her last ACTH and cortisol levels in November 2016 were normal. Her ACTH stimulation test results in July 2018 were also quite normal.  13. Emotionality: Her anxiety has recurred since her grandfather's death.  14-15. Dyspepsia/GERD: She is not doing as well. 16. Hypertension: Her SBP is good today, but her DBP is still relatively  high.  Given Jessye's muscular weakness and tendency to be physically unbalanced, it is not appropriate to increase her BP medication too much, otherwise she will have more falls.  17.  Adjustment reaction to illness and medical therapy: I continue to be impressed and inspired by Gena's dignity and courage as she tries to do all that she can to improve her own health.  PLAN: 1. Diagnostic: HbA1c and CBG today. TFTs, CMP, lipids, urinary microalbumin/creatinine ratio today    2. Therapeutic:   A. Increase the Lantus dose to 33 units as tolerated. Continue the current Novolog plan, but increase the doses at meals on the day of steroids and the day after by 3 units at each meal and on non-steroid days by 2 units at each meal. Check BGs at meals and at bedtime every day.   B. Continue current doses of her endocrine meds. We will consider resuming her MTZ dose or discontinuing it after seeing her lab results.    C. I will contact Dr. Lovena Le to start the paperwork for a T-slim pump.  3. Patient education: We discussed all of the above at great length.  4. Follow-up: 2 months in a new patient slot.   Level of Service:  This visit lasted in excess of 80 minutes. More than 50% of the visit was devoted to counseling  Tillman Sers, MD, CDE Adult and Pediatric Endocrinology

## 2021-04-09 ENCOUNTER — Encounter (INDEPENDENT_AMBULATORY_CARE_PROVIDER_SITE_OTHER): Payer: Self-pay | Admitting: "Endocrinology

## 2021-04-09 ENCOUNTER — Other Ambulatory Visit: Payer: Self-pay

## 2021-04-09 ENCOUNTER — Ambulatory Visit (INDEPENDENT_AMBULATORY_CARE_PROVIDER_SITE_OTHER): Payer: BC Managed Care – PPO | Admitting: "Endocrinology

## 2021-04-09 VITALS — BP 122/76 | HR 74 | Wt 149.0 lb

## 2021-04-09 DIAGNOSIS — E1065 Type 1 diabetes mellitus with hyperglycemia: Secondary | ICD-10-CM | POA: Diagnosis not present

## 2021-04-09 DIAGNOSIS — G6181 Chronic inflammatory demyelinating polyneuritis: Secondary | ICD-10-CM

## 2021-04-09 DIAGNOSIS — I10 Essential (primary) hypertension: Secondary | ICD-10-CM

## 2021-04-09 DIAGNOSIS — E10649 Type 1 diabetes mellitus with hypoglycemia without coma: Secondary | ICD-10-CM | POA: Diagnosis not present

## 2021-04-09 DIAGNOSIS — E1043 Type 1 diabetes mellitus with diabetic autonomic (poly)neuropathy: Secondary | ICD-10-CM | POA: Diagnosis not present

## 2021-04-09 DIAGNOSIS — E0500 Thyrotoxicosis with diffuse goiter without thyrotoxic crisis or storm: Secondary | ICD-10-CM | POA: Diagnosis not present

## 2021-04-09 DIAGNOSIS — F4320 Adjustment disorder, unspecified: Secondary | ICD-10-CM

## 2021-04-09 DIAGNOSIS — R5383 Other fatigue: Secondary | ICD-10-CM

## 2021-04-09 DIAGNOSIS — K900 Celiac disease: Secondary | ICD-10-CM

## 2021-04-09 DIAGNOSIS — R1013 Epigastric pain: Secondary | ICD-10-CM

## 2021-04-09 DIAGNOSIS — E063 Autoimmune thyroiditis: Secondary | ICD-10-CM

## 2021-04-09 LAB — POCT GLUCOSE (DEVICE FOR HOME USE): POC Glucose: 294 mg/dl — AB (ref 70–99)

## 2021-04-09 LAB — POCT GLYCOSYLATED HEMOGLOBIN (HGB A1C): Hemoglobin A1C: 8.6 % — AB (ref 4.0–5.6)

## 2021-04-09 NOTE — Patient Instructions (Signed)
Follow up visit in two months.  

## 2021-04-11 LAB — COMPREHENSIVE METABOLIC PANEL
AG Ratio: 0.6 (calc) — ABNORMAL LOW (ref 1.0–2.5)
ALT: 24 U/L (ref 6–29)
AST: 35 U/L — ABNORMAL HIGH (ref 10–30)
Albumin: 3.4 g/dL — ABNORMAL LOW (ref 3.6–5.1)
Alkaline phosphatase (APISO): 87 U/L (ref 31–125)
BUN: 12 mg/dL (ref 7–25)
CO2: 27 mmol/L (ref 20–32)
Calcium: 8.7 mg/dL (ref 8.6–10.2)
Chloride: 99 mmol/L (ref 98–110)
Creat: 0.58 mg/dL (ref 0.50–0.96)
Globulin: 5.7 g/dL (calc) — ABNORMAL HIGH (ref 1.9–3.7)
Glucose, Bld: 251 mg/dL — ABNORMAL HIGH (ref 65–99)
Potassium: 4.2 mmol/L (ref 3.5–5.3)
Sodium: 132 mmol/L — ABNORMAL LOW (ref 135–146)
Total Bilirubin: 0.4 mg/dL (ref 0.2–1.2)
Total Protein: 9.1 g/dL — ABNORMAL HIGH (ref 6.1–8.1)

## 2021-04-11 LAB — LIPID PANEL
Cholesterol: 187 mg/dL (ref <200)
HDL: 71 mg/dL (ref >=50)
LDL Cholesterol (Calc): 100 mg/dL (calc) — ABNORMAL HIGH
Non-HDL Cholesterol (Calc): 116 mg/dL (calc) (ref <130)
Total CHOL/HDL Ratio: 2.6 (calc) (ref <5.0)
Triglycerides: 73 mg/dL (ref <150)

## 2021-04-11 LAB — TSH: TSH: 0.14 mIU/L — ABNORMAL LOW

## 2021-04-11 LAB — T4, FREE: Free T4: 1.3 ng/dL (ref 0.8–1.8)

## 2021-04-11 LAB — THYROID STIMULATING IMMUNOGLOBULIN: TSI: 92 % baseline (ref <140)

## 2021-04-11 LAB — T3, FREE: T3, Free: 4.4 pg/mL — ABNORMAL HIGH (ref 2.3–4.2)

## 2021-04-16 ENCOUNTER — Telehealth (INDEPENDENT_AMBULATORY_CARE_PROVIDER_SITE_OTHER): Payer: Self-pay | Admitting: Pharmacist

## 2021-04-16 NOTE — Telephone Encounter (Signed)
Please contact patient to schedule a prepump appointment (60 min, in person or virtual)  Thank you for involving clinical pharmacist/diabetes educator to assist in providing this patient's care.   Drexel Iha, PharmD, BCACP, [place], CPP

## 2021-04-17 ENCOUNTER — Other Ambulatory Visit (INDEPENDENT_AMBULATORY_CARE_PROVIDER_SITE_OTHER): Payer: Self-pay | Admitting: "Endocrinology

## 2021-04-17 DIAGNOSIS — E1065 Type 1 diabetes mellitus with hyperglycemia: Secondary | ICD-10-CM

## 2021-04-24 ENCOUNTER — Other Ambulatory Visit (INDEPENDENT_AMBULATORY_CARE_PROVIDER_SITE_OTHER): Payer: Self-pay | Admitting: "Endocrinology

## 2021-04-24 DIAGNOSIS — E1065 Type 1 diabetes mellitus with hyperglycemia: Secondary | ICD-10-CM

## 2021-05-15 ENCOUNTER — Encounter (INDEPENDENT_AMBULATORY_CARE_PROVIDER_SITE_OTHER): Payer: Self-pay | Admitting: "Endocrinology

## 2021-05-21 ENCOUNTER — Encounter: Payer: Self-pay | Admitting: Family Medicine

## 2021-05-24 ENCOUNTER — Other Ambulatory Visit (INDEPENDENT_AMBULATORY_CARE_PROVIDER_SITE_OTHER): Payer: Self-pay | Admitting: "Endocrinology

## 2021-05-24 DIAGNOSIS — E1065 Type 1 diabetes mellitus with hyperglycemia: Secondary | ICD-10-CM

## 2021-06-01 ENCOUNTER — Other Ambulatory Visit: Payer: Self-pay

## 2021-06-01 ENCOUNTER — Telehealth (INDEPENDENT_AMBULATORY_CARE_PROVIDER_SITE_OTHER): Payer: Self-pay | Admitting: "Endocrinology

## 2021-06-01 ENCOUNTER — Ambulatory Visit (INDEPENDENT_AMBULATORY_CARE_PROVIDER_SITE_OTHER): Payer: BC Managed Care – PPO | Admitting: Pharmacist

## 2021-06-01 VITALS — Wt 147.0 lb

## 2021-06-01 DIAGNOSIS — E1065 Type 1 diabetes mellitus with hyperglycemia: Secondary | ICD-10-CM | POA: Diagnosis not present

## 2021-06-01 DIAGNOSIS — E0500 Thyrotoxicosis with diffuse goiter without thyrotoxic crisis or storm: Secondary | ICD-10-CM

## 2021-06-01 LAB — POCT GLUCOSE (DEVICE FOR HOME USE): POC Glucose: 298 mg/dl — AB (ref 70–99)

## 2021-06-01 MED ORDER — INSULIN ASPART 100 UNIT/ML IJ SOLN: INTRAMUSCULAR | 3 refills | Status: DC

## 2021-06-01 MED ORDER — BAQSIMI TWO PACK 3 MG/DOSE NA POWD: 1.0000 | NASAL | 3 refills | Status: DC

## 2021-06-01 MED ORDER — TRESIBA FLEXTOUCH 100 UNIT/ML ~~LOC~~ SOPN: PEN_INJECTOR | SUBCUTANEOUS | 3 refills | Status: DC

## 2021-06-01 NOTE — Telephone Encounter (Signed)
Patient requests to have TFTs done. I ordered them. Tillman Sers, MD

## 2021-06-01 NOTE — Progress Notes (Signed)
S:     Chief Complaint  Patient presents with   Diabetes    Education    Endocrinology provider: Dr. Tobe Sos (upcoming appt 06/27/21 2:00 pm)  Patient has decided to initiate process to start Tandem t:slim X2 with control IQ insulin pump. PMH significant for T1DM, migraine without aura, inappropraite sinus tachycardia, celiac disease, hypothyroidism, goiter, chronic inflammatory demyelinating polyneuropathy, scoliosis, anemia, macular degeneration, OCD.   Patient presents today independently. She informed me she receives Solumedrol on Mondays. She has used a Medtronic pump in the past. She mentions to me that she has traveled to [place] and she ran out of insulin - had to pay $600; she wants to know what is best to do in those situations. She also reports having glucagon for emergency  kit (red kit) for emergencies.  Insurance Coverage: Nurse, mental health (medical)  [facility]  ([facility] [place] [facility] [address]  [address], [place] [address]  Phone:  [phone]  Fax:  [phone]  DEA #:  --  DAW Reason: --   Medication Adherence -Patient reports adherence with medications.  -Current diabetes medications include: Semglee 31 units daily, Novolog 150/50/5 plan (additional units on Monday (when she receives Solumedrol) -Prior diabetes medications include: none   Pre-pump Topics Insulin Pump Basics Sick Day Management Pump Failure Travel  Pump Start Instructions   Labs:    There were no vitals filed for this visit.  HbA1c Lab Results  Component Value Date   HGBA1C 8.6 (A) 04/09/2021   HGBA1C 8.9 (A) 07/31/2020   HGBA1C 9.1 (A) 01/17/2020    Pancreatic Islet Cell Autoantibodies No results found for: ISLETAB  Insulin Autoantibodies No results found for: INSULINAB  Glutamic Acid Decarboxylase Autoantibodies No results found for: GLUTAMICACAB  ZnT8 Autoantibodies No results found for: ZNT8AB  IA-2 Autoantibodies No  results found for: LABIA2  C-Peptide No results found for: CPEPTIDE  Microalbumin Lab Results  Component Value Date   MICRALBCREAT 22 06/23/2019    Lipids    Component Value Date/Time   CHOL 187 04/09/2021 1023   TRIG 73 04/09/2021 1023   HDL 71 04/09/2021 1023   CHOLHDL 2.6 04/09/2021 1023   VLDL 25 04/25/2008 0923   LDLCALC 100 (H) 04/09/2021 1023   LDLDIRECT 136.9 04/25/2008 0923    Assessment: Education - Thoroughly discussed all pre-pump topics (insulin pump basics, sick day management, pump failure, travel, and pump start instructions).   Pump Start Instructions - Sent prescription for Novolog vial to patient's preferred pharmacy. The patient/family understand that the family should bring all insulin pump supplies as well as insulin vial to pump start appointment. Advised patient to skip Semglee long acting insulin the day before pump start.   Travel Instructions - I educated patient to use immediate supply card (NovoNordisk). To use this card the basal insulin is [place] and bolus insulin is Novolog; will write prescriptions for both pens (immediate supply card allows for 1 box of basal / bolus pens each). Junious Silk to request a new prescription for each Tresiba/Novolog every year (prescriptions only active 1 year). She verbalized understanding.  Glucagon - She also reports having glucagon for emergency  kit (red kit) for emergencies. Will switch to Baqsimi. Counseled patient on administration; patient used teach back method to demonstrate understanding. She will inform her husband and family/friends on how to administer Baqsimi. Provided copay card for $25.  Plan: Pre-Pump Education Discussed all pre-pump topics (insulin pump basics, sick day management,  pump failure, travel, and pump start instructions) until family felt confident in their understanding of each topic.  Pump Start Appointment Sent prescription for Novolog vial to patient's preferred pharmacy.  The  patient/family understand that the family should bring all insulin pump supplies as well as insulin vial to pump start appointment.  Advised patient to skip long acting insulin on the day before pump start.  Travel Instructions:  Provided immediate supply card for Tresiba/Novolog (Novonordisk) Glucagon:  Will switch to Baqsimi Follow Up: 07/05/21 (if no insurance issues), 08/03/21 (if insurance issues)  Emailed patient instructions to [email or web address]  This appointment required 60 minutes of patient care (this includes precharting, chart review, review of results, face-to-face care, etc.).  Thank you for involving clinical pharmacist/diabetes educator to assist in providing this patient's care.  Drexel Iha, PharmD, BCACP, [place], CPP

## 2021-06-05 ENCOUNTER — Telehealth (INDEPENDENT_AMBULATORY_CARE_PROVIDER_SITE_OTHER): Payer: Self-pay | Admitting: "Endocrinology

## 2021-06-05 LAB — T3, FREE: T3, Free: 9.2 pg/mL — ABNORMAL HIGH (ref 2.3–4.2)

## 2021-06-05 LAB — THYROID STIMULATING IMMUNOGLOBULIN: TSI: <89 % baseline (ref <140)

## 2021-06-05 LAB — T4, FREE: Free T4: 2.6 ng/dL — ABNORMAL HIGH (ref 0.8–1.8)

## 2021-06-05 LAB — TSH: TSH: <0.01 mIU/L — ABNORMAL LOW

## 2021-06-05 NOTE — Telephone Encounter (Signed)
°  Who's calling (name and relationship to patient) :Jessica Kelley (Self)  Best contact number: [phone] (Mobile) Provider they see: Tobe Sos  Reason for call:  After getting blood work on 06/01/21, patient states that her tyroid levels are high. Please advise and or send whatever medication is needed . Contact patient with follow up questions   PRESCRIPTION REFILL ONLY  Name of prescription:  Pharmacy:

## 2021-06-06 ENCOUNTER — Telehealth: Payer: Self-pay

## 2021-06-06 DIAGNOSIS — E1065 Type 1 diabetes mellitus with hyperglycemia: Secondary | ICD-10-CM

## 2021-06-06 MED ORDER — METHIMAZOLE 10 MG PO TABS: ORAL_TABLET | ORAL | 2 refills | Status: DC

## 2021-06-06 NOTE — Telephone Encounter (Signed)
Returned call. LVM with call back number. Sent in prescription.

## 2021-06-08 ENCOUNTER — Other Ambulatory Visit (INDEPENDENT_AMBULATORY_CARE_PROVIDER_SITE_OTHER): Payer: Self-pay | Admitting: "Endocrinology

## 2021-06-08 ENCOUNTER — Telehealth (INDEPENDENT_AMBULATORY_CARE_PROVIDER_SITE_OTHER): Payer: Self-pay | Admitting: Pediatrics

## 2021-06-08 DIAGNOSIS — E1065 Type 1 diabetes mellitus with hyperglycemia: Secondary | ICD-10-CM

## 2021-06-08 NOTE — Telephone Encounter (Signed)
°  Who's calling (name and relationship to patient) : Fiza Nation; self  Best contact number: [phone]  Provider they see: Dr.  Gaynell Face  Reason for call: Xandra has called in to get a prior Authorization  on medication.     PRESCRIPTION REFILL ONLY  Name of prescription: Gammagard Pharmacy:

## 2021-06-08 NOTE — Telephone Encounter (Signed)
I called and left a message for [facility] requesting a call back. I also sent a message to [facility] to see what is needed for her to get the Gammagard. The last PA that I did should be effective until 10/01/21. TG

## 2021-06-11 ENCOUNTER — Telehealth (INDEPENDENT_AMBULATORY_CARE_PROVIDER_SITE_OTHER): Payer: Self-pay | Admitting: "Endocrinology

## 2021-06-11 NOTE — Telephone Encounter (Signed)
The Gammagard was approved by [facility]. I notified [name] of the outcome. TG

## 2021-06-11 NOTE — Telephone Encounter (Signed)
°  Who's calling (name and relationship to patient) : Raeden - self  Best contact number: [phone]  Provider they see: Dr. Tobe Sos  Reason for call: Would like Dexom sent to pharmacy now that her insurance has kicked in.    PRESCRIPTION REFILL ONLY  Name of prescription: Dexcom  Pharmacy: [facility] ([facility]), [place] [address]

## 2021-06-11 NOTE — Telephone Encounter (Signed)
Mattia called me back. She said that her insurance had changed and that is why she needs PA for Gammagard. She will send me a copy of her new card so that I can initiate the PA. TG

## 2021-06-12 MED ORDER — DEXCOM G6 TRANSMITTER MISC: 3 refills | Status: DC

## 2021-06-12 MED ORDER — DEXCOM G6 SENSOR MISC: 3 refills | Status: DC

## 2021-06-12 NOTE — Telephone Encounter (Signed)
Sent supplies to [facility]

## 2021-06-13 ENCOUNTER — Ambulatory Visit (INDEPENDENT_AMBULATORY_CARE_PROVIDER_SITE_OTHER): Payer: BC Managed Care – PPO | Admitting: "Endocrinology

## 2021-06-15 ENCOUNTER — Telehealth (INDEPENDENT_AMBULATORY_CARE_PROVIDER_SITE_OTHER): Payer: Self-pay | Admitting: "Endocrinology

## 2021-06-15 NOTE — Telephone Encounter (Signed)
°  Who's calling (name and relationship to patient) :Self/ Jessica Kelley   Best contact number:[phone]  Provider they see:Dr. Tobe Sos   Reason for call:patient called requesting a call back regarding questions about medication.      PRESCRIPTION REFILL ONLY  Name of prescription:  Pharmacy:

## 2021-06-18 NOTE — Telephone Encounter (Signed)
Pt was told to re-start methimazole at 10 mg, three times daily.  Pt wants to know if she still needs to take the labetalol  200 MG tablet  as well?  This message has been forwarded to Dr Tobe Sos

## 2021-06-18 NOTE — Telephone Encounter (Signed)
Spoke with Jessica Kelley and relayed Dr Alfonse Spruce message to only take the [place] if she is having symptoms. Pt stated understanding and no further questions.

## 2021-06-18 NOTE — Telephone Encounter (Signed)
Patient is looking for a return call ASAP

## 2021-06-22 ENCOUNTER — Telehealth (INDEPENDENT_AMBULATORY_CARE_PROVIDER_SITE_OTHER): Payer: Self-pay | Admitting: "Endocrinology

## 2021-06-22 ENCOUNTER — Encounter (INDEPENDENT_AMBULATORY_CARE_PROVIDER_SITE_OTHER): Payer: Self-pay

## 2021-06-22 NOTE — Telephone Encounter (Signed)
We have not received any faxed from them. I sent Jessica Kelley a mychart message asking if she knew this company would be reaching out regarding how long she had been on IVIG. We would need a release to be able to speak with them.

## 2021-06-22 NOTE — Telephone Encounter (Signed)
°  Who's calling (name and relationship to patient) : Sharyn Lull; [facility] contact number: [phone]  Provider they see: Dr. Tobe Sos  Reason for call: Sharyn Lull wants to confirm how long she has been on IVIG, or  if this is something that she just started. Sharyn Lull stated that there has been attempts to collect this info by sending faxes, she has to know before noon.    PRESCRIPTION REFILL ONLY  Name of prescription:  Pharmacy:

## 2021-06-25 ENCOUNTER — Telehealth (INDEPENDENT_AMBULATORY_CARE_PROVIDER_SITE_OTHER): Payer: Self-pay | Admitting: Pediatrics

## 2021-06-25 ENCOUNTER — Telehealth (INDEPENDENT_AMBULATORY_CARE_PROVIDER_SITE_OTHER): Payer: Self-pay | Admitting: "Endocrinology

## 2021-06-25 NOTE — Telephone Encounter (Signed)
Who's calling (name and relationship to patient) : Derrill Center  Best contact number: [phone]  Provider they see: Dr. Gaynell Face  Reason for call: Wants to know if questionnaire was filled out. Needs this to complete PA   Call ID:      PRESCRIPTION REFILL ONLY  Name of prescription:  Pharmacy:

## 2021-06-25 NOTE — Telephone Encounter (Signed)
Who's calling (name and relationship to patient) : Aurelio Jew   Best contact number: [phone]  Provider they see: Tobe Sos  Reason for call: Needs a PA for dexcom   Call ID:      PRESCRIPTION REFILL ONLY  Name of prescription:  Pharmacy:

## 2021-06-25 NOTE — Telephone Encounter (Signed)
I called and spoke with Jessica Kelley at [employer]. This is about the IVIG (Gammagard infusion) that [name] receives. I did not receive the form that she is referencing. She will ask the insurance to send it again. Please give any forms or messages regarding this drug to me. TG

## 2021-06-26 NOTE — Telephone Encounter (Signed)
I called and spoke with Sharyn Lull with [facility]. The infusion was approved for 1 year. TG

## 2021-06-26 NOTE — Telephone Encounter (Signed)
I called Arts development officer at [employer]. The IVIG infusion was approved for 1 year. TG

## 2021-06-26 NOTE — Telephone Encounter (Signed)
I called and spoke with Sharyn Lull at [facility]. The infusion was approved for 1 year. TG

## 2021-06-27 ENCOUNTER — Encounter (INDEPENDENT_AMBULATORY_CARE_PROVIDER_SITE_OTHER): Payer: Self-pay

## 2021-06-27 ENCOUNTER — Ambulatory Visit (INDEPENDENT_AMBULATORY_CARE_PROVIDER_SITE_OTHER): Payer: BC Managed Care – PPO | Admitting: "Endocrinology

## 2021-07-05 ENCOUNTER — Other Ambulatory Visit (INDEPENDENT_AMBULATORY_CARE_PROVIDER_SITE_OTHER): Payer: BC Managed Care – PPO | Admitting: Pharmacist

## 2021-07-24 ENCOUNTER — Other Ambulatory Visit (INDEPENDENT_AMBULATORY_CARE_PROVIDER_SITE_OTHER): Payer: Self-pay | Admitting: "Endocrinology

## 2021-07-24 ENCOUNTER — Other Ambulatory Visit (INDEPENDENT_AMBULATORY_CARE_PROVIDER_SITE_OTHER): Payer: Self-pay | Admitting: Pharmacist

## 2021-07-24 DIAGNOSIS — E1065 Type 1 diabetes mellitus with hyperglycemia: Secondary | ICD-10-CM

## 2021-07-24 MED ORDER — INSULIN LISPRO (1 UNIT DIAL) 100 UNIT/ML (KWIKPEN): PEN_INJECTOR | SUBCUTANEOUS | 3 refills | Status: DC

## 2021-07-24 MED ORDER — INSULIN LISPRO 100 UNIT/ML ~~LOC~~ SOLN: SUBCUTANEOUS | 5 refills | Status: DC

## 2021-07-27 ENCOUNTER — Ambulatory Visit (INDEPENDENT_AMBULATORY_CARE_PROVIDER_SITE_OTHER): Payer: BC Managed Care – PPO | Admitting: "Endocrinology

## 2021-07-30 ENCOUNTER — Telehealth (INDEPENDENT_AMBULATORY_CARE_PROVIDER_SITE_OTHER): Payer: Self-pay | Admitting: Pharmacist

## 2021-07-30 NOTE — Telephone Encounter (Signed)
Needs to RS tandem pump start. Needs assistance into appt and thought it was tomorrow morning not tomorrow afternoon.  ?

## 2021-07-31 ENCOUNTER — Telehealth (INDEPENDENT_AMBULATORY_CARE_PROVIDER_SITE_OTHER): Payer: Self-pay | Admitting: "Endocrinology

## 2021-07-31 ENCOUNTER — Other Ambulatory Visit (INDEPENDENT_AMBULATORY_CARE_PROVIDER_SITE_OTHER): Payer: BC Managed Care – PPO | Admitting: Pharmacist

## 2021-07-31 DIAGNOSIS — E0500 Thyrotoxicosis with diffuse goiter without thyrotoxic crisis or storm: Secondary | ICD-10-CM

## 2021-07-31 MED ORDER — PROPYLTHIOURACIL 50 MG PO TABS
50.0000 mg | ORAL_TABLET | Freq: Three times a day (TID) | ORAL | 5 refills | Status: DC
Start: 2021-07-31 — End: 2021-08-01

## 2021-07-31 NOTE — Telephone Encounter (Signed)
?  Who's calling (name and relationship to patient) : Jessica Kelley; self ? ?Best contact number: ?[phone] ? ?Provider they see: ?Dr. Tobe Sos ? ?Reason for call: ?Jessica Kelley has called in wanting to leave a message for Dr. Tobe Sos; stating that she is pregnant and needs to switch medicine. She needs Methimazole switched to Peu. ? ? ? ?PRESCRIPTION REFILL ONLY ? ?Name of prescription: ? ?Pharmacy: ? ? ?

## 2021-07-31 NOTE — Telephone Encounter (Signed)
?  Who's calling (name and relationship to patient) :[place] ? ?Best contact number:[phone]  ? ?Provider they see:Dr. Tobe Sos  ? ?Reason for call:needs medication clarification. Pharmacy stated that two different directions for the medication were sent   ? ? ? ? ?PRESCRIPTION REFILL ONLY ? ?Name of prescription:Propylthiouracil  ? ?Pharmacy:[facility]  ? ? ?

## 2021-07-31 NOTE — Telephone Encounter (Signed)
Graziella called. She just discovered that she is about [redacted] weeks pregnant. BGs have been very good for several weeks. She is also taking methimazole, 10 mg, tid. She feels back to normal, not hyper or hypo.  ?She can come in for lab tests on Thursday afternoon, 08/02/21. ?Since the usual ration of PTU:MTZ in 100/5. I will convert her to 200 mg of PTU, three times daily = four 50 mg tablets three time daily = 600 mg/day. ?After reviewing her TFTs from 08/02/21 we can fine tune the PTU dose better.  ?We will repeat her labs again in one month. ?Tillman Sers, MD, CDCES ? ? ?

## 2021-08-01 MED ORDER — PROPYLTHIOURACIL 50 MG PO TABS: ORAL_TABLET | ORAL | 5 refills | Status: DC

## 2021-08-01 NOTE — Telephone Encounter (Signed)
Resent prescription

## 2021-08-01 NOTE — Telephone Encounter (Signed)
?  Who's calling (name and relationship to patient) :[facility]  ? ?Best contact number:[phone]  ? ?Provider they see:Dr. Tobe Sos ? ?Reason for call:following up on the medication clarification they needed yesterday. ? ? ? ? ?PRESCRIPTION REFILL ONLY ? ?Name of prescription: ? ?Pharmacy: ? ? ?

## 2021-08-02 NOTE — Telephone Encounter (Signed)
Scheduled pt for 08/31/21 8:30 am ?

## 2021-08-07 ENCOUNTER — Other Ambulatory Visit: Payer: Self-pay | Admitting: Obstetrics & Gynecology

## 2021-08-07 DIAGNOSIS — O0990 Supervision of high risk pregnancy, unspecified, unspecified trimester: Secondary | ICD-10-CM

## 2021-08-07 DIAGNOSIS — Z363 Encounter for antenatal screening for malformations: Secondary | ICD-10-CM

## 2021-08-07 DIAGNOSIS — E0821 Diabetes mellitus due to underlying condition with diabetic nephropathy: Secondary | ICD-10-CM

## 2021-08-07 LAB — CBC WITH DIFFERENTIAL/PLATELET
Absolute Monocytes: 469 cells/uL (ref 200–950)
Basophils Absolute: 60 cells/uL (ref 0–200)
Basophils Relative: 1.3 %
Eosinophils Absolute: 28 cells/uL (ref 15–500)
Eosinophils Relative: 0.6 %
HCT: 43.1 % (ref 35.0–45.0)
Hemoglobin: 13.8 g/dL (ref 11.7–15.5)
Lymphs Abs: 1205 cells/uL (ref 850–3900)
MCH: 27.5 pg (ref 27.0–33.0)
MCHC: 32 g/dL (ref 32.0–36.0)
MCV: 86 fL (ref 80.0–100.0)
MPV: 12.7 fL — ABNORMAL HIGH (ref 7.5–12.5)
Monocytes Relative: 10.2 %
Neutro Abs: 2838 cells/uL (ref 1500–7800)
Neutrophils Relative %: 61.7 %
Platelets: 221 10*3/uL (ref 140–400)
RBC: 5.01 10*6/uL (ref 3.80–5.10)
RDW: 12.9 % (ref 11.0–15.0)
Total Lymphocyte: 26.2 %
WBC: 4.6 10*3/uL (ref 3.8–10.8)

## 2021-08-07 LAB — COMPREHENSIVE METABOLIC PANEL
AG Ratio: 0.7 (calc) — ABNORMAL LOW (ref 1.0–2.5)
ALT: 16 U/L (ref 6–29)
AST: 27 U/L (ref 10–30)
Albumin: 3.6 g/dL (ref 3.6–5.1)
Alkaline phosphatase (APISO): 79 U/L (ref 31–125)
BUN: 14 mg/dL (ref 7–25)
CO2: 23 mmol/L (ref 20–32)
Calcium: 9.5 mg/dL (ref 8.6–10.2)
Chloride: 99 mmol/L (ref 98–110)
Creat: 0.63 mg/dL (ref 0.50–0.96)
Globulin: 5.3 g/dL (calc) — ABNORMAL HIGH (ref 1.9–3.7)
Glucose, Bld: 139 mg/dL (ref 65–139)
Potassium: 4.2 mmol/L (ref 3.5–5.3)
Sodium: 133 mmol/L — ABNORMAL LOW (ref 135–146)
Total Bilirubin: 0.4 mg/dL (ref 0.2–1.2)
Total Protein: 8.9 g/dL — ABNORMAL HIGH (ref 6.1–8.1)

## 2021-08-07 LAB — T4, FREE: Free T4: 0.6 ng/dL — ABNORMAL LOW (ref 0.8–1.8)

## 2021-08-07 LAB — TSH: TSH: 0.8 mIU/L

## 2021-08-07 LAB — THYROID STIMULATING IMMUNOGLOBULIN: TSI: <89 % baseline (ref <140)

## 2021-08-07 LAB — T3, FREE: T3, Free: 2.8 pg/mL (ref 2.3–4.2)

## 2021-08-08 ENCOUNTER — Other Ambulatory Visit: Payer: Self-pay | Admitting: Obstetrics & Gynecology

## 2021-08-08 DIAGNOSIS — E0821 Diabetes mellitus due to underlying condition with diabetic nephropathy: Secondary | ICD-10-CM

## 2021-08-08 DIAGNOSIS — O0990 Supervision of high risk pregnancy, unspecified, unspecified trimester: Secondary | ICD-10-CM

## 2021-08-08 DIAGNOSIS — Z363 Encounter for antenatal screening for malformations: Secondary | ICD-10-CM

## 2021-08-13 ENCOUNTER — Telehealth (INDEPENDENT_AMBULATORY_CARE_PROVIDER_SITE_OTHER): Payer: Self-pay | Admitting: Family

## 2021-08-13 NOTE — Telephone Encounter (Signed)
?  Name of who is calling: ?Julie-Ann ?Caller's Relationship to Patient: ?Self  ?Best contact number: ?[phone] ?Provider they see: ?Otila Kluver ?Reason for call: ? ?Patient called to follow up on e-mail sent by provider. Please have tina contact when able.  ? ? ?PRESCRIPTION REFILL ONLY ? ?Name of prescription: ? ?Pharmacy: ? ? ?

## 2021-08-13 NOTE — Telephone Encounter (Signed)
I called and scheduled visit with me on Friday March 31st so that I can order the IVIG for [facility]. TG ?

## 2021-08-15 ENCOUNTER — Encounter: Payer: Self-pay | Admitting: *Deleted

## 2021-08-17 ENCOUNTER — Ambulatory Visit: Payer: BC Managed Care – PPO | Attending: Obstetrics & Gynecology | Admitting: *Deleted

## 2021-08-17 ENCOUNTER — Ambulatory Visit (HOSPITAL_BASED_OUTPATIENT_CLINIC_OR_DEPARTMENT_OTHER): Payer: BC Managed Care – PPO

## 2021-08-17 ENCOUNTER — Encounter (INDEPENDENT_AMBULATORY_CARE_PROVIDER_SITE_OTHER): Payer: Self-pay | Admitting: Family

## 2021-08-17 ENCOUNTER — Ambulatory Visit (HOSPITAL_BASED_OUTPATIENT_CLINIC_OR_DEPARTMENT_OTHER): Payer: BC Managed Care – PPO | Admitting: Obstetrics

## 2021-08-17 ENCOUNTER — Other Ambulatory Visit: Payer: Self-pay | Admitting: *Deleted

## 2021-08-17 ENCOUNTER — Ambulatory Visit (INDEPENDENT_AMBULATORY_CARE_PROVIDER_SITE_OTHER): Payer: BC Managed Care – PPO | Admitting: Family

## 2021-08-17 VITALS — BP 114/80 | HR 94 | Wt 151.0 lb

## 2021-08-17 VITALS — BP 126/92 | HR 112

## 2021-08-17 DIAGNOSIS — Z363 Encounter for antenatal screening for malformations: Secondary | ICD-10-CM | POA: Insufficient documentation

## 2021-08-17 DIAGNOSIS — O24011 Pre-existing diabetes mellitus, type 1, in pregnancy, first trimester: Secondary | ICD-10-CM | POA: Diagnosis present

## 2021-08-17 DIAGNOSIS — O99281 Endocrine, nutritional and metabolic diseases complicating pregnancy, first trimester: Secondary | ICD-10-CM

## 2021-08-17 DIAGNOSIS — O0990 Supervision of high risk pregnancy, unspecified, unspecified trimester: Secondary | ICD-10-CM

## 2021-08-17 DIAGNOSIS — E1069 Type 1 diabetes mellitus with other specified complication: Secondary | ICD-10-CM

## 2021-08-17 DIAGNOSIS — O10011 Pre-existing essential hypertension complicating pregnancy, first trimester: Secondary | ICD-10-CM | POA: Diagnosis not present

## 2021-08-17 DIAGNOSIS — Z79899 Other long term (current) drug therapy: Secondary | ICD-10-CM | POA: Diagnosis not present

## 2021-08-17 DIAGNOSIS — Z3A01 Less than 8 weeks gestation of pregnancy: Secondary | ICD-10-CM | POA: Insufficient documentation

## 2021-08-17 DIAGNOSIS — Z3491 Encounter for supervision of normal pregnancy, unspecified, first trimester: Secondary | ICD-10-CM

## 2021-08-17 DIAGNOSIS — G6181 Chronic inflammatory demyelinating polyneuritis: Secondary | ICD-10-CM

## 2021-08-17 DIAGNOSIS — E0500 Thyrotoxicosis with diffuse goiter without thyrotoxic crisis or storm: Secondary | ICD-10-CM | POA: Insufficient documentation

## 2021-08-17 DIAGNOSIS — O99351 Diseases of the nervous system complicating pregnancy, first trimester: Secondary | ICD-10-CM

## 2021-08-17 DIAGNOSIS — E0821 Diabetes mellitus due to underlying condition with diabetic nephropathy: Secondary | ICD-10-CM

## 2021-08-17 DIAGNOSIS — O24911 Unspecified diabetes mellitus in pregnancy, first trimester: Secondary | ICD-10-CM

## 2021-08-17 DIAGNOSIS — E0590 Thyrotoxicosis, unspecified without thyrotoxic crisis or storm: Secondary | ICD-10-CM

## 2021-08-17 NOTE — Patient Instructions (Signed)
It was a pleasure to see you today! ? ?Instructions for you until your next appointment are as follows: ?I will sign forms for you to receive the IVIG treatments until you can see Dr Tillman Abide in May.  ?Be sure to keep May 30th appointment with Dr Tillman Abide ?Call me if you have any questions or concerns until you see him. ?Please sign up for MyChart if you have not done so. ?  ?Feel free to contact our office during normal business hours at [phone] with questions or concerns. If there is no answer or the call is outside business hours, please leave a message and our clinic staff will call you back within the next business day.  If you have an urgent concern, please stay on the line for our after-hours answering service and ask for the on-call neurologist.   ?  ?I also encourage you to use MyChart to communicate with me more directly. If you have not yet signed up for MyChart within [facility], the front desk staff can help you. However, please note that this inbox is NOT monitored on nights or weekends, and response can take up to 2 business days.  Urgent matters should be discussed with the on-call pediatric neurologist.  ? ?At [facility], we are committed to providing exceptional care. You will receive a patient satisfaction survey through text or email regarding your visit today. Your opinion is important to me. Comments are appreciated.   ?

## 2021-08-20 NOTE — Progress Notes (Signed)
MFM Note ? 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  She currently has a Dexcom continuous glucose monitor in place.  She will have an insulin pump placed for glycemic control soon.  She reports that her most recent hemoglobin A1c was 7.2%.  Her most recent serum creatinine level was 0.63. ? ?For treatment of hyperthyroidism, she is taking 4 tablets of PTU 3 times daily.  Her most recent TSH level drawn 2 weeks ago was within normal limits.  Her free T4 and free T3 levels drawn 2 weeks ago were normal to low normal.  She is scheduled to see her endocrinologist on August 28, 2021. ? ?For treatment of CIDP, she receives 50 g of IVIG twice a week along with IV Solu-Medrol 500 mg once a week.  She remains wheelchair-bound due to weakness in her legs, although she is able to stand on her own.  She reports that she probably would not be able to perform daily functions without the IVIG and Solu-Medrol treatments. ? ?Her current medications include labetalol 200 mg daily for heart rate control probably due to her hyperthyroidism, PTU, Lantus and Humalog insulin, Prometrium, IVIG, Solu-Medrol, and prenatal vitamins. ? ?She reports that she probably had 2 first trimester miscarriages within the past year. ? ?An ultrasound performed today shows a viable singleton intrauterine pregnancy with a crown-rump length that was consistent with an EDC of April 11, 2022, making her 6 weeks and 1 day pregnant. ? ?The following were discussed during our consultation today: ? ?Pregestational diabetes and pregnancy ? ?The patient was advised that our goals for blood glucose levels are a fasting level of 90-95 or less and 2 hours postprandial of 120 or less.   ? ?She was advised to continue to monitor her blood glucose levels using the Dexcom monitor throughout her pregnancy and to get  the insulin pump placed as soon as possible.   ? ?The patient was advised that many patients with longstanding pregestational diabetes may have a difficult time maintaining tight glucose control throughout pregnancy.  She was advised to do her best to get her blood glucose levels as close to the goals noted above.   ? ?The increased risk of fetal congenital anomalies associated with pregestational diabetes was discussed.   ? ?The increased risk of preeclampsia in women with pregestational diabetes was also discussed. ? ?A first trimester fetal anatomy scan has been scheduled for her in our office in 5 weeks (at 11 weeks).  She will have a detailed fetal anatomy scan performed in our office at around 19 weeks.  We will refer her to pediatric cardiology for a fetal echocardiogram at around 21 to 22 weeks.  ?  ?We will then continue to follow her with monthly growth ultrasounds.   ? ?Twice weekly fetal testing should be started at around 32 weeks. ? ?She should start taking a daily baby aspirin (81 mg daily) starting at 10 to 12 weeks for preeclampsia prophylaxis.   ? ?She should have a baseline P/C ratio obtained in the first trimester. ? ?Hyperthyroidism in pregnancy ? ?The implications and management of hyperthyroidism in pregnancy was discussed with the patient.   ? ?She was advised that uncontrolled hyperthyroidism is associated with significant adverse pregnancy outcomes in the mother such as the development of preeclampsia and thyroid storm.   ? ?In the fetus, uncontrolled hyperthyroidism may result in  fetal tachycardia, fetal growth restriction, and stillbirths.  ? ?The patient was advised to continue taking PTU as recommended by her endocrinologist for the duration of pregnancy.   ? ?She should have her thyroid function tests monitored at least once every trimester.   ? ?The dose of PTU should be adjusted based on her thyroid function tests.   ? ?The patient was advised that maintaining normal thyroid function  throughout her pregnancy will provide her with the most optimal obstetrical outcomes. ? 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 ?As the patient states that she probably cannot perform daily functions without the IVIG and Solu-Medrol treatments, she was advised to continue the IVIG and Solu-Medrol treatments throughout her pregnancy as the benefits most likely outweighs the risks. ? ?She understands that the Solu-Medrol may increase her blood glucose levels making her diabetes even harder to control during pregnancy.  The lowest possible dose of Solu-Medrol should be used. ? ?As it may be difficult for her to open her legs and hips for a vaginal delivery, the patient reports that she will request a cesarean delivery.  This may be performed at between 37 to 39 weeks depending on her glycemic control.  ?  ?As she will be wheelchair-bound following her cesarean delivery, she should be placed on prophylactic Lovenox for 6 weeks postpartum for DVT prophylaxis.  ? ?She should have an anesthesia consult prior to delivery to determine if she would be eligible for regional anesthesia.   ? ?Due to her history of glaucoma, she should have an eye exam performed at some point during her pregnancy. ? ?A follow-up exam for first trimester fetal anatomy scan scheduled in our office in 5 weeks.  She can have a cell free DNA test to screen for fetal aneuploidy drawn at that time.   ? ?We will continue to follow her closely with you throughout her pregnancy. ? ?The patient stated that all of her questions were answered to her complete satisfaction.  ? ?A total of 45 minutes was spent counseling and coordinating the care for this patient. ? ?Recommendations: ?Continue insulin pump and Dexcom continuous glucose monitor ?Continue weekly IVIG and Solu-Medrol treatments ?Continue PTU for treatment of hyperthyroidism ?Baseline P/C ratio ?Eye exam during her pregnancy ?Start daily baby aspirin (81 mg) daily for 10 to 12 weeks for preeclampsia prophylaxis ?Cell  free DNA test to be drawn at 10 to 11 weeks ?First trimester fetal anatomy scan  ?Detailed fetal anatomy scan at 20 weeks ?Fetal echocardiogram ?Monthly growth ultrasounds ?Twice-weekly fetal testing starting at 32 weeks ?Anesthesia consult prior to delivery ?Cesarean delivery to be scheduled at between 37 to 39 weeks depending on level of glycemic control ?Prophylactic Lovenox for 6 weeks postpartum ?

## 2021-08-23 ENCOUNTER — Telehealth: Payer: Self-pay

## 2021-08-25 ENCOUNTER — Encounter (INDEPENDENT_AMBULATORY_CARE_PROVIDER_SITE_OTHER): Payer: Self-pay | Admitting: Family

## 2021-08-25 DIAGNOSIS — Z3491 Encounter for supervision of normal pregnancy, unspecified, first trimester: Secondary | ICD-10-CM

## 2021-08-25 HISTORY — DX: Encounter for supervision of normal pregnancy, unspecified, first trimester: Z34.91

## 2021-08-25 NOTE — Progress Notes (Signed)
? ?Jessica Kelley   ?MRN:  [id number]  ?26-Feb-1991  ? ?Provider: Rockwell Germany NP-C ?Location of Care: [facility] ? ?Visit type: Urgent return visit ? ?Last visit: 06/02/2020 with Dr Gaynell Face ? ?Referral source: Garnet Koyanagi, DO ?History from: Epic chart, patient and her husband ? 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  She was followed by [facility] for a number of years and responded to immunotherapy with IVIG and Solu-Medrol which stabilized her strength.  She has been on a number of other immunosuppressant medications. ?  ?She was seen by Dr. Gustavus Bryant at [facility].  His impressions are in the past medical history. She will transition all care to Dr Tillman Abide now that Dr Gaynell Face has retired.  ?  ?She suffered a closed head injury January 31, 2019 causing a left occipital skull fracture and subdural hematoma.  Fortunately she has not had any further falls. ?  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  She had iridectomy and surgery for glaucoma.  Is not possible to correct her vision.  She has regular ophthalmologic evaluation. ?  ?She is followed by Dr. 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 ? ?Today's concerns: ?Jessica Kelley is seen today in follow up because she needs orders signed to continue to receive IVIG treatments until she transitions care to Dr Tillman Abide in late May 2023. Se tells me today that she has been doing well and has recently found out that she is pregnant. She was seen earlier today by the high risk pregnancy obstetrician.  ? ?Jessica Kelley tells me that there is no change in her strength. 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  ? ?Jessica Kelley has been otherwise generally healthy since she was last seen. She has no other health concerns today other than previously mentioned. ? ?Review of systems: ?Please see HPI for neurologic and other pertinent review of systems. Otherwise all other systems were reviewed and were negative. ? ?Problem List: ?Patient Active Problem List  ? Diagnosis Date Noted  ? Subdural hematoma due to concussion ([place]) 03/14/2020  ? Obsessive-compulsive disorder 03/14/2020  ? Strep throat 10/16/2017  ? Chest pain 06/12/2016  ? Palpitations 06/12/2016  ? Inappropriate sinus tachycardia 07/12/2013  ? Abnormality of gait 11/09/2012  ? Migraine without aura 11/09/2012  ? Chronic tension type headache 11/09/2012  ? Insomnia, unspecified 11/09/2012  ? Chronic lymphocytic thyroiditis 11/09/2012  ? 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 Encounter for long-term (current) use of other medications 11/09/2012  ? Thyrotoxicosis with diffuse goiter 02/25/2012  ? Thyroiditis, autoimmune 02/25/2012  ? 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 Goiter 02/25/2012  ? Pilonidal cyst with abscess 12/12/2010  ? UNSPECIFIED ANEMIA 03/27/2009  ? VIRAL URI 06/14/2008  ? ELEVATED BLOOD PRESSURE WITHOUT DIAGNOSIS OF HYPERTENSION 05/11/2008  ? KNEE PAIN, RIGHT 01/26/2008  ? UNSPECIFIED TACHYCARDIA 12/07/2007  ? HEADACHE 06/04/2007  ? HYPOTHYROIDISM NOS 02/17/2007  ? FOOT PAIN, RIGHT 02/17/2007  ? SCOLIOSIS NEC 02/17/2007  ? DIABETES MELLITUS, TYPE I 10/07/2006  ? CIDP (chronic inflammatory demyelinating polyneuropathy) ([place]) 10/07/2006  ? MACULAR DEGENERATION 10/07/2006  ? DISEASE, CELIAC 10/07/2006  ?  ? ?Past Medical History:  ?Diagnosis Date  ? Brain bleed ([place])   ?  Patient stated  ? Celiac disease   ? CIDP (chronic inflammatory demyelinating polyneuropathy) ([place]) 2001  ? Diabetes type I ([place])   ? Dr. Tobe Sos  ? Glaucoma   ? Hyperthyroidism   ? Macular degeneration   ? Pilonidal cyst   ?  Scoliosis   ? Wyburn-Mason syndrome   ?  ?Past medical history comments: See HPI ?Copied from previous record: ?Detailed information from previous evaluations at [facility] are in the media section and from [place] and [facility] in [place]. ?  ?CT scan of the brain May 03, 2012 was negative ?  ?MRI of the pelvis at [facility] October 25, 2014 showed stable thickening and increased T2 hyperintensity within the exiting nerve roots and lumbosacral plexus and sciatic nerves.  Thickening and increased T2 intensity of the left lateral femoral cutaneous and intrapelvic femoral nerves was less conspicuous. ?  ?Dr. Gustavus Bryant, [facility] neurology has seen her.  There was an initial consultation for this condition on March 31, 2018.  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  Despite this Dr. 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  ?  ?She had a closed head injury January 31, 2019 when she lost balance and fell striking her head causing a left occipital skull fracture and subdural hematoma.  MRI scan February 07, 2019 showed a 7 mm right cerebral convexity hematoma that had increased from 4 to 5 mm.  She also had some parenchymal hematomas in the right greater than left gyri recti with surrounding vasogenic edema, extra-axial include collections in the floor of the anterior cranial fossa anterior aspect of the right temporal lobe and posterior margin of the clivus, and left sphenoid sinus.  She was out of work for 2 months.  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 ?  ?Birth History ?7 pound 9 ounce infant born at full-term.   ?Gestation was uncomplicated.   ?Labor lasted 23 hours.   ?Operative delivery with forceps.    ?Nursery course was uneventful.   ?Growth and development is recalled as normal. ? ?Surgical history: ?Past Surgical History:  ?Procedure Laterality Date  ? CATARACT EXTRACTION  06/25/06  ? FOOT ARTHROPLASTY    ? left foot moved and stretched tendons   ? GLAUCOMA SURGERY  02/12/06  ? HIP ARTHROPLASTY    ? right never bipopsy  ? PORTACATH PLACEMENT  2002-2004  ? removal of vascular port    ? Sciatic Nerve Muscle Biopsy    ?  ?Family history: ?family history includes Cancer in her maternal grandfather and paternal grandmother; Coronary artery disease in her paternal grandfather; Diabetes in her sister; Hyperlipidemia in an other family member; Other in her sister; Prostate cancer in her maternal grandfather; Thyroid disease in her mother.  ? ?Social history: ?Social History  ? ?Socioeconomic History  ? Marital status: Single  ?  Spouse name: Not on file  ? Number of children: Not on file  ? Years of education: Not on file  ? Highest education level: Not on file  ?Occupational History  ? Not on file  ?Tobacco Use  ? Smoking status: Never  ? Smokeless tobacco: Never  ? Tobacco comments:  ?  passive smoke exposure  ?Vaping Use  ?  Vaping Use: Never used  ?Substance and Sexual Activity  ? Alcohol use: Yes  ?  Alcohol/week: 0.0 standard drinks  ?  Comment: OCC  ? Drug use: No  ? Sexual activity: Not on file  ?Other Topics Concern  ? Not on file  ?Social History Narrative  ? Jahmiyah is a 31 yo woman   ? She is employed with [employer].  ? She lives with her husband and has one sister, 11 yo.  ? She enjoys working, camping, and walking outside.  ? ?Social Determinants of Health  ? ?Financial Resource Strain: Not on file  ?Food Insecurity: Not on file  ?Transportation Needs: Not on file  ?Physical Activity: Not on file  ?Stress: Not on file  ?Social Connections: Not on file  ?Intimate Partner Violence: Not on file  ?  ?Past/failed meds: ? ?Allergies: ?Allergies  ?Allergen Reactions  ? Gluten Meal   ? Wheat Bran   ?  Ginger Rash  ?  ?Immunizations: ?Immunization History  ?Administered Date(s) Administered  ? DTP 05/09/1993, 07/11/1993, 09/26/1993, 10/21/1994, 07/28/1998  ? H1N1 03/23/2008  ? Hepatitis A 03/27/2009, 02/

## 2021-08-27 NOTE — Progress Notes (Signed)
CHIEF COMPLAINT: Renn presents for follow-up of type 1 diabetes mellitus, hypoglycemia, autonomic neuropathy, inappropriate sinus tachycardia, thyrotoxic diffuse goiter secondary to Graves' disease, hypothyroidism and goiter secondary to Hashimoto's thyroiditis, celiac disease, chronic inflammatory demyelinating polyneuropathy (CIDP), muscle weakness, limited endurance, hyperkalemia, hypertension, anemia, iron deficiency, and fatigue. ? ?HISTORY OF PRESENT ILLNESS: The patient is a 31 year-old Caucasian young woman. She was accompanied by her mother today.  ? ?1. This 66 year-old Caucasian young woman with an already complicated medical history was first referred to me on 09/25/06 by her primary care provider, Dr. Garnet Koyanagi, for evaluation and management of type I diabetes mellitus, hypoglycemia, and a myriad of other autoimmune issues.  ? A. CIDP: At about age 30 the child began to limp.  At age 36 she was evaluated at [facility] and diagnosed with CIDP. At the time I first met her she was under the care of Dr. Marcene Brawn of [facility] in [place]. She was on a regular regimen of monthly intravenous treatments at home with IVIG and steroids on the days of therapy. She was also taking cyclosporine A daily, with alternating doses every other day. Because of the CIDP she had  significant problems with muscle atrophy and weakness, as well as multiple orthopedic issues. She was also being followed by Dr. Simonne Come, an orthopedist in [place]. On that first visit her legs and arms were quite weak. She found it very hard to walk or to exercise. ? B. Type 1 diabetes mellitus: Her diabetes mellitus was also diagnosed at age 33, just prior to the diagnosis of CIDP. She had been started on a Medtronic Paradigm 712 insulin pump subsequently and was using Humalog lispro insulin in her pump. She had a very difficult time with blood glucose control. At the time of her steroids, her sugars tended  to run very high. However, when she was able to perform some physical activities, she tended to develop hypoglycemia. Her hemoglobin A1c on that first visit was 9.5%. ? C. Celiac disease: The patient was diagnosed with celiac disease at approximately age 7. She had been on a gluten-free diet ever since. ? 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 She was followed both at [facility] and at the [facility] eye clinic. ? ?2. Since that initial visit with me, I have followed her for the problems listed above plus other new problems that have developed over time.  ? A. CIDP: Her CIDP course has waxed and waned. At one point she had improved significantly, her medication doses had been reduced, she felt very much stronger, had more stamina, and was doing very well. Unfortunately, she later had a significant relapse of CIDP and lost much of the improvement that she had gained. Since going back to [facility] in the Spring of 2013, however, and starting a new and more intensified IVIG regimen, her strength had improved. She was still very weak physically and muscularly, however, and needed support to walk. As noted below, she gets stronger when her steroids are increased and weaker when the steroids are tapered. In the past  years Dr. Wyline Copas, MD, our senior pediatric neurologist, had assumed the role of managing her CIDP. More recently, however, she has been seeing Dr. Gustavus Bryant at [facility]. ? B. Type 1 diabetes mellitus: The patient's blood sugars have varied significantly with the course of her CIDP  and the treatments for that disease. Her hemoglobin A1c values have ranged from a low of 8.3% to a high of 14.0%. Most of her A1c values have been in the 8.4-10.1% range.  ? C. Celiac disease: The patient has really done quite well over the years. She rarely has had signs or  symptoms of abdominal problems. Because her antibody levels fluctuated a great deal, her celiac disease antibodies were negative in March 2012. She was mistakenly told that she did not have celiac disease and that she could resume a normal diet. As expected, resumption of a normal diet caused a recurrence of active celiac disease. In October 2012 she saw a gastroenterologist, Dr. Gale Journey, at [facility] who told her that she did have celiac disease. She is now back on her gluten-free diet almost all the time and is asymptomatic again. ? D. Autoimmune thyroid disease: The patient had a goiter and a TPO antibody level of 287.2 on her first visit in May of 2008, consistent with evolving Hashimoto's Disease. She occasionally had the sensations of swelling and discomfort in her anterior neck, which were also c/w flare-ups of Hashimoto's Disease. In early 2009 she developed increased tachycardia. Lab tests performed in March 2010 by her primary care provider showed a TSH of 0.01 and a T4 of 7.7. It was initially felt that the increase in tachycardia was likely due to a combination of autonomic neuropathy and Hashitoxicosis.  However on follow-up laboratory tests performed in July, her TSH was 0.05, free T4 3.9, and free T3 14.2. Her TSI level done on the assay used at the time was 2.2, with normal being 1.0 or less. It was then evident that she had developed Graves' Disease in the setting of pre-existing Hashimoto's Disease.  I initiated treatment with methimazole, 10 mg twice daily and propranolol 20 mg 3 times daily. Since then we've seen significant swings of her thyroid function tests, partly due to changes in methimazole doses, but also partly due to changes in her immunosuppressant therapies. Her TSH values have ranged from a low of 0.01 to a high of 14.933. The dose of methimazole has been adjusted as needed to treat her Graves' disease. It has been my hope that her Hashimoto's disease will eventually cancel out her Graves'  disease.  ? E. 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 She has since recovered fairly well, but her sense of smell is still absent and her new OCD tendencies persist.  ? F. Her husband was positive for covid-19 on 05/24/19. She was positive as well. ?G. In April 2022 she was pregnant and her OB discontinued her MTZ and switched her from lisinopril to labetolol 100 mg, twice daily. In May 2022 she had a miscarriage.  ? ?3.The patient's last [facility] visit was on 04/09/21. At that visit I increased her Lantus dose to 33 units as tolerated and continued her current Novolog plan, but add two units at each meal. After reviewing her lab results from 06/01/21, I increased her MTZ to 10 mg, three times daily. However, on 07/31/21 I learned that she was pregnant again with an [place] of 03/28/22. I discontinued the MTZ and started her on PTU, 200 mg, 3 times daily.  ?A. In the interim she has been generally healthy. ?Cephus Richer had pre-pump education with Dr. Lovena Le on 06/01/21. She will probably start her pump on 08/31/21. ? C. She has been taking the Lantus dose of 33 units and following her Novolog plan.  She obtained a Dexcom in September 2022. ? D. Her BP has been stable. She continues taking labetolol.  ?E. She has not been having any hyperthyroid symptoms or hypothyroid symptoms, except for being more tired. Her energy level is "lower". She is not jittery or shaky. She is sleeping well, but still occasionally has insomnia.  ? F. She continues to have frequent and annoying OCD tendencies since her brain injury, for example, checking the door locks 11 times per day, making sure the stove is off multiple times, and making sure all the electrical appliances are plugged in correctly.  ? G. Her CIDP is about the same. She saw Dr. Edison Nasuti at the  [facility] in June 2018. No changes in her CDIP treatment plan were made. She remains on her regimen of IVIG and iv steroids. She continues to have IVIG every  Monday and Thursday. She receives iv steroids on Monday. Her steroid dose has not been changed since her last visit. better. She uses a wheel chair at work, but uses her walker to walk 1 mile per day. She

## 2021-08-28 ENCOUNTER — Ambulatory Visit (INDEPENDENT_AMBULATORY_CARE_PROVIDER_SITE_OTHER): Payer: BC Managed Care – PPO | Admitting: "Endocrinology

## 2021-08-28 ENCOUNTER — Ambulatory Visit: Payer: BC Managed Care – PPO | Attending: Obstetrics and Gynecology

## 2021-08-28 ENCOUNTER — Encounter (INDEPENDENT_AMBULATORY_CARE_PROVIDER_SITE_OTHER): Payer: Self-pay | Admitting: "Endocrinology

## 2021-08-28 ENCOUNTER — Other Ambulatory Visit: Payer: Self-pay | Admitting: *Deleted

## 2021-08-28 ENCOUNTER — Encounter: Payer: Self-pay | Admitting: *Deleted

## 2021-08-28 ENCOUNTER — Ambulatory Visit: Payer: BC Managed Care – PPO | Admitting: *Deleted

## 2021-08-28 VITALS — BP 145/89 | HR 104

## 2021-08-28 VITALS — BP 130/84 | HR 104 | Ht 65.0 in | Wt 148.0 lb

## 2021-08-28 DIAGNOSIS — Z363 Encounter for antenatal screening for malformations: Secondary | ICD-10-CM

## 2021-08-28 DIAGNOSIS — I10 Essential (primary) hypertension: Secondary | ICD-10-CM

## 2021-08-28 DIAGNOSIS — E10649 Type 1 diabetes mellitus with hypoglycemia without coma: Secondary | ICD-10-CM

## 2021-08-28 DIAGNOSIS — O99891 Other specified diseases and conditions complicating pregnancy: Secondary | ICD-10-CM

## 2021-08-28 DIAGNOSIS — R1013 Epigastric pain: Secondary | ICD-10-CM

## 2021-08-28 DIAGNOSIS — G6181 Chronic inflammatory demyelinating polyneuritis: Secondary | ICD-10-CM

## 2021-08-28 DIAGNOSIS — O10011 Pre-existing essential hypertension complicating pregnancy, first trimester: Secondary | ICD-10-CM

## 2021-08-28 DIAGNOSIS — O99281 Endocrine, nutritional and metabolic diseases complicating pregnancy, first trimester: Secondary | ICD-10-CM

## 2021-08-28 DIAGNOSIS — E063 Autoimmune thyroiditis: Secondary | ICD-10-CM | POA: Diagnosis not present

## 2021-08-28 DIAGNOSIS — E1065 Type 1 diabetes mellitus with hyperglycemia: Secondary | ICD-10-CM

## 2021-08-28 DIAGNOSIS — E1043 Type 1 diabetes mellitus with diabetic autonomic (poly)neuropathy: Secondary | ICD-10-CM

## 2021-08-28 DIAGNOSIS — O24111 Pre-existing diabetes mellitus, type 2, in pregnancy, first trimester: Secondary | ICD-10-CM | POA: Diagnosis present

## 2021-08-28 DIAGNOSIS — R5383 Other fatigue: Secondary | ICD-10-CM

## 2021-08-28 DIAGNOSIS — Z3A01 Less than 8 weeks gestation of pregnancy: Secondary | ICD-10-CM

## 2021-08-28 DIAGNOSIS — E0500 Thyrotoxicosis with diffuse goiter without thyrotoxic crisis or storm: Secondary | ICD-10-CM

## 2021-08-28 DIAGNOSIS — O24011 Pre-existing diabetes mellitus, type 1, in pregnancy, first trimester: Secondary | ICD-10-CM

## 2021-08-28 DIAGNOSIS — K900 Celiac disease: Secondary | ICD-10-CM

## 2021-08-28 NOTE — Patient Instructions (Signed)
Follow up visit at 11:15 AM on Wednesday, 09/05/21. ? ?At [facility], we are committed to providing exceptional care. You will receive a patient satisfaction survey through text or email regarding your visit today. Your opinion is important to me. Comments are appreciated. ? ?

## 2021-08-29 ENCOUNTER — Telehealth (INDEPENDENT_AMBULATORY_CARE_PROVIDER_SITE_OTHER): Payer: Self-pay

## 2021-08-29 ENCOUNTER — Other Ambulatory Visit (INDEPENDENT_AMBULATORY_CARE_PROVIDER_SITE_OTHER): Payer: Self-pay | Admitting: "Endocrinology

## 2021-08-29 DIAGNOSIS — E1065 Type 1 diabetes mellitus with hyperglycemia: Secondary | ICD-10-CM

## 2021-08-29 NOTE — Telephone Encounter (Signed)
Spoke to pt about lab results, she stated understanding and had no further questions. ?

## 2021-08-29 NOTE — Telephone Encounter (Signed)
Left message for pt to call me back to discuss results ?

## 2021-08-29 NOTE — Telephone Encounter (Signed)
-----   Message from Sherrlyn Hock, MD sent at 08/29/2021  1:00 PM EDT ----- ?HbA1c was 8.0%, down from 8.6% in November. ?Urinary protein was mid-normal. ?TSH was 3.10, up from 0.80  three weeks ago. ?Free T4 was 0.8, up from 0.6 three weeks ago. ?Free T3 was 2.4, down from 2.8 three weeks ago.  ?TSI is pending. ?Overall the free T4 and free T3 are normal, but low-normal. They will increase after discontinuing the PTU. Please stay off the PTU until I see you again next week. We will repeat your thyroid tests then.  ?  ?

## 2021-08-30 LAB — COMPREHENSIVE METABOLIC PANEL
AG Ratio: 0.7 (calc) — ABNORMAL LOW (ref 1.0–2.5)
ALT: 23 U/L (ref 6–29)
AST: 34 U/L — ABNORMAL HIGH (ref 10–30)
Albumin: 3.7 g/dL (ref 3.6–5.1)
Alkaline phosphatase (APISO): 90 U/L (ref 31–125)
BUN: 18 mg/dL (ref 7–25)
CO2: 20 mmol/L (ref 20–32)
Calcium: 9.7 mg/dL (ref 8.6–10.2)
Chloride: 99 mmol/L (ref 98–110)
Creat: 0.65 mg/dL (ref 0.50–0.96)
Globulin: 5.6 g/dL (calc) — ABNORMAL HIGH (ref 1.9–3.7)
Glucose, Bld: 135 mg/dL (ref 65–139)
Potassium: 3.7 mmol/L (ref 3.5–5.3)
Sodium: 134 mmol/L — ABNORMAL LOW (ref 135–146)
Total Bilirubin: 0.4 mg/dL (ref 0.2–1.2)
Total Protein: 9.3 g/dL — ABNORMAL HIGH (ref 6.1–8.1)

## 2021-08-30 LAB — HEMOGLOBIN A1C
Hgb A1c MFr Bld: 8 % of total Hgb — ABNORMAL HIGH (ref <5.7)
Mean Plasma Glucose: 183 mg/dL
eAG (mmol/L): 10.1 mmol/L

## 2021-08-30 LAB — T3, FREE: T3, Free: 2.4 pg/mL (ref 2.3–4.2)

## 2021-08-30 LAB — MICROALBUMIN / CREATININE URINE RATIO
Creatinine, Urine: 147 mg/dL (ref 20–275)
Microalb Creat Ratio: 12 mcg/mg creat (ref <30)
Microalb, Ur: 1.8 mg/dL

## 2021-08-30 LAB — THYROID STIMULATING IMMUNOGLOBULIN: TSI: <89 % baseline (ref <140)

## 2021-08-30 LAB — TSH: TSH: 3.1 mIU/L

## 2021-08-30 LAB — T4, FREE: Free T4: 0.8 ng/dL (ref 0.8–1.8)

## 2021-08-31 ENCOUNTER — Other Ambulatory Visit (INDEPENDENT_AMBULATORY_CARE_PROVIDER_SITE_OTHER): Payer: BC Managed Care – PPO | Admitting: Pharmacist

## 2021-09-04 ENCOUNTER — Ambulatory Visit (INDEPENDENT_AMBULATORY_CARE_PROVIDER_SITE_OTHER): Payer: BC Managed Care – PPO | Admitting: Pharmacist

## 2021-09-04 ENCOUNTER — Encounter (INDEPENDENT_AMBULATORY_CARE_PROVIDER_SITE_OTHER): Payer: Self-pay | Admitting: Pharmacist

## 2021-09-04 VITALS — BP 118/68 | HR 96 | Wt 149.0 lb

## 2021-09-04 DIAGNOSIS — E10649 Type 1 diabetes mellitus with hypoglycemia without coma: Secondary | ICD-10-CM

## 2021-09-04 LAB — POCT GLUCOSE (DEVICE FOR HOME USE): POC Glucose: 235 mg/dl — AB (ref 70–99)

## 2021-09-04 NOTE — Patient Instructions (Signed)
It was a pleasure seeing you today! ? ?Please refer to this video when you are changing your pump site. ?You will fill your cartridge up with 300 units of insulin (will be 3 mL on your syringe) ? ?CHANGE MY INSULIN PUMP SITE WITH ME! // t:slim X2 - YouTube ? ?How to change Dexcom sensor (change on PUMP first - it will connect to cell phone after) ? ?Dexcom G6 CGM, a Step-by-Step Tutorial + tips. Tandem t:slim X2 Insulin Pump & iPhone Connected - YouTube ? ?How To Change The Dexcom G6 With The T:Slim X2 Insulin Pump! ??  TypeOneLiv - YouTube ? ?How to change Dexcom transmitter (change on PUMP first - it will connect to cell phone after) ? ?Starting a Production assistant, radio without removing an Old Sensor on the t:slim [place] ? ?PAIRING A NEW DEXCOM TRANSMITTER  The Diabetic [place]actus - YouTube ? ?NON-STEROID DAYS ?If your pump breaks, your long acting insulin dose would be Basaglar 36 units daily. You would do the following equation for your Humalog: ? ?Humalog total dose = food dose + correction dose ?Food dose: total carbohydrates divided by insulin carbohydrate ratio (ICR) ?Your ICR is 4.5 for breakfast, 5.5 for lunch, and 6 for dinner ?Correction dose: (current blood sugar - target blood sugar) divided by insulin sensitivity factor (ISF) ?Your ISF is 30 during the day and 35 at night. ?Your target blood sugar is 100-120 during the day and 150-180 at night. ? ?STEROID DAYS ?If your pump breaks, your long acting insulin dose would be Basaglar 39 units daily. You would do the following equation for your Humalog: ? ?Humalog total dose = food dose + correction dose ?Food dose: total carbohydrates divided by insulin carbohydrate ratio (ICR) ?Your ICR is 2 for breakfast, 2 for lunch, and 2 for dinner ?Correction dose: (current blood sugar - target blood sugar) divided by insulin sensitivity factor (ISF) ?Your ISF is 20 during the day and 25 at night. ?Your target blood sugar is 100-120 during the day and 150-180 at  night. ? ?PLEASE REMEMBER TO CONTACT OFFICE IF YOU ARE AT RISK OF RUNNING OUT OF PUMP SUPPLIES, INSULIN PEN SUPPLIES, OR IF YOU WANT TO KNOW WHAT YOUR BACK UP INSULIN PEN DOSES ARE.  ? ?Today the plan is... ?Continue to use tandem t:slim X2 insulin pump and change site every 2-3 days ?Make sure to set up the t:connect phone app if you have not done so already ?Go to tandemdiabetes.com --> support --> product support as a helpful reference for questions regarding your insulin pump ?If referring to the tandem website does not answer your question please feel free to reach out to me, Dr. Lovena Le, through [place] or via phone at [phone] ? ?Important Contact Information  ?TECHNICAL SUPPORT ?[phone] ?24 hours/day ?7 days a week ? ?PUMP RENEWALS ?[phone] ?6:00 AM to 5:00 PM  ([place]) ?Monday - Friday ? ?ORDER SUPPORT ?[phone] ?6:00 AM to 5:00 PM ([place]) ?Monday - Friday  ?

## 2021-09-04 NOTE — Progress Notes (Addendum)
S:     Chief Complaint  Patient presents with   Diabetes    Education    Endocrinology provider: Dr. Tobe Sos (no upcoming appt)  Patient referred to me by Dr. Tobe Sos for tandem t:slim X2 insulin pump training. PMH significant for T1DM, migraine without aura, inappropraite sinus tachycardia, celiac disease, hypothyroidism, goiter, chronic inflammatory demyelinating polyneuropathy, scoliosis, anemia, macular degeneration, OCD. Patient is currently using Dexcom G6 CGM. Patient reports reducing Basaglar dose last night.  Patient presents today with her pump supplies and vial of Humalog. Patient recently informed me she is pregnant. She receives solumedrol 500 mg once weekly on MONDAYS and notices a drastic impact on BG readings. She follows ICR 1:5 on non-steroid days and 1:3 on steroid days. She states she will take 4-5 units if BG is in mid 200s (does not follow chart or math to determine dosing). She will take Basaglar 32 units daily on non steroid days and 36 units on steroid days. She eats BF typically ~8:45 am (7-8 units on non-steroid days, 16 units on steroid days), lunch ~1:00 pm (5-6 units on non-steroid days, 10-12 units on steroid days), dinner~6:30-7:00 pm (15 units on non-steroid days, 30 units on steroid days). She does not require bedtime correction dose on non-steroid days, but does require bedtime correction dose (~10 units) on steroid days.  Insurance [place], [place] - [employer] consultant) Covered: Retail, Mail OrderNot covered: Unknown: Specialty, Long-Term Care              Member ID: [id number] BIN: [id number]  DOB: August 05, 1990  Group ID: [id number] PCN:   Legal sex: F  Group name: [employer]  Address: [place]   Member nameMarland Kitchen Jessica Kelley, Jessica Kelley [facility] [id number]    Pump Serial Number: [id number]  Infusion Set: [employer] XC 6 mm   Tandem T:Slim X2 Insulin Pump Education Training Please refer to Insulin Pump Training Checklist scanned into  media   Labs:  BG Before Training: 235 mg/dL  Dexcom Clarity     Vitals:   09/04/21 0826  BP: 118/68  Pulse: 96    HbA1c Lab Results  Component Value Date   HGBA1C 8.0 (H) 08/28/2021   HGBA1C 8.6 (A) 04/09/2021   HGBA1C 8.9 (A) 07/31/2020    Pancreatic Islet Cell Autoantibodies No results found for: ISLETAB  Insulin Autoantibodies No results found for: INSULINAB  Glutamic Acid Decarboxylase Autoantibodies No results found for: GLUTAMICACAB  ZnT8 Autoantibodies No results found for: ZNT8AB  IA-2 Autoantibodies No results found for: LABIA2  C-Peptide No results found for: CPEPTIDE  Microalbumin Lab Results  Component Value Date   MICRALBCREAT 12 08/28/2021    Lipids    Component Value Date/Time   CHOL 187 04/09/2021 1023   TRIG 73 04/09/2021 1023   HDL 71 04/09/2021 1023   CHOLHDL 2.6 04/09/2021 1023   VLDL 25 04/25/2008 0923   LDLCALC 100 (H) 04/09/2021 1023   LDLDIRECT 136.9 04/25/2008 0923    Assessment: Pump Settings - Current TIR and A1c not at goal for a patient with diabetes who is pregnant. Hypoglycemia occurs sporadically after lunch and dinner; Jessica Kelley reports this occurred after becoming pregnant and has not been able to identify any specific underlying cause. Patient takes different amounts of insulin on Monday (when she receives solumedrol) vs Tues-Sun (days she does NOT receive a steroid). On Monday she requires ~100 units for her TDD while Tues-Sun she requires ~60 units for her TDD. Solumedrol peaks around 1 hour and has  an elimination half life of 2-3 hours (excreted typically within 12 hours). Will make a different pump profile setting for steroid days. It is evident based on Dexcom Clarity readings that Solumedrol injection increases Jessica Kelley's blood glucose primarily during the day on Monday and Monday night going into Tuesday morning. Will create different profiles for steroid and non-steroid days.  NON-STEROID DAYS  (TUES-SUN) Patient is currently receiving TDD ~60 units; 53% basal and 47% bolus. Considering TIR is 50%, limited and sporadic hypoglycemia (not occurring in a pattern), and patient is pregnant will make aggressive increases to doses. Will opt for new TDD ~70 units. Will keep ~50% basal and ~50% bolus ratio therefore will use basal dose of ~35 units to determine basal settings. Reduced basal between 12AM - 3 AM as patient tends to randomly have hypoglycemia at this time with or without correction dose. However, even on days she does not have hypoglycemia she will experiencing hyperglycemia 3AM-6AM; will increase basal rates at this time.Will also make basal settings stronger throughout the day (not as strong as between 3AM-6AM as she experiences most significant hyperglycemia throughout this time). Based on rule of 450, ideal ICR may be 6.4. She is using ICR of 5; she is experiencing post prandial hyperglycemia after breakfast and post prandial hypoglycemia randomly at lunch and dinner (more often dinner than lunch). Will change ICR to 4.5 with breakfast, 5.5 with lunch, and 6 with dinner. Based on rule of 1800, ideal ISF may be 25. Based on her current math, she is typically following ISF 1:30 (considering 250 - 120 divided by 30 = ~4 units). Will continue ICR of 1:30 for now. If she administers a correction dose overnight she occasionally had hypoglycemia so will increase ISF to 35 at night. Change target BG to 110 mg/dL considering upgrade to hybrid closed loop system.   STEROID DAYS (MON) Patient is currently receiving TDD ~100 units; 36% basal and 64% bolus. Considering TIR is 50%, limited and sporadic hypoglycemia (not occurring in a pattern), and patient is pregnant will make aggressive increases to doses. Will opt for new TDD ~115 units. Will attempt to increase basal rates, therefore will use basal dose of ~38 units to determine basal settings. Reduced basal between 12AM - 3 AM as patient tends to  randomly have hypoglycemia at this time with or without correction dose. However, even on days she does not have hypoglycemia she will experiencing hyperglycemia 3AM-6AM; will increase basal rates at this time.Will also make basal settings stronger throughout the day (not as strong as between 3AM-6AM as she experiences most significant hyperglycemia throughout this time). Based on rule of 450, ideal ICR may be 4. She is using ICR of 3; she is experiencing post prandial hyperglycemia after ALL meals on Mondays. Will change ICR to 2 with all meals. Based on rule of 1800, ideal ISF may be 15. Based on her current math, she is typically following ISF 1:30 (considering 250 - 120 divided by 30 = ~4 units). Will decrease to 1:20 on steroid days. If she administers a correction dose overnight she occasionally had hypoglycemia so will increase ISF to 25 at night. Change target BG to 110 mg/dL considering upgrade to hybrid closed loop system. Turned OFF sleep activity on Monday nights so she would receive automatic corrections doses overnight to assist with hyperglycemia post steroid injection.   Pump Education - Tandem t:slim X2 Insulin pump applied successfully tp left side of lower back. Insulin pump was synced with Dexcom G6 CGM to  use Control IQ technology. Parents appeared to have sufficient understanding of subjects discussed during Tandem t:slim X2 insulin pump training appt.   Plan: Pump Settings  NON-STEROID DAYS (TUES-SUN; pump profile titled "Normal") Time Basal  (Max: 3 units/hr) Correction Factor Carb Ratio (Max: 25 units) Target BG  12AM 1.3 35 5 110  3AM 1.6 35 5 110  8AM 1.5 30 4.5 110  12PM 1.5 30 5.5 110  6PM 1.5 30 6  110   Total: 35.9 units       STEROID DAYS (MON - PT TAKES SOLUMEDROL 500 MG ONCE WEEKLY; pump profile titled "Steroid") Time Basal  (Max: 3 units/hr) Correction Factor Carb Ratio (Max: 25 units) Target BG  12AM 1.5 25 3  110  3AM 1.7 25 3  110  8AM 1.6 20 2  110  12PM  1.6 20 2  110  6PM 1.6 20 2  110   Total: 38.6 units      SLEEP ACTIVITY Monday - OFF Tues, Wed, Thurs: 11PM - 6AM Fri, Sat: 11PM - 8AM Sun: 11PM - 6AM  Tandem T:Slim X2 Insulin Pump  Continue to wear Tandem T:Slim insulin pump and change infusion set site every 3 days (cartridge filled 300 units) Thoroughly discussed how to assess bad infusion site change and appropriate management (notice BG is elevated, attempt to bolus via pump, recheck BG in 30 minutes, if BG has not decreased then disconnect pump and administer bolus via insulin pen, apply new infusion set, and repeat process).  Discussed back up plan if pump breaks (how to calculate insulin doses using insulin pens). Provided written copy of patient's current pump settings and handout explaining math on how to calculate settings. Discussed examples with family. Patient was able to use teach back method to demonstrate understanding of calculating dose for basal/bolus insulin pens from insulin pump settings.  Patient has Basaglar and Humalog insulin pen refills to use as back up until 2024. Reminded family they will need a new prescription annually.  Reimbursement Faxed invoice and training checklist to Tandem Follow Up:  Within 1 month  Written patient instructions provided and emailed to [email or web address].    It was a pleasure seeing you today!  Please refer to this video when you are changing your pump site. You will fill your cartridge up with 300 units of insulin (will be 3 mL on your syringe)  CHANGE MY INSULIN PUMP SITE WITH ME! // t:slim X2 - YouTube  How to change Dexcom sensor (change on PUMP first - it will connect to cell phone after)  Dexcom G6 CGM, a Step-by-Step Tutorial + tips. Tandem t:slim X2 Insulin Pump & iPhone Connected - YouTube  How To Change The Dexcom G6 With The T:Slim X2 Insulin Pump! ??  [employer] - YouTube  How to change Dexcom transmitter (change on PUMP first - it will connect to cell  phone after)  Starting a Production assistant, radio without removing an Old Sensor on the t:slim [place]  The Diabetic Cactus - YouTube  NON-STEROID DAYS If your pump breaks, your long acting insulin dose would be Basaglar 36 units daily. You would do the following equation for your Humalog:  Humalog total dose = food dose + correction dose Food dose: total carbohydrates divided by insulin carbohydrate ratio (ICR) Your ICR is 4.5 for breakfast, 5.5 for lunch, and 6 for dinner Correction dose: (current blood sugar - target blood sugar) divided by insulin sensitivity factor (ISF) Your ISF is 30 during the day  and 35 at night. Your target blood sugar is 100-120 during the day and 150-180 at night.  STEROID DAYS If your pump breaks, your long acting insulin dose would be Basaglar 39 units daily. You would do the following equation for your Humalog:  Humalog total dose = food dose + correction dose Food dose: total carbohydrates divided by insulin carbohydrate ratio (ICR) Your ICR is 2 for breakfast, 2 for lunch, and 2 for dinner Correction dose: (current blood sugar - target blood sugar) divided by insulin sensitivity factor (ISF) Your ISF is 20 during the day and 25 at night. Your target blood sugar is 100-120 during the day and 150-180 at night.  PLEASE REMEMBER TO CONTACT OFFICE IF YOU ARE AT RISK OF RUNNING OUT OF PUMP SUPPLIES, INSULIN PEN SUPPLIES, OR IF YOU WANT TO KNOW WHAT YOUR BACK UP INSULIN PEN DOSES ARE.   Today the plan is... Continue to use tandem t:slim X2 insulin pump and change site every 2-3 days Make sure to set up the t:connect phone app if you have not done so already Go to [email or web address] --> support --> product support as a helpful reference for questions regarding your insulin pump If referring to the tandem website does not answer your question please feel free to reach out to me, Dr. Lovena Le, through [place] or via phone at  [place] [phone] 24 hours/day 7 days a week  PUMP RENEWALS [phone] 6:00 AM to 5:00 PM  ([place]) Monday - Friday  ORDER SUPPORT [phone] 6:00 AM to 5:00 PM ([place]) Monday - Friday   This appointment required 120 minutes of patient care (this includes precharting, chart review, review of results, face-to-face care, etc.).  Thank you for involving clinical pharmacist/diabetes educator to assist in providing this patient's care.  Drexel Iha, PharmD, BCACP, CDCES, CPP  I have reviewed the following documentation and am in agreeance with the plan. I was immediately available to the clinical pharmacist for questions and collaboration.  Tillman Sers, MD

## 2021-09-14 ENCOUNTER — Telehealth (INDEPENDENT_AMBULATORY_CARE_PROVIDER_SITE_OTHER): Payer: Self-pay

## 2021-09-14 DIAGNOSIS — E063 Autoimmune thyroiditis: Secondary | ICD-10-CM

## 2021-09-14 NOTE — Telephone Encounter (Signed)
Tried to call pt, had to leave voicemail for pt to call back. ? ?[employer] message with lab results to pt. ?

## 2021-09-14 NOTE — Telephone Encounter (Signed)
-----   Message from Sherrlyn Hock, MD sent at 09/14/2021 11:54 AM EDT ----- ?CBC was again normal, except fr the platelet volume being slightly elevated, which is not really a problem. ?CMP was normal, except for the glucose being a bit low, probably due to delay in processing. Albumin and total protein were a bit low, probably due to pregnancy.One liver test is also a bit elevated, but lower. ?Thyroid tests are better and now normal. Please continue to hold the PTU.  ? ?Clinical staff: Please order repeat TSH, free T4, and free T3 to be done in 4 weeks. Thanks. ?Dr. Tobe Sos ?

## 2021-09-17 ENCOUNTER — Inpatient Hospital Stay (HOSPITAL_COMMUNITY)
Admission: AD | Admit: 2021-09-17 | Discharge: 2021-09-17 | Disposition: A | Discharge status: Home / Self Care | Payer: BC Managed Care – PPO | Attending: Obstetrics and Gynecology | Admitting: Obstetrics and Gynecology

## 2021-09-17 ENCOUNTER — Encounter (HOSPITAL_COMMUNITY): Payer: Self-pay | Admitting: Obstetrics and Gynecology

## 2021-09-17 DIAGNOSIS — G6181 Chronic inflammatory demyelinating polyneuritis: Secondary | ICD-10-CM | POA: Insufficient documentation

## 2021-09-17 DIAGNOSIS — O99351 Diseases of the nervous system complicating pregnancy, first trimester: Secondary | ICD-10-CM | POA: Insufficient documentation

## 2021-09-17 DIAGNOSIS — Z794 Long term (current) use of insulin: Secondary | ICD-10-CM | POA: Diagnosis not present

## 2021-09-17 DIAGNOSIS — K900 Celiac disease: Secondary | ICD-10-CM | POA: Insufficient documentation

## 2021-09-17 DIAGNOSIS — Z9641 Presence of insulin pump (external) (internal): Secondary | ICD-10-CM | POA: Diagnosis not present

## 2021-09-17 DIAGNOSIS — O200 Threatened abortion: Secondary | ICD-10-CM | POA: Insufficient documentation

## 2021-09-17 DIAGNOSIS — O99611 Diseases of the digestive system complicating pregnancy, first trimester: Secondary | ICD-10-CM | POA: Diagnosis not present

## 2021-09-17 DIAGNOSIS — O208 Other hemorrhage in early pregnancy: Secondary | ICD-10-CM | POA: Insufficient documentation

## 2021-09-17 DIAGNOSIS — K8681 Exocrine pancreatic insufficiency: Secondary | ICD-10-CM | POA: Diagnosis not present

## 2021-09-17 DIAGNOSIS — O24011 Pre-existing diabetes mellitus, type 1, in pregnancy, first trimester: Secondary | ICD-10-CM | POA: Diagnosis not present

## 2021-09-17 DIAGNOSIS — Z3A10 10 weeks gestation of pregnancy: Secondary | ICD-10-CM | POA: Insufficient documentation

## 2021-09-17 DIAGNOSIS — O418X10 Other specified disorders of amniotic fluid and membranes, first trimester, not applicable or unspecified: Secondary | ICD-10-CM

## 2021-09-17 DIAGNOSIS — O4691 Antepartum hemorrhage, unspecified, first trimester: Secondary | ICD-10-CM | POA: Diagnosis present

## 2021-09-17 LAB — COMPREHENSIVE METABOLIC PANEL
AG Ratio: 0.7 (calc) — ABNORMAL LOW (ref 1.0–2.5)
ALT: 27 U/L (ref 6–29)
AST: 33 U/L — ABNORMAL HIGH (ref 10–30)
Albumin: 3.4 g/dL — ABNORMAL LOW (ref 3.6–5.1)
Alkaline phosphatase (APISO): 79 U/L (ref 31–125)
BUN: 9 mg/dL (ref 7–25)
CO2: 24 mmol/L (ref 20–32)
Calcium: 9 mg/dL (ref 8.6–10.2)
Chloride: 102 mmol/L (ref 98–110)
Creat: 0.5 mg/dL (ref 0.50–0.96)
Globulin: 5.2 g/dL (calc) — ABNORMAL HIGH (ref 1.9–3.7)
Glucose, Bld: 56 mg/dL — ABNORMAL LOW (ref 65–139)
Potassium: 3.5 mmol/L (ref 3.5–5.3)
Sodium: 134 mmol/L — ABNORMAL LOW (ref 135–146)
Total Bilirubin: 0.3 mg/dL (ref 0.2–1.2)
Total Protein: 8.6 g/dL — ABNORMAL HIGH (ref 6.1–8.1)

## 2021-09-17 LAB — ABO/RH: ABO/RH(D): O POS

## 2021-09-17 LAB — CBC
HCT: 37.2 % (ref 36.0–46.0)
Hemoglobin: 12.2 g/dL (ref 12.0–15.0)
MCH: 29 pg (ref 26.0–34.0)
MCHC: 32.8 g/dL (ref 30.0–36.0)
MCV: 88.6 fL (ref 80.0–100.0)
Platelets: 187 10*3/uL (ref 150–400)
RBC: 4.2 MIL/uL (ref 3.87–5.11)
RDW: 15.3 % (ref 11.5–15.5)
WBC: 9 10*3/uL (ref 4.0–10.5)
nRBC: 0 % (ref 0.0–0.2)

## 2021-09-17 LAB — CBC WITH DIFFERENTIAL/PLATELET
Absolute Monocytes: 853 cells/uL (ref 200–950)
Basophils Absolute: 49 cells/uL (ref 0–200)
Basophils Relative: 0.6 %
Eosinophils Absolute: 33 cells/uL (ref 15–500)
Eosinophils Relative: 0.4 %
HCT: 38.6 % (ref 35.0–45.0)
Hemoglobin: 12.4 g/dL (ref 11.7–15.5)
Lymphs Abs: 1476 cells/uL (ref 850–3900)
MCH: 28.7 pg (ref 27.0–33.0)
MCHC: 32.1 g/dL (ref 32.0–36.0)
MCV: 89.4 fL (ref 80.0–100.0)
MPV: 12.6 fL — ABNORMAL HIGH (ref 7.5–12.5)
Monocytes Relative: 10.4 %
Neutro Abs: 5789 cells/uL (ref 1500–7800)
Neutrophils Relative %: 70.6 %
Platelets: 178 10*3/uL (ref 140–400)
RBC: 4.32 10*6/uL (ref 3.80–5.10)
RDW: 14.3 % (ref 11.0–15.0)
Total Lymphocyte: 18 %
WBC: 8.2 10*3/uL (ref 3.8–10.8)

## 2021-09-17 LAB — T3, FREE: T3, Free: 4 pg/mL (ref 2.3–4.2)

## 2021-09-17 LAB — T4, FREE: Free T4: 0.9 ng/dL (ref 0.8–1.8)

## 2021-09-17 LAB — THYROID STIMULATING IMMUNOGLOBULIN: TSI: <89 % baseline (ref <140)

## 2021-09-17 LAB — TSH: TSH: 2.37 mIU/L

## 2021-09-17 NOTE — MAU Provider Note (Signed)
?History  ?  ? ?CSN: [id number] ? ?Arrival date and time: 09/17/21 1502 ? ? Event Date/Time  ? First Provider Initiated Contact with Patient 09/17/21 1558   ?  ? ?Chief Complaint  ?Patient presents with  ? Vaginal Bleeding  ? ?HPI ?This is a 31yo G2P0010 at 37w5dwho presents with vaginal bleeding that started this morning. She was sitting on the couch and felt something wet in her underwear. She got up and noticed a pool of blood. She passed a large clot (not sure of the size) in the toilet. Since that time, she hasn't had any further bleeding.  ? 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 ? ?OB History   ? ? Gravida  ?2  ? Para  ?   ? Term  ?   ? Preterm  ?   ? AB  ?1  ? Living  ?   ?  ? ? SAB  ?1  ? IAB  ?   ? Ectopic  ?   ? Multiple  ?   ? Live Births  ?   ?   ?  ?  ? ? ?Past Medical History:  ?Diagnosis Date  ? Brain bleed ([place]   ? Patient stated  ? Celiac disease   ? CIDP (chronic inflammatory demyelinating polyneuropathy) [place]  ? Diabetes type I ([facility]   ? Dr. BTobe Sos ? Glaucoma   ? Hyperthyroidism   ? Macular degeneration   ? Pilonidal cyst   ? Scoliosis   ? Wyburn-Mason syndrome   ? ? ?Past Surgical History:  ?Procedure Laterality Date  ? CATARACT EXTRACTION  06/25/06  ? FOOT ARTHROPLASTY    ? left foot moved and stretched tendons   ? GLAUCOMA SURGERY  02/12/06  ? HIP ARTHROPLASTY    ? right never bipopsy  ? PORTACATH PLACEMENT  2002-2004  ? removal of vascular port    ? Sciatic Nerve Muscle Biopsy    ? ? ?Family History  ?Problem Relation Age of Onset  ? Prostate cancer Maternal Grandfather   ? Cancer Maternal Grandfather   ?     prostate  ? Coronary artery disease Paternal Grandfather   ? Diabetes Sister   ? Other Sister   ?     thyroid issues  ? Thyroid disease Mother   ? Cancer Paternal Grandmother   ?     Died at 645- kidney  ? Hyperlipidemia Other   ? ? ?Social History  ? ?Tobacco Use  ? Smoking status: Never  ? Smokeless tobacco: Never  ? Tobacco comments:  ?  passive smoke exposure  ?Vaping Use   ? Vaping Use: Never used  ?Substance Use Topics  ? Alcohol use: Yes  ?  Alcohol/week: 0.0 standard drinks  ?  Comment: OCC  ? Drug use: No  ? ? ?Allergies:  ?Allergies  ?Allergen Reactions  ? Gluten Meal   ? Wheat Bran   ? Ginger Rash  ? ? ?Medications Prior to Admission  ?Medication Sig Dispense Refill Last Dose  ? Continuous Blood Gluc Sensor (DEXCOM G6 SENSOR) MISC Change sensor every 10 days 3 each 3 09/17/2021  ? Continuous Blood Gluc Transmit (DEXCOM G6 TRANSMITTER) MISC Use with sensor, Change ever 90 days 1 each 3 09/17/2021  ? immune globulin, human, (GAMMAGARD S/D LESS IGA) 10 g injection Inject into the vein.   Past Month  ? insulin lispro (HUMALOG) 100 UNIT/ML injection INJECT UP TO 300 UNITS INTO INSULIN PUMP EVERY 2 DAYS.  50 mL 5 09/17/2021  ? labetalol (NORMODYNE) 200 MG tablet Take 200 mg by mouth 2 (two) times daily.   09/17/2021  ? Multiple Vitamins-Minerals (MULTIVITAMIN WITH MINERALS) tablet Take by mouth.   09/17/2021  ? progesterone (PROMETRIUM) 200 MG capsule Take 200 mg by mouth daily.   09/16/2021  ? Glucagon (BAQSIMI TWO PACK) 3 MG/DOSE POWD Place 1 spray into the nose as directed. (Patient not taking: Reported on 08/17/2021) 2 each 3   ? insulin degludec (TRESIBA FLEXTOUCH) 100 UNIT/ML FlexTouch Pen Inject up to 50 units daily per provider instructions. Please place on hold until patient requests. 15 mL 3   ? insulin glargine-yfgn (SEMGLEE, YFGN,) 100 UNIT/ML Pen INJECT 50 UNITS SUBCUTANEOUSLY ONCE DAILY 45 mL 0   ? insulin lispro (HUMALOG KWIKPEN) 100 UNIT/ML KwikPen Inject up to 50 units daily per provider guidance. Keep on file in case insulin pump breaks. 15 mL 3   ? NOVOLOG FLEXPEN 100 UNIT/ML FlexPen Inject up to 50 units daily 45 mL 4   ? NOVOLOG RELION 100 UNIT/ML injection INJECT UP TO 300 UNITS VIA INSULIN PUMP EVERY 2 DAYS 50 mL 3   ? propranolol (INDERAL) 10 MG tablet Take 1/2 to 1 tablet daily. 30 tablet 11   ? propylthiouracil (PTU) 50 MG tablet Take 4 tablets three times daily. 360  tablet 5   ? SEMGLEE, YFGN, 100 UNIT/ML Pen Inject into the skin.     ? ? ?Review of Systems ?Physical Exam  ? ?Blood pressure (!) 142/85, pulse (!) 115, temperature 98.3 ?F (36.8 ?C), temperature source Oral, resp. rate 15, last menstrual period 07/04/2021, SpO2 100 %, unknown if currently breastfeeding. ? ?Physical Exam ?Vitals and nursing note reviewed.  ?Constitutional:   ?   Appearance: Normal appearance.  ?Cardiovascular:  ?   Rate and Rhythm: Normal rate and regular rhythm.  ?Pulmonary:  ?   Effort: Pulmonary effort is normal.  ?Skin: ?   General: Skin is warm and dry.  ?   Capillary Refill: Capillary refill takes less than 2 seconds.  ?Neurological:  ?   General: No focal deficit present.  ?   Mental Status: She is alert.  ?Psychiatric:     ?   Mood and Affect: Mood normal.     ?   Behavior: Behavior normal.     ?   Thought Content: Thought content normal.  ? ?Results for orders placed or performed during the hospital encounter of 09/17/21 (from the past 24 hour(s))  ?ABO/Rh     Status: None  ? Collection Time: 09/17/21  4:14 PM  ?Result Value Ref Range  ? ABO/RH(D) O POS   ? No rh immune globuloin    ?  NOT A RH IMMUNE GLOBULIN CANDIDATE, PT RH POSITIVE ?Performed at [facility], [place] [address], [place], [place] [address] ?  ?CBC     Status: None  ? Collection Time: 09/17/21  4:14 PM  ?Result Value Ref Range  ? WBC 9.0 4.0 - 10.5 K/uL  ? RBC 4.20 3.87 - 5.11 MIL/uL  ? Hemoglobin 12.2 12.0 - 15.0 g/dL  ? HCT 37.2 36.0 - 46.0 %  ? MCV 88.6 80.0 - 100.0 fL  ? MCH 29.0 26.0 - 34.0 pg  ? MCHC 32.8 30.0 - 36.0 g/dL  ? RDW 15.3 11.5 - 15.5 %  ? Platelets 187 150 - 400 K/uL  ? nRBC 0.0 0.0 - 0.2 %  ? ? ?MAU Course  ?Procedures ? ?MDM ?Pt  informed that the ultrasound is considered a limited OB ultrasound and is not intended to be a complete ultrasound exam.  Patient also informed that the ultrasound is not being completed with the intent of assessing for fetal or placental anomalies or any pelvic  abnormalities.  Explained that the purpose of today?s ultrasound is to assess for  viability.  Patient acknowledges the purpose of the exam and the limitations of the study.   ? ?US shows IUP with CRL about [redacted]w[redacted]d FHR 176. Moderate subchorionic hematoma seen. ? ?Assessment and Plan  ? ?1. [redacted] weeks gestation of pregnancy   ?2. Subchorionic hemorrhage of placenta in first trimester, single or unspecified fetus   ?3. Threatened miscarriage   ? ?Discharge to home.  ?No indication for rhogam. ?Pelvic rest for 2 weeks. F/u with primary OB on Thursday. ?Return precautions given. ? ?JTruett Mainland?09/17/2021, 4:27 PM  ?

## 2021-09-17 NOTE — MAU Note (Signed)
.  Jessica Kelley is a 31 y.o. at 78w5dhere in MAU reporting: gush of bright red blood around 1230. She states she has passed a few large clots, then her doctor told her to come to MAU for evaluation. States she previously had a small subchorionic hematoma. Denies pain. ? ?Pain score: 0 ?Vitals:  ? 09/17/21 1518  ?BP: (!) 142/85  ?Pulse: (!) 115  ?Resp: 15  ?Temp: 98.3 ?F (36.8 ?C)  ?SpO2: 100%  ?   ?FHT:177 ? ?

## 2021-09-21 ENCOUNTER — Other Ambulatory Visit: Payer: Self-pay | Admitting: *Deleted

## 2021-09-21 ENCOUNTER — Ambulatory Visit: Payer: BC Managed Care – PPO

## 2021-09-21 ENCOUNTER — Ambulatory Visit: Payer: BC Managed Care – PPO | Attending: Obstetrics

## 2021-09-21 ENCOUNTER — Other Ambulatory Visit: Payer: Self-pay | Admitting: Obstetrics

## 2021-09-21 ENCOUNTER — Ambulatory Visit: Payer: BC Managed Care – PPO | Admitting: *Deleted

## 2021-09-21 VITALS — BP 103/61 | HR 102

## 2021-09-21 DIAGNOSIS — O24011 Pre-existing diabetes mellitus, type 1, in pregnancy, first trimester: Secondary | ICD-10-CM | POA: Diagnosis not present

## 2021-09-21 DIAGNOSIS — O99281 Endocrine, nutritional and metabolic diseases complicating pregnancy, first trimester: Secondary | ICD-10-CM | POA: Diagnosis not present

## 2021-09-21 DIAGNOSIS — O24311 Unspecified pre-existing diabetes mellitus in pregnancy, first trimester: Secondary | ICD-10-CM

## 2021-09-21 DIAGNOSIS — E040 Nontoxic diffuse goiter: Secondary | ICD-10-CM

## 2021-09-21 DIAGNOSIS — O10911 Unspecified pre-existing hypertension complicating pregnancy, first trimester: Secondary | ICD-10-CM | POA: Insufficient documentation

## 2021-09-21 DIAGNOSIS — O24911 Unspecified diabetes mellitus in pregnancy, first trimester: Secondary | ICD-10-CM

## 2021-09-21 DIAGNOSIS — G6181 Chronic inflammatory demyelinating polyneuritis: Secondary | ICD-10-CM

## 2021-09-21 DIAGNOSIS — E0500 Thyrotoxicosis with diffuse goiter without thyrotoxic crisis or storm: Secondary | ICD-10-CM

## 2021-09-21 DIAGNOSIS — O99351 Diseases of the nervous system complicating pregnancy, first trimester: Secondary | ICD-10-CM

## 2021-09-21 DIAGNOSIS — Z3A11 11 weeks gestation of pregnancy: Secondary | ICD-10-CM

## 2021-09-21 DIAGNOSIS — O10011 Pre-existing essential hypertension complicating pregnancy, first trimester: Secondary | ICD-10-CM

## 2021-09-29 ENCOUNTER — Other Ambulatory Visit: Payer: Self-pay

## 2021-10-01 ENCOUNTER — Telehealth: Payer: Self-pay | Admitting: Genetics

## 2021-10-01 NOTE — Telephone Encounter (Signed)
Called Jessica Kelley to return low risk NIPS results. Left voicemail with [facility] call back number. Jessica Kelley has viewed her results on the [employer] portal.  ?

## 2021-10-03 ENCOUNTER — Telehealth: Payer: Self-pay | Admitting: Genetics

## 2021-10-03 NOTE — Telephone Encounter (Signed)
Jessica Kelley returned genetic counseling's phone call. We reviewed that her Non-Invasive Prenatal Screening (NIPS) result is low risk. This screening significantly reduces the risk that the current pregnancy has Down syndrome, Trisomy 44, Trisomy 13, Monosomy X, and Triploidy. Jessica Kelley understands that this is a screening and not a diagnostic test. All questions answered. ? ?

## 2021-10-10 ENCOUNTER — Telehealth: Payer: Self-pay | Admitting: *Deleted

## 2021-10-10 NOTE — Telephone Encounter (Signed)
Returned pt call . Pt c/o "intense lower abd cramping " for the past 3 days with sm amt brown discharge.  States she tried to doppler her fetus and was unable to hear a heart rate. Pt very anxious. Recommended pt call Dr. Lynnette Caffey office to be seen in office today. If not able to be seen today, she may go to MAU for evaluation.

## 2021-10-11 ENCOUNTER — Telehealth (INDEPENDENT_AMBULATORY_CARE_PROVIDER_SITE_OTHER): Payer: Self-pay | Admitting: "Endocrinology

## 2021-10-11 DIAGNOSIS — E875 Hyperkalemia: Secondary | ICD-10-CM

## 2021-10-11 DIAGNOSIS — E871 Hypo-osmolality and hyponatremia: Secondary | ICD-10-CM

## 2021-10-11 DIAGNOSIS — D508 Other iron deficiency anemias: Secondary | ICD-10-CM

## 2021-10-11 DIAGNOSIS — E0500 Thyrotoxicosis with diffuse goiter without thyrotoxic crisis or storm: Secondary | ICD-10-CM

## 2021-10-11 NOTE — Telephone Encounter (Signed)
Jessica Kelley had questions about how frequently she should have her thyroid lab tests done during the pregnancy.  I called her, but she was not available. I left a VM message asking her to call me at home tonight between 8:00-10:30 PM. 3. I ordered TFTs, TSI, CMP, CBC, and iron.   Tillman Sers, MD, CDE

## 2021-10-16 ENCOUNTER — Ambulatory Visit: Payer: BC Managed Care – PPO

## 2021-10-16 NOTE — Telephone Encounter (Signed)
See other note from this date.

## 2021-10-21 LAB — COMPREHENSIVE METABOLIC PANEL
AG Ratio: 0.7 (calc) — ABNORMAL LOW (ref 1.0–2.5)
ALT: 24 U/L (ref 6–29)
AST: 38 U/L — ABNORMAL HIGH (ref 10–30)
Albumin: 3.3 g/dL — ABNORMAL LOW (ref 3.6–5.1)
Alkaline phosphatase (APISO): 75 U/L (ref 31–125)
BUN: 9 mg/dL (ref 7–25)
CO2: 21 mmol/L (ref 20–32)
Calcium: 8.8 mg/dL (ref 8.6–10.2)
Chloride: 99 mmol/L (ref 98–110)
Creat: 0.5 mg/dL (ref 0.50–0.96)
Globulin: 4.5 g/dL (calc) — ABNORMAL HIGH (ref 1.9–3.7)
Glucose, Bld: 168 mg/dL — ABNORMAL HIGH (ref 65–139)
Potassium: 4.1 mmol/L (ref 3.5–5.3)
Sodium: 129 mmol/L — ABNORMAL LOW (ref 135–146)
Total Bilirubin: 0.3 mg/dL (ref 0.2–1.2)
Total Protein: 7.8 g/dL (ref 6.1–8.1)

## 2021-10-21 LAB — CBC WITH DIFFERENTIAL/PLATELET
Absolute Monocytes: 570 cells/uL (ref 200–950)
Basophils Absolute: 38 cells/uL (ref 0–200)
Basophils Relative: 0.5 %
Eosinophils Absolute: 106 cells/uL (ref 15–500)
Eosinophils Relative: 1.4 %
HCT: 34.9 % — ABNORMAL LOW (ref 35.0–45.0)
Hemoglobin: 11.6 g/dL — ABNORMAL LOW (ref 11.7–15.5)
Lymphs Abs: 980 cells/uL (ref 850–3900)
MCH: 30 pg (ref 27.0–33.0)
MCHC: 33.2 g/dL (ref 32.0–36.0)
MCV: 90.2 fL (ref 80.0–100.0)
MPV: 12.3 fL (ref 7.5–12.5)
Monocytes Relative: 7.5 %
Neutro Abs: 5905 cells/uL (ref 1500–7800)
Neutrophils Relative %: 77.7 %
Platelets: 195 10*3/uL (ref 140–400)
RBC: 3.87 10*6/uL (ref 3.80–5.10)
RDW: 14.1 % (ref 11.0–15.0)
Total Lymphocyte: 12.9 %
WBC: 7.6 10*3/uL (ref 3.8–10.8)

## 2021-10-21 LAB — THYROID STIMULATING IMMUNOGLOBULIN: TSI: 89 % baseline (ref <140)

## 2021-10-21 LAB — T3, FREE: T3, Free: 3 pg/mL (ref 2.3–4.2)

## 2021-10-21 LAB — T4, FREE: Free T4: 0.9 ng/dL (ref 0.8–1.8)

## 2021-10-21 LAB — IRON: Iron: 70 ug/dL (ref 40–190)

## 2021-10-21 LAB — TSH: TSH: 0.53 mIU/L

## 2021-10-22 ENCOUNTER — Telehealth (INDEPENDENT_AMBULATORY_CARE_PROVIDER_SITE_OTHER): Payer: Self-pay

## 2021-10-22 ENCOUNTER — Telehealth (INDEPENDENT_AMBULATORY_CARE_PROVIDER_SITE_OTHER): Payer: Self-pay | Admitting: "Endocrinology

## 2021-10-22 DIAGNOSIS — D508 Other iron deficiency anemias: Secondary | ICD-10-CM

## 2021-10-22 DIAGNOSIS — E0500 Thyrotoxicosis with diffuse goiter without thyrotoxic crisis or storm: Secondary | ICD-10-CM

## 2021-10-22 DIAGNOSIS — E871 Hypo-osmolality and hyponatremia: Secondary | ICD-10-CM

## 2021-10-22 NOTE — Telephone Encounter (Signed)
My chart message with results sent to pt in response to her MyChart request for results.

## 2021-10-22 NOTE — Telephone Encounter (Signed)
Jessica Kelley called me.  She has been trying to drink her 50 ounces of water every day. She has not been adding salt to her meals. 3-5. Objective/Assessment/Plan: We reviewed her lab test results. She had iron deficiency anemia. She needs to start on iron.I asked her to call her OB to arrange for a prescription for iron.  Her serum sodium is low. I asked her to reduce her water intake to 40 ounces per day and to add salt to her food. She may be having some prenatal DI.  Her TSH and free T3 were lower. Her TSI has increased to 89. She may be having some thyroiditis.  Her AST is a bit more elevated. e. I will order repeat lab tests in 2 weeks.   Tillman Sers, MD, CDE

## 2021-10-22 NOTE — Telephone Encounter (Signed)
-----   Message from Sherrlyn Hock, MD sent at 10/22/2021  1:12 PM EDT ----- CBC shows mild anemia. CMP was normal, except sodium low at 129, albumin low at 3.3, and AST mildly elevate at 38. Globulin is elevated due to IgG treatments. Has Jessica Kelley been drinking a lot of water recently? Has she intentionally been taking in less salt? TSH and free T3 have decreased. This pattern suggests some recent thyroiditis. We need to continue to monitor the thyroid tests every month. Iron is low-normal. [name] needs to increase her iron intake. Please ask [facility] to call me tonight between 8:00-10:00 PM. Thanks.

## 2021-11-09 ENCOUNTER — Ambulatory Visit (HOSPITAL_BASED_OUTPATIENT_CLINIC_OR_DEPARTMENT_OTHER): Payer: BC Managed Care – PPO

## 2021-11-09 ENCOUNTER — Other Ambulatory Visit: Payer: Self-pay | Admitting: *Deleted

## 2021-11-09 ENCOUNTER — Encounter: Payer: Self-pay | Admitting: *Deleted

## 2021-11-09 ENCOUNTER — Ambulatory Visit: Payer: BC Managed Care – PPO

## 2021-11-09 ENCOUNTER — Other Ambulatory Visit: Payer: BC Managed Care – PPO

## 2021-11-09 ENCOUNTER — Ambulatory Visit: Payer: BC Managed Care – PPO | Attending: Obstetrics | Admitting: *Deleted

## 2021-11-09 VITALS — BP 137/78 | HR 105

## 2021-11-09 DIAGNOSIS — G6181 Chronic inflammatory demyelinating polyneuritis: Secondary | ICD-10-CM

## 2021-11-09 DIAGNOSIS — O24911 Unspecified diabetes mellitus in pregnancy, first trimester: Secondary | ICD-10-CM

## 2021-11-09 DIAGNOSIS — Q282 Arteriovenous malformation of cerebral vessels: Secondary | ICD-10-CM

## 2021-11-09 DIAGNOSIS — O24112 Pre-existing diabetes mellitus, type 2, in pregnancy, second trimester: Secondary | ICD-10-CM | POA: Diagnosis present

## 2021-11-09 DIAGNOSIS — K900 Celiac disease: Secondary | ICD-10-CM

## 2021-11-09 DIAGNOSIS — O10012 Pre-existing essential hypertension complicating pregnancy, second trimester: Secondary | ICD-10-CM

## 2021-11-09 DIAGNOSIS — O0992 Supervision of high risk pregnancy, unspecified, second trimester: Secondary | ICD-10-CM | POA: Insufficient documentation

## 2021-11-09 DIAGNOSIS — E0500 Thyrotoxicosis with diffuse goiter without thyrotoxic crisis or storm: Secondary | ICD-10-CM | POA: Diagnosis not present

## 2021-11-09 DIAGNOSIS — Z3A18 18 weeks gestation of pregnancy: Secondary | ICD-10-CM | POA: Insufficient documentation

## 2021-11-09 DIAGNOSIS — O99891 Other specified diseases and conditions complicating pregnancy: Secondary | ICD-10-CM

## 2021-11-09 DIAGNOSIS — O99612 Diseases of the digestive system complicating pregnancy, second trimester: Secondary | ICD-10-CM

## 2021-11-09 DIAGNOSIS — O99352 Diseases of the nervous system complicating pregnancy, second trimester: Secondary | ICD-10-CM

## 2021-11-09 DIAGNOSIS — O24012 Pre-existing diabetes mellitus, type 1, in pregnancy, second trimester: Secondary | ICD-10-CM

## 2021-11-09 DIAGNOSIS — Z362 Encounter for other antenatal screening follow-up: Secondary | ICD-10-CM

## 2021-11-09 DIAGNOSIS — O99282 Endocrine, nutritional and metabolic diseases complicating pregnancy, second trimester: Secondary | ICD-10-CM | POA: Diagnosis not present

## 2021-11-09 DIAGNOSIS — Z363 Encounter for antenatal screening for malformations: Secondary | ICD-10-CM | POA: Insufficient documentation

## 2021-11-09 DIAGNOSIS — O10912 Unspecified pre-existing hypertension complicating pregnancy, second trimester: Secondary | ICD-10-CM

## 2021-11-09 DIAGNOSIS — K909 Intestinal malabsorption, unspecified: Secondary | ICD-10-CM

## 2021-11-13 LAB — CBC WITH DIFFERENTIAL/PLATELET
Absolute Monocytes: 640 cells/uL (ref 200–950)
Basophils Absolute: 41 cells/uL (ref 0–200)
Basophils Relative: 0.5 %
Eosinophils Absolute: 57 cells/uL (ref 15–500)
Eosinophils Relative: 0.7 %
HCT: 33 % — ABNORMAL LOW (ref 35.0–45.0)
Hemoglobin: 10.8 g/dL — ABNORMAL LOW (ref 11.7–15.5)
Lymphs Abs: 1110 cells/uL (ref 850–3900)
MCH: 29.3 pg (ref 27.0–33.0)
MCHC: 32.7 g/dL (ref 32.0–36.0)
MCV: 89.7 fL (ref 80.0–100.0)
MPV: 11.8 fL (ref 7.5–12.5)
Monocytes Relative: 7.9 %
Neutro Abs: 6253 cells/uL (ref 1500–7800)
Neutrophils Relative %: 77.2 %
Platelets: 187 10*3/uL (ref 140–400)
RBC: 3.68 10*6/uL — ABNORMAL LOW (ref 3.80–5.10)
RDW: 13.4 % (ref 11.0–15.0)
Total Lymphocyte: 13.7 %
WBC: 8.1 10*3/uL (ref 3.8–10.8)

## 2021-11-13 LAB — COMPREHENSIVE METABOLIC PANEL
AG Ratio: 0.5 (calc) — ABNORMAL LOW (ref 1.0–2.5)
ALT: 19 U/L (ref 6–29)
AST: 28 U/L (ref 10–30)
Albumin: 2.9 g/dL — ABNORMAL LOW (ref 3.6–5.1)
Alkaline phosphatase (APISO): 68 U/L (ref 31–125)
BUN/Creatinine Ratio: 16 (calc) (ref 6–22)
BUN: 7 mg/dL (ref 7–25)
CO2: 22 mmol/L (ref 20–32)
Calcium: 8.5 mg/dL — ABNORMAL LOW (ref 8.6–10.2)
Chloride: 103 mmol/L (ref 98–110)
Creat: 0.43 mg/dL — ABNORMAL LOW (ref 0.50–0.96)
Globulin: 5.3 g/dL (calc) — ABNORMAL HIGH (ref 1.9–3.7)
Glucose, Bld: 94 mg/dL (ref 65–139)
Potassium: 3.9 mmol/L (ref 3.5–5.3)
Sodium: 132 mmol/L — ABNORMAL LOW (ref 135–146)
Total Bilirubin: 0.2 mg/dL (ref 0.2–1.2)
Total Protein: 8.2 g/dL — ABNORMAL HIGH (ref 6.1–8.1)

## 2021-11-13 LAB — T3, FREE: T3, Free: 3.4 pg/mL (ref 2.3–4.2)

## 2021-11-13 LAB — THYROID STIMULATING IMMUNOGLOBULIN: TSI: <89 % baseline (ref <140)

## 2021-11-13 LAB — T4, FREE: Free T4: 0.9 ng/dL (ref 0.8–1.8)

## 2021-11-13 LAB — OSMOLALITY: Osmolality: 278 mOsm/kg (ref 278–305)

## 2021-11-13 LAB — IRON: Iron: 56 ug/dL (ref 40–190)

## 2021-11-13 LAB — TSH: TSH: 1.04 mIU/L

## 2021-11-16 ENCOUNTER — Ambulatory Visit: Payer: BC Managed Care – PPO

## 2021-11-16 ENCOUNTER — Other Ambulatory Visit: Payer: BC Managed Care – PPO

## 2021-12-07 ENCOUNTER — Other Ambulatory Visit: Payer: Self-pay | Admitting: *Deleted

## 2021-12-07 ENCOUNTER — Ambulatory Visit: Payer: BC Managed Care – PPO | Admitting: *Deleted

## 2021-12-07 ENCOUNTER — Ambulatory Visit: Payer: BC Managed Care – PPO | Attending: Obstetrics and Gynecology

## 2021-12-07 VITALS — BP 128/75 | HR 99

## 2021-12-07 DIAGNOSIS — E109 Type 1 diabetes mellitus without complications: Secondary | ICD-10-CM | POA: Diagnosis not present

## 2021-12-07 DIAGNOSIS — Z362 Encounter for other antenatal screening follow-up: Secondary | ICD-10-CM | POA: Insufficient documentation

## 2021-12-07 DIAGNOSIS — O10912 Unspecified pre-existing hypertension complicating pregnancy, second trimester: Secondary | ICD-10-CM | POA: Insufficient documentation

## 2021-12-07 DIAGNOSIS — E0500 Thyrotoxicosis with diffuse goiter without thyrotoxic crisis or storm: Secondary | ICD-10-CM | POA: Diagnosis present

## 2021-12-07 DIAGNOSIS — O99612 Diseases of the digestive system complicating pregnancy, second trimester: Secondary | ICD-10-CM

## 2021-12-07 DIAGNOSIS — O99282 Endocrine, nutritional and metabolic diseases complicating pregnancy, second trimester: Secondary | ICD-10-CM | POA: Diagnosis not present

## 2021-12-07 DIAGNOSIS — Z3A22 22 weeks gestation of pregnancy: Secondary | ICD-10-CM

## 2021-12-07 DIAGNOSIS — K909 Intestinal malabsorption, unspecified: Secondary | ICD-10-CM | POA: Insufficient documentation

## 2021-12-07 DIAGNOSIS — K900 Celiac disease: Secondary | ICD-10-CM

## 2021-12-07 DIAGNOSIS — O99891 Other specified diseases and conditions complicating pregnancy: Secondary | ICD-10-CM

## 2021-12-07 DIAGNOSIS — O24012 Pre-existing diabetes mellitus, type 1, in pregnancy, second trimester: Secondary | ICD-10-CM | POA: Diagnosis not present

## 2021-12-07 DIAGNOSIS — Q282 Arteriovenous malformation of cerebral vessels: Secondary | ICD-10-CM | POA: Insufficient documentation

## 2021-12-07 DIAGNOSIS — O99352 Diseases of the nervous system complicating pregnancy, second trimester: Secondary | ICD-10-CM

## 2021-12-07 DIAGNOSIS — O24912 Unspecified diabetes mellitus in pregnancy, second trimester: Secondary | ICD-10-CM

## 2021-12-07 DIAGNOSIS — G6181 Chronic inflammatory demyelinating polyneuritis: Secondary | ICD-10-CM

## 2021-12-07 DIAGNOSIS — O10012 Pre-existing essential hypertension complicating pregnancy, second trimester: Secondary | ICD-10-CM

## 2021-12-07 NOTE — Progress Notes (Signed)
1.25 

## 2021-12-14 LAB — TSH: TSH: 1.02 mIU/L

## 2021-12-14 LAB — T3, FREE: T3, Free: 3.3 pg/mL (ref 2.3–4.2)

## 2021-12-17 ENCOUNTER — Telehealth (INDEPENDENT_AMBULATORY_CARE_PROVIDER_SITE_OTHER): Payer: Self-pay | Admitting: "Endocrinology

## 2021-12-17 DIAGNOSIS — E1065 Type 1 diabetes mellitus with hyperglycemia: Secondary | ICD-10-CM

## 2021-12-17 NOTE — Telephone Encounter (Signed)
Who's calling (name and relationship to patient) : Anayely Constantine; self  Best contact number: [phone]  Provider they see: Dr. Tobe Sos   Reason for call: Colletta [place] called in needing prescription(Humalog Vials) sent to [address], [place]  Call ID:      [place]  Name of prescription:  Pharmacy:

## 2021-12-18 MED ORDER — INSULIN LISPRO 100 UNIT/ML IJ SOLN: INTRAMUSCULAR | 3 refills | Status: DC

## 2021-12-19 ENCOUNTER — Other Ambulatory Visit (INDEPENDENT_AMBULATORY_CARE_PROVIDER_SITE_OTHER): Payer: Self-pay | Admitting: "Endocrinology

## 2021-12-19 DIAGNOSIS — E1065 Type 1 diabetes mellitus with hyperglycemia: Secondary | ICD-10-CM

## 2021-12-20 ENCOUNTER — Encounter (INDEPENDENT_AMBULATORY_CARE_PROVIDER_SITE_OTHER): Payer: Self-pay

## 2021-12-24 ENCOUNTER — Telehealth (INDEPENDENT_AMBULATORY_CARE_PROVIDER_SITE_OTHER): Payer: Self-pay | Admitting: Pharmacist

## 2021-12-24 ENCOUNTER — Other Ambulatory Visit: Payer: Self-pay | Admitting: "Endocrinology

## 2021-12-24 DIAGNOSIS — E1065 Type 1 diabetes mellitus with hyperglycemia: Secondary | ICD-10-CM

## 2021-12-24 MED ORDER — DEXCOM G6 SENSOR MISC: 0 refills | Status: DC

## 2021-12-24 NOTE — Telephone Encounter (Signed)
Sent script to requested pharmacy

## 2021-12-24 NOTE — Telephone Encounter (Signed)
Who's calling (name and relationship to patient) : Ayame Rena; self   Best contact number: [phone]  Provider they see: Leafy Ro  Reason for call: Shelene has called in wanting to leave a message for Lovena Le, stating that she needs more Dexcom sensors and that the sensors can be sent to [place], [place].    Call ID:      PRESCRIPTION REFILL ONLY  Name of prescription:  Pharmacy:

## 2021-12-30 ENCOUNTER — Encounter (HOSPITAL_COMMUNITY): Payer: Self-pay | Admitting: Obstetrics and Gynecology

## 2021-12-30 ENCOUNTER — Inpatient Hospital Stay (HOSPITAL_COMMUNITY)
Admission: AD | Admit: 2021-12-30 | Discharge: 2021-12-30 | Disposition: A | Discharge status: Home / Self Care | Payer: BC Managed Care – PPO | Attending: Obstetrics and Gynecology | Admitting: Obstetrics and Gynecology

## 2021-12-30 DIAGNOSIS — H539 Unspecified visual disturbance: Secondary | ICD-10-CM | POA: Diagnosis not present

## 2021-12-30 DIAGNOSIS — O10912 Unspecified pre-existing hypertension complicating pregnancy, second trimester: Secondary | ICD-10-CM | POA: Diagnosis present

## 2021-12-30 DIAGNOSIS — O99352 Diseases of the nervous system complicating pregnancy, second trimester: Secondary | ICD-10-CM | POA: Diagnosis present

## 2021-12-30 DIAGNOSIS — H409 Unspecified glaucoma: Secondary | ICD-10-CM | POA: Insufficient documentation

## 2021-12-30 DIAGNOSIS — E0590 Thyrotoxicosis, unspecified without thyrotoxic crisis or storm: Secondary | ICD-10-CM | POA: Insufficient documentation

## 2021-12-30 DIAGNOSIS — R21 Rash and other nonspecific skin eruption: Secondary | ICD-10-CM | POA: Diagnosis not present

## 2021-12-30 DIAGNOSIS — Z3A25 25 weeks gestation of pregnancy: Secondary | ICD-10-CM

## 2021-12-30 DIAGNOSIS — O99282 Endocrine, nutritional and metabolic diseases complicating pregnancy, second trimester: Secondary | ICD-10-CM | POA: Insufficient documentation

## 2021-12-30 DIAGNOSIS — Z79899 Other long term (current) drug therapy: Secondary | ICD-10-CM | POA: Insufficient documentation

## 2021-12-30 DIAGNOSIS — O24012 Pre-existing diabetes mellitus, type 1, in pregnancy, second trimester: Secondary | ICD-10-CM | POA: Insufficient documentation

## 2021-12-30 LAB — CBC
HCT: 31.7 % — ABNORMAL LOW (ref 36.0–46.0)
Hemoglobin: 10.6 g/dL — ABNORMAL LOW (ref 12.0–15.0)
MCH: 29.5 pg (ref 26.0–34.0)
MCHC: 33.4 g/dL (ref 30.0–36.0)
MCV: 88.3 fL (ref 80.0–100.0)
Platelets: 176 10*3/uL (ref 150–400)
RBC: 3.59 MIL/uL — ABNORMAL LOW (ref 3.87–5.11)
RDW: 14.1 % (ref 11.5–15.5)
WBC: 7.7 10*3/uL (ref 4.0–10.5)
nRBC: 0 % (ref 0.0–0.2)

## 2021-12-30 LAB — URINALYSIS, ROUTINE W REFLEX MICROSCOPIC
Bilirubin Urine: NEGATIVE
Glucose, UA: NEGATIVE mg/dL
Hgb urine dipstick: NEGATIVE
Ketones, ur: NEGATIVE mg/dL
Nitrite: NEGATIVE
Protein, ur: NEGATIVE mg/dL
Specific Gravity, Urine: 1.012 (ref 1.005–1.030)
pH: 7 (ref 5.0–8.0)

## 2021-12-30 LAB — COMPREHENSIVE METABOLIC PANEL
ALT: 21 U/L (ref 0–44)
AST: 36 U/L (ref 15–41)
Albumin: 2.6 g/dL — ABNORMAL LOW (ref 3.5–5.0)
Alkaline Phosphatase: 85 U/L (ref 38–126)
Anion gap: 7 (ref 5–15)
BUN: 5 mg/dL — ABNORMAL LOW (ref 6–20)
CO2: 23 mmol/L (ref 22–32)
Calcium: 8.8 mg/dL — ABNORMAL LOW (ref 8.9–10.3)
Chloride: 102 mmol/L (ref 98–111)
Creatinine, Ser: 0.47 mg/dL (ref 0.44–1.00)
GFR, Estimated: >60 mL/min (ref >60)
Glucose, Bld: 194 mg/dL — ABNORMAL HIGH (ref 70–99)
Potassium: 3.5 mmol/L (ref 3.5–5.1)
Sodium: 132 mmol/L — ABNORMAL LOW (ref 135–145)
Total Bilirubin: 0.1 mg/dL — ABNORMAL LOW (ref 0.3–1.2)
Total Protein: 8.1 g/dL (ref 6.5–8.1)

## 2021-12-30 LAB — PROTEIN / CREATININE RATIO, URINE
Creatinine, Urine: 54 mg/dL
Protein Creatinine Ratio: 0.3 mg/mg{Cre} — ABNORMAL HIGH (ref 0.00–0.15)
Total Protein, Urine: 16 mg/dL

## 2021-12-30 NOTE — MAU Provider Note (Signed)
History     [id number]  Arrival date and time: 12/30/21 1546    Chief Complaint  Patient presents with   Eye Problem     HPI Jessica Kelley is a 31 y.o. at 31w4dwho presents for visual floaters.  Symptoms started last night.  Reports gray spots in her right eye.  She is blind in her left eye.  Denies headache or epigastric pain.  Has chronic hypertension which she takes labetalol 200 twice daily for.  Called after hours line and was instructed to come to MAU to evaluate for preeclampsia. Denies contractions, vaginal bleeding, leaking of fluid.  Reports good fetal movement.   OB History     Gravida  2   Para      Term      Preterm      AB  1   Living         SAB  1   IAB      Ectopic      Multiple      Live Births              Past Medical History:  Diagnosis Date   Brain bleed ([facility]    Patient stated   Celiac disease    [place]P ([place]   Diabetes type I ([facility]    Dr. BTobe Sos  Glaucoma    Hyperthyroidism    Macular degeneration    Pilonidal cyst    Scoliosis    Wyburn-Mason syndrome     Past Surgical History:  Procedure Laterality Date   CATARACT EXTRACTION  06/25/06   FOOT ARTHROPLASTY     left foot moved and stretched tendons    GLAUCOMA SURGERY  02/12/06   HIP ARTHROPLASTY     right never bipopsy   PORTACATH PLACEMENT  2002-2004   removal of vascular port     Sciatic Nerve Muscle Biopsy      Family History  Problem Relation Age of Onset   Thyroid disease Mother    Diabetes Sister    Other Sister        thyroid issues   Heart disease Maternal Grandmother    Hypertension Maternal Grandfather    Prostate cancer Maternal Grandfather    Cancer Maternal Grandfather        prostate   Cancer Paternal Grandmother        Died at 685- kidney   Heart disease Paternal Grandfather    Coronary artery disease Paternal Grandfather    Hyperlipidemia Other    Asthma Neg Hx    Birth  defects Neg Hx    Kidney disease Neg Hx    Stroke Neg Hx     Allergies  Allergen Reactions   Gluten Meal    Wheat Bran    Ginger Rash    No current facility-administered medications on file prior to encounter.   Current Outpatient Medications on File Prior to Encounter  Medication Sig Dispense Refill   methylPREDNISolone sodium succinate (SOLU-MEDROL) 1000 MG injection Inject 500 mg into the vein once. Once a week     Continuous Blood Gluc Sensor (DEXCOM G6 SENSOR) MISC APPLY 1 SENSOR INTO THE SKIN AS DIRECTED CHANGE SENSORS EVERY 10 DAYS 3 each 0   Continuous Blood Gluc Transmit (DEXCOM G6 TRANSMITTER) MISC Use with sensor, Change ever 90 days 1 each 3   Glucagon (BAQSIMI TWO PACK) 3 MG/DOSE POWD Place 1 spray into the nose as directed. (Patient not taking:  Reported on 08/17/2021) 2 each 3   immune globulin, human, (GAMMAGARD S/D LESS IGA) 10 g injection Inject into the vein.     insulin degludec (TRESIBA FLEXTOUCH) 100 UNIT/ML FlexTouch Pen Inject up to 50 units daily per provider instructions. Please place on hold until patient requests. (Patient not taking: Reported on 11/09/2021) 15 mL 3   insulin glargine-yfgn (SEMGLEE, YFGN,) 100 UNIT/ML Pen INJECT 50 UNITS SUBCUTANEOUSLY ONCE DAILY (Patient not taking: Reported on 09/21/2021) 45 mL 0   insulin lispro (HUMALOG KWIKPEN) 100 UNIT/ML KwikPen Inject up to 50 units daily per provider guidance. Keep on file in case insulin pump breaks. 15 mL 3   insulin lispro (HUMALOG) 100 UNIT/ML injection Inject up to 300 units into insulin pump every 2 days. Fill for VIAL. Please fill for 90 day supply. 150 mL 3   labetalol (NORMODYNE) 200 MG tablet Take 200 mg by mouth 2 (two) times daily.     NOVOLOG FLEXPEN 100 UNIT/ML FlexPen Inject up to 50 units daily 45 mL 4   NOVOLOG RELION 100 UNIT/ML injection INJECT UP TO 300 UNITS VIA INSULIN PUMP EVERY 2 DAYS 50 mL 3   Prenatal Vit-Fe Fumarate-FA (PRENATAL MULTIVITAMIN) TABS tablet Take 1 tablet by mouth  daily at 12 noon.     propylthiouracil (PTU) 50 MG tablet Take 4 tablets three times daily. (Patient not taking: Reported on 09/21/2021) 360 tablet 5   SEMGLEE, YFGN, 100 UNIT/ML Pen Inject into the skin. (Patient not taking: Reported on 12/07/2021)       ROS Pertinent positives and negative per HPI, all others reviewed and negative  Physical Exam   BP 133/83   Pulse (!) 102   Temp 98.1 F (36.7 C) (Oral)   Resp 14   Ht 5' 5"  (1.651 m)   Wt 80.7 kg   LMP 07/04/2021   SpO2 97%   BMI 29.62 kg/m   Patient Vitals for the past 24 hrs:  BP Temp Temp src Pulse Resp SpO2 Height Weight  12/30/21 1701 133/83 -- -- (!) 102 -- -- -- --  12/30/21 1700 -- -- -- -- -- 97 % -- --  12/30/21 1655 -- -- -- -- -- 98 % -- --  12/30/21 1650 -- -- -- -- -- 98 % -- --  12/30/21 1646 121/78 -- -- (!) 101 -- -- -- --  12/30/21 1645 -- -- -- -- -- 98 % -- --  12/30/21 1642 130/85 -- -- 95 -- -- -- --  12/30/21 1640 -- -- -- -- -- 98 % -- --  12/30/21 1635 -- -- -- -- -- 98 % -- --  12/30/21 1630 -- -- -- -- -- 97 % -- --  12/30/21 1625 -- -- -- -- -- 96 % -- --  12/30/21 1620 -- -- -- -- -- 97 % -- --  12/30/21 1617 -- -- -- -- -- 98 % -- 80.7 kg  12/30/21 1615 120/71 98.1 F (36.7 C) Oral (!) 115 14 98 % 5' 5"  (1.651 m) --    Physical Exam Vitals and nursing note reviewed.  Constitutional:      General: She is not in acute distress.    Appearance: Normal appearance.  HENT:     Head: Normocephalic and atraumatic.  Eyes:     General: No scleral icterus.       Right eye: No discharge.     Extraocular Movements: Extraocular movements intact.     Conjunctiva/sclera: Conjunctivae normal.     Pupils:  Pupils are equal, round, and reactive to light.  Cardiovascular:     Rate and Rhythm: Normal rate and regular rhythm.     Heart sounds: Normal heart sounds.  Pulmonary:     Effort: Pulmonary effort is normal. No respiratory distress.     Breath sounds: Normal breath sounds. No wheezing.   Musculoskeletal:     Right lower leg: No edema.     Left lower leg: No edema.  Neurological:     Mental Status: She is alert.     Deep Tendon Reflexes:     Reflex Scores:      Patellar reflexes are 2+ on the left side.    Comments: No clonus        FHT Baseline 145, moderate variability, no accels, no decels Toco: none Cat: 1  Labs Results for orders placed or performed during the hospital encounter of 12/30/21 (from the past 24 hour(s))  Urinalysis, Routine w reflex microscopic Urine, Clean Catch     Status: Abnormal   Collection Time: 12/30/21  5:08 PM  Result Value Ref Range   Color, Urine YELLOW YELLOW   APPearance HAZY (A) CLEAR   Specific Gravity, Urine 1.012 1.005 - 1.030   pH 7.0 5.0 - 8.0   Glucose, UA NEGATIVE NEGATIVE mg/dL   Hgb urine dipstick NEGATIVE NEGATIVE   Bilirubin Urine NEGATIVE NEGATIVE   Ketones, ur NEGATIVE NEGATIVE mg/dL   Protein, ur NEGATIVE NEGATIVE mg/dL   Nitrite NEGATIVE NEGATIVE   Leukocytes,Ua MODERATE (A) NEGATIVE   RBC / HPF 0-5 0 - 5 RBC/hpf   WBC, UA 11-20 0 - 5 WBC/hpf   Bacteria, UA RARE (A) NONE SEEN   Squamous Epithelial / LPF 6-10 0 - 5   Non Squamous Epithelial 0-5 (A) NONE SEEN  CBC     Status: Abnormal   Collection Time: 12/30/21  5:40 PM  Result Value Ref Range   WBC 7.7 4.0 - 10.5 K/uL   RBC 3.59 (L) 3.87 - 5.11 MIL/uL   Hemoglobin 10.6 (L) 12.0 - 15.0 g/dL   HCT 31.7 (L) 36.0 - 46.0 %   MCV 88.3 80.0 - 100.0 fL   MCH 29.5 26.0 - 34.0 pg   MCHC 33.4 30.0 - 36.0 g/dL   RDW 14.1 11.5 - 15.5 %   Platelets 176 150 - 400 K/uL   nRBC 0.0 0.0 - 0.2 %  Comprehensive metabolic panel     Status: Abnormal   Collection Time: 12/30/21  5:40 PM  Result Value Ref Range   Sodium 132 (L) 135 - 145 mmol/L   Potassium 3.5 3.5 - 5.1 mmol/L   Chloride 102 98 - 111 mmol/L   CO2 23 22 - 32 mmol/L   Glucose, Bld 194 (H) 70 - 99 mg/dL   BUN 5 (L) 6 - 20 mg/dL   Creatinine, Ser 0.47 0.44 - 1.00 mg/dL   Calcium 8.8 (L) 8.9 - 10.3  mg/dL   Total Protein 8.1 6.5 - 8.1 g/dL   Albumin 2.6 (L) 3.5 - 5.0 g/dL   AST 36 15 - 41 U/L   ALT 21 0 - 44 U/L   Alkaline Phosphatase 85 38 - 126 U/L   Total Bilirubin 0.1 (L) 0.3 - 1.2 mg/dL   GFR, Estimated >60 >60 mL/min   Anion gap 7 5 - 15    Imaging No results found.  MAU Course  Procedures Lab Orders         Urinalysis, Routine w reflex microscopic Urine,  Clean Catch         CBC         Comprehensive metabolic panel         Protein / creatinine ratio, urine    No orders of the defined types were placed in this encounter.  Imaging Orders  No imaging studies ordered today    MDM Reviewed prenatal records scanned under media - patient with CHTN, DM, Wyburn-Mason, & CIDP. BPs stable on Labetalol.  Labs ordered - results reviewed Chronic hypertension stable Assessment and Plan   1. Episode of visual disturbance  -LFTs & platelets normal. Patient remained normotensive while in MAU. Urine PCR unable to be resulted per lab but u/a shows no protein. Confident patient does not have si preeclampsia at this time. Reviewed s/s of preeclampsia & reasons to return. Patient may consider f/u with ophthalmology if symptoms continue or worsen  2. Chronic hypertension in obstetric context in second trimester   3. [redacted] weeks gestation of pregnancy      Jorje Guild, NP 12/30/21 6:57 PM

## 2021-12-30 NOTE — MAU Note (Signed)
.  Jessica Kelley is a 31 y.o. at 76w4dhere in MAU reporting: Floater in vision, and cramping in abdomen, and a very mild headache that has been on and off for a couple of months.  LMP: 04/10/2022 Onset of complaint: 12/29/21 @ 12noon Pain score: 0/10 Vitals:   12/30/21 1615 12/30/21 1617  BP: 120/71   Pulse: (!) 115   Resp: 14   Temp: 98.1 F (36.7 C)   SpO2:  98%     FHT:150 Lab orders placed: UA

## 2021-12-30 NOTE — Discharge Instructions (Signed)
Follow up with your ophthalmologist if symptoms don't improve or worsen

## 2021-12-30 NOTE — Progress Notes (Addendum)
Upon initial assessment. Patient stated that her blood glucose was dropping and she needed something to drink and snack on. RN was informed by pt that her blood sugar was 90, and dropping via her dexcon. Apple juice and Graham crackers given to patient. Provider made aware.

## 2022-01-07 ENCOUNTER — Ambulatory Visit: Payer: BC Managed Care – PPO | Attending: Obstetrics

## 2022-01-07 ENCOUNTER — Other Ambulatory Visit: Payer: Self-pay | Admitting: Obstetrics

## 2022-01-07 ENCOUNTER — Ambulatory Visit: Payer: BC Managed Care – PPO | Admitting: *Deleted

## 2022-01-07 ENCOUNTER — Telehealth (INDEPENDENT_AMBULATORY_CARE_PROVIDER_SITE_OTHER): Payer: Self-pay

## 2022-01-07 ENCOUNTER — Encounter (INDEPENDENT_AMBULATORY_CARE_PROVIDER_SITE_OTHER): Payer: Self-pay

## 2022-01-07 ENCOUNTER — Encounter: Payer: Self-pay | Admitting: *Deleted

## 2022-01-07 VITALS — BP 119/78 | HR 107

## 2022-01-07 DIAGNOSIS — O24912 Unspecified diabetes mellitus in pregnancy, second trimester: Secondary | ICD-10-CM

## 2022-01-07 DIAGNOSIS — O99891 Other specified diseases and conditions complicating pregnancy: Secondary | ICD-10-CM

## 2022-01-07 DIAGNOSIS — O24012 Pre-existing diabetes mellitus, type 1, in pregnancy, second trimester: Secondary | ICD-10-CM | POA: Insufficient documentation

## 2022-01-07 DIAGNOSIS — O99282 Endocrine, nutritional and metabolic diseases complicating pregnancy, second trimester: Secondary | ICD-10-CM | POA: Diagnosis not present

## 2022-01-07 DIAGNOSIS — O365930 Maternal care for other known or suspected poor fetal growth, third trimester, not applicable or unspecified: Secondary | ICD-10-CM | POA: Diagnosis not present

## 2022-01-07 DIAGNOSIS — O10912 Unspecified pre-existing hypertension complicating pregnancy, second trimester: Secondary | ICD-10-CM | POA: Diagnosis present

## 2022-01-07 DIAGNOSIS — O10012 Pre-existing essential hypertension complicating pregnancy, second trimester: Secondary | ICD-10-CM

## 2022-01-07 DIAGNOSIS — O99612 Diseases of the digestive system complicating pregnancy, second trimester: Secondary | ICD-10-CM

## 2022-01-07 DIAGNOSIS — K900 Celiac disease: Secondary | ICD-10-CM

## 2022-01-07 DIAGNOSIS — E119 Type 2 diabetes mellitus without complications: Secondary | ICD-10-CM | POA: Diagnosis not present

## 2022-01-07 DIAGNOSIS — O24112 Pre-existing diabetes mellitus, type 2, in pregnancy, second trimester: Secondary | ICD-10-CM

## 2022-01-07 DIAGNOSIS — Z3A26 26 weeks gestation of pregnancy: Secondary | ICD-10-CM

## 2022-01-07 DIAGNOSIS — E0500 Thyrotoxicosis with diffuse goiter without thyrotoxic crisis or storm: Secondary | ICD-10-CM

## 2022-01-07 DIAGNOSIS — G610 Guillain-Barre syndrome: Secondary | ICD-10-CM

## 2022-01-07 DIAGNOSIS — O99352 Diseases of the nervous system complicating pregnancy, second trimester: Secondary | ICD-10-CM

## 2022-01-07 DIAGNOSIS — Q282 Arteriovenous malformation of cerebral vessels: Secondary | ICD-10-CM

## 2022-01-07 NOTE — Telephone Encounter (Signed)
Sent my chart message and left vm with call back number.

## 2022-01-07 NOTE — Telephone Encounter (Signed)
-----   Message from Sherrlyn Hock, MD sent at 01/06/2022  6:52 PM EDT ----- CBC showed worsening anemia Sodium was a bit low, but better. Calcium was low because her albumin was low.  Total protein and globulins were elevated due to her IV Ig treatments Thyroid tests were normal. TSI was normal. Serum osmolality was normal, but low-normal. Iron was technically normal, but really too low for her. She needs more iron.   Clinical staff; Please inform Jessica Kelley of the above and ask her to call me tomorrow evening between 8-10 PM to discuss her issues. Thanks. Dr. Tobe Sos

## 2022-01-08 ENCOUNTER — Other Ambulatory Visit: Payer: Self-pay | Admitting: *Deleted

## 2022-01-08 DIAGNOSIS — O10912 Unspecified pre-existing hypertension complicating pregnancy, second trimester: Secondary | ICD-10-CM

## 2022-01-08 DIAGNOSIS — Q282 Arteriovenous malformation of cerebral vessels: Secondary | ICD-10-CM

## 2022-01-08 DIAGNOSIS — O365921 Maternal care for other known or suspected poor fetal growth, second trimester, fetus 1: Secondary | ICD-10-CM

## 2022-01-14 ENCOUNTER — Telehealth (INDEPENDENT_AMBULATORY_CARE_PROVIDER_SITE_OTHER): Payer: Self-pay | Admitting: "Endocrinology

## 2022-01-14 DIAGNOSIS — E1065 Type 1 diabetes mellitus with hyperglycemia: Secondary | ICD-10-CM

## 2022-01-14 MED ORDER — DEXCOM G6 SENSOR MISC: 6 refills | Status: DC

## 2022-01-14 MED ORDER — DEXCOM G6 TRANSMITTER MISC: 3 refills | Status: DC

## 2022-01-14 NOTE — Telephone Encounter (Signed)
  Name of who is calling: Lenise Arena Relationship to Patient: self  Best contact number: [phone]  Provider they see: Dr. Jene Every  Reason for call: Pt needs dexcom transmitter sent to another pharmacy     PRESCRIPTION REFILL ONLY  Name of prescription: Dexcom Transmitter  Pharmacy: [facility] [address] in [place]

## 2022-01-22 ENCOUNTER — Ambulatory Visit (HOSPITAL_BASED_OUTPATIENT_CLINIC_OR_DEPARTMENT_OTHER): Payer: BC Managed Care – PPO | Admitting: *Deleted

## 2022-01-22 ENCOUNTER — Other Ambulatory Visit: Payer: Self-pay | Admitting: Maternal & Fetal Medicine

## 2022-01-22 ENCOUNTER — Other Ambulatory Visit: Payer: Self-pay | Admitting: *Deleted

## 2022-01-22 ENCOUNTER — Ambulatory Visit: Payer: BC Managed Care – PPO | Attending: Obstetrics and Gynecology

## 2022-01-22 ENCOUNTER — Ambulatory Visit: Payer: BC Managed Care – PPO | Admitting: *Deleted

## 2022-01-22 ENCOUNTER — Encounter: Payer: Self-pay | Admitting: *Deleted

## 2022-01-22 DIAGNOSIS — O365930 Maternal care for other known or suspected poor fetal growth, third trimester, not applicable or unspecified: Secondary | ICD-10-CM | POA: Insufficient documentation

## 2022-01-22 DIAGNOSIS — O24419 Gestational diabetes mellitus in pregnancy, unspecified control: Secondary | ICD-10-CM | POA: Diagnosis not present

## 2022-01-22 DIAGNOSIS — O99353 Diseases of the nervous system complicating pregnancy, third trimester: Secondary | ICD-10-CM | POA: Diagnosis not present

## 2022-01-22 DIAGNOSIS — Z3A29 29 weeks gestation of pregnancy: Secondary | ICD-10-CM

## 2022-01-22 DIAGNOSIS — Q282 Arteriovenous malformation of cerebral vessels: Secondary | ICD-10-CM | POA: Insufficient documentation

## 2022-01-22 DIAGNOSIS — O10912 Unspecified pre-existing hypertension complicating pregnancy, second trimester: Secondary | ICD-10-CM | POA: Insufficient documentation

## 2022-01-22 DIAGNOSIS — O365921 Maternal care for other known or suspected poor fetal growth, second trimester, fetus 1: Secondary | ICD-10-CM | POA: Diagnosis present

## 2022-01-22 DIAGNOSIS — O365990 Maternal care for other known or suspected poor fetal growth, unspecified trimester, not applicable or unspecified: Secondary | ICD-10-CM

## 2022-01-22 DIAGNOSIS — O10013 Pre-existing essential hypertension complicating pregnancy, third trimester: Secondary | ICD-10-CM

## 2022-01-22 DIAGNOSIS — G6181 Chronic inflammatory demyelinating polyneuritis: Secondary | ICD-10-CM

## 2022-01-22 NOTE — Procedures (Signed)
Jessica Kelley January 29, 1991 [redacted]w[redacted]d Fetus A Non-Stress Test Interpretation for 01/22/22  Indication: IUGR  Fetal Heart Rate A Mode: External Baseline Rate (A): 150 bpm Variability: Minimal, Moderate Accelerations: 10 x 10 Decelerations: Variable Multiple birth?: No  Uterine Activity Mode: Palpation, Toco Contraction Frequency (min): none Resting Tone Palpated: Relaxed  Interpretation (Fetal Testing) Nonstress Test Interpretation: Reactive Overall Impression: Reassuring for gestational age Comments: Dr. SEpimenio Sarinreviewed tracing

## 2022-01-28 ENCOUNTER — Encounter: Payer: Self-pay | Admitting: *Deleted

## 2022-01-28 ENCOUNTER — Ambulatory Visit: Payer: BC Managed Care – PPO | Attending: Obstetrics

## 2022-01-28 ENCOUNTER — Ambulatory Visit: Payer: BC Managed Care – PPO | Admitting: *Deleted

## 2022-01-28 ENCOUNTER — Ambulatory Visit (HOSPITAL_BASED_OUTPATIENT_CLINIC_OR_DEPARTMENT_OTHER): Payer: BC Managed Care – PPO | Admitting: *Deleted

## 2022-01-28 DIAGNOSIS — O99283 Endocrine, nutritional and metabolic diseases complicating pregnancy, third trimester: Secondary | ICD-10-CM

## 2022-01-28 DIAGNOSIS — E0500 Thyrotoxicosis with diffuse goiter without thyrotoxic crisis or storm: Secondary | ICD-10-CM

## 2022-01-28 DIAGNOSIS — O365930 Maternal care for other known or suspected poor fetal growth, third trimester, not applicable or unspecified: Secondary | ICD-10-CM

## 2022-01-28 DIAGNOSIS — G6181 Chronic inflammatory demyelinating polyneuritis: Secondary | ICD-10-CM

## 2022-01-28 DIAGNOSIS — O10013 Pre-existing essential hypertension complicating pregnancy, third trimester: Secondary | ICD-10-CM

## 2022-01-28 DIAGNOSIS — Z3A29 29 weeks gestation of pregnancy: Secondary | ICD-10-CM | POA: Insufficient documentation

## 2022-01-28 DIAGNOSIS — O365921 Maternal care for other known or suspected poor fetal growth, second trimester, fetus 1: Secondary | ICD-10-CM | POA: Diagnosis present

## 2022-01-28 DIAGNOSIS — O99353 Diseases of the nervous system complicating pregnancy, third trimester: Secondary | ICD-10-CM

## 2022-01-28 DIAGNOSIS — O24113 Pre-existing diabetes mellitus, type 2, in pregnancy, third trimester: Secondary | ICD-10-CM

## 2022-01-28 DIAGNOSIS — O10919 Unspecified pre-existing hypertension complicating pregnancy, unspecified trimester: Secondary | ICD-10-CM

## 2022-01-28 DIAGNOSIS — O10912 Unspecified pre-existing hypertension complicating pregnancy, second trimester: Secondary | ICD-10-CM | POA: Insufficient documentation

## 2022-01-28 DIAGNOSIS — Q282 Arteriovenous malformation of cerebral vessels: Secondary | ICD-10-CM | POA: Diagnosis present

## 2022-01-28 DIAGNOSIS — O24313 Unspecified pre-existing diabetes mellitus in pregnancy, third trimester: Secondary | ICD-10-CM | POA: Diagnosis not present

## 2022-01-28 NOTE — Procedures (Signed)
Jessica Kelley [birth year] [redacted]w[redacted]d Fetus A Non-Stress Test Interpretation for 01/28/22  Indication: IUGR and Diabetes  Fetal Heart Rate A Mode: External Baseline Rate (A): 150 bpm Variability: Moderate Accelerations: 10 x 10 Decelerations: None Multiple birth?: No  Uterine Activity Mode: Toco Contraction Frequency (min): none Contraction Quality: Mild Resting Tone Palpated: Relaxed  Interpretation (Fetal Testing) Nonstress Test Interpretation: Reactive Overall Impression: Reassuring for gestational age Comments: tracing reviewed by Dr. FAnnamaria Boots

## 2022-02-04 ENCOUNTER — Ambulatory Visit (HOSPITAL_BASED_OUTPATIENT_CLINIC_OR_DEPARTMENT_OTHER): Payer: BC Managed Care – PPO | Admitting: *Deleted

## 2022-02-04 ENCOUNTER — Ambulatory Visit: Payer: BC Managed Care – PPO | Admitting: *Deleted

## 2022-02-04 ENCOUNTER — Ambulatory Visit: Payer: BC Managed Care – PPO | Attending: Obstetrics

## 2022-02-04 VITALS — BP 120/71 | HR 98

## 2022-02-04 DIAGNOSIS — O24313 Unspecified pre-existing diabetes mellitus in pregnancy, third trimester: Secondary | ICD-10-CM | POA: Diagnosis not present

## 2022-02-04 DIAGNOSIS — O99283 Endocrine, nutritional and metabolic diseases complicating pregnancy, third trimester: Secondary | ICD-10-CM | POA: Diagnosis not present

## 2022-02-04 DIAGNOSIS — O365921 Maternal care for other known or suspected poor fetal growth, second trimester, fetus 1: Secondary | ICD-10-CM | POA: Diagnosis present

## 2022-02-04 DIAGNOSIS — Q282 Arteriovenous malformation of cerebral vessels: Secondary | ICD-10-CM | POA: Diagnosis present

## 2022-02-04 DIAGNOSIS — O10912 Unspecified pre-existing hypertension complicating pregnancy, second trimester: Secondary | ICD-10-CM | POA: Insufficient documentation

## 2022-02-04 DIAGNOSIS — O09293 Supervision of pregnancy with other poor reproductive or obstetric history, third trimester: Secondary | ICD-10-CM

## 2022-02-04 DIAGNOSIS — O10913 Unspecified pre-existing hypertension complicating pregnancy, third trimester: Secondary | ICD-10-CM

## 2022-02-04 DIAGNOSIS — O10013 Pre-existing essential hypertension complicating pregnancy, third trimester: Secondary | ICD-10-CM | POA: Diagnosis not present

## 2022-02-04 DIAGNOSIS — O365930 Maternal care for other known or suspected poor fetal growth, third trimester, not applicable or unspecified: Secondary | ICD-10-CM | POA: Diagnosis not present

## 2022-02-04 DIAGNOSIS — O99613 Diseases of the digestive system complicating pregnancy, third trimester: Secondary | ICD-10-CM

## 2022-02-04 DIAGNOSIS — Z3A30 30 weeks gestation of pregnancy: Secondary | ICD-10-CM | POA: Insufficient documentation

## 2022-02-04 DIAGNOSIS — O365931 Maternal care for other known or suspected poor fetal growth, third trimester, fetus 1: Secondary | ICD-10-CM | POA: Diagnosis not present

## 2022-02-04 DIAGNOSIS — K900 Celiac disease: Secondary | ICD-10-CM

## 2022-02-04 DIAGNOSIS — Z3A33 33 weeks gestation of pregnancy: Secondary | ICD-10-CM

## 2022-02-04 DIAGNOSIS — E0500 Thyrotoxicosis with diffuse goiter without thyrotoxic crisis or storm: Secondary | ICD-10-CM

## 2022-02-04 NOTE — Procedures (Signed)
Jessica Kelley 1990-09-15 [redacted]w[redacted]d Fetus A Non-Stress Test Interpretation for 02/04/22  Indication: IUGR  Fetal Heart Rate A Mode: External Baseline Rate (A): 145 bpm Variability: Moderate Accelerations: 10 x 10 Decelerations: None Multiple birth?: No  Uterine Activity Mode: Palpation, Toco Contraction Frequency (min): None  Interpretation (Fetal Testing) Nonstress Test Interpretation: Reactive Overall Impression: Reassuring for gestational age Comments: Dr. FAnnamaria Bootsreviewed tracing.

## 2022-02-12 ENCOUNTER — Ambulatory Visit: Payer: BC Managed Care – PPO | Admitting: *Deleted

## 2022-02-12 ENCOUNTER — Other Ambulatory Visit: Payer: Self-pay | Admitting: Maternal & Fetal Medicine

## 2022-02-12 ENCOUNTER — Other Ambulatory Visit: Payer: Self-pay | Admitting: *Deleted

## 2022-02-12 ENCOUNTER — Ambulatory Visit: Payer: BC Managed Care – PPO | Attending: Maternal & Fetal Medicine

## 2022-02-12 VITALS — BP 130/80 | HR 106

## 2022-02-12 DIAGNOSIS — O365930 Maternal care for other known or suspected poor fetal growth, third trimester, not applicable or unspecified: Secondary | ICD-10-CM | POA: Diagnosis not present

## 2022-02-12 DIAGNOSIS — O99283 Endocrine, nutritional and metabolic diseases complicating pregnancy, third trimester: Secondary | ICD-10-CM

## 2022-02-12 DIAGNOSIS — E0500 Thyrotoxicosis with diffuse goiter without thyrotoxic crisis or storm: Secondary | ICD-10-CM

## 2022-02-12 DIAGNOSIS — O10919 Unspecified pre-existing hypertension complicating pregnancy, unspecified trimester: Secondary | ICD-10-CM

## 2022-02-12 DIAGNOSIS — O365990 Maternal care for other known or suspected poor fetal growth, unspecified trimester, not applicable or unspecified: Secondary | ICD-10-CM

## 2022-02-12 DIAGNOSIS — O10013 Pre-existing essential hypertension complicating pregnancy, third trimester: Secondary | ICD-10-CM

## 2022-02-12 DIAGNOSIS — O24313 Unspecified pre-existing diabetes mellitus in pregnancy, third trimester: Secondary | ICD-10-CM

## 2022-02-12 DIAGNOSIS — K900 Celiac disease: Secondary | ICD-10-CM

## 2022-02-12 DIAGNOSIS — Z3A32 32 weeks gestation of pregnancy: Secondary | ICD-10-CM

## 2022-02-12 DIAGNOSIS — O24013 Pre-existing diabetes mellitus, type 1, in pregnancy, third trimester: Secondary | ICD-10-CM

## 2022-02-12 DIAGNOSIS — O99613 Diseases of the digestive system complicating pregnancy, third trimester: Secondary | ICD-10-CM

## 2022-02-12 NOTE — Procedures (Signed)
Jessica Kelley [birth year] [redacted]w[redacted]d Fetus A Non-Stress Test Interpretation for 02/12/22  Indication: IUGR, Chronic Hypertenstion, and Gestational Diabetes medication controlled  Fetal Heart Rate A Mode: External Baseline Rate (A): 145 bpm Variability: Moderate Accelerations: 15 x 15 Decelerations: None Multiple birth?: No  Uterine Activity Mode: Palpation, Toco Contraction Frequency (min): Occas UI Contraction Quality: Mild Resting Tone Palpated: Relaxed Resting Time: Adequate  Interpretation (Fetal Testing) Nonstress Test Interpretation: Reactive Comments: Dr. SDonalee Citrinreviewed tracing.

## 2022-02-12 NOTE — Progress Notes (Signed)
Pt sees birds in the sky when there are none seen by the people she is with.  She ate pizza, had steroid through the IV that day, did not put in insulin into pump, felt poorly, CBG 76 with a down arrow, drank an entire can mountain dew, fell asleep, woke up 6 hrs later, with CBG 134.

## 2022-02-19 ENCOUNTER — Encounter: Payer: Self-pay | Admitting: *Deleted

## 2022-02-19 ENCOUNTER — Ambulatory Visit (HOSPITAL_BASED_OUTPATIENT_CLINIC_OR_DEPARTMENT_OTHER): Payer: BC Managed Care – PPO | Admitting: Maternal & Fetal Medicine

## 2022-02-19 ENCOUNTER — Ambulatory Visit (HOSPITAL_BASED_OUTPATIENT_CLINIC_OR_DEPARTMENT_OTHER): Payer: BC Managed Care – PPO

## 2022-02-19 ENCOUNTER — Ambulatory Visit: Payer: BC Managed Care – PPO | Admitting: *Deleted

## 2022-02-19 ENCOUNTER — Inpatient Hospital Stay (HOSPITAL_COMMUNITY)
Admission: AD | Admit: 2022-02-19 | Discharge: 2022-02-19 | Disposition: A | Discharge status: Home / Self Care | Payer: BC Managed Care – PPO | Attending: Obstetrics and Gynecology | Admitting: Obstetrics and Gynecology

## 2022-02-19 ENCOUNTER — Encounter (HOSPITAL_COMMUNITY): Payer: Self-pay | Admitting: Obstetrics and Gynecology

## 2022-02-19 DIAGNOSIS — Q282 Arteriovenous malformation of cerebral vessels: Secondary | ICD-10-CM

## 2022-02-19 DIAGNOSIS — O288 Other abnormal findings on antenatal screening of mother: Secondary | ICD-10-CM

## 2022-02-19 DIAGNOSIS — O10913 Unspecified pre-existing hypertension complicating pregnancy, third trimester: Secondary | ICD-10-CM

## 2022-02-19 DIAGNOSIS — O24013 Pre-existing diabetes mellitus, type 1, in pregnancy, third trimester: Secondary | ICD-10-CM

## 2022-02-19 DIAGNOSIS — Z3689 Encounter for other specified antenatal screening: Secondary | ICD-10-CM | POA: Diagnosis not present

## 2022-02-19 DIAGNOSIS — O10013 Pre-existing essential hypertension complicating pregnancy, third trimester: Secondary | ICD-10-CM | POA: Diagnosis not present

## 2022-02-19 DIAGNOSIS — O365930 Maternal care for other known or suspected poor fetal growth, third trimester, not applicable or unspecified: Secondary | ICD-10-CM | POA: Insufficient documentation

## 2022-02-19 DIAGNOSIS — Z3A33 33 weeks gestation of pregnancy: Secondary | ICD-10-CM | POA: Insufficient documentation

## 2022-02-19 DIAGNOSIS — Z362 Encounter for other antenatal screening follow-up: Secondary | ICD-10-CM | POA: Diagnosis not present

## 2022-02-19 DIAGNOSIS — E108 Type 1 diabetes mellitus with unspecified complications: Secondary | ICD-10-CM | POA: Diagnosis not present

## 2022-02-19 DIAGNOSIS — O368131 Decreased fetal movements, third trimester, fetus 1: Secondary | ICD-10-CM | POA: Diagnosis present

## 2022-02-19 DIAGNOSIS — O365931 Maternal care for other known or suspected poor fetal growth, third trimester, fetus 1: Secondary | ICD-10-CM

## 2022-02-19 DIAGNOSIS — O24011 Pre-existing diabetes mellitus, type 1, in pregnancy, first trimester: Secondary | ICD-10-CM

## 2022-02-19 DIAGNOSIS — Z3A32 32 weeks gestation of pregnancy: Secondary | ICD-10-CM | POA: Insufficient documentation

## 2022-02-19 DIAGNOSIS — O365990 Maternal care for other known or suspected poor fetal growth, unspecified trimester, not applicable or unspecified: Secondary | ICD-10-CM | POA: Insufficient documentation

## 2022-02-19 LAB — PROTEIN / CREATININE RATIO, URINE
Creatinine, Urine: 19 mg/dL
Total Protein, Urine: <6 mg/dL

## 2022-02-19 LAB — COMPREHENSIVE METABOLIC PANEL
ALT: 26 U/L (ref 0–44)
AST: 38 U/L (ref 15–41)
Albumin: 2 g/dL — ABNORMAL LOW (ref 3.5–5.0)
Alkaline Phosphatase: 102 U/L (ref 38–126)
Anion gap: 8 (ref 5–15)
BUN: 11 mg/dL (ref 6–20)
CO2: 21 mmol/L — ABNORMAL LOW (ref 22–32)
Calcium: 8.6 mg/dL — ABNORMAL LOW (ref 8.9–10.3)
Chloride: 105 mmol/L (ref 98–111)
Creatinine, Ser: 0.66 mg/dL (ref 0.44–1.00)
GFR, Estimated: >60 mL/min (ref >60)
Glucose, Bld: 193 mg/dL — ABNORMAL HIGH (ref 70–99)
Potassium: 3.3 mmol/L — ABNORMAL LOW (ref 3.5–5.1)
Sodium: 134 mmol/L — ABNORMAL LOW (ref 135–145)
Total Bilirubin: 0.3 mg/dL (ref 0.3–1.2)
Total Protein: 7.6 g/dL (ref 6.5–8.1)

## 2022-02-19 LAB — CBC
HCT: 29.1 % — ABNORMAL LOW (ref 36.0–46.0)
Hemoglobin: 9.8 g/dL — ABNORMAL LOW (ref 12.0–15.0)
MCH: 31.1 pg (ref 26.0–34.0)
MCHC: 33.7 g/dL (ref 30.0–36.0)
MCV: 92.4 fL (ref 80.0–100.0)
Platelets: 121 10*3/uL — ABNORMAL LOW (ref 150–400)
RBC: 3.15 MIL/uL — ABNORMAL LOW (ref 3.87–5.11)
RDW: 16.4 % — ABNORMAL HIGH (ref 11.5–15.5)
WBC: 6.9 10*3/uL (ref 4.0–10.5)
nRBC: 0 % (ref 0.0–0.2)

## 2022-02-19 NOTE — MAU Note (Signed)
...  Jessica Kelley is a 31 y.o. at 77w6dhere in MAU reporting: Sent from the office for fetal monitoring due to non-reactive NST. BPP today 8/8. She reports she has been experiencing DFM for over one week now. Denies VB or LOF.   Fetal movement clicker given.  TT3SK Has continuous glucose monitoring.  Pain score: Denies pain  FHT: 155 initial external

## 2022-02-19 NOTE — MAU Provider Note (Signed)
History     CSN: [id number]  Arrival date and time: 02/19/22 1609   Event Date/Time   First Provider Initiated Contact with Patient 02/19/22 1654      Chief Complaint  Patient presents with   Decreased Fetal Movement   HPI Patient Jessica Kelley is a 31 y.o. G2P0010  At [redacted]w[redacted]d here for NST after having a non-reactive tracing at MFM . She was sent here for prolonged monitoring. She has a pregnancy complicated by IUGR, elevated dopplers, CHTN on labetalol, DM with continuous glucose monitoring.   She states that she feels strong fetal movements. She denies VB, LOF, contractions. She reports that she she has a mild HA due to stress.   She usually takes her morning dose of labetalol between 6-10 in the morning and then her evening does between 9-10. She took her morning does this morning.   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 Fransico Michael   Glaucoma    Hyperthyroidism    Macular degeneration    Pilonidal cyst    Scoliosis    Wyburn-Mason syndrome     Past Surgical History:  Procedure Laterality Date   CATARACT EXTRACTION  06/25/06   FOOT ARTHROPLASTY     left foot moved and stretched tendons    GLAUCOMA SURGERY  02/12/06   HIP ARTHROPLASTY     right never bipopsy   PORTACATH PLACEMENT  2002-2004   removal of vascular port     Sciatic Nerve Muscle Biopsy      Family History  Problem Relation Age of Onset   Thyroid disease Mother    Diabetes Sister    Other Sister        thyroid issues   Heart disease Maternal Grandmother    Hypertension Maternal Grandfather    Prostate cancer Maternal Grandfather    Cancer Maternal Grandfather         prostate   Cancer Paternal Grandmother        Died at 55 - kidney   Heart disease Paternal Grandfather    Coronary artery disease Paternal Grandfather    Hyperlipidemia Other    Asthma Neg Hx    Birth defects Neg Hx    Kidney disease Neg Hx    Stroke Neg Hx     Social History   Tobacco Use   Smoking status: Never   Smokeless tobacco: Never   Tobacco comments:    passive smoke exposure  Vaping Use   Vaping Use: Never used  Substance Use Topics   Alcohol use: Not Currently    Comment: not while preg   Drug use: No    Allergies:  Allergies  Allergen Reactions   Gluten Meal    Wheat Bran    Ginger Rash    Medications Prior to Admission  Medication Sig Dispense Refill Last Dose  labetalol (NORMODYNE) 200 MG tablet Take 200 mg by mouth 2 (two) times daily.   02/19/2022 at 1100   Continuous Blood Gluc Sensor (DEXCOM G6 SENSOR) MISC APPLY 1 SENSOR INTO THE SKIN AS DIRECTED CHANGE SENSORS EVERY 10 DAYS 3 each 6    Continuous Blood Gluc Transmit (DEXCOM G6 TRANSMITTER) MISC Use with sensor, Change ever 90 days 1 each 3    Glucagon (BAQSIMI TWO PACK) 3 MG/DOSE POWD Place 1 spray into the nose as directed. 2 each 3    immune globulin, human, (GAMMAGARD S/D LESS IGA) 10 g injection Inject into the vein.      insulin lispro (HUMALOG KWIKPEN) 100 UNIT/ML KwikPen Inject up to 50 units daily per provider guidance. Keep on file in case insulin pump breaks. 15 mL 3    insulin lispro (HUMALOG) 100 UNIT/ML injection Inject up to 300 units into insulin pump every 2 days. Fill for VIAL. Please fill for 90 day supply. 150 mL 3    methylPREDNISolone sodium succinate (SOLU-MEDROL) 1000 MG injection Inject 500 mg into the vein once. Once a week      NOVOLOG FLEXPEN 100 UNIT/ML FlexPen Inject up to 50 units daily 45 mL 4    NOVOLOG RELION 100 UNIT/ML injection INJECT UP TO 300 UNITS VIA INSULIN PUMP EVERY 2 DAYS 50 mL 3    Prenatal Vit-Fe Fumarate-FA (PRENATAL MULTIVITAMIN) TABS tablet Take  1 tablet by mouth daily at 12 noon.       Review of Systems  Constitutional: Negative.   HENT: Negative.    Respiratory: Negative.    Cardiovascular: Negative.   Gastrointestinal: Negative.   Genitourinary: Negative.   Musculoskeletal: Negative.   Neurological: Negative.   Hematological: Negative.   Psychiatric/Behavioral: Negative.     Physical Exam   Blood pressure 131/83, pulse 97, temperature 98.6 F (37 C), temperature source Oral, resp. rate 15, height 5\' 5"  (1.651 m), weight 85.3 kg, last menstrual period 07/04/2021, SpO2 97 %, unknown if currently breastfeeding.  Physical Exam Constitutional:      Appearance: Normal appearance.  Musculoskeletal:        General: Normal range of motion.  Skin:    General: Skin is warm.     Capillary Refill: Capillary refill takes less than 2 seconds.  Neurological:     Mental Status: She is alert.  Psychiatric:        Mood and Affect: Mood normal.        Behavior: Behavior normal.   Patient Vitals for the past 24 hrs:  BP Temp Temp src Pulse Resp SpO2 Height Weight  02/19/22 1830 131/83 -- -- 97 -- 97 % -- --  02/19/22 1815 (!) 133/51 -- -- (!) 104 -- 97 % -- --  02/19/22 1800 132/80 -- -- 98 -- 97 % -- --  02/19/22 1745 130/79 -- -- 98 -- 97 % -- --  02/19/22 1730 (!) 119/59 -- -- (!) 101 -- 95 % -- --  02/19/22 1715 (!) 122/53 -- -- 100 -- 94 % -- --  02/19/22 1632 (!) 140/82 98.6 F (37 C) Oral 97 15 96 % 5\' 5"  (1.651 m) 85.3 kg    MAU Course  Procedures  MDM -patient had mildly elevated bp upon arrival in MAU, however, BP was overall normal while in MAU with no severe readings -NST: 150 bpm, mod var, present acel, no decels, no contractions Patient feels strong fetal movements while in MAU. She reports that she has a slight HA right now  due to stress, she denies any other symptoms of pre-e. She thinks she needs to eat a snack -pre-e labs are normal; she feels well and is eating in bed. She states that she has no pain at  this time; HA is resolved.  -she has a CGM and is managing her insulin Assessment and Plan   1. NST (non-stress test) reactive   2. [redacted] weeks gestation of pregnancy    -patient stable for discharge with return precautions, she feels well and desires discharge -patient verbalized understanding about etal movement precautions and instuctions on when to return to MAU Charlesetta Garibaldi Torrey Horseman 02/19/2022, 7:02 PM

## 2022-02-19 NOTE — Procedures (Signed)
Jessica Kelley 11-13-1990 [redacted]w[redacted]d Fetus A Non-Stress Test Interpretation for 02/19/22  Indication: IUGR, Chronic Hypertenstion, and Diabetes  Fetal Heart Rate A Mode: External Baseline Rate (A): 160 bpm Variability: Moderate Accelerations: None Decelerations: Variable Multiple birth?: No  Uterine Activity Mode: Toco Contraction Frequency (min): none Resting Tone Palpated: Relaxed  Interpretation (Fetal Testing) Nonstress Test Interpretation: Non-reactive Overall Impression: Non-reassuring Comments: tracing reviewed by Dr. BGertie Exon

## 2022-02-19 NOTE — Progress Notes (Signed)
MFM Brief Note  Ms. Varon is a G2P0 who is here for follow up growth and UA dopplers given prior studies with abnormal UA Doppler and FGR.  She is seen at the request of Roma Schanz, DO  She is doing well today but notes decreased fetal movement   Normal interval growth with measurements consistent with dates and AGA. Good fetal movement and amniotic fluid volume  The UA Dopplers again are elevated with about evidence of AEDF or REDF.  BPP 8/10 for a non-reactive tracing.  I discussed today's visit with Ms Dazey and recommend she go to the MAU for further monitoring. I discussed the plan of care with Julianne Handler, CNM  I spent 20 minutes with > 50% in face to face consultation.  Vikki Ports, MD  I discussed the plan of care as well with Dr. Gaetano Net who will admit her if her tracing remains non-reactive and administer BMZ

## 2022-02-20 ENCOUNTER — Ambulatory Visit: Payer: BC Managed Care – PPO

## 2022-02-25 ENCOUNTER — Encounter: Payer: Self-pay | Admitting: *Deleted

## 2022-02-25 ENCOUNTER — Inpatient Hospital Stay (HOSPITAL_COMMUNITY)
Admission: RE | Admit: 2022-02-25 | Discharge: 2022-03-05 | DRG: 786 | Disposition: A | Discharge status: Home / Self Care | Payer: BC Managed Care – PPO | Attending: Obstetrics and Gynecology | Admitting: Obstetrics and Gynecology

## 2022-02-25 ENCOUNTER — Encounter (HOSPITAL_COMMUNITY): Payer: Self-pay | Admitting: Obstetrics and Gynecology

## 2022-02-25 ENCOUNTER — Ambulatory Visit (HOSPITAL_BASED_OUTPATIENT_CLINIC_OR_DEPARTMENT_OTHER): Payer: BC Managed Care – PPO | Admitting: Obstetrics and Gynecology

## 2022-02-25 ENCOUNTER — Ambulatory Visit: Payer: BC Managed Care – PPO | Admitting: *Deleted

## 2022-02-25 ENCOUNTER — Other Ambulatory Visit: Payer: Self-pay

## 2022-02-25 ENCOUNTER — Ambulatory Visit (HOSPITAL_BASED_OUTPATIENT_CLINIC_OR_DEPARTMENT_OTHER): Payer: BC Managed Care – PPO

## 2022-02-25 ENCOUNTER — Other Ambulatory Visit: Payer: Self-pay | Admitting: Obstetrics and Gynecology

## 2022-02-25 DIAGNOSIS — O365930 Maternal care for other known or suspected poor fetal growth, third trimester, not applicable or unspecified: Secondary | ICD-10-CM

## 2022-02-25 DIAGNOSIS — O24013 Pre-existing diabetes mellitus, type 1, in pregnancy, third trimester: Secondary | ICD-10-CM

## 2022-02-25 DIAGNOSIS — O9962 Diseases of the digestive system complicating childbirth: Secondary | ICD-10-CM | POA: Diagnosis present

## 2022-02-25 DIAGNOSIS — O365931 Maternal care for other known or suspected poor fetal growth, third trimester, fetus 1: Secondary | ICD-10-CM | POA: Diagnosis not present

## 2022-02-25 DIAGNOSIS — O10013 Pre-existing essential hypertension complicating pregnancy, third trimester: Secondary | ICD-10-CM

## 2022-02-25 DIAGNOSIS — Z9641 Presence of insulin pump (external) (internal): Secondary | ICD-10-CM | POA: Diagnosis present

## 2022-02-25 DIAGNOSIS — G610 Guillain-Barre syndrome: Secondary | ICD-10-CM | POA: Diagnosis present

## 2022-02-25 DIAGNOSIS — O10913 Unspecified pre-existing hypertension complicating pregnancy, third trimester: Secondary | ICD-10-CM | POA: Insufficient documentation

## 2022-02-25 DIAGNOSIS — M419 Scoliosis, unspecified: Secondary | ICD-10-CM | POA: Diagnosis present

## 2022-02-25 DIAGNOSIS — O99283 Endocrine, nutritional and metabolic diseases complicating pregnancy, third trimester: Secondary | ICD-10-CM | POA: Diagnosis not present

## 2022-02-25 DIAGNOSIS — O24313 Unspecified pre-existing diabetes mellitus in pregnancy, third trimester: Secondary | ICD-10-CM | POA: Insufficient documentation

## 2022-02-25 DIAGNOSIS — Z794 Long term (current) use of insulin: Secondary | ICD-10-CM

## 2022-02-25 DIAGNOSIS — O1092 Unspecified pre-existing hypertension complicating childbirth: Secondary | ICD-10-CM | POA: Diagnosis not present

## 2022-02-25 DIAGNOSIS — O10919 Unspecified pre-existing hypertension complicating pregnancy, unspecified trimester: Secondary | ICD-10-CM | POA: Insufficient documentation

## 2022-02-25 DIAGNOSIS — O1002 Pre-existing essential hypertension complicating childbirth: Secondary | ICD-10-CM | POA: Diagnosis present

## 2022-02-25 DIAGNOSIS — K900 Celiac disease: Secondary | ICD-10-CM | POA: Diagnosis present

## 2022-02-25 DIAGNOSIS — Q282 Arteriovenous malformation of cerebral vessels: Secondary | ICD-10-CM

## 2022-02-25 DIAGNOSIS — O99354 Diseases of the nervous system complicating childbirth: Secondary | ICD-10-CM | POA: Diagnosis present

## 2022-02-25 DIAGNOSIS — O365990 Maternal care for other known or suspected poor fetal growth, unspecified trimester, not applicable or unspecified: Secondary | ICD-10-CM

## 2022-02-25 DIAGNOSIS — O99892 Other specified diseases and conditions complicating childbirth: Secondary | ICD-10-CM | POA: Diagnosis present

## 2022-02-25 DIAGNOSIS — E039 Hypothyroidism, unspecified: Secondary | ICD-10-CM | POA: Diagnosis present

## 2022-02-25 DIAGNOSIS — D62 Acute posthemorrhagic anemia: Secondary | ICD-10-CM | POA: Diagnosis not present

## 2022-02-25 DIAGNOSIS — O9081 Anemia of the puerperium: Secondary | ICD-10-CM | POA: Diagnosis not present

## 2022-02-25 DIAGNOSIS — O09299 Supervision of pregnancy with other poor reproductive or obstetric history, unspecified trimester: Secondary | ICD-10-CM | POA: Diagnosis present

## 2022-02-25 DIAGNOSIS — Z993 Dependence on wheelchair: Secondary | ICD-10-CM

## 2022-02-25 DIAGNOSIS — Z3A33 33 weeks gestation of pregnancy: Secondary | ICD-10-CM | POA: Insufficient documentation

## 2022-02-25 DIAGNOSIS — O2402 Pre-existing diabetes mellitus, type 1, in childbirth: Principal | ICD-10-CM | POA: Diagnosis present

## 2022-02-25 DIAGNOSIS — O99284 Endocrine, nutritional and metabolic diseases complicating childbirth: Secondary | ICD-10-CM | POA: Diagnosis present

## 2022-02-25 DIAGNOSIS — E0500 Thyrotoxicosis with diffuse goiter without thyrotoxic crisis or storm: Secondary | ICD-10-CM

## 2022-02-25 DIAGNOSIS — E109 Type 1 diabetes mellitus without complications: Secondary | ICD-10-CM | POA: Diagnosis present

## 2022-02-25 LAB — CBC
HCT: 32.8 % — ABNORMAL LOW (ref 36.0–46.0)
Hemoglobin: 11.3 g/dL — ABNORMAL LOW (ref 12.0–15.0)
MCH: 31 pg (ref 26.0–34.0)
MCHC: 34.5 g/dL (ref 30.0–36.0)
MCV: 90.1 fL (ref 80.0–100.0)
Platelets: 108 K/uL — ABNORMAL LOW (ref 150–400)
RBC: 3.64 MIL/uL — ABNORMAL LOW (ref 3.87–5.11)
RDW: 15.9 % — ABNORMAL HIGH (ref 11.5–15.5)
WBC: 6.4 K/uL (ref 4.0–10.5)
nRBC: 0 % (ref 0.0–0.2)

## 2022-02-25 LAB — COMPREHENSIVE METABOLIC PANEL
ALT: 25 U/L (ref 0–44)
AST: 41 U/L (ref 15–41)
Albumin: 2.3 g/dL — ABNORMAL LOW (ref 3.5–5.0)
Alkaline Phosphatase: 112 U/L (ref 38–126)
Anion gap: 11 (ref 5–15)
BUN: 10 mg/dL (ref 6–20)
CO2: 20 mmol/L — ABNORMAL LOW (ref 22–32)
Calcium: 9 mg/dL (ref 8.9–10.3)
Chloride: 101 mmol/L (ref 98–111)
Creatinine, Ser: 0.5 mg/dL (ref 0.44–1.00)
GFR, Estimated: >60 mL/min (ref >60)
Glucose, Bld: 155 mg/dL — ABNORMAL HIGH (ref 70–99)
Potassium: 3.8 mmol/L (ref 3.5–5.1)
Sodium: 132 mmol/L — ABNORMAL LOW (ref 135–145)
Total Bilirubin: 0.4 mg/dL (ref 0.3–1.2)
Total Protein: 7.6 g/dL (ref 6.5–8.1)

## 2022-02-25 LAB — TYPE AND SCREEN
ABO/RH(D): O POS
Antibody Screen: NEGATIVE

## 2022-02-25 LAB — TSH: TSH: 1.138 u[IU]/mL (ref 0.350–4.500)

## 2022-02-25 LAB — T4, FREE: Free T4: 0.82 ng/dL (ref 0.61–1.12)

## 2022-02-25 MED ORDER — ASPIRIN 81 MG PO CHEW
81.0000 mg | CHEWABLE_TABLET | Freq: Every day | ORAL | Status: DC
  Administered 2022-02-25: 81 mg via ORAL
  Filled 2022-02-25: qty 1

## 2022-02-25 MED ORDER — LABETALOL HCL 200 MG PO TABS
200.0000 mg | ORAL_TABLET | Freq: Two times a day (BID) | ORAL | Status: DC
  Administered 2022-02-25 – 2022-02-27 (×4): 200 mg via ORAL
  Filled 2022-02-25 (×4): qty 1

## 2022-02-25 MED ORDER — ZOLPIDEM TARTRATE 5 MG PO TABS
5.0000 mg | ORAL_TABLET | Freq: Every evening | ORAL | Status: DC | PRN
  Administered 2022-02-26 (×2): 5 mg via ORAL
  Filled 2022-02-25 (×2): qty 1

## 2022-02-25 MED ORDER — PRENATAL MULTIVITAMIN CH
1.0000 | ORAL_TABLET | Freq: Every day | ORAL | Status: DC
  Administered 2022-02-26: 1 via ORAL
  Filled 2022-02-25 (×2): qty 1

## 2022-02-25 MED ORDER — SODIUM CHLORIDE 0.9% FLUSH: 3.0000 mL | INTRAVENOUS | Status: DC | PRN

## 2022-02-25 MED ORDER — CALCIUM CARBONATE ANTACID 500 MG PO CHEW: 2.0000 | CHEWABLE_TABLET | ORAL | Status: DC | PRN

## 2022-02-25 MED ORDER — ACETAMINOPHEN 325 MG PO TABS: 650.0000 mg | ORAL_TABLET | ORAL | Status: DC | PRN

## 2022-02-25 MED ORDER — SODIUM CHLORIDE 0.9 % IV SOLN
400.0000 mg | Freq: Once | INTRAVENOUS | Status: AC
  Administered 2022-02-25: 400 mg via INTRAVENOUS
  Filled 2022-02-25: qty 3.2

## 2022-02-25 MED ORDER — SODIUM CHLORIDE 0.9% FLUSH
3.0000 mL | Freq: Two times a day (BID) | INTRAVENOUS | Status: DC
  Administered 2022-02-25 – 2022-03-04 (×12): 3 mL via INTRAVENOUS

## 2022-02-25 MED ORDER — HEPARIN SODIUM (PORCINE) 10000 UNIT/ML IJ SOLN
10000.0000 [IU] | Freq: Two times a day (BID) | INTRAMUSCULAR | Status: DC
  Administered 2022-02-25 – 2022-02-26 (×2): 10000 [IU] via SUBCUTANEOUS
  Filled 2022-02-25 (×3): qty 1

## 2022-02-25 MED ORDER — INSULIN PUMP
Freq: Three times a day (TID) | SUBCUTANEOUS | Status: DC
  Administered 2022-02-26 (×2): 22.5 via SUBCUTANEOUS
  Filled 2022-02-25: qty 1

## 2022-02-25 MED ORDER — SODIUM CHLORIDE 0.9 % IV SOLN: 250.0000 mL | INTRAVENOUS | Status: DC | PRN

## 2022-02-25 MED ORDER — DOCUSATE SODIUM 100 MG PO CAPS
100.0000 mg | ORAL_CAPSULE | Freq: Every day | ORAL | Status: DC
  Administered 2022-02-28: 100 mg via ORAL
  Filled 2022-02-25 (×4): qty 1

## 2022-02-25 MED ORDER — BETAMETHASONE SOD PHOS & ACET 6 (3-3) MG/ML IJ SUSP
12.0000 mg | INTRAMUSCULAR | Status: AC
  Administered 2022-02-25 – 2022-02-26 (×2): 12 mg via INTRAMUSCULAR
  Filled 2022-02-25: qty 5

## 2022-02-25 MED ORDER — IMMUNE GLOBULIN (HUMAN) 10 GM/100ML IV SOLN
50.0000 g | Freq: Once | INTRAVENOUS | Status: AC
  Administered 2022-02-26: 50 g via INTRAVENOUS
  Filled 2022-02-25: qty 500

## 2022-02-25 NOTE — Inpatient Diabetes Management (Addendum)
Inpatient Diabetes Program Recommendations  Diabetes Treatment Program Recommendations  ADA Standards of Care Diabetes in Pregnancy Target Glucose Ranges:  Fasting: 70 - 95 mg/dL 1 hr postprandial: Less than 164m/dL (from first bite of meal) 2 hr postprandial: Less than 120 mg/dL (from first bite of meal)      Latest Reference Range & Units 02/25/22 11:56  Glucose 70 - 99 mg/dL 155 (H)    Latest Reference Range & Units 08/28/21 11:35  Hemoglobin A1C <5.7 % of total Hgb 8.0 (H)   Review of Glycemic Control  Diabetes history: DM1; [redacted]W[redacted]D Outpatient Diabetes medications: Insulin Pump with Humalog, Dexcom CGM Current orders for Inpatient glycemic control: None  Inpatient Diabetes Program Recommendations:    Insulin Pump: Please consider using insulin Pump order set to order CBGs and insulin pump fasting and 2 hour post prandial and 2am.   NOTE: Received consult for diabetes coordinator for "Type I DM, betamethasone ordered, please assist in insulin management." Per chart review, patient uses an insulin pump for DM control, is [redacted]W[redacted]D gestation, and has already received first dose of Betamethasone at 12:30 today. Noted patient sees Dr. BTobe Sos(Pediatric Endocrinologist) and was last seen on 08/28/21. Will plan to talk with patient to get insulin pump settings today.  Addendum 02/25/22@14 :35-Spoke with patient at bedside regarding DM control. Patient confirms that she has DM1 and she uses T-Slim insulin pump with Humalog and Dexcom CGM. Patient reports that she gets Solumedrol every week on Monday so she is use to chasing her glucose.  Patient reports that her glucose goes up to 200's after she gets Solumedrol. Patient has 2 basal patterns in her pump (normal basal and steroid basal). Patient does use Control IQ mode on her pump and has been working with MDrexel Ihaat Dr. BLoren Raceroffice for pump changes. Patient notes that Dr. BTobe Sosretired and she is set up to see one of the other  providers in his office for follow up. Patient reports she has extra pump supplies at bedside but she notes she will need to change Dexcom CGM in the next few days so she will ask her mother to bring new Dexcom supplies to the hospital. Reviewed insulin pump settings:   Normal Profile Basal insulin 12A 1.3 units/hour 3A 1.6 units/hour 8A 1.5 units/hour 12P 1.5 units/hour 6P 1.5 units/hour  Total daily basal insulin: 35.9 units/24 hours  Carb Coverage 12A     1:5 1 unit for every 5 grams of carbohydrates 3A       1:5        1 unit for every 5 grams of carbohydrates 8A       1:4.5 1 unit for every 4.5 grams of carbohydrates 12P     1:5.5 1 unit for every 5.5 grams of carbohydrates 6P       1:6 1 unit for every 6 grams of carbohydrates  Insulin Sensitivity 12A 1:35 1 unit drops blood glucose 35 mg/dl 3A 1:35 1 unit drops blood glucose 35 mg/dl 8A 1:30 1 unit drops blood glucose 30 mg/dl 12P 1:30 1 unit drops blood glucose 30 mg/dl 6P 1:30 1 unit drops blood glucose 30 mg/dl  Target Glucose Goals 12A 110 mg/dl  Steroid Profile Basal Insulin 12A 1.6 units/hour Total daily basal insulin: 38.4 units/24 hours  Carb Coverage 12A 1:2 grams 1 unit for every 2 grams of carbboydrates  Insulin Sensitivity 1:20 mg/dl  1 unit drops blood glucose 20 mg/dl  Target Glucose 120 mg/dl  Patient has been given  dose of Betamethasone today and is aware that it will likely increase glucose. Patient went ahead and switched pump settings to Steroid Profile during our discussion. Explained that she will get second dose of Betamethasone tomorrow. Patient states that she end up having a c-section in the new couple of days. Discussed that if glucose is consistently elevated, she may end up needing IV insulin. Patient is agreeable to use IV insulin if needed for glycemic control. Patient does not have any pump settings for following delivery. Discussed how insulin needs will change following delivery and  that her pump settings would need to be decreased. Informed patient that our team will follow up with her tomorrow.  Patient verbalized understanding of information discussed and states that she does not have any further questions related to diabetes at this time.  NURSING: Once insulin pump order set is ordered please print off the Patient insulin pump contract and flow sheet. The insulin pump contract should be signed by the patient and then placed in the chart. The patient insulin pump flow sheet will be completed by the patient at the bedside and the RN caring for the patient will use the patient's flow sheet to document in the [facility]. RN will need to complete the Nursing Insulin Pump Flowsheet at least once a shift. Patient will need to keep extra insulin pump supplies at the bedside at all times.    Thanks, Barnie Alderman, RN, MSN, [place] Diabetes Coordinator Inpatient Diabetes Program [phone] (Team Pager from 8am to [place])

## 2022-02-25 NOTE — Progress Notes (Signed)
Appreciate consultation with Dr Curly Shores D/W pharmacist>will give IVIG 50gm today                           >will give IV Solumedrol 426m (reduced dose)  D/W anesthesiology-they are aware.  Per Dr SJaquita Rectornote, if absent end-diastolic flow is seen tomorrow he recommends delivery at 34 weeks. C/S for delivery is planned due to LE weakness Will probably need stress dose of steroids perioperatively

## 2022-02-25 NOTE — Consult Note (Signed)
Neonatology Consult to Antenatal Patient:  I was asked by Dr. Gaetano Net to see this patient in order to provide antenatal counseling due to prematurity.  Jessica Kelley was admitted 02/25/22 at 85w5dGA due to intermittent AEDBF. She is currently not having active labor and membranes are intact. She is getting BMZ.  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 Mostly wheelchair bound. Can pull up to walker using arms at home. However, she has frequent falls and has not been tying to walk much 10) Fetal growth restriction-9% 01/07/22. Subsequent U/S notes improved growth but abnormal dopplers have persisted. Normal AFI.    I spoke with the patient and FOB. We discussed the worst case of delivery in the next 1-2 days, including usual DR management, possible respiratory complications and need for support, IV access, feedings (mother open to breast feeding if beneficial, which was encouraged at least for the first week; donor breast milk use also discussed to which they were open to), LOS, Mortality and Morbidity, and long term outcomes. She did have questions at this time which were answered. I/we would be glad to come back if she has more questions later.  Thank you for asking me to see this patient.  DavMonia SabalrKatherina MiresD Neonatologist  The total length of face-to-face or floor/unit time for this encounter was 25 minutes. Counseling and/or coordination of care was 40 minutes of the above.

## 2022-02-25 NOTE — Progress Notes (Signed)
Maternal-Fetal Medicine (Follow-up Consultation)  Name: Jessica Kelley DOB: 10/22/1990 MRN: [id number] Referring Provider: Everlene Farrier, MD  I had the pleasure of seeing Ms. Needles today at the [facility]. She is G1 P0 at 33w 6d gestation and has the following high-risk problems: -Fetal growth restriction.  On ultrasound performed on 01/07/2022, the estimated fetal weight was at the 9th percentile.  On subsequent ultrasound performed on 01/28/2022 and 02/19/2022, fetal growth restriction had resolved but abnormal Doppler studies had persisted. -Pregestational diabetes.  Patient is on insulin pump and is being followed by her endocrinologist.  She reports her blood glucose levels of within normal range. -Chronic hypertension.  Well-controlled on labetalol.  Blood pressure today at our office is 131/80 mmHg. -Hypothyroidism -Chronic inflammatory demyelinating polyradiculopathy (CIDP) patient is getting weekly steroids.  Ultrasound Amniotic fluid is normal and good fetal activity seen.  Cephalic presentation.  Umbilical artery Doppler showed increased S/D ratio with 2 waves of absent end-diastolic flow.  NST is not reactive.  Fetal growth restriction with abnormal Doppler studies -I explained the importance of intermittent absent end-diastolic flow that needs to be confirmed again by repeat Doppler study tomorrow.  I discussed the benefit of antenatal corticosteroids for fetal lung maturity.  Since the patient has diabetes, I recommend giving it a Methasone as an inpatient with twice daily NST monitoring and repeat UA Doppler study tomorrow. I discussed timing of delivery.  If absent end-diastolic flow is seen again, delivery at 34 weeks is recommended. I informed the patient that hyperglycemia can worsen with steroids and sliding scale insulin should control hyperglycemia. Some fetal intolerance is expected during labor.  Mode of delivery can be discussed with the  patient.  Recommendations -Admit to [facility] care (discussed with Dr. Gertie Fey). -Betamethasone today and tomorrow. -Twice daily NSTs. -BPP and UA Doppler study tomorrow and on 02/27/22. -If absent end-diastolic flow is seen again, I recommend delivery at [redacted] weeks gestation.   Thank you for consultation.  If you have any questions or concerns, please contact me the [facility].  Consultation including face-to-face (more than 50%) counseling 30 minutes.

## 2022-02-25 NOTE — H&P (Signed)
Jessica Kelley is a 31 y.o. female presenting for observation. 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 Mostly wheelchair bound. Can pull up to walker using arms at home. However, she has frequent falls and has not been tying to walk much 10) Fetal growth restriction-9% 01/07/22. Subsequent U/S notes improved growth but abnormal dopplers have persisted. Today Dr ShDonalee Citrinoted intermittent absent end diastolic flow . He recommends admission. OB History     Gravida  2   Para      Term      Preterm      AB  1   Living         SAB  1   IAB      Ectopic      Multiple      Live Births             Past Medical History:  Diagnosis [facility]   [facility]   Patient stated   Celiac disease    CIDP (chronic inflammatory demyelinating polyneuropathy) [place]   [place]   Dr. BrTobe Sos Glaucoma    Hyperthyroidism    Macular degeneration    Pilonidal cyst    Scoliosis    Wyburn-Mason syndrome    Past Surgical History:  Procedure Laterality Date   CATARACT EXTRACTION  06/25/06   FOOT ARTHROPLASTY     left foot moved and stretched tendons    GLAUCOMA SURGERY  02/12/06   HIP ARTHROPLASTY     right never bipopsy   PORTACATH PLACEMENT  2002-2004   removal of vascular port     Sciatic Nerve Muscle Biopsy     Family History: family history includes Cancer in her maternal grandfather and paternal grandmother; Coronary artery disease in her paternal grandfather; Diabetes in her sister; Heart  disease in her maternal grandmother and paternal grandfather; Hyperlipidemia in an other family member; Hypertension in her maternal grandfather; Other in her sister; Prostate cancer in her maternal grandfather; Thyroid disease in her mother. Social History:  reports that she has never smoked. She has never used smokeless tobacco. She reports that she does not currently use alcohol. She reports that she does not use drugs.     Maternal Diabetes: Yes:  Diabetes Type:  Pre-pregnancy Genetic Screening: Normal Maternal Ultrasounds/Referrals: IUGR Fetal Ultrasounds or other Referrals:  Fetal echo, Referred to Materal Fetal Medicine  Maternal Substance Abuse:  No Significant Maternal Medications:  Meds include: Other: insulin Significant Maternal Lab Results:  None Number of Prenatal Visits:greater than 3 verified prenatal visits Other Comments:  None  Review of Systems  Constitutional:  Negative for fever.  Eyes:  Negative for visual disturbance.  Gastrointestinal:  Negative for abdominal pain.  Neurological:  Negative for headaches.   Maternal Medical History:  Fetal activity: Perceived fetal activity is normal.       Blood pressure 137/88, pulse (!) 102, temperature 98.5 F (36.9 C), temperature source Oral, resp. rate 17, height 5' 5"  (1.651 m), weight 85.3 kg, last menstrual period 07/04/2021, SpO2 98 %, unknown if currently breastfeeding. Maternal Exam:  Abdomen:  Patient reports no abdominal tenderness.   Fetal Exam Fetal State Assessment: Category I - tracings are normal.   Physical Exam Cardiovascular:     Rate and Rhythm: Normal rate.  Pulmonary:     Effort: Pulmonary effort is normal.     Prenatal labs: ABO, Rh: --/--/O POS (10/09 1156) Antibody: NEG (10/09 1156) Rubella:   RPR:    HBsAg:    HIV:    GBS:     Assessment/Plan: 31 yo G2P0 @ 33 5/7 wks Pre pregnancy DM>continue insulin pump, diabetes coordinator consult HTN-continue labetalol 228m BID, labs  pending CIDP-neurology consultation  Scoliosis-anesthesia consult Fetal growth restriction with intermittent absent end-diastolic flow     NST BID     Dr SDonalee Citrinto reevaluate in am     BMZ-#1 done     Neonatology consult C/S for delivery due to weakness in LE Heparin 10,000 U q12 hours and SCD>continue heparin PP x 6 weeks Saline lock   JShon MilletII 02/25/2022, 2:00 PM

## 2022-02-25 NOTE — Plan of Care (Signed)
Discussed with Dr. Gaetano Net as a curbside.  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 After delivery of child may change to 70 gm once per week, per her request for convenience. - Continue Solumedrol 500 mg weekly"  She is due for IVIG today and this can be given at the normal dose today or tomorrow She has received Betamethasone to promote lung maturation of her fetus --I recommend consultation with pharmacy to see if this approximates her standard Solu-Medrol dose. In the absence of any acute neurological changes, she can continue to follow-up with her outpatient neurologist

## 2022-02-25 NOTE — Procedures (Signed)
Jessica Kelley [birth year] [redacted]w[redacted]d Fetus A Non-Stress Test Interpretation for 02/25/22  Indication: IUGR, Chronic Hypertenstion, Diabetes, and absent end diastolic flow  Fetal Heart Rate A Mode: External Baseline Rate (A): 150 bpm Variability: Minimal, Moderate Accelerations: 10 x 10 Decelerations: Variable Multiple birth?: No  Uterine Activity Mode: Toco Contraction Frequency (min): irreg Contraction Duration (sec): 35-80 Contraction Quality: Mild Resting Tone Palpated: Relaxed  Interpretation (Fetal Testing) Nonstress Test Interpretation: Non-reactive Overall Impression: Reassuring for gestational age Comments: tracing reviewed by Dr. SDonalee Citrin

## 2022-02-26 ENCOUNTER — Inpatient Hospital Stay (HOSPITAL_BASED_OUTPATIENT_CLINIC_OR_DEPARTMENT_OTHER): Payer: BC Managed Care – PPO

## 2022-02-26 DIAGNOSIS — E0500 Thyrotoxicosis with diffuse goiter without thyrotoxic crisis or storm: Secondary | ICD-10-CM

## 2022-02-26 DIAGNOSIS — O24313 Unspecified pre-existing diabetes mellitus in pregnancy, third trimester: Secondary | ICD-10-CM | POA: Diagnosis not present

## 2022-02-26 DIAGNOSIS — O365930 Maternal care for other known or suspected poor fetal growth, third trimester, not applicable or unspecified: Secondary | ICD-10-CM | POA: Diagnosis not present

## 2022-02-26 DIAGNOSIS — O10013 Pre-existing essential hypertension complicating pregnancy, third trimester: Secondary | ICD-10-CM

## 2022-02-26 DIAGNOSIS — O99283 Endocrine, nutritional and metabolic diseases complicating pregnancy, third trimester: Secondary | ICD-10-CM

## 2022-02-26 DIAGNOSIS — O99353 Diseases of the nervous system complicating pregnancy, third trimester: Secondary | ICD-10-CM

## 2022-02-26 DIAGNOSIS — Z3A34 34 weeks gestation of pregnancy: Secondary | ICD-10-CM

## 2022-02-26 DIAGNOSIS — G6181 Chronic inflammatory demyelinating polyneuritis: Secondary | ICD-10-CM

## 2022-02-26 LAB — GLUCOSE, CAPILLARY
Glucose-Capillary: 268 mg/dL — ABNORMAL HIGH (ref 70–99)
Glucose-Capillary: 225 mg/dL — ABNORMAL HIGH (ref 70–99)
Glucose-Capillary: 256 mg/dL — ABNORMAL HIGH (ref 70–99)
Glucose-Capillary: 196 mg/dL — ABNORMAL HIGH (ref 70–99)
Glucose-Capillary: 151 mg/dL — ABNORMAL HIGH (ref 70–99)
Glucose-Capillary: 156 mg/dL — ABNORMAL HIGH (ref 70–99)
Glucose-Capillary: 221 mg/dL — ABNORMAL HIGH (ref 70–99)
Glucose-Capillary: 214 mg/dL — ABNORMAL HIGH (ref 70–99)
Glucose-Capillary: 214 mg/dL — ABNORMAL HIGH (ref 70–99)
Glucose-Capillary: 172 mg/dL — ABNORMAL HIGH (ref 70–99)
Glucose-Capillary: 148 mg/dL — ABNORMAL HIGH (ref 70–99)
Glucose-Capillary: 124 mg/dL — ABNORMAL HIGH (ref 70–99)
Glucose-Capillary: 151 mg/dL — ABNORMAL HIGH (ref 70–99)
Glucose-Capillary: 140 mg/dL — ABNORMAL HIGH (ref 70–99)

## 2022-02-26 LAB — COMPREHENSIVE METABOLIC PANEL
ALT: 24 U/L (ref 0–44)
AST: 37 U/L (ref 15–41)
Albumin: 2.3 g/dL — ABNORMAL LOW (ref 3.5–5.0)
Alkaline Phosphatase: 123 U/L (ref 38–126)
Anion gap: 11 (ref 5–15)
BUN: 16 mg/dL (ref 6–20)
CO2: 20 mmol/L — ABNORMAL LOW (ref 22–32)
Calcium: 9.4 mg/dL (ref 8.9–10.3)
Chloride: 102 mmol/L (ref 98–111)
Creatinine, Ser: 0.65 mg/dL (ref 0.44–1.00)
GFR, Estimated: >60 mL/min (ref >60)
Glucose, Bld: 170 mg/dL — ABNORMAL HIGH (ref 70–99)
Potassium: 3.8 mmol/L (ref 3.5–5.1)
Sodium: 133 mmol/L — ABNORMAL LOW (ref 135–145)
Total Bilirubin: 0.7 mg/dL (ref 0.3–1.2)
Total Protein: 7.3 g/dL (ref 6.5–8.1)

## 2022-02-26 LAB — CBC
HCT: 33.5 % — ABNORMAL LOW (ref 36.0–46.0)
Hemoglobin: 11.4 g/dL — ABNORMAL LOW (ref 12.0–15.0)
MCH: 30.6 pg (ref 26.0–34.0)
MCHC: 34 g/dL (ref 30.0–36.0)
MCV: 90.1 fL (ref 80.0–100.0)
Platelets: 124 10*3/uL — ABNORMAL LOW (ref 150–400)
RBC: 3.72 MIL/uL — ABNORMAL LOW (ref 3.87–5.11)
RDW: 16.2 % — ABNORMAL HIGH (ref 11.5–15.5)
WBC: 9.5 10*3/uL (ref 4.0–10.5)
nRBC: 0 % (ref 0.0–0.2)

## 2022-02-26 MED ORDER — LACTATED RINGERS IV SOLN: INTRAVENOUS | Status: DC

## 2022-02-26 MED ORDER — SODIUM CHLORIDE 0.9 % IV SOLN: INTRAVENOUS | Status: DC

## 2022-02-26 MED ORDER — DEXTROSE 50 % IV SOLN: 0.0000 mL | INTRAVENOUS | Status: DC | PRN

## 2022-02-26 MED ORDER — INSULIN REGULAR(HUMAN) IN NACL 100-0.9 UT/100ML-% IV SOLN
INTRAVENOUS | Status: DC
  Administered 2022-02-26: 13 [IU]/h via INTRAVENOUS
  Administered 2022-02-26: 8 [IU]/h via INTRAVENOUS
  Filled 2022-02-26: qty 100

## 2022-02-26 MED ORDER — HEPARIN SODIUM (PORCINE) 10000 UNIT/ML IJ SOLN
10000.0000 [IU] | Freq: Two times a day (BID) | INTRAMUSCULAR | Status: DC
  Filled 2022-02-26: qty 1

## 2022-02-26 MED ORDER — HEPARIN SODIUM (PORCINE) 10000 UNIT/ML IJ SOLN: 10000.0000 [IU] | Freq: Two times a day (BID) | INTRAMUSCULAR | Status: DC

## 2022-02-26 MED ORDER — LACTATED RINGERS IV BOLUS
500.0000 mL | Freq: Once | INTRAVENOUS | Status: AC
  Administered 2022-02-26: 500 mL via INTRAVENOUS

## 2022-02-26 MED ORDER — DEXTROSE IN LACTATED RINGERS 5 % IV SOLN: INTRAVENOUS | Status: DC

## 2022-02-26 NOTE — Progress Notes (Signed)
Spoke with Dr. Donalee Citrin, MFM - dopplers with intermittent AEDF today. This was discussed with the patient and she is aware re recommendations for delivery at 34 weeks. Will plan for CS tomorrow. NPO pMN and will hold heparin. Will need stress dose steroids to be given tomorrow prior to CS. Will notify L&D charge.  Hurshel Party, MD

## 2022-02-26 NOTE — Progress Notes (Signed)
Delayed entry note  Previous dating in Epic incorrect for patient - she is actually 25w5dtoday. Now corrected in chart. Discussed this with Dr. SDonalee Citrin who still recommends delivery tomorrow especially in the setting of less reassuring fetal monitoring as [facility] recommends delivery 33-34 weeks for iAEDF. Will keep c-section as scheduled.  JHurshel Party MD

## 2022-02-26 NOTE — Inpatient Diabetes Management (Signed)
Inpatient Diabetes Program Recommendations  AACE/ADA: New Consensus Statement on Inpatient Glycemic Control (2015)  Target Ranges:  Prepandial:   less than 140 mg/dL      Peak postprandial:   less than 180 mg/dL (1-2 hours)      Critically ill patients:  140 - 180 mg/dL   Lab Results  Component Value Date   GLUCAP 85 01/30/2019   HGBA1C 8.0 (H) 08/28/2021    Review of Glycemic Control  Latest Reference Range & Units 02/26/22 04:34  Glucose 70 - 99 mg/dL 170 (H)  (H): Data is abnormally high Diabetes history: DM1; [redacted]W[redacted]D Outpatient Diabetes medications: Insulin Pump with Humalog, Dexcom CGM Current orders for Inpatient glycemic control: Insulin pump Solumedrol Qwk BMZ 12 mg 1(2)   Inpatient Diabetes Program Recommendations:   Consider adding IV insulin per Endotool for hyperglycemia now. Patient is current wearing insulin pump and Dexcom.  At the time of IV insulin initiation disconnect insulin pump infusion. RN at bedside and discussed.   Discussed plan with Dr Donalee Citrin and Dr Bridgett Larsson.   Spoke with patient at bedside. In agreement with plan and will further plan for SQ injections following delivery.   After delivery (in PACU) consider: -Semglee 28 units two hours prior to discontinuation of IV insulin, then QD to follow.  -Novolog 0-15 units TID & HS -Novolog 8 units TID (assuming patient is consuming >50% of meals)  No further questions at this time. Delivery scheduled for 10/11.   Thanks, Bronson Curb, MSN, RNC-OB Diabetes Coordinator [phone] (8a-5p)

## 2022-02-26 NOTE — Progress Notes (Signed)
Antepartum Progress Note  S: Feeling well this morning. +FM, no contractions.  O: Vitals:   02/26/22 0358 02/26/22 0811  BP: (!) 121/53 129/69  Pulse: (!) 111 (!) 109  Resp: 18 20  Temp: 98.2 F (36.8 C) 98.1 F (36.7 C)  SpO2: 96% 97%   NST reactive overnight Abd gravid, NT. No evidence of DVT  A/P: 30yo G2P0010 @ 33w6dadmitted for observation due to intermittent AEDF and FGR.  - FGR - dopplers with iAEDF, for repeat today. Per MFM, if persistent will plan for delivery at [facility] S/p NICU consultation and steroids. - Type 1 DM on CGM and insulin pump, fetal echo normal. Diabetes coordinator consulted yesterday. Appreciate assistance. 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 After delivery of child may change to 70 gm once per week, per her request for convenience. Continue Solumedrol 500 mg weekly. This is per neurology. Will be for stress dose steroids prior to delivery. Wheelchair bound, has weakness in both legs. - cHTN - on labetalol 2083mBID. Will hold LDA. - Hypothyrodism s/p Hyperthyroidism - previously on PTU, no meds currently. TSH on admission wnl. 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 - Anxiety - no meds, mood currently stable - Celiac disease  JaHurshel PartyMD

## 2022-02-26 NOTE — Progress Notes (Signed)
At bedside for IVIG. Primary RN at bedside. Educated on dosage, calculations, programming the IV pump, priming the line with D5W and s/s to be aware of during administration. RN to obtain vitals prior to infusion, and every 15 minutes at infusion titration. All questions answered and RN verbalized understanding.

## 2022-02-27 ENCOUNTER — Inpatient Hospital Stay (HOSPITAL_COMMUNITY): Payer: BC Managed Care – PPO | Admitting: Anesthesiology

## 2022-02-27 ENCOUNTER — Other Ambulatory Visit: Payer: Self-pay

## 2022-02-27 ENCOUNTER — Encounter (HOSPITAL_COMMUNITY): Payer: Self-pay | Admitting: Obstetrics and Gynecology

## 2022-02-27 ENCOUNTER — Encounter (HOSPITAL_COMMUNITY)
Admission: RE | Disposition: A | Discharge status: Home / Self Care | Payer: Self-pay | Source: Home / Self Care | Attending: Obstetrics and Gynecology

## 2022-02-27 DIAGNOSIS — O2402 Pre-existing diabetes mellitus, type 1, in childbirth: Secondary | ICD-10-CM

## 2022-02-27 DIAGNOSIS — Z3A33 33 weeks gestation of pregnancy: Secondary | ICD-10-CM

## 2022-02-27 DIAGNOSIS — Z98891 History of uterine scar from previous surgery: Secondary | ICD-10-CM | POA: Insufficient documentation

## 2022-02-27 DIAGNOSIS — O1092 Unspecified pre-existing hypertension complicating childbirth: Secondary | ICD-10-CM

## 2022-02-27 DIAGNOSIS — O365931 Maternal care for other known or suspected poor fetal growth, third trimester, fetus 1: Secondary | ICD-10-CM

## 2022-02-27 LAB — CBC
HCT: 30.6 % — ABNORMAL LOW (ref 36.0–46.0)
HCT: 26.1 % — ABNORMAL LOW (ref 36.0–46.0)
Hemoglobin: 10 g/dL — ABNORMAL LOW (ref 12.0–15.0)
Hemoglobin: 8.3 g/dL — ABNORMAL LOW (ref 12.0–15.0)
MCH: 30.9 pg (ref 26.0–34.0)
MCH: 30.5 pg (ref 26.0–34.0)
MCHC: 32.7 g/dL (ref 30.0–36.0)
MCHC: 31.8 g/dL (ref 30.0–36.0)
MCV: 94.4 fL (ref 80.0–100.0)
MCV: 96 fL (ref 80.0–100.0)
Platelets: 110 10*3/uL — ABNORMAL LOW (ref 150–400)
Platelets: 118 10*3/uL — ABNORMAL LOW (ref 150–400)
RBC: 3.24 MIL/uL — ABNORMAL LOW (ref 3.87–5.11)
RBC: 2.72 MIL/uL — ABNORMAL LOW (ref 3.87–5.11)
RDW: 16.7 % — ABNORMAL HIGH (ref 11.5–15.5)
RDW: 16.7 % — ABNORMAL HIGH (ref 11.5–15.5)
WBC: 10.8 10*3/uL — ABNORMAL HIGH (ref 4.0–10.5)
WBC: 14.5 10*3/uL — ABNORMAL HIGH (ref 4.0–10.5)
nRBC: 0 % (ref 0.0–0.2)
nRBC: 0 % (ref 0.0–0.2)

## 2022-02-27 LAB — RPR: RPR Ser Ql: NONREACTIVE

## 2022-02-27 LAB — BASIC METABOLIC PANEL
Anion gap: 10 (ref 5–15)
BUN: 15 mg/dL (ref 6–20)
CO2: 21 mmol/L — ABNORMAL LOW (ref 22–32)
Calcium: 8.7 mg/dL — ABNORMAL LOW (ref 8.9–10.3)
Chloride: 105 mmol/L (ref 98–111)
Creatinine, Ser: 0.61 mg/dL (ref 0.44–1.00)
GFR, Estimated: >60 mL/min (ref >60)
Glucose, Bld: 92 mg/dL (ref 70–99)
Potassium: 3.5 mmol/L (ref 3.5–5.1)
Sodium: 136 mmol/L (ref 135–145)

## 2022-02-27 LAB — GLUCOSE, CAPILLARY
Glucose-Capillary: 102 mg/dL — ABNORMAL HIGH (ref 70–99)
Glucose-Capillary: 123 mg/dL — ABNORMAL HIGH (ref 70–99)
Glucose-Capillary: 117 mg/dL — ABNORMAL HIGH (ref 70–99)
Glucose-Capillary: 123 mg/dL — ABNORMAL HIGH (ref 70–99)
Glucose-Capillary: 98 mg/dL (ref 70–99)
Glucose-Capillary: 88 mg/dL (ref 70–99)
Glucose-Capillary: 118 mg/dL — ABNORMAL HIGH (ref 70–99)
Glucose-Capillary: 110 mg/dL — ABNORMAL HIGH (ref 70–99)
Glucose-Capillary: 105 mg/dL — ABNORMAL HIGH (ref 70–99)
Glucose-Capillary: 107 mg/dL — ABNORMAL HIGH (ref 70–99)
Glucose-Capillary: 166 mg/dL — ABNORMAL HIGH (ref 70–99)
Glucose-Capillary: 326 mg/dL — ABNORMAL HIGH (ref 70–99)
Glucose-Capillary: 330 mg/dL — ABNORMAL HIGH (ref 70–99)
Glucose-Capillary: 305 mg/dL — ABNORMAL HIGH (ref 70–99)
Glucose-Capillary: 293 mg/dL — ABNORMAL HIGH (ref 70–99)
Glucose-Capillary: 266 mg/dL — ABNORMAL HIGH (ref 70–99)
Glucose-Capillary: 206 mg/dL — ABNORMAL HIGH (ref 70–99)

## 2022-02-27 SURGERY — Surgical Case
Anesthesia: Spinal

## 2022-02-27 MED ORDER — WITCH HAZEL-GLYCERIN EX PADS: 1.0000 | MEDICATED_PAD | CUTANEOUS | Status: DC | PRN

## 2022-02-27 MED ORDER — SUGAMMADEX SODIUM 200 MG/2ML IV SOLN
INTRAVENOUS | Status: DC | PRN
  Administered 2022-02-27: 200 mg via INTRAVENOUS

## 2022-02-27 MED ORDER — PRENATAL MULTIVITAMIN CH
1.0000 | ORAL_TABLET | Freq: Every day | ORAL | Status: DC
  Administered 2022-02-28 – 2022-03-04 (×5): 1 via ORAL
  Filled 2022-02-27 (×5): qty 1

## 2022-02-27 MED ORDER — PRENATAL MULTIVITAMIN CH: 1.0000 | ORAL_TABLET | Freq: Every day | ORAL | Status: DC

## 2022-02-27 MED ORDER — LACTATED RINGERS IV SOLN: INTRAVENOUS | Status: DC | PRN

## 2022-02-27 MED ORDER — PHENYLEPHRINE HCL-NACL 20-0.9 MG/250ML-% IV SOLN
INTRAVENOUS | Status: AC
  Filled 2022-02-27: qty 250

## 2022-02-27 MED ORDER — ROCURONIUM BROMIDE 100 MG/10ML IV SOLN
INTRAVENOUS | Status: DC | PRN
  Administered 2022-02-27: 40 mg via INTRAVENOUS

## 2022-02-27 MED ORDER — OXYCODONE HCL 5 MG PO TABS: 5.0000 mg | ORAL_TABLET | Freq: Once | ORAL | Status: DC | PRN

## 2022-02-27 MED ORDER — SENNOSIDES-DOCUSATE SODIUM 8.6-50 MG PO TABS
2.0000 | ORAL_TABLET | ORAL | Status: DC
  Administered 2022-02-27 – 2022-02-28 (×2): 2 via ORAL
  Filled 2022-02-27 (×3): qty 2

## 2022-02-27 MED ORDER — OXYCODONE-ACETAMINOPHEN 5-325 MG PO TABS: 1.0000 | ORAL_TABLET | ORAL | Status: DC | PRN

## 2022-02-27 MED ORDER — SODIUM CHLORIDE 0.9 % IV SOLN
INTRAVENOUS | Status: AC
  Filled 2022-02-27: qty 2

## 2022-02-27 MED ORDER — SODIUM CHLORIDE 0.9% FLUSH: 9.0000 mL | INTRAVENOUS | Status: DC | PRN

## 2022-02-27 MED ORDER — DIPHENHYDRAMINE HCL 25 MG PO CAPS: 25.0000 mg | ORAL_CAPSULE | ORAL | Status: DC | PRN

## 2022-02-27 MED ORDER — MENTHOL 3 MG MT LOZG: 1.0000 | LOZENGE | OROMUCOSAL | Status: DC | PRN

## 2022-02-27 MED ORDER — ONDANSETRON HCL 4 MG/2ML IJ SOLN
INTRAMUSCULAR | Status: AC
  Filled 2022-02-27: qty 2

## 2022-02-27 MED ORDER — LACTATED RINGERS IV SOLN: INTRAVENOUS | Status: DC

## 2022-02-27 MED ORDER — ONDANSETRON HCL 4 MG/2ML IJ SOLN: 4.0000 mg | Freq: Three times a day (TID) | INTRAMUSCULAR | Status: DC | PRN

## 2022-02-27 MED ORDER — SOD CITRATE-CITRIC ACID 500-334 MG/5ML PO SOLN
30.0000 mL | ORAL | Status: AC
  Administered 2022-02-27: 30 mL via ORAL
  Filled 2022-02-27: qty 30

## 2022-02-27 MED ORDER — INSULIN GLARGINE-YFGN 100 UNIT/ML ~~LOC~~ SOLN
8.0000 [IU] | Freq: Once | SUBCUTANEOUS | Status: AC
  Administered 2022-02-27: 8 [IU] via SUBCUTANEOUS
  Filled 2022-02-27 (×2): qty 0.08

## 2022-02-27 MED ORDER — LABETALOL HCL 200 MG PO TABS
200.0000 mg | ORAL_TABLET | Freq: Two times a day (BID) | ORAL | Status: DC
  Administered 2022-02-27 – 2022-03-01 (×5): 200 mg via ORAL
  Filled 2022-02-27 (×5): qty 1

## 2022-02-27 MED ORDER — KETOROLAC TROMETHAMINE 30 MG/ML IJ SOLN
30.0000 mg | Freq: Four times a day (QID) | INTRAMUSCULAR | Status: AC | PRN
  Administered 2022-02-27 – 2022-02-28 (×2): 30 mg via INTRAVENOUS
  Filled 2022-02-27 (×2): qty 1

## 2022-02-27 MED ORDER — DIPHENHYDRAMINE HCL 12.5 MG/5ML PO ELIX
12.5000 mg | ORAL_SOLUTION | Freq: Four times a day (QID) | ORAL | Status: DC | PRN
  Filled 2022-02-27: qty 5

## 2022-02-27 MED ORDER — NALOXONE HCL 0.4 MG/ML IJ SOLN: 0.4000 mg | INTRAMUSCULAR | Status: DC | PRN

## 2022-02-27 MED ORDER — LABETALOL HCL 200 MG PO TABS: 200.0000 mg | ORAL_TABLET | Freq: Two times a day (BID) | ORAL | Status: DC

## 2022-02-27 MED ORDER — IBUPROFEN 600 MG PO TABS
600.0000 mg | ORAL_TABLET | Freq: Four times a day (QID) | ORAL | Status: AC
  Administered 2022-02-28 – 2022-03-02 (×8): 600 mg via ORAL
  Filled 2022-02-27 (×9): qty 1

## 2022-02-27 MED ORDER — INSULIN ASPART 100 UNIT/ML IJ SOLN
0.0000 [IU] | INTRAMUSCULAR | Status: DC
  Administered 2022-02-27: 15 [IU] via SUBCUTANEOUS
  Administered 2022-02-27: 7 [IU] via SUBCUTANEOUS
  Administered 2022-02-27: 4 [IU] via SUBCUTANEOUS
  Administered 2022-02-28 (×2): 3 [IU] via SUBCUTANEOUS
  Filled 2022-02-27: qty 0.2

## 2022-02-27 MED ORDER — OXYTOCIN-SODIUM CHLORIDE 30-0.9 UT/500ML-% IV SOLN
INTRAVENOUS | Status: DC | PRN
  Administered 2022-02-27: 400 mL via INTRAVENOUS

## 2022-02-27 MED ORDER — ONDANSETRON HCL 4 MG/2ML IJ SOLN: 4.0000 mg | Freq: Four times a day (QID) | INTRAMUSCULAR | Status: DC | PRN

## 2022-02-27 MED ORDER — LIDOCAINE HCL 1 % IJ SOLN
INTRAMUSCULAR | Status: AC
  Filled 2022-02-27: qty 20

## 2022-02-27 MED ORDER — SIMETHICONE 80 MG PO CHEW: 80.0000 mg | CHEWABLE_TABLET | ORAL | Status: DC | PRN

## 2022-02-27 MED ORDER — DIPHENHYDRAMINE HCL 25 MG PO CAPS: 25.0000 mg | ORAL_CAPSULE | Freq: Four times a day (QID) | ORAL | Status: DC | PRN

## 2022-02-27 MED ORDER — INSULIN ASPART 100 UNIT/ML IJ SOLN: 8.0000 [IU] | Freq: Three times a day (TID) | INTRAMUSCULAR | Status: DC

## 2022-02-27 MED ORDER — LIDOCAINE HCL (CARDIAC) PF 100 MG/5ML IV SOSY
PREFILLED_SYRINGE | INTRAVENOUS | Status: DC | PRN
  Administered 2022-02-27: 50 mg via INTRATRACHEAL

## 2022-02-27 MED ORDER — PROPOFOL 500 MG/50ML IV EMUL
INTRAVENOUS | Status: DC | PRN
  Administered 2022-02-27: 200 mg via INTRAVENOUS

## 2022-02-27 MED ORDER — ACETAMINOPHEN 325 MG PO TABS: 325.0000 mg | ORAL_TABLET | ORAL | Status: DC | PRN

## 2022-02-27 MED ORDER — DEXAMETHASONE SODIUM PHOSPHATE 4 MG/ML IJ SOLN
INTRAMUSCULAR | Status: AC
  Filled 2022-02-27: qty 1

## 2022-02-27 MED ORDER — ACETAMINOPHEN 160 MG/5ML PO SOLN: 325.0000 mg | ORAL | Status: DC | PRN

## 2022-02-27 MED ORDER — OXYTOCIN-SODIUM CHLORIDE 30-0.9 UT/500ML-% IV SOLN: 2.5000 [IU]/h | INTRAVENOUS | Status: AC

## 2022-02-27 MED ORDER — SCOPOLAMINE 1 MG/3DAYS TD PT72: 1.0000 | MEDICATED_PATCH | Freq: Once | TRANSDERMAL | Status: DC

## 2022-02-27 MED ORDER — INSULIN ASPART 100 UNIT/ML IJ SOLN: 0.0000 [IU] | Freq: Every day | INTRAMUSCULAR | Status: DC

## 2022-02-27 MED ORDER — FENTANYL 50 MCG/ML IV PCA SOLN
INTRAVENOUS | Status: DC
  Administered 2022-02-27: 50 ug via INTRAVENOUS
  Filled 2022-02-27: qty 25

## 2022-02-27 MED ORDER — ZOLPIDEM TARTRATE 5 MG PO TABS
5.0000 mg | ORAL_TABLET | Freq: Every evening | ORAL | Status: DC | PRN
  Administered 2022-03-02 – 2022-03-04 (×2): 5 mg via ORAL
  Filled 2022-02-27 (×2): qty 1

## 2022-02-27 MED ORDER — NALOXONE HCL 4 MG/10ML IJ SOLN: 1.0000 ug/kg/h | INTRAVENOUS | Status: DC | PRN

## 2022-02-27 MED ORDER — SIMETHICONE 80 MG PO CHEW
80.0000 mg | CHEWABLE_TABLET | Freq: Three times a day (TID) | ORAL | Status: DC
  Administered 2022-02-27 – 2022-03-05 (×17): 80 mg via ORAL
  Filled 2022-02-27 (×17): qty 1

## 2022-02-27 MED ORDER — INSULIN GLARGINE-YFGN 100 UNIT/ML ~~LOC~~ SOLN
36.0000 [IU] | Freq: Every day | SUBCUTANEOUS | Status: DC
  Filled 2022-02-27: qty 0.36

## 2022-02-27 MED ORDER — OXYCODONE HCL 5 MG/5ML PO SOLN: 5.0000 mg | Freq: Once | ORAL | Status: DC | PRN

## 2022-02-27 MED ORDER — STERILE WATER FOR IRRIGATION IR SOLN
Status: DC | PRN
  Administered 2022-02-27: 1

## 2022-02-27 MED ORDER — FENTANYL CITRATE (PF) 100 MCG/2ML IJ SOLN
INTRAMUSCULAR | Status: AC
  Filled 2022-02-27: qty 2

## 2022-02-27 MED ORDER — ONDANSETRON HCL 4 MG/2ML IJ SOLN
INTRAMUSCULAR | Status: DC | PRN
  Administered 2022-02-27: 4 mg via INTRAVENOUS

## 2022-02-27 MED ORDER — MEPERIDINE HCL 25 MG/ML IJ SOLN: 6.2500 mg | INTRAMUSCULAR | Status: DC | PRN

## 2022-02-27 MED ORDER — FENTANYL CITRATE (PF) 100 MCG/2ML IJ SOLN
25.0000 ug | INTRAMUSCULAR | Status: DC | PRN
  Administered 2022-02-27 (×2): 50 ug via INTRAVENOUS

## 2022-02-27 MED ORDER — ACETAMINOPHEN 325 MG PO TABS
650.0000 mg | ORAL_TABLET | ORAL | Status: DC | PRN
  Administered 2022-03-03: 650 mg via ORAL
  Filled 2022-02-27: qty 2

## 2022-02-27 MED ORDER — SODIUM CHLORIDE 0.9 % IV SOLN
2.0000 g | INTRAVENOUS | Status: AC
  Administered 2022-02-27: 2 g via INTRAVENOUS
  Filled 2022-02-27: qty 2

## 2022-02-27 MED ORDER — SCOPOLAMINE 1 MG/3DAYS TD PT72
MEDICATED_PATCH | TRANSDERMAL | Status: DC | PRN
  Administered 2022-02-27: 1 via TRANSDERMAL

## 2022-02-27 MED ORDER — INSULIN GLARGINE-YFGN 100 UNIT/ML ~~LOC~~ SOLN
28.0000 [IU] | Freq: Every day | SUBCUTANEOUS | Status: DC
  Administered 2022-02-27: 28 [IU] via SUBCUTANEOUS
  Filled 2022-02-27: qty 0.28

## 2022-02-27 MED ORDER — DEXAMETHASONE SODIUM PHOSPHATE 4 MG/ML IJ SOLN
INTRAMUSCULAR | Status: DC | PRN
  Administered 2022-02-27: 4 mg via INTRAVENOUS

## 2022-02-27 MED ORDER — INSULIN ASPART 100 UNIT/ML IJ SOLN
8.0000 [IU] | Freq: Three times a day (TID) | INTRAMUSCULAR | Status: DC
  Administered 2022-02-28 (×3): 8 [IU] via SUBCUTANEOUS
  Filled 2022-02-27: qty 0.08

## 2022-02-27 MED ORDER — FENTANYL CITRATE (PF) 100 MCG/2ML IJ SOLN
INTRAMUSCULAR | Status: DC | PRN
  Administered 2022-02-27 (×2): 50 ug via INTRAVENOUS
  Administered 2022-02-27: 25 ug via INTRAVENOUS
  Administered 2022-02-27: 50 ug via INTRAVENOUS
  Administered 2022-02-27: 100 ug via INTRAVENOUS
  Administered 2022-02-27: 60 ug via INTRAVENOUS

## 2022-02-27 MED ORDER — MORPHINE SULFATE (PF) 0.5 MG/ML IJ SOLN
INTRAMUSCULAR | Status: AC
  Filled 2022-02-27: qty 10

## 2022-02-27 MED ORDER — DIPHENHYDRAMINE HCL 50 MG/ML IJ SOLN: 12.5000 mg | Freq: Four times a day (QID) | INTRAMUSCULAR | Status: DC | PRN

## 2022-02-27 MED ORDER — INSULIN ASPART 100 UNIT/ML IJ SOLN: 0.0000 [IU] | Freq: Three times a day (TID) | INTRAMUSCULAR | Status: DC

## 2022-02-27 MED ORDER — INSULIN ASPART 100 UNIT/ML IJ SOLN
0.0000 [IU] | INTRAMUSCULAR | Status: DC
  Filled 2022-02-27: qty 0.2

## 2022-02-27 MED ORDER — FENTANYL CITRATE (PF) 100 MCG/2ML IJ SOLN: 25.0000 ug | INTRAMUSCULAR | Status: DC | PRN

## 2022-02-27 MED ORDER — ONDANSETRON HCL 4 MG/2ML IJ SOLN: 4.0000 mg | Freq: Once | INTRAMUSCULAR | Status: DC | PRN

## 2022-02-27 MED ORDER — TETANUS-DIPHTH-ACELL PERTUSSIS 5-2.5-18.5 LF-MCG/0.5 IM SUSY: 0.5000 mL | PREFILLED_SYRINGE | Freq: Once | INTRAMUSCULAR | Status: DC

## 2022-02-27 MED ORDER — ACETAMINOPHEN 10 MG/ML IV SOLN
INTRAVENOUS | Status: DC | PRN
  Administered 2022-02-27: 1000 mg via INTRAVENOUS

## 2022-02-27 MED ORDER — ACETAMINOPHEN 10 MG/ML IV SOLN
INTRAVENOUS | Status: AC
  Filled 2022-02-27: qty 100

## 2022-02-27 MED ORDER — HYDROCORTISONE SOD SUC (PF) 100 MG IJ SOLR
50.0000 mg | Freq: Once | INTRAMUSCULAR | Status: AC
  Administered 2022-02-27: 50 mg via INTRAVENOUS
  Filled 2022-02-27: qty 1

## 2022-02-27 MED ORDER — DIPHENHYDRAMINE HCL 50 MG/ML IJ SOLN: 12.5000 mg | INTRAMUSCULAR | Status: DC | PRN

## 2022-02-27 MED ORDER — DIBUCAINE (PERIANAL) 1 % EX OINT: 1.0000 | TOPICAL_OINTMENT | CUTANEOUS | Status: DC | PRN

## 2022-02-27 MED ORDER — MEASLES, MUMPS & RUBELLA VAC IJ SOLR: 0.5000 mL | Freq: Once | INTRAMUSCULAR | Status: DC

## 2022-02-27 MED ORDER — FENTANYL 50 MCG/ML IV PCA SOLN
INTRAVENOUS | Status: DC
  Administered 2022-02-27: 75 ug via INTRAVENOUS
  Administered 2022-02-27: 135 ug via INTRAVENOUS
  Administered 2022-02-27: 195 ug via INTRAVENOUS
  Administered 2022-02-28: 75 ug via INTRAVENOUS
  Administered 2022-02-28: 30 ug via INTRAVENOUS
  Administered 2022-02-28: 60 ug via INTRAVENOUS
  Administered 2022-02-28: 30 ug via INTRAVENOUS
  Filled 2022-02-27: qty 25

## 2022-02-27 MED ORDER — SODIUM CHLORIDE 0.9% FLUSH: 3.0000 mL | INTRAVENOUS | Status: DC | PRN

## 2022-02-27 MED ORDER — OXYTOCIN-SODIUM CHLORIDE 30-0.9 UT/500ML-% IV SOLN
INTRAVENOUS | Status: AC
  Filled 2022-02-27: qty 500

## 2022-02-27 MED ORDER — KETOROLAC TROMETHAMINE 30 MG/ML IJ SOLN: 30.0000 mg | Freq: Four times a day (QID) | INTRAMUSCULAR | Status: AC | PRN

## 2022-02-27 MED ORDER — FENTANYL CITRATE (PF) 250 MCG/5ML IJ SOLN
INTRAMUSCULAR | Status: AC
  Filled 2022-02-27: qty 5

## 2022-02-27 MED ORDER — COCONUT OIL OIL: 1.0000 | TOPICAL_OIL | Status: DC | PRN

## 2022-02-27 MED ORDER — DIPHENHYDRAMINE HCL 12.5 MG/5ML PO ELIX: 12.5000 mg | ORAL_SOLUTION | Freq: Four times a day (QID) | ORAL | Status: DC | PRN

## 2022-02-27 SURGICAL SUPPLY — 28 items
CHLORAPREP W/TINT 26ML (MISCELLANEOUS) ×2 IMPLANT
CLAMP UMBILICAL CORD (MISCELLANEOUS) ×1 IMPLANT
CLOTH BEACON ORANGE TIMEOUT ST (SAFETY) ×1 IMPLANT
DRSG OPSITE POSTOP 4X10 (GAUZE/BANDAGES/DRESSINGS) ×1 IMPLANT
ELECT REM PT RETURN 9FT ADLT (ELECTROSURGICAL) ×1
ELECTRODE REM PT RTRN 9FT ADLT (ELECTROSURGICAL) ×1 IMPLANT
EXTRACTOR VACUUM M CUP 4 TUBE (SUCTIONS) IMPLANT
GLOVE BIOGEL PI IND STRL 7.0 (GLOVE) ×1 IMPLANT
GLOVE SURG ORTHO 8.0 STRL STRW (GLOVE) ×1 IMPLANT
GOWN STRL REUS W/TWL LRG LVL3 (GOWN DISPOSABLE) ×2 IMPLANT
HEMOSTAT ARISTA ABSORB 3G PWDR (HEMOSTASIS) IMPLANT
KIT ABG SYR 3ML LUER SLIP (SYRINGE) ×1 IMPLANT
MAT PREVALON FULL STRYKER (MISCELLANEOUS) IMPLANT
NDL HYPO 25X5/8 SAFETYGLIDE (NEEDLE) ×1 IMPLANT
NEEDLE HYPO 25X5/8 SAFETYGLIDE (NEEDLE) ×1 IMPLANT
NS IRRIG 1000ML POUR BTL (IV SOLUTION) ×1 IMPLANT
PACK C SECTION WH (CUSTOM PROCEDURE TRAY) ×1 IMPLANT
PAD OB MATERNITY 4.3X12.25 (PERSONAL CARE ITEMS) ×1 IMPLANT
SUT MNCRL 0 VIOLET CTX 36 (SUTURE) ×3 IMPLANT
SUT MON AB 4-0 PS1 27 (SUTURE) ×1 IMPLANT
SUT MONOCRYL 0 CTX 36 (SUTURE) ×4
SUT PDS AB 1 CT  36 (SUTURE) ×1
SUT PDS AB 1 CT 36 (SUTURE) IMPLANT
SUT VIC AB 1 CTX 36 (SUTURE)
SUT VIC AB 1 CTX36XBRD ANBCTRL (SUTURE) IMPLANT
TOWEL OR 17X24 6PK STRL BLUE (TOWEL DISPOSABLE) ×1 IMPLANT
TRAY FOLEY W/BAG SLVR 14FR LF (SET/KITS/TRAYS/PACK) ×1 IMPLANT
WATER STERILE IRR 1000ML POUR (IV SOLUTION) ×1 IMPLANT

## 2022-02-27 NOTE — Anesthesia Postprocedure Evaluation (Signed)
Anesthesia Post Note  Patient: Jessica Kelley  Procedure(s) Performed: [place]     Patient location during evaluation: PACU Anesthesia Type: Spinal Level of consciousness: oriented and awake and alert Pain management: pain level controlled Vital Signs Assessment: post-procedure vital signs reviewed and stable Respiratory status: spontaneous breathing, respiratory function stable and patient connected to nasal cannula oxygen Cardiovascular status: blood pressure returned to baseline and stable Postop Assessment: no headache, no backache and no apparent nausea or vomiting Anesthetic complications: no   No notable events documented.  Last Vitals:  Vitals:   02/27/22 1416 02/27/22 1433  BP:  (!) 149/86  Pulse:  94  Resp:  14  Temp:    SpO2: 92%     Last Pain:  Vitals:   02/27/22 1417  TempSrc:   PainSc: 5    Pain Goal:    LLE Motor Response: Purposeful movement (02/27/22 1345) LLE Sensation: Full sensation (02/27/22 1345) RLE Motor Response: Developmentally delayed, Other (Comment) (02/27/22 1345) RLE Sensation: Full sensation (02/27/22 1345)     Epidural/Spinal Function Cutaneous sensation: Able to Wiggle Toes (02/27/22 1345), Patient able to flex knees: No (02/27/22 1345), Patient able to lift hips off bed: No (02/27/22 1345), Back pain beyond tenderness at insertion site: No (02/27/22 1345), Progressively worsening motor and/or sensory loss: No (02/27/22 1345), Bowel and/or bladder incontinence post epidural: No (02/27/22 1345)  [name]

## 2022-02-27 NOTE — Transfer of Care (Signed)
Immediate Anesthesia Transfer of Care Note  Patient: Jessica Kelley  Procedure(s) Performed: CESAREAN SECTION  Patient Location: PACU  Anesthesia Type:General  Level of Consciousness: awake, alert , and patient cooperative  Airway & Oxygen Therapy: Patient Spontanous Breathing  Post-op Assessment: Report given to RN, Post -op Vital signs reviewed and stable, and Patient moving all extremities X 4  Post vital signs: Reviewed and stable  Last Vitals:  Vitals Value Taken Time  BP 130/71 02/27/22 1303  Temp    Pulse    Resp 20 02/27/22 1308  SpO2    Vitals shown include unvalidated device data.  Last Pain:  Vitals:   02/27/22 0813  TempSrc: Oral  PainSc:          Complications: No notable events documented.

## 2022-02-27 NOTE — Progress Notes (Signed)
Asked to assisted Dr. Corinna Capra with c-section of the patient.   An experienced assistant was required given the standard of surgical care given the complexity of the case.  This assistant was needed for exposure, dissection, suctioning, retraction, instrument exchange, assisting with delivery with administration of fundal pressure, and for overall help during the procedure.  Gerlene Fee, DO OB Fellow, [place] [facility] [place] 02/27/2022, 11:39 AM

## 2022-02-27 NOTE — Lactation Note (Signed)
This note was copied from a baby's chart. Lactation Consultation Note Mother not yet ready for [place] visit. RN present and agreed to set up pump/provide pumping ed when pt is ready. [place] team to f/u in the morning.    Patient Name: Jessica Kelley Date: 02/27/2022   Age:29 hours   [name] 02/27/2022, 3:28 PM

## 2022-02-27 NOTE — Progress Notes (Signed)
Patient ID: Jessica Kelley, female   DOB: Nov 27, 1990, 31 y.o.   MRN: [id number] [name] without complaints.  Questions about c/s and process   Value Min Max  Temp 97.6 F (36.4 C) 98.4 F (36.9 C)  Pulse Rate 106 Abnormal  116 Abnormal   Resp 16 21 Abnormal   BP: Systolic 681 594 Abnormal   BP: Diastolic 58 Abnormal  90 Abnormal   SpO2 96 % 99 %   FHR 145 with accels Ctxs rare  Abd Gravid nt Neg homans bilaterally          A/P: 30yo G2P0010 @ 28w6dadmitted for observation due to intermittent AEDF and FGR.  - FGR - dopplers with iAEDF, for repeat today. Per MFM, if persistent will plan for delivery at 3Guy S/p NICU consultation and steroids. - Type 1 DM on CGM and insulin pump, fetal echo normal. Diabetes coordinator consulted yesterday. Appreciate assistance. 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 After delivery of child may change to 70 gm once per week, per her request for convenience. Continue Solumedrol 500 mg weekly. This is per neurology. Will be for stress dose steroids prior to delivery. Wheelchair bound, has weakness in both legs. - cHTN - on labetalol 202mBID. Will hold LDA. - Hypothyrodism s/p Hyperthyroidism - previously on PTU, no meds currently. TSH on admission wnl. 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 - Anxiety - no meds, mood currently stable - Celiac disease  Currently Heparin on hold and NPO T&S and CBC pending Anesthesia to discuss options Risks and benefits of C/S were discussed.  All questions were answered and informed consent was obtained.  Plan to proceed with low segment transverse Cesarean Section.

## 2022-02-27 NOTE — Anesthesia Procedure Notes (Signed)
Spinal  Patient location during procedure: OR Start time: 02/27/2022 11:45 AM End time: 02/27/2022 11:55 AM Reason for block: surgical anesthesia Staffing Anesthesiologist: Janeece Riggers, MD Performed by: Janeece Riggers, MD Authorized by: Janeece Riggers, MD   Preanesthetic Checklist Completed: patient identified, IV checked, site marked, risks and benefits discussed, surgical consent, monitors and equipment checked, pre-op evaluation and timeout performed Spinal Block Patient position: sitting Prep: DuraPrep Patient monitoring: heart rate, cardiac monitor, continuous pulse ox and blood pressure Approach: midline Location: L3-4 Injection technique: single-shot Needle Needle type: Sprotte  Needle gauge: 24 G Needle length: 9 cm Assessment Sensory level: T4 Events: failed spinal Additional Notes Multiple attempts at spinal with out success.

## 2022-02-27 NOTE — Anesthesia Preprocedure Evaluation (Signed)
Anesthesia Evaluation  Patient identified by MRN, date of birth, ID band Patient awake    Reviewed: Allergy & Precautions, H&P , NPO status , Patient's Chart, lab work & pertinent test results  History of Anesthesia Complications (+) history of anesthetic complications  Airway Mallampati: III  TM Distance: >3 FB Neck ROM: Full    Dental no notable dental hx. (+) Teeth Intact, Dental Advisory Given   Pulmonary neg pulmonary ROS,    Pulmonary exam normal breath sounds clear to auscultation       Cardiovascular negative cardio ROS Normal cardiovascular exam Rhythm:Regular Rate:Normal     Neuro/Psych  Headaches, PSYCHIATRIC DISORDERS Anxiety  Neuromuscular disease    GI/Hepatic negative GI ROS, Neg liver ROS,   Endo/Other  negative endocrine ROSdiabetes  Renal/GU negative Renal ROS     Musculoskeletal   Abdominal   Peds  Hematology  (+) Blood dyscrasia, anemia ,   Anesthesia Other Findings   Reproductive/Obstetrics (+) Pregnancy                             Anesthesia Physical Anesthesia Plan  ASA: 3  Anesthesia Plan: Spinal   Post-op Pain Management: Minimal or no pain anticipated   Induction: Intravenous  PONV Risk Score and Plan: 2 and Treatment may vary due to age or medical condition  Airway Management Planned: Natural Airway  Additional Equipment: None  Intra-op Plan:   Post-operative Plan:   Informed Consent: I have reviewed the patients History and Physical, chart, labs and discussed the procedure including the risks, benefits and alternatives for the proposed anesthesia with the patient or authorized representative who has indicated his/her understanding and acceptance.       Plan Discussed with: Anesthesiologist and CRNA  Anesthesia Plan Comments: (Long discussion with patient in the presence of her parents and spouse.  She has opted for a spinal anesthetic and  understands the risks and benefits in her unique situation.  )        Anesthesia Quick Evaluation

## 2022-02-27 NOTE — Anesthesia Procedure Notes (Signed)
Procedure Name: Intubation Date/Time: 02/27/2022 12:05 PM  Performed by: Sandrea Matte, CRNAPre-anesthesia Checklist: Patient identified, Emergency Drugs available, Suction available, Patient being monitored and Timeout performed Patient Re-evaluated:Patient Re-evaluated prior to induction Oxygen Delivery Method: Circle system utilized Preoxygenation: Pre-oxygenation with 100% oxygen Induction Type: IV induction, Rapid sequence and Cricoid Pressure applied Laryngoscope Size: Glidescope and 3 Tube size: 7.0 mm Number of attempts: 1 Airway Equipment and Method: Stylet and Video-laryngoscopy Placement Confirmation: ETT inserted through vocal cords under direct vision, positive ETCO2 and breath sounds checked- equal and bilateral Secured at: 22 cm Tube secured with: Tape Dental Injury: Teeth and Oropharynx as per pre-operative assessment

## 2022-02-27 NOTE — Op Note (Signed)
Cesarean Section Procedure Note  Pre-operative Diagnosis: IUP at 33 6/7 EGA, IUGR with absent end diastolic flow on doppler, Type 1 DM, CHTN, Chronic demyelinating polyradiculopathy  Post-operative Diagnosis: same  Surgeon: Luz Lex   Assistants:  Gerlene Fee, DO  An experienced assistant was required given the standard of surgical care given the complexity of the case.  This assistant was needed for exposure, dissection, suctioning, retraction, instrument exchange, assisting with delivery with administration of fundal pressure, and for overall help during the procedure   Anesthesia: GET  Procedure:  Low Segment Transverse cesarean section  Procedure Details  The patient was seen in the Holding Room. The risks, benefits, complications, treatment options, and expected outcomes were discussed with the patient.  The patient concurred with the proposed plan, giving informed consent.  The site of surgery properly noted/marked.. A Time Out was held and the above information confirmed.  The patient was draped and prepped in the usual sterile manner, then after induction of anesthesia,   A Pfannenstiel incision was made and carried down through the subcutaneous tissue to the fascia. Fascial incision was made and extended transversely. The fascia was separated from the underlying rectus tissue superiorly and inferiorly. The peritoneum was identified and entered. Peritoneal incision was extended longitudinally. The utero-vesical peritoneal reflection was incised transversely and the bladder flap was bluntly freed from the lower uterine segment. A low transverse uterine incision was made. Delivered from vertex presentation was a baby with Apgar scores given by the NICU in attendace. After the umbilical cord was clamped and cut cord blood was obtained for evaluation. The placenta was removed intact and appeared normal. The uterine outline, tubes and ovaries appeared normal. The uterine incision was  closed with running locked sutures of 0 monocryl and imbricated with 0 monocryl. Hemostasis was observed. Lavage was carried out until clear. The peritoneum was then closed with 0 monocryl and rectus muscles plicated in the midline.  After hemostasis was assured, the fascia was then reapproximated with running sutures of 1 PDS. Irrigation was applied and after adequate hemostasis was assured, the skin was reapproximated with subcutaneous sutures using 4-0 monocryl.  Instrument, sponge, and needle counts were correct prior the abdominal closure and at the conclusion of the case. The patient received 2 grams cefotetan preoperatively.  Findings: Viable  female  Estimated Blood Loss:  1853cc         Specimens: Placenta was sent to labor and delivery         Complications:  None

## 2022-02-27 NOTE — Progress Notes (Signed)
Pt stated that she felt like her blood sugar was going up. CBG obtained and was 330. Notified diabetes coordinator, who recommended that she gets 8 units of semglee now and then recheck CBG around 5pm and give sliding scale based off from that. Called Dr. Corinna Capra and left voicemail. Waiting for MD response.

## 2022-02-27 NOTE — Inpatient Diabetes Management (Signed)
Inpatient Diabetes Program Recommendations  AACE/ADA: New Consensus Statement on Inpatient Glycemic Control (2015)  Target Ranges:  Prepandial:   less than 140 mg/dL      Peak postprandial:   less than 180 mg/dL (1-2 hours)      Critically ill patients:  140 - 180 mg/dL   Lab Results  Component Value Date   GLUCAP 107 (H) 02/27/2022   HGBA1C 8.0 (H) 08/28/2021    Review of Glycemic Control  Latest Reference Range & Units 02/27/22 06:16 02/27/22 07:22 02/27/22 08:19 02/27/22 10:20  Glucose-Capillary 70 - 99 mg/dL 118 (H) 110 (H) 105 (H) 107 (H)  (H): Data is abnormally high Diabetes history: DM1; [redacted]W[redacted]D Outpatient Diabetes medications: Insulin Pump with Humalog, Dexcom CGM Current orders for Inpatient glycemic control: Insulin pump Solumedrol Qwk BMZ 12 mg 1(2) Solucortef 50 mg x 1  Inpatient Diabetes Program Recommendations:    After delivery (in PACU) consider: -Semglee 36 units two hours prior to discontinuation of IV insulin, then QD to follow.  -Novolog 0-15 units Q4H -Novolog 8 units TID (assuming patient is consuming >50% of meals)  Times of scheduled insulin injections may not align in [facility], will further adjust on 10/12.   Thanks, Bronson Curb, MSN, RNC-OB Diabetes Coordinator [phone] (8a-5p)

## 2022-02-27 NOTE — Progress Notes (Signed)
Spoke with Jessica Kelley- Diabetes Coordinator about patients orders and her last CBG reading. She is aware and no new orders given. Long acting and short acting insulin was given.

## 2022-02-28 ENCOUNTER — Encounter (HOSPITAL_COMMUNITY): Payer: Self-pay | Admitting: Obstetrics and Gynecology

## 2022-02-28 LAB — CBC
HCT: 17.8 % — ABNORMAL LOW (ref 36.0–46.0)
Hemoglobin: 5.9 g/dL — CL (ref 12.0–15.0)
MCH: 31.4 pg (ref 26.0–34.0)
MCHC: 33.1 g/dL (ref 30.0–36.0)
MCV: 94.7 fL (ref 80.0–100.0)
Platelets: 123 10*3/uL — ABNORMAL LOW (ref 150–400)
RBC: 1.88 MIL/uL — ABNORMAL LOW (ref 3.87–5.11)
RDW: 17.1 % — ABNORMAL HIGH (ref 11.5–15.5)
WBC: 12.4 10*3/uL — ABNORMAL HIGH (ref 4.0–10.5)
nRBC: 0 % (ref 0.0–0.2)

## 2022-02-28 LAB — GLUCOSE, CAPILLARY
Glucose-Capillary: 125 mg/dL — ABNORMAL HIGH (ref 70–99)
Glucose-Capillary: 130 mg/dL — ABNORMAL HIGH (ref 70–99)
Glucose-Capillary: 132 mg/dL — ABNORMAL HIGH (ref 70–99)
Glucose-Capillary: 145 mg/dL — ABNORMAL HIGH (ref 70–99)
Glucose-Capillary: 57 mg/dL — ABNORMAL LOW (ref 70–99)
Glucose-Capillary: 58 mg/dL — ABNORMAL LOW (ref 70–99)
Glucose-Capillary: 72 mg/dL (ref 70–99)
Glucose-Capillary: 95 mg/dL (ref 70–99)

## 2022-02-28 LAB — HEMOGLOBIN A1C
Hgb A1c MFr Bld: 7.2 % — ABNORMAL HIGH (ref 4.8–5.6)
Mean Plasma Glucose: 159.94 mg/dL

## 2022-02-28 LAB — PREPARE RBC (CROSSMATCH)

## 2022-02-28 MED ORDER — INSULIN GLARGINE-YFGN 100 UNIT/ML ~~LOC~~ SOLN
36.0000 [IU] | Freq: Every day | SUBCUTANEOUS | Status: DC
  Administered 2022-02-28: 36 [IU] via SUBCUTANEOUS
  Filled 2022-02-28 (×2): qty 0.36

## 2022-02-28 MED ORDER — DIPHENHYDRAMINE HCL 25 MG PO CAPS
25.0000 mg | ORAL_CAPSULE | Freq: Once | ORAL | Status: AC
  Administered 2022-02-28: 25 mg via ORAL
  Filled 2022-02-28: qty 1

## 2022-02-28 MED ORDER — OXYCODONE HCL 5 MG PO TABS
5.0000 mg | ORAL_TABLET | ORAL | Status: DC | PRN
  Administered 2022-02-28 – 2022-03-01 (×3): 5 mg via ORAL
  Filled 2022-02-28 (×3): qty 1

## 2022-02-28 MED ORDER — SODIUM CHLORIDE 0.9% IV SOLUTION: Freq: Once | INTRAVENOUS | Status: AC

## 2022-02-28 MED ORDER — INSULIN ASPART 100 UNIT/ML IJ SOLN
0.0000 [IU] | Freq: Three times a day (TID) | INTRAMUSCULAR | Status: DC
  Administered 2022-02-28 (×2): 1 [IU] via SUBCUTANEOUS
  Administered 2022-03-01: 2 [IU] via SUBCUTANEOUS
  Administered 2022-03-01: 3 [IU] via SUBCUTANEOUS
  Administered 2022-03-01: 2 [IU] via SUBCUTANEOUS
  Administered 2022-03-02: 3 [IU] via SUBCUTANEOUS
  Administered 2022-03-02: 5 [IU] via SUBCUTANEOUS
  Administered 2022-03-03 – 2022-03-04 (×3): 3 [IU] via SUBCUTANEOUS

## 2022-02-28 MED ORDER — INSULIN ASPART 100 UNIT/ML IJ SOLN
0.0000 [IU] | Freq: Every day | INTRAMUSCULAR | Status: DC
  Administered 2022-03-02 – 2022-03-03 (×2): 2 [IU] via SUBCUTANEOUS

## 2022-02-28 MED ORDER — ACETAMINOPHEN 325 MG PO TABS
650.0000 mg | ORAL_TABLET | Freq: Once | ORAL | Status: AC
  Administered 2022-02-28: 650 mg via ORAL
  Filled 2022-02-28: qty 2

## 2022-02-28 MED ORDER — LACTATED RINGERS IV BOLUS
500.0000 mL | Freq: Once | INTRAVENOUS | Status: AC
  Administered 2022-02-28: 500 mL via INTRAVENOUS

## 2022-02-28 NOTE — Plan of Care (Signed)
  Problem: Education: Goal: Knowledge of General Education information will improve Description: Including pain rating scale, medication(s)/side effects and non-pharmacologic comfort measures Outcome: Progressing   Problem: Health Behavior/Discharge Planning: Goal: Ability to manage health-related needs will improve Outcome: Progressing   Problem: Clinical Measurements: Goal: Ability to maintain clinical measurements within normal limits will improve Outcome: Progressing Goal: Will remain free from infection Outcome: Progressing Goal: Diagnostic test results will improve Outcome: Progressing Goal: Respiratory complications will improve Outcome: Progressing Goal: Cardiovascular complication will be avoided Outcome: Progressing   Problem: Activity: Goal: Risk for activity intolerance will decrease Outcome: Progressing   Problem: Nutrition: Goal: Adequate nutrition will be maintained Outcome: Progressing   Problem: Coping: Goal: Level of anxiety will decrease Outcome: Progressing   Problem: Elimination: Goal: Will not experience complications related to bowel motility Outcome: Progressing Goal: Will not experience complications related to urinary retention Outcome: Progressing   Problem: Pain Managment: Goal: General experience of comfort will improve Outcome: Progressing   Problem: Safety: Goal: Ability to remain free from injury will improve Outcome: Progressing   Problem: Skin Integrity: Goal: Risk for impaired skin integrity will decrease Outcome: Progressing   Problem: Education: Goal: Ability to describe self-care measures that may prevent or decrease complications (Diabetes Survival Skills Education) will improve Outcome: Progressing Goal: Individualized Educational Video(s) Outcome: Progressing   Problem: Coping: Goal: Ability to adjust to condition or change in health will improve Outcome: Progressing   Problem: Fluid Volume: Goal: Ability to  maintain a balanced intake and output will improve Outcome: Progressing   Problem: Health Behavior/Discharge Planning: Goal: Ability to identify and utilize available resources and services will improve Outcome: Progressing Goal: Ability to manage health-related needs will improve Outcome: Progressing   Problem: Metabolic: Goal: Ability to maintain appropriate glucose levels will improve Outcome: Progressing   Problem: Nutritional: Goal: Maintenance of adequate nutrition will improve Outcome: Progressing Goal: Progress toward achieving an optimal weight will improve Outcome: Progressing   Problem: Skin Integrity: Goal: Risk for impaired skin integrity will decrease Outcome: Progressing   Problem: Tissue Perfusion: Goal: Adequacy of tissue perfusion will improve Outcome: Progressing   Problem: Education: Goal: Knowledge of disease or condition will improve Outcome: Progressing Goal: Knowledge of the prescribed therapeutic regimen will improve Outcome: Progressing   Problem: Fluid Volume: Goal: Peripheral tissue perfusion will improve Outcome: Progressing   Problem: Clinical Measurements: Goal: Complications related to disease process, condition or treatment will be avoided or minimized Outcome: Progressing   Problem: Education: Goal: Knowledge of the prescribed therapeutic regimen will improve Outcome: Progressing Goal: Understanding of sexual limitations or changes related to disease process or condition will improve Outcome: Progressing Goal: Individualized Educational Video(s) Outcome: Progressing   Problem: Self-Concept: Goal: Communication of feelings regarding changes in body function or appearance will improve Outcome: Progressing   Problem: Skin Integrity: Goal: Demonstration of wound healing without infection will improve Outcome: Progressing   Problem: Education: Goal: Knowledge of condition will improve Outcome: Progressing Goal: Individualized  Educational Video(s) Outcome: Progressing Goal: Individualized Newborn Educational Video(s) Outcome: Progressing   Problem: Activity: Goal: Will verbalize the importance of balancing activity with adequate rest periods Outcome: Progressing Goal: Ability to tolerate increased activity will improve Outcome: Progressing   Problem: Coping: Goal: Ability to identify and utilize available resources and services will improve Outcome: Progressing   Problem: Life Cycle: Goal: Chance of risk for complications during the postpartum period will decrease Outcome: Progressing

## 2022-02-28 NOTE — Progress Notes (Signed)
Date and time results received: 02/28/22 6:17 AM  Test: CBC Critical Value: Hgb - 5.9  Name of Provider Notified: Corinna Capra, MD  Orders Received? Or Actions Taken?: No new orders given per MD.  Tresa Garter, RN

## 2022-02-28 NOTE — Progress Notes (Signed)
POD # 1  C Section Type 1 DM Chronic HTN Chronic demyelinating polyradiculopathy  Patient is doing well. Hemoglobin 5.9 discussed and waiting on blood transfusion. Baby stable in NICU.  BP 116/70 (BP Location: Right Arm)   Pulse 90   Temp 98.1 F (36.7 C) (Oral)   Resp 18   Ht 5' 5"  (1.651 m)   Wt 85.3 kg   LMP 07/05/2021   SpO2 98%   Breastfeeding Unknown   BMI 31.28 kg/m  Results for orders placed or performed during the hospital encounter of 02/25/22 (from the past 24 hour(s))  CBC     Status: Abnormal   Collection Time: 02/27/22  9:43 AM  Result Value Ref Range   WBC 10.8 (H) 4.0 - 10.5 K/uL   RBC 3.24 (L) 3.87 - 5.11 MIL/uL   Hemoglobin 10.0 (L) 12.0 - 15.0 g/dL   HCT 30.6 (L) 36.0 - 46.0 %   MCV 94.4 80.0 - 100.0 fL   MCH 30.9 26.0 - 34.0 pg   MCHC 32.7 30.0 - 36.0 g/dL   RDW 16.7 (H) 11.5 - 15.5 %   Platelets 110 (L) 150 - 400 K/uL   nRBC 0.0 0.0 - 0.2 %  RPR     Status: None   Collection Time: 02/27/22  9:43 AM  Result Value Ref Range   RPR Ser Ql NON REACTIVE NON REACTIVE  Type and screen [place]     Status: None (Preliminary result)   Collection Time: 02/27/22  9:43 AM  Result Value Ref Range   ABO/RH(D) O POS    Antibody Screen NEG    Sample Expiration      03/02/2022,2359 Performed at [facility], [place] [address] [place], [place] [address]    Unit Number [id number]    Blood Component Type RBC LR PHER1    Unit division 00    Status of Unit ALLOCATED    Transfusion Status OK TO TRANSFUSE    Crossmatch Result Compatible    Unit Number [id number]    Blood Component Type RED CELLS,LR    Unit division 00    Status of Unit ALLOCATED    Transfusion Status OK TO TRANSFUSE    Crossmatch Result Compatible   Hemoglobin A1c     Status: Abnormal   Collection Time: 02/27/22  9:43 AM  Result Value Ref Range   Hgb A1c MFr Bld 7.2 (H) 4.8 - 5.6 %   Mean Plasma Glucose 159.94 mg/dL  Glucose, capillary     Status: Abnormal    Collection Time: 02/27/22 10:20 AM  Result Value Ref Range   Glucose-Capillary 107 (H) 70 - 99 mg/dL  Glucose, capillary     Status: Abnormal   Collection Time: 02/27/22  1:05 PM  Result Value Ref Range   Glucose-Capillary 166 (H) 70 - 99 mg/dL  Glucose, capillary     Status: Abnormal   Collection Time: 02/27/22  2:33 PM  Result Value Ref Range   Glucose-Capillary 326 (H) 70 - 99 mg/dL  CBC     Status: Abnormal   Collection Time: 02/27/22  3:26 PM  Result Value Ref Range   WBC 14.5 (H) 4.0 - 10.5 K/uL   RBC 2.72 (L) 3.87 - 5.11 MIL/uL   Hemoglobin 8.3 (L) 12.0 - 15.0 g/dL   HCT 26.1 (L) 36.0 - 46.0 %   MCV 96.0 80.0 - 100.0 fL   MCH 30.5 26.0 - 34.0 pg   MCHC 31.8 30.0 - 36.0  g/dL   RDW 16.7 (H) 11.5 - 15.5 %   Platelets 118 (L) 150 - 400 K/uL   nRBC 0.0 0.0 - 0.2 %  Glucose, capillary     Status: Abnormal   Collection Time: 02/27/22  3:35 PM  Result Value Ref Range   Glucose-Capillary 330 (H) 70 - 99 mg/dL  Glucose, capillary     Status: Abnormal   Collection Time: 02/27/22  5:04 PM  Result Value Ref Range   Glucose-Capillary 305 (H) 70 - 99 mg/dL  Glucose, capillary     Status: Abnormal   Collection Time: 02/27/22  6:16 PM  Result Value Ref Range   Glucose-Capillary 293 (H) 70 - 99 mg/dL  Glucose, capillary     Status: Abnormal   Collection Time: 02/27/22  7:11 PM  Result Value Ref Range   Glucose-Capillary 266 (H) 70 - 99 mg/dL  Glucose, capillary     Status: Abnormal   Collection Time: 02/27/22  8:21 PM  Result Value Ref Range   Glucose-Capillary 206 (H) 70 - 99 mg/dL  Glucose, capillary     Status: Abnormal   Collection Time: 02/28/22 12:08 AM  Result Value Ref Range   Glucose-Capillary 145 (H) 70 - 99 mg/dL  Glucose, capillary     Status: None   Collection Time: 02/28/22  4:01 AM  Result Value Ref Range   Glucose-Capillary 95 70 - 99 mg/dL  CBC     Status: Abnormal   Collection Time: 02/28/22  5:19 AM  Result Value Ref Range   WBC 12.4 (H) 4.0 - 10.5 K/uL    RBC 1.88 (L) 3.87 - 5.11 MIL/uL   Hemoglobin 5.9 (LL) 12.0 - 15.0 g/dL   HCT 17.8 (L) 36.0 - 46.0 %   MCV 94.7 80.0 - 100.0 fL   MCH 31.4 26.0 - 34.0 pg   MCHC 33.1 30.0 - 36.0 g/dL   RDW 17.1 (H) 11.5 - 15.5 %   Platelets 123 (L) 150 - 400 K/uL   nRBC 0.0 0.0 - 0.2 %  Prepare RBC (crossmatch)     Status: None   Collection Time: 02/28/22  6:42 AM  Result Value Ref Range   Order Confirmation      ORDER PROCESSED BY BLOOD BANK Performed at [facility], [address] [place], [place] [address]    Scheduled Meds:  sodium chloride   Intravenous Once   docusate sodium  100 mg Oral Daily   fentaNYL   Intravenous Q4H   ibuprofen  600 mg Oral Q6H   insulin aspart  0-20 Units Subcutaneous Q4H   insulin aspart  8 Units Subcutaneous TID WC   insulin glargine-yfgn  36 Units Subcutaneous Daily   labetalol  200 mg Oral BID   measles, mumps & rubella vaccine  0.5 mL Subcutaneous Once   prenatal multivitamin  1 tablet Oral Q1200   scopolamine  1 patch Transdermal Once   senna-docusate  2 tablet Oral Q24H   simethicone  80 mg Oral TID PC   sodium chloride flush  3 mL Intravenous Q12H   Tdap  0.5 mL Intramuscular Once   Continuous Infusions:  sodium chloride     lactated ringers 125 mL/hr at 02/28/22 0406   naloxone HCl (NARCAN) 2 mg in dextrose 5 % 250 mL infusion     PRN Meds:.sodium chloride, acetaminophen, coconut oil, witch hazel-glycerin **AND** dibucaine, diphenhydrAMINE **OR** diphenhydrAMINE, ketorolac **OR** ketorolac, menthol-cetylpyridinium, naloxone **AND** sodium chloride flush, naloxone HCl (NARCAN) 2 mg in  dextrose 5 % 250 mL infusion, ondansetron (ZOFRAN) IV, ondansetron (ZOFRAN) IV, oxyCODONE, simethicone, sodium chloride flush, zolpidem Abdomen uterus is non tender Bandage is clean and dry   POD # 1  Doing well Routine care Diabetic coordinator has made adjustments in her insulin  Transfusion today  Patient prefers to keep pressure dressing on today  Check  cbc tomorrow

## 2022-02-28 NOTE — Progress Notes (Signed)
Hypoglycemic Event  CBG: 57@2233   Treatment: 8oz juice  Symptoms: pt states feeling "off"  Follow-up CBG: Time: 2255 CBG Result: 58  Treatment: 4oz juice  Follow-up CBG: Time: 2318 CBG Result: 72  Possible Reasons for Event: Insulin needs changing after delivery   Nicanor Bake, RN

## 2022-02-28 NOTE — Progress Notes (Signed)
Patient screened out for psychosocial assessment since none of the following apply: Psychosocial stressors documented in mother or baby's chart Gestation less than 32 weeks Code at delivery  Infant with anomalies Please contact the Clinical Social Worker if specific needs arise, by MOB's request, or if MOB scores greater than 9/yes to question 10 on Edinburgh Postpartum Depression Screen.  Abundio Miu, [place] Worker [facility] Cell#: [phone]

## 2022-02-28 NOTE — Lactation Note (Signed)
This note was copied from a baby's chart.  NICU Lactation Consultation Note  Patient Name: Jessica Kelley [id number] Date: 02/28/2022 Age:29 hours  Subjective Reason for consult: Initial assessment; Primapara; 1st time breastfeeding; Late-preterm 34-36.6wks; NICU baby; Maternal endocrine disorder  Visited with family of 83 hours old LPI NICU female, Ms. Jessica Kelley is a P1 and reports she hasn't initiated pumping yet. Explained the importance of initiating pumping shortly after birth (ideally within 6 hours) and offered to set up a DEBP but she said she wasn't ready to start pumping. Respected patient's wishes and asked to notify her RN to set her up with a DEBP whenever she's ready to start pumping. Reviewed pumping schedule, lactogenesis II and benefits of premature milk for NICU babies.  Objective Infant data: Mother's Current Feeding Choice: Breast Milk and Donor Milk  Infant feeding assessment Scale for Readiness: 3  Maternal data: G2P0111  C-Section, Low Transverse Significant Breast History:: (+) breast changes Current breast feeding challenges:: NICU admission Does the patient have breastfeeding experience prior to this delivery?: No Risk factor for low milk supply:: Primipara, prematurity, delayed pumping, infant separation, DM1 Pump:  (Left form for Stork pump to be filled out by provider, MOB has private insurance and she's elegible.)  Assessment Infant: In NICU  Maternal: N/A, no breast assessment at this time  Intervention/Plan Interventions: Breast feeding basics reviewed; Education; [employer] brochure  Plan of care: Patient will notify her RN when she's ready to start pumping Once pumping is initiated, she was encouraged to follow a 3 hour schedule, ideally 8 pumping sessions/24 hours or the closest she can get to it. Verify issuance of stork pump  No other support person at this time. All questions and concerns answered, family to contact [facility] services  PRN.  Consult Status: NICU follow-up  NICU Follow-up type: Maternal D/C visit; Verify onset of copious milk; Verify absence of engorgement   Makynlee Kressin S Lavoy Bernards 02/28/2022, 2:03 PM

## 2022-02-28 NOTE — Inpatient Diabetes Management (Addendum)
Inpatient Diabetes Program Recommendations  AACE/ADA: New Consensus Statement on Inpatient Glycemic Control (2015)  Target Ranges:  Prepandial:   less than 140 mg/dL      Peak postprandial:   less than 180 mg/dL (1-2 hours)      Critically ill patients:  140 - 180 mg/dL   Lab Results  Component Value Date   GLUCAP 95 02/28/2022   HGBA1C 7.2 (H) 02/27/2022    Review of Glycemic Control  Diabetes history: DM1; [redacted]W[redacted]D Outpatient Diabetes medications: Insulin Pump with Humalog, Dexcom CGM Current orders for Inpatient glycemic control: Insulin pump Solumedrol Qwk BMZ 12 mg 1(2) Solucortef 50 mg x 1 Decadron 4 mg x 1  Inpatient Diabetes Program Recommendations:   Consider now changing correction to Novolog 0-9 units TID & HS.    Thanks, Bronson Curb, MSN, RNC-OB Diabetes Coordinator [phone] (8a-5p)

## 2022-03-01 LAB — TYPE AND SCREEN
ABO/RH(D): O POS
Antibody Screen: NEGATIVE
Unit division: 0
Unit division: 0

## 2022-03-01 LAB — GLUCOSE, CAPILLARY
Glucose-Capillary: 89 mg/dL (ref 70–99)
Glucose-Capillary: 162 mg/dL — ABNORMAL HIGH (ref 70–99)
Glucose-Capillary: 154 mg/dL — ABNORMAL HIGH (ref 70–99)
Glucose-Capillary: 230 mg/dL — ABNORMAL HIGH (ref 70–99)
Glucose-Capillary: 180 mg/dL — ABNORMAL HIGH (ref 70–99)

## 2022-03-01 LAB — CBC
HCT: 23.4 % — ABNORMAL LOW (ref 36.0–46.0)
Hemoglobin: 7.9 g/dL — ABNORMAL LOW (ref 12.0–15.0)
MCH: 30.5 pg (ref 26.0–34.0)
MCHC: 33.8 g/dL (ref 30.0–36.0)
MCV: 90.3 fL (ref 80.0–100.0)
Platelets: 114 10*3/uL — ABNORMAL LOW (ref 150–400)
RBC: 2.59 MIL/uL — ABNORMAL LOW (ref 3.87–5.11)
RDW: 18.2 % — ABNORMAL HIGH (ref 11.5–15.5)
WBC: 7.7 10*3/uL (ref 4.0–10.5)
nRBC: 0.5 % — ABNORMAL HIGH (ref 0.0–0.2)

## 2022-03-01 LAB — BPAM RBC
Blood Product Expiration Date: 202310312359
Blood Product Expiration Date: 202311012359
ISSUE DATE / TIME: 202310120911
ISSUE DATE / TIME: 202310121148
Unit Type and Rh: 5100
Unit Type and Rh: 5100

## 2022-03-01 LAB — SURGICAL PATHOLOGY

## 2022-03-01 MED ORDER — HYDRALAZINE HCL 20 MG/ML IJ SOLN: 10.0000 mg | INTRAMUSCULAR | Status: DC | PRN

## 2022-03-01 MED ORDER — LABETALOL HCL 5 MG/ML IV SOLN: 40.0000 mg | INTRAVENOUS | Status: DC | PRN

## 2022-03-01 MED ORDER — LABETALOL HCL 5 MG/ML IV SOLN
40.0000 mg | INTRAVENOUS | Status: DC | PRN
  Filled 2022-03-01: qty 8

## 2022-03-01 MED ORDER — MAGNESIUM SULFATE 40 GM/1000ML IV SOLN
2.0000 g/h | INTRAVENOUS | Status: AC
  Administered 2022-03-02: 2 g/h via INTRAVENOUS
  Filled 2022-03-01 (×2): qty 1000

## 2022-03-01 MED ORDER — INSULIN GLARGINE-YFGN 100 UNIT/ML ~~LOC~~ SOLN
30.0000 [IU] | Freq: Every day | SUBCUTANEOUS | Status: DC
  Administered 2022-03-01 – 2022-03-02 (×2): 30 [IU] via SUBCUTANEOUS
  Filled 2022-03-01 (×2): qty 0.3

## 2022-03-01 MED ORDER — LACTATED RINGERS IV SOLN: INTRAVENOUS | Status: DC

## 2022-03-01 MED ORDER — LABETALOL HCL 5 MG/ML IV SOLN: 20.0000 mg | INTRAVENOUS | Status: DC | PRN

## 2022-03-01 MED ORDER — LABETALOL HCL 5 MG/ML IV SOLN: 80.0000 mg | INTRAVENOUS | Status: DC | PRN

## 2022-03-01 MED ORDER — LABETALOL HCL 5 MG/ML IV SOLN
20.0000 mg | INTRAVENOUS | Status: DC | PRN
  Administered 2022-03-01: 20 mg via INTRAVENOUS
  Filled 2022-03-01: qty 4

## 2022-03-01 MED ORDER — ENOXAPARIN SODIUM 40 MG/0.4ML IJ SOSY
40.0000 mg | PREFILLED_SYRINGE | INTRAMUSCULAR | Status: DC
  Administered 2022-03-01 – 2022-03-05 (×5): 40 mg via SUBCUTANEOUS
  Filled 2022-03-01 (×5): qty 0.4

## 2022-03-01 MED ORDER — LABETALOL HCL 200 MG PO TABS: 200.0000 mg | ORAL_TABLET | Freq: Three times a day (TID) | ORAL | Status: DC

## 2022-03-01 MED ORDER — MAGNESIUM SULFATE BOLUS VIA INFUSION
4.0000 g | Freq: Once | INTRAVENOUS | Status: AC
  Administered 2022-03-01: 4 g via INTRAVENOUS
  Filled 2022-03-01: qty 1000

## 2022-03-01 MED ORDER — INSULIN ASPART 100 UNIT/ML IJ SOLN
4.0000 [IU] | Freq: Three times a day (TID) | INTRAMUSCULAR | Status: DC
  Administered 2022-03-01 – 2022-03-04 (×7): 4 [IU] via SUBCUTANEOUS

## 2022-03-01 NOTE — Inpatient Diabetes Management (Signed)
Inpatient Diabetes Program Recommendations  AACE/ADA: New Consensus Statement on Inpatient Glycemic Control (2015)  Target Ranges:  Prepandial:   less than 140 mg/dL      Peak postprandial:   less than 180 mg/dL (1-2 hours)      Critically ill patients:  140 - 180 mg/dL   Lab Results  Component Value Date   GLUCAP 89 03/01/2022   HGBA1C 7.2 (H) 02/27/2022    Review of Glycemic Control  Latest Reference Range & Units 02/28/22 19:31 02/28/22 22:33 02/28/22 22:55 02/28/22 23:18 03/01/22 05:14  Glucose-Capillary 70 - 99 mg/dL 125 (H) 57 (L) 58 (L) 72 89  (H): Data is abnormally high (L): Data is abnormally low Diabetes history: DM1; [redacted]W[redacted]D Outpatient Diabetes medications: Insulin Pump with Humalog, Dexcom CGM Current orders for Inpatient glycemic control: Novolog 0-9 units TID & HS, Semglee 36 units QD, Novolog 8 units TID Solumedrol Qwk BMZ 12 mg 1(2) Solucortef 50 mg x 1 Decadron 4 mg x 1   Inpatient Diabetes Program Recommendations:   Noted hypoglycemia of 57 mg/dL.  Consider changing: -Semglee to 30 units QD -Novolog 4 units TID (Assuming patient is consuming >50% of meals)  Thanks, Bronson Curb, MSN, RNC-OB Diabetes Coordinator [phone] (8a-5p)

## 2022-03-01 NOTE — Progress Notes (Signed)
At bedside to check on patient for elevated blood pressure postpartum   Blood pressure was elevated to the severe range and persistent on recheck.  BP was responsive to 20 mg IV labetalol to mild range of 149/84.   She denies headache but does report vision changes in her right eye.  Her vision in her left eye is diminished at baseline due to history of glaucoma. There is no change in her left eye.  She describes a "floater" that moves and can't be wiped away. She denies any ocular pain.  She has no change in sensation or weakness otherwise. No other apparent neural symptoms. Overall she has felt better throughout the day.  Vitals:   03/01/22 2019 03/01/22 2109  BP: (!) 168/87 (!) 149/84  Pulse: (!) 103 98  Resp:    Temp:    SpO2:      Consult placed to neurology, and spoke with on-call provider regarding her symptom in the setting of her comorbidities. 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  Neuro also recommends MRI of brain to evaluate for intracranial process.    Will initiate PP magnesium.  Stat MRI Brain w/ and w/o contrast ordered.  Will also continue with close BP control per protocol, consider increase of PO labetalol from BID to TID.   Patient updated at bedside with plan.   Alpha Gula MD

## 2022-03-01 NOTE — Progress Notes (Signed)
Postpartum Progress Note  Postpartum Day 2 s/p primary Cesarean section.  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  Subjective:  Patient reports no overnight events.  She reports well controlled pain, ambulating without difficulty, voiding spontaneously, tolerating PO.  She reports Positive flatus, Negative BM.  Vaginal bleeding is minimal.  Objective: Blood pressure 125/62, pulse 92, temperature 97.9 F (36.6 C), temperature source Oral, resp. rate 19, height 5' 5"  (1.651 m), weight 85.3 kg, last menstrual period 07/05/2021, SpO2 98 %, unknown if currently breastfeeding.  Physical Exam:  General: alert and no distress Lochia: appropriate Uterine Fundus: firm Incision: dressing in place DVT Evaluation: No evidence of DVT seen on physical exam.  Recent Labs    02/28/22 0519 03/01/22 0518  HGB 5.9* 7.9*  HCT 17.8* 23.4*    Assessment/Plan: Postpartum Day 2, s/p C-section Acute blood loss anemia - HGB 5.9 > 2u pRBCs > 7.9 this AM. Fe/Colace. No signs or symptoms of anemia.  Lactation following cHTN: normal range with rare mild range elevation Continue labetalol 200 mg BID T1DM:   Appreciate DM coordinator involvement. Control is in the setting of betamethasone, solucortef, and decadron Low BG last night at 57, now improved. Insulin pump in place Chronic inflammatory demyelinating polyradiculopathy  Neurology curbside consult prior to delivery, will reach out with any new neurologic symptoms, otherwise plan for close outpatient follow up.  Solumedrol (last 10/11) weekly and IVIG (last 10/10) weekly after delivery Patient will reach out to neurologist today to inform of delivery. DVT Ppx: Heparin held perioperatively, will resume today and for 6 weeks postpartum with Lovenox Doing well, continue routine postpartum care.   LOS: 4 days   Carlyon Shadow 03/01/2022, 7:28 AM

## 2022-03-01 NOTE — Lactation Note (Signed)
This note was copied from a baby's chart.  NICU Lactation Consultation Note  Patient Name: Jessica Kelley [id number] Date: 03/01/2022 Age:31   Subjective Reason for consult: Follow-up assessment Mother has not initiated pumping. She relays that she is "uncomfortable but willing" to pump. I offered to address any questions/concerns and to assist prn. Mother politely declined.   Mother is aware of stork pump process. She has not yet decided on interest in receipt of stork pump. She is aware of [place] assistance in process prn.   Objective Infant data: Mother's Current Feeding Choice: Breast Milk and Donor Milk  Infant feeding assessment Scale for Readiness: 3  Maternal data: G2P0111  C-Section, Low Transverse Significant Breast History:: (+) breast changes  Current breast feeding challenges:: NICU admission  Does the patient have breastfeeding experience prior to this delivery?: No  Risk factor for low milk supply:: no initiation of pumping/breastfeeding in first 48 hours   Pump:  (Left form for Stork pump to be filled out by provider, MOB has private insurance and she's elegible.)   Intervention/Plan Interventions: Education  No data recorded Plan: Consult Status: NICU follow-up  NICU Follow-up type: Maternal D/C visit; Verify onset of copious milk; Verify absence of engorgement; Weekly NICU follow up    [name] 03/01/2022, 9:40 AM

## 2022-03-02 ENCOUNTER — Inpatient Hospital Stay (HOSPITAL_COMMUNITY): Payer: BC Managed Care – PPO

## 2022-03-02 LAB — COMPREHENSIVE METABOLIC PANEL
ALT: 19 U/L (ref 0–44)
AST: 35 U/L (ref 15–41)
Albumin: 2 g/dL — ABNORMAL LOW (ref 3.5–5.0)
Alkaline Phosphatase: 84 U/L (ref 38–126)
Anion gap: 5 (ref 5–15)
BUN: 9 mg/dL (ref 6–20)
CO2: 27 mmol/L (ref 22–32)
Calcium: 8.2 mg/dL — ABNORMAL LOW (ref 8.9–10.3)
Chloride: 101 mmol/L (ref 98–111)
Creatinine, Ser: 0.55 mg/dL (ref 0.44–1.00)
GFR, Estimated: >60 mL/min (ref >60)
Glucose, Bld: 87 mg/dL (ref 70–99)
Potassium: 3.4 mmol/L — ABNORMAL LOW (ref 3.5–5.1)
Sodium: 133 mmol/L — ABNORMAL LOW (ref 135–145)
Total Bilirubin: 0.3 mg/dL (ref 0.3–1.2)
Total Protein: 6.1 g/dL — ABNORMAL LOW (ref 6.5–8.1)

## 2022-03-02 LAB — MAGNESIUM: Magnesium: 4.2 mg/dL — ABNORMAL HIGH (ref 1.7–2.4)

## 2022-03-02 LAB — GLUCOSE, CAPILLARY
Glucose-Capillary: 58 mg/dL — ABNORMAL LOW (ref 70–99)
Glucose-Capillary: 90 mg/dL (ref 70–99)
Glucose-Capillary: 231 mg/dL — ABNORMAL HIGH (ref 70–99)
Glucose-Capillary: 268 mg/dL — ABNORMAL HIGH (ref 70–99)
Glucose-Capillary: 290 mg/dL — ABNORMAL HIGH (ref 70–99)
Glucose-Capillary: 236 mg/dL — ABNORMAL HIGH (ref 70–99)

## 2022-03-02 LAB — CBC
HCT: 25.8 % — ABNORMAL LOW (ref 36.0–46.0)
Hemoglobin: 8.5 g/dL — ABNORMAL LOW (ref 12.0–15.0)
MCH: 30.2 pg (ref 26.0–34.0)
MCHC: 32.9 g/dL (ref 30.0–36.0)
MCV: 91.8 fL (ref 80.0–100.0)
Platelets: 137 10*3/uL — ABNORMAL LOW (ref 150–400)
RBC: 2.81 MIL/uL — ABNORMAL LOW (ref 3.87–5.11)
RDW: 17.3 % — ABNORMAL HIGH (ref 11.5–15.5)
WBC: 7.3 10*3/uL (ref 4.0–10.5)
nRBC: 0 % (ref 0.0–0.2)

## 2022-03-02 MED ORDER — LABETALOL HCL 200 MG PO TABS
300.0000 mg | ORAL_TABLET | Freq: Three times a day (TID) | ORAL | Status: DC
  Administered 2022-03-02 – 2022-03-05 (×10): 300 mg via ORAL
  Filled 2022-03-02 (×10): qty 1

## 2022-03-02 MED ORDER — GADOBUTROL 1 MMOL/ML IV SOLN
8.0000 mL | Freq: Once | INTRAVENOUS | Status: AC | PRN
  Administered 2022-03-02: 8 mL via INTRAVENOUS

## 2022-03-02 MED ORDER — INSULIN GLARGINE-YFGN 100 UNIT/ML ~~LOC~~ SOLN
28.0000 [IU] | Freq: Every day | SUBCUTANEOUS | Status: DC
  Administered 2022-03-03: 28 [IU] via SUBCUTANEOUS
  Filled 2022-03-02 (×2): qty 0.28

## 2022-03-02 NOTE — Inpatient Diabetes Management (Signed)
Inpatient Diabetes Program Recommendations  AACE/ADA: New Consensus Statement on Inpatient Glycemic Control (2015)  Target Ranges:  Prepandial:   less than 140 mg/dL      Peak postprandial:   less than 180 mg/dL (1-2 hours)      Critically ill patients:  140 - 180 mg/dL   Lab Results  Component Value Date   GLUCAP 90 03/02/2022   HGBA1C 7.2 (H) 02/27/2022    Review of Glycemic Control  Had another low this am of 58 mg/dL. Current orders for Inpatient glycemic control: Semglee 30 QD, Novolog 0-9 units TID with meals and 0-5 HS + 4 units TID  Steroids discontinued.  Inpatient Diabetes Program Recommendations:    Consider decreasing Semglee to 28 units QD  Will continue to follow.  Thank you. Lorenda Peck, RD, LDN, [place] Inpatient Diabetes Coordinator [phone]

## 2022-03-02 NOTE — Inpatient Diabetes Management (Signed)
Hypoglycemic Event  CBG: 58  Treatment: 4 oz juice/soda  Symptoms:  reported feeling weak  Follow-up CBG: Time:624 CBG Result:90  Possible Reasons for Event: Inadequate meal intake  Comments/MD notified:   Charlynne Cousins Sary Bogie

## 2022-03-02 NOTE — Progress Notes (Addendum)
Postpartum Progress Note  Postpartum Day 3 s/p primary Cesarean section.  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  Subjective:  Yesterday patient had elevated blood pressure and vision changes.  She was begun on magnesium infusion and underwent MRI brain per Neuro recs.    She is tolerating magnesium well. She denies headache, but still reports floaters.   She reports well controlled pain, ambulating without difficulty, voiding spontaneously, tolerating PO.  She reports Positive flatus, Negative BM.  Vaginal bleeding is minimal.  Objective: Blood pressure (!) 142/80, pulse 86, temperature 98.5 F (36.9 C), temperature source Oral, resp. rate 17, height 5' 5"  (1.651 m), weight 85.3 kg, last menstrual period 07/05/2021, SpO2 97 %, unknown if currently breastfeeding.  Physical Exam:  General: alert and no distress Lochia: appropriate Uterine Fundus: firm Incision: dressing in place DVT Evaluation: No evidence of DVT seen on physical exam.  Recent Labs    03/01/22 0518 03/02/22 0422  HGB 7.9* 8.5*  HCT 23.4* 25.8*     Assessment/Plan: Postpartum Day 3, s/p C-section Acute blood loss anemia - HGB 5.9 > 2u pRBCs > 7.9 >8.5 this AM. No signs or symptoms of anemia.  Lactation following cHTN:  Labetalol 200 mg BID increased to 300 mg TID Now on postpartum magnesium for concern for superimposed preeclampsia.  Continue for 24 hrs. HELLP labs this AM reassuring.  T1DM:   Appreciate DM coordinator involvement. Control is in the setting of betamethasone, solucortef, and decadron Insulin pump available but not in use Chronic inflammatory demyelinating polyradiculopathy  Plan for close outpatient follow up.  Solumedrol (last 10/11) weekly and IVIG (last 10/10) weekly after delivery MRI brain for vision changes was WNL. Appreciate neuro guidance. DVT Ppx: Lovenox for 6 weeks postpartum Doing well, continue routine postpartum  care.   LOS: 5 days   Carlyon Shadow 03/02/2022, 7:05 AM

## 2022-03-03 LAB — GLUCOSE, CAPILLARY
Glucose-Capillary: 64 mg/dL — ABNORMAL LOW (ref 70–99)
Glucose-Capillary: 77 mg/dL (ref 70–99)
Glucose-Capillary: 111 mg/dL — ABNORMAL HIGH (ref 70–99)
Glucose-Capillary: 318 mg/dL — ABNORMAL HIGH (ref 70–99)
Glucose-Capillary: 235 mg/dL — ABNORMAL HIGH (ref 70–99)
Glucose-Capillary: 227 mg/dL — ABNORMAL HIGH (ref 70–99)
Glucose-Capillary: 226 mg/dL — ABNORMAL HIGH (ref 70–99)

## 2022-03-03 MED ORDER — IBUPROFEN 200 MG PO TABS: 400.0000 mg | ORAL_TABLET | Freq: Four times a day (QID) | ORAL | 0 refills | Status: AC | PRN

## 2022-03-03 MED ORDER — ACETAMINOPHEN 325 MG PO TABS: 650.0000 mg | ORAL_TABLET | ORAL | 0 refills | Status: AC | PRN

## 2022-03-03 MED ORDER — LABETALOL HCL 300 MG PO TABS: 300.0000 mg | ORAL_TABLET | Freq: Three times a day (TID) | ORAL | 0 refills | Status: AC

## 2022-03-03 MED ORDER — OXYCODONE HCL 5 MG PO TABS: 5.0000 mg | ORAL_TABLET | ORAL | 0 refills | Status: AC | PRN

## 2022-03-03 MED ORDER — ENOXAPARIN SODIUM 40 MG/0.4ML IJ SOSY: 40.0000 mg | PREFILLED_SYRINGE | INTRAMUSCULAR | 0 refills | Status: DC

## 2022-03-03 MED ORDER — DOCUSATE SODIUM 100 MG PO CAPS: 100.0000 mg | ORAL_CAPSULE | Freq: Every day | ORAL | 0 refills | Status: DC

## 2022-03-03 NOTE — Lactation Note (Signed)
This note was copied from a baby's chart.  NICU Lactation Consultation Note  Patient Name: Jessica Kelley [id number] Date: 03/03/2022 Age:29 days  Subjective Reason for consult: Follow-up assessment; 1st time breastfeeding; Primapara; Maternal endocrine disorder; NICU baby; Late-preterm 34-36.6wks; Infant < 6lbs  Visited with family of 29 hours old LPI NICU female, Jessica Kelley is a P1 and has decided she's not going to pump, she didn't initiated pumping while at the hospital because it was just too stressful, she's dealing with her own pre-existing medical conditions (DM1, auto-immune disorder) plus she also mentioned that baby required a special type of formula because he didn't tolerate donor milk very well, he's currently on Elecare. Reviewed engorgement prevention/treatment and anticipatory guidelines. All questions and concerns answered, [name] services are completed at this time.  Objective Infant data: Mother's Current Feeding Choice: Formula  Infant feeding assessment Scale for Readiness: 2  Maternal data: G2P0111  C-Section, Low Transverse Pump:  (Left form for Stork pump to be filled out by provider, MOB has private insurance and she's elegible.)  Assessment Infant: In NICU  Maternal: Breast are still soft, no S/S of engorgement at this time  Intervention/Plan Interventions: Education  Plan: Consult Status: Complete   Jessica Kelley S Jessica Kelley 03/03/2022, 11:09 AM

## 2022-03-03 NOTE — Progress Notes (Signed)
At bedside to check on patient.  Today is the best she has felt since surgery, has been more mobile and been to NICU twice this morning.  Continues to deny headache, small floater still noted in right periphery.  Blood glucose this morning improved, but with spike to 318 after more PO intake.  Discussed plan for transition back to insulin pump with Colletta [place], DM coordinator.  Given basal insulin was received this AM, plan for insulin pump use tomorrow AM.  All questions answered.   Alpha Gula

## 2022-03-03 NOTE — Inpatient Diabetes Management (Signed)
Inpatient Diabetes Program Recommendations  AACE/ADA: New Consensus Statement on Inpatient Glycemic Control (2015)  Target Ranges:  Prepandial:   less than 140 mg/dL      Peak postprandial:   less than 180 mg/dL (1-2 hours)      Critically ill patients:  140 - 180 mg/dL   Lab Results  Component Value Date   GLUCAP 318 (H) 03/03/2022   HGBA1C 7.2 (H) 02/27/2022    Review of Glycemic Control  CBGs this am: 111, 318. Had low of 64 mg/dL at 0500. Semglee was reduced to 28 units QD   Current orders for Inpatient glycemic control: Semglee 28 units QD, Novolog 0-9 units TID with meals and 0-5 HS + 4 units TID  Inpatient Diabetes Program Recommendations:    D/C all insulin orders.  Begin Insulin Pump Order Set  Do not restart basal rate until 24H after Semglee was given.   Discussed with Nelida Gores. Instructed her to use hospital meter for CBG checks TID with meals and 0200.  Use flowsheet for assessment every shift.  Will have contract signed by patient.  Thank you. Lorenda Peck, RD, LDN, [place] Inpatient Diabetes Coordinator [phone]

## 2022-03-03 NOTE — Progress Notes (Signed)
Postpartum Progress Note  Postpartum Day 4 s/p primary Cesarean section.  Pregnancy is complicated by Y1EH, chronic HTN, and chronic demyelinating polyradiculopathy.  Subjective:  She is now s/p 24 hrs of postpartum magnesium infusion.  She reports feeling improved since magnesium off.  Denies headache, unsure at this time if floaters have resolved.   She reports well controlled pain, ambulating with assistance, voiding spontaneously, tolerating PO.  She reports Positive flatus, Negative BM.  Vaginal bleeding is minimal.  Objective: Blood pressure 125/77, pulse 93, temperature 97.8 F (36.6 C), temperature source Oral, resp. rate 16, height 5' 5"  (1.651 m), weight 85.3 kg, last menstrual period 07/05/2021, SpO2 99 %, unknown if currently breastfeeding.  Physical Exam:  General: alert and no distress Lochia: appropriate Uterine Fundus: firm Incision: dressing in place DVT Evaluation: No evidence of DVT seen on physical exam.  Recent Labs    03/01/22 0518 03/02/22 0422  HGB 7.9* 8.5*  HCT 23.4* 25.8*     Assessment/Plan: Postpartum Day 4, s/p C-section Acute blood loss anemia - HGB 5.9 > 2u pRBCs > 7.9 >8.5 this AM. No signs or symptoms of anemia.  Lactation following cHTN:  Labetalol 200 mg BID increased to 300 mg TID yesterday - BP improved.  125/77 this AM.  S/p PP magnesium with normal PIH labs. T1DM:   Appreciate DM coordinator involvement. Control is in the setting of betamethasone, solucortef, and decadron Will continue to make daily adjustments Insulin pump available but not in use, will work towards transitioning  Chronic inflammatory demyelinating polyradiculopathy  Plan for close outpatient follow up.  Solumedrol (last 10/11) weekly and IVIG (last 10/10) weekly after delivery Patient has declined PT in house but will continue to offer MRI brain for vision changes was WNL. Appreciate neuro guidance. DVT Ppx: Lovenox for 6 weeks postpartum  Discharge planning:    Blood pressure control is improved.  Glucoregulation continues to be challenging, and patient will still need to be transitioned to her insulin pump prior to discharge.  Discussed importance for postop healing to have adequate glycemic control.    LOS: 6 days   Carlyon Shadow 03/03/2022, 6:58 AM

## 2022-03-03 NOTE — Progress Notes (Signed)
Hypoglycemic Event 03/03/22 0506  CBG: 64  Treatment: 4 oz juice/soda  Symptoms: None  Follow-up CBG: Time: 0526 CBG Result: 77  Possible Reasons for Event: Inadequate meal intake  Comments/MD notified:    Eustace Moore

## 2022-03-04 ENCOUNTER — Ambulatory Visit: Payer: BC Managed Care – PPO

## 2022-03-04 LAB — GLUCOSE, CAPILLARY
Glucose-Capillary: 248 mg/dL — ABNORMAL HIGH (ref 70–99)
Glucose-Capillary: 210 mg/dL — ABNORMAL HIGH (ref 70–99)
Glucose-Capillary: 61 mg/dL — ABNORMAL LOW (ref 70–99)
Glucose-Capillary: 64 mg/dL — ABNORMAL LOW (ref 70–99)
Glucose-Capillary: 86 mg/dL (ref 70–99)
Glucose-Capillary: 116 mg/dL — ABNORMAL HIGH (ref 70–99)
Glucose-Capillary: 108 mg/dL — ABNORMAL HIGH (ref 70–99)

## 2022-03-04 MED ORDER — INSULIN PUMP
Freq: Three times a day (TID) | SUBCUTANEOUS | Status: DC
  Administered 2022-03-05: 4 via SUBCUTANEOUS
  Filled 2022-03-04: qty 1

## 2022-03-04 NOTE — Inpatient Diabetes Management (Signed)
Inpatient Diabetes Program Recommendations  AACE/ADA: New Consensus Statement on Inpatient Glycemic Control (2015)  Target Ranges:  Prepandial:   less than 140 mg/dL      Peak postprandial:   less than 180 mg/dL (1-2 hours)      Critically ill patients:  140 - 180 mg/dL   Lab Results  Component Value Date   GLUCAP 248 (H) 03/04/2022   HGBA1C 7.2 (H) 02/27/2022    Diabetes history: DM1 Outpatient Diabetes medications: Insulin Pump with Humalog, Dexcom CGM Current orders for Inpatient glycemic control: Novolog 0-9 units TID & HS, Semglee 28 units QD, Novolog 4 units TID    Inpatient Diabetes Program Recommendations:    Consider adding insulin pump order set.   Spoke with patient at bedside. Patient applied insulin pump and currently infusing using rates below. Discussed with patient that she may need a reduction to rates following midnight. Patient is planning to make appointment with endocrinologist as soon as possible.    Normal Profile Basal insulin 12A      1.3 units/hour 3A        1.6 units/hour 8A        1.5 units/hour 12P      1.5 units/hour 6P        1.5 units/hour   Total daily basal insulin: 35.9 units/24 hours   Carb Coverage 12A     1:5        1 unit for every 5 grams of carbohydrates 3A       1:5        1 unit for every 5 grams of carbohydrates 8A       1:4.5     1 unit for every 4.5 grams of carbohydrates 12P     1:5.5     1 unit for every 5.5 grams of carbohydrates 6P       1:6        1 unit for every 6 grams of carbohydrates   Insulin Sensitivity 12A      1:35     1 unit drops blood glucose 35 mg/dl 3A        1:35     1 unit drops blood glucose 35 mg/dl 8A        1:30     1 unit drops blood glucose 30 mg/dl 12P      1:30     1 unit drops blood glucose 30 mg/dl 6P        1:30     1 unit drops blood glucose 30 mg/dl   Target Glucose Goals 12A      110 mg/dl  No further questions at this time. Secure chat sent to MD. Discussed plan of care with RN.    Thanks, Bronson Curb, MSN, RNC-OB Diabetes Coordinator [phone] (8a-5p)

## 2022-03-04 NOTE — Progress Notes (Signed)
Hypoglycemic Event  CBG: 61  Treatment: 4 oz apple juice, 1 pack of graham crackers Symptoms: shaky, weak  Follow-up CBG: Time: 1657 CBG Result: 64 Treatment: 4 oz apple juice, 1 pack of fruit gushers  Follow-up CBG: Time: 1717 CBG Result: 86  Possible Reasons for Event: unknown  Comments/MD notified: Notified Dr. Lynnette Caffey; will continue to observe pt overnight.    Letitia Neri

## 2022-03-04 NOTE — Progress Notes (Signed)
Subjective: Postpartum Day 5: Cesarean Delivery Patient reports tolerating PO, + flatus, and no problems voiding.  No HA, vision change, RUQ pain, CP/SOB.  Able to go to the bathroom independently at this time.  Feeling better.  In NICU holding newborn for the first time.  Objective: Vital signs in last 24 hours: Temp:  [98.2 F (36.8 C)-98.5 F (36.9 C)] 98.4 F (36.9 C) (10/16 0909) Pulse Rate:  [90-103] 103 (10/16 0909) Resp:  [16-17] 17 (10/16 0909) BP: (119-146)/(70-82) 134/81 (10/16 0909) SpO2:  [96 %-99 %] 97 % (10/16 0909)  Physical Exam:  General: alert, cooperative, and appears stated age 31: appropriate Uterine Fundus: firm Incision: healing well, no significant drainage, honeycomb c/d/i DVT Evaluation: No evidence of DVT seen on physical exam. Negative Homan's sign. No cords or calf tenderness.  Recent Labs    03/02/22 0422  HGB 8.5*  HCT 25.8*   CBG (last 3)  Recent Labs    03/03/22 1818 03/03/22 2253 03/04/22 0602  GLUCAP 227* 226* 248*    Assessment/Plan: Postpartum Day 5, s/p C-section Acute blood loss anemia - HGB 5.9 > 2u pRBCs > 7.9 >8.5. No signs or symptoms of anemia.  Lactation following cHTN:  Labetalol 300 mg TID  S/p PP magnesium with normal PIH labs. T1DM:   Appreciate DM coordinator involvement. Control is in the setting of betamethasone, solucortef, and decadron Will continue to make daily adjustments Insulin pump placed this AM   Chronic inflammatory demyelinating polyradiculopathy  Plan for close outpatient follow up.  Solumedrol (last 10/11) weekly and IVIG (last 10/10) weekly after delivery Patient has declined PT in house but will continue to offer MRI brain for vision changes was WNL. Appreciate neuro guidance. DVT Ppx: Lovenox for 6 weeks postpartum   Discharge planning:  Blood pressure control is improved.  Glucoregulation continues to be challenging although pump is now in place.  Will reassess this PM and if  improved control, will consider d/c home with close endo f/u.      Linda Hedges, DO 03/04/2022, 11:52 AM

## 2022-03-05 LAB — GLUCOSE, CAPILLARY
Glucose-Capillary: 136 mg/dL — ABNORMAL HIGH (ref 70–99)
Glucose-Capillary: 118 mg/dL — ABNORMAL HIGH (ref 70–99)

## 2022-03-05 NOTE — Discharge Summary (Signed)
Postpartum Discharge Summary  Date of Service March 05, 2022     Patient Name: Jessica Kelley DOB: [birth year] MRN: [id number]  Date of admission: 02/25/2022 Delivery date:02/27/2022  Delivering provider: Louretta Shorten  Date of discharge: 03/05/2022  Admitting diagnosis: Fetal growth restriction antepartum [O36.5990] Intrauterine pregnancy: [redacted]w[redacted]d    Secondary diagnosis:  Principal Problem:   Fetal growth restriction antepartum  Additional problems: none    Discharge diagnosis: CHTN and Type 2 DM                                              Post partum procedures: blood transfusion Augmentation: N/A Complications: [id number] Hospital course: Sceduled C/S   31y.o. yo G2P0111 at 3105w6das admitted to the hospital 02/25/2022 for scheduled cesarean section with the following indication: IUGR .Delivery details are as follows:  Membrane Rupture Time/Date: 12:10 PM ,02/27/2022   Delivery Method:C-Section, Low Transverse  Details of operation can be found in separate operative note.  Patient had a postpartum course complicated byanemia requiring a blood transfusion and blood sugar control issues .  She is ambulating, tolerating a regular diet, passing flatus, and urinating well. Patient is discharged home in stable condition on  03/05/22        Newborn Data: Birth date:02/27/2022  Birth time:12:10 PM  Gender:Female  Living status:Living  Apgars:7 ,9  Weight:2190 g     Magnesium Sulfate received: No BMZ received: No Rhophylac:N/A MMR:N/A T-DaP:Given prenatally Flu: N/A Transfusion:Yes  Physical exam  Vitals:   03/04/22 1900 03/04/22 2229 03/05/22 0213 03/05/22 0846  BP: 121/73 128/71 108/63 133/75  Pulse: (!) 103 (!) 105 94 (!) 107  Resp: _0 Temp: 99.2 F (37.3 C) 98.8 F (37.1 C) 98 F (36.7 C) 98.1 F (36.7 C)  TempSrc: Oral Oral Oral Oral  SpO2: 98% 97% 98% 95%  Weight:      Height:       General: alert, cooperative, and no  distress Lochia: appropriate Uterine Fundus: firm Incision: Healing well with no significant drainage DVT Evaluation: No evidence of DVT seen on physical exam. Labs: Lab Results  Component Value Date   WBC 7.3 03/02/2022   HGB 8.5 (L) 03/02/2022   HCT 25.8 (L) 03/02/2022   MCV 91.8 03/02/2022   PLT 137 (L) 03/02/2022      Latest Ref Rng & Units 03/02/2022    4:22 AM  CMP  Glucose 70 - 99 mg/dL 87   BUN 6 - 20 mg/dL 9   Creatinine 0.44 - 1.00 mg/dL 0.55   Sodium 135 - 145 mmol/L 133   Potassium 3.5 - 5.1 mmol/L 3.4   Chloride 98 - 111 mmol/L 101   CO2 22 - 32 mmol/L 27   Calcium 8.9 - 10.3 mg/dL 8.2   Total Protein 6.5 - 8.1 g/dL 6.1   Total Bilirubin 0.3 - 1.2 mg/dL 0.3   Alkaline Phos 38 - 126 U/L 84   AST 15 - 41 U/L 35   ALT 0 - 44 U/L 19    Edinburgh Score:    02/28/2022   12:46 PM  Edinburgh Postnatal Depression Scale Screening Tool  I have been able to laugh and see the funny side of things. 0  I have looked forward with enjoyment to things. 0  I have blamed myself unnecessarily when things  went wrong. 1  I have been anxious or worried for no good reason. 1  I have felt scared or panicky for no good reason. 0  Things have been getting on top of me. 0  I have been so unhappy that I have had difficulty sleeping. 0  I have felt sad or miserable. 0  I have been so unhappy that I have been crying. 0  The thought of harming myself has occurred to me. 0  Edinburgh Postnatal Depression Scale Total 2      After visit meds:  Allergies as of 03/05/2022       Reactions   Ginger Rash        Medication List     STOP taking these medications    Baqsimi Two Pack 3 MG/DOSE Powd Generic drug: Glucagon   Dexcom G6 Sensor Misc   Dexcom G6 Transmitter Misc   insulin lispro 100 UNIT/ML injection Commonly known as: HumaLOG   insulin lispro 100 UNIT/ML KwikPen Commonly known as: HumaLOG KwikPen   methylPREDNISolone sodium succinate 1000 MG  injection Commonly known as: SOLU-MEDROL   NovoLOG FlexPen 100 UNIT/ML FlexPen Generic drug: insulin aspart   NovoLOG ReliOn 100 UNIT/ML injection Generic drug: insulin aspart   prenatal multivitamin Tabs tablet       TAKE these medications    acetaminophen 325 MG tablet Commonly known as: TYLENOL Take 2 tablets (650 mg total) by mouth every 4 (four) hours as needed for mild pain (temperature > 101.5.).   docusate sodium 100 MG capsule Commonly known as: COLACE Take 1 capsule (100 mg total) by mouth daily.   enoxaparin 40 MG/0.4ML injection Commonly known as: LOVENOX Inject 0.4 mLs (40 mg total) into the skin daily.   ibuprofen 200 MG tablet Commonly known as: Motrin IB Take 2 tablets (400 mg total) by mouth every 6 (six) hours as needed.   immune globulin (human) 10 g injection Commonly known as: GAMMAGARD S/D LESS IGA Inject into the vein.   labetalol 300 MG tablet Commonly known as: NORMODYNE Take 1 tablet (300 mg total) by mouth 3 (three) times daily. What changed:  medication strength how much to take when to take this   oxyCODONE 5 MG immediate release tablet Commonly known as: Oxy IR/ROXICODONE Take 1 tablet (5 mg total) by mouth every 4 (four) hours as needed for up to 5 days for moderate pain.         Discharge home in stable condition Infant Feeding:  nicu Infant Disposition:NICU Discharge instruction: per After Visit Summary and Postpartum booklet. Activity: Advance as tolerated. Pelvic rest for 6 weeks.  Diet: carb modified diet Anticipated Birth Control: BTL done PP and Unsure Postpartum Appointment:1 week Additional Postpartum F/U: Incision check 1 week and BP check 1 week Future Appointments:No future appointments. Follow up Visit:      03/05/2022 Cyril Mourning, MD

## 2022-03-05 NOTE — Progress Notes (Signed)
Discharge instructions and prescriptions given to pt. Discussed post c-section care, signs and symptoms to report to the MD, upcoming appointments, and meds. Pt verbalizes understanding and has no questions or concerns at this time. Pt discharged home from hospital in stable condition.

## 2022-03-08 ENCOUNTER — Telehealth (HOSPITAL_COMMUNITY): Payer: Self-pay

## 2022-03-08 NOTE — Telephone Encounter (Signed)
Patient did not answer phone call. Voicemail left for patient.   Sharyn Lull [facility] 03/08/22,1837

## 2022-03-11 ENCOUNTER — Ambulatory Visit: Payer: BC Managed Care – PPO

## 2022-03-30 ENCOUNTER — Other Ambulatory Visit: Payer: Self-pay

## 2022-03-30 ENCOUNTER — Encounter (HOSPITAL_COMMUNITY): Payer: Self-pay | Admitting: Obstetrics and Gynecology

## 2022-03-30 ENCOUNTER — Inpatient Hospital Stay (HOSPITAL_COMMUNITY)
Admission: AD | Admit: 2022-03-30 | Discharge: 2022-03-30 | Disposition: A | Discharge status: Home / Self Care | Payer: BC Managed Care – PPO | Attending: Obstetrics and Gynecology | Admitting: Obstetrics and Gynecology

## 2022-03-30 DIAGNOSIS — Z20822 Contact with and (suspected) exposure to covid-19: Secondary | ICD-10-CM | POA: Diagnosis not present

## 2022-03-30 DIAGNOSIS — O1093 Unspecified pre-existing hypertension complicating the puerperium: Secondary | ICD-10-CM | POA: Diagnosis not present

## 2022-03-30 DIAGNOSIS — O9089 Other complications of the puerperium, not elsewhere classified: Secondary | ICD-10-CM | POA: Insufficient documentation

## 2022-03-30 DIAGNOSIS — Z8249 Family history of ischemic heart disease and other diseases of the circulatory system: Secondary | ICD-10-CM | POA: Diagnosis not present

## 2022-03-30 DIAGNOSIS — R509 Fever, unspecified: Secondary | ICD-10-CM | POA: Insufficient documentation

## 2022-03-30 DIAGNOSIS — Z7722 Contact with and (suspected) exposure to environmental tobacco smoke (acute) (chronic): Secondary | ICD-10-CM | POA: Insufficient documentation

## 2022-03-30 DIAGNOSIS — Z79899 Other long term (current) drug therapy: Secondary | ICD-10-CM | POA: Diagnosis not present

## 2022-03-30 DIAGNOSIS — R519 Headache, unspecified: Secondary | ICD-10-CM | POA: Insufficient documentation

## 2022-03-30 LAB — CBC
HCT: 37.9 % (ref 36.0–46.0)
Hemoglobin: 12.4 g/dL (ref 12.0–15.0)
MCH: 29.8 pg (ref 26.0–34.0)
MCHC: 32.7 g/dL (ref 30.0–36.0)
MCV: 91.1 fL (ref 80.0–100.0)
Platelets: 204 10*3/uL (ref 150–400)
RBC: 4.16 MIL/uL (ref 3.87–5.11)
RDW: 14.2 % (ref 11.5–15.5)
WBC: 5.2 10*3/uL (ref 4.0–10.5)
nRBC: 0 % (ref 0.0–0.2)

## 2022-03-30 LAB — URINALYSIS, ROUTINE W REFLEX MICROSCOPIC
Bacteria, UA: NONE SEEN
Bilirubin Urine: NEGATIVE
Glucose, UA: NEGATIVE mg/dL
Hgb urine dipstick: NEGATIVE
Ketones, ur: NEGATIVE mg/dL
Nitrite: NEGATIVE
Protein, ur: NEGATIVE mg/dL
Specific Gravity, Urine: 1.014 (ref 1.005–1.030)
pH: 5 (ref 5.0–8.0)

## 2022-03-30 LAB — RESP PANEL BY RT-PCR (FLU A&B, COVID) ARPGX2
Influenza A by PCR: NEGATIVE
Influenza B by PCR: NEGATIVE
SARS Coronavirus 2 by RT PCR: NEGATIVE

## 2022-03-30 MED ORDER — CYCLOBENZAPRINE HCL 10 MG PO TABS: 10.0000 mg | ORAL_TABLET | Freq: Two times a day (BID) | ORAL | 0 refills | Status: DC | PRN

## 2022-03-30 MED ORDER — CYCLOBENZAPRINE HCL 5 MG PO TABS
10.0000 mg | ORAL_TABLET | Freq: Once | ORAL | Status: AC
  Administered 2022-03-30: 10 mg via ORAL
  Filled 2022-03-30: qty 2

## 2022-03-30 NOTE — MAU Note (Signed)
Jessica Kelley is a 31 y.o. here in MAU reporting: PP s/p c/s on 10/11. Has been having headaches but reports baby came home from NICU 2 days ago the headache has been more consistent. This AM reports 100.2 and 99 temp. Also reporting right leg numbness "for a while now".  Onset of complaint: ongoing  Pain score: 8/10  Vitals:   03/30/22 0738  BP: 111/76  Pulse: 92  Resp: 16  Temp: 98.1 F (36.7 C)  SpO2: 97%     FHT: NA  Lab orders placed from triage: none

## 2022-03-30 NOTE — MAU Provider Note (Signed)
History     CSN: [id number]  Arrival date and time: 03/30/22 9242   Event Date/Time   First Provider Initiated Contact with Patient 03/30/22 0815      Chief Complaint  Patient presents with   Headache   HPI Jessica Kelley is a 31 y.o. A8T4196 s/p pLTCS on 10/11 who presents to MAU for various complaints. Pregnancy was complicated by FGR, Q2WL, cHTN with superimposed pre-eclampsia, PPH, and chronic inflammatory demyelinating polyradiculopathy. Today, she is reporting a fever at home this morning of 100.2 followed by a temp of 99.9 using a "forehead thermometer". She is also reporting a headache on and off " for awhile" which usually goes away without intervention or with Tylenol, however for the past 2 days has been unrelieved with Tylenol. She reports BP's have been well controlled with Labetalol 358m TID usually ranging between 120s-130s/80s. She reports 1 single floater in vision but this has been an ongoing issue. She denies sore throat, runny nose, congestion, or known sick contacts. She reports bleeding is scant, she is not having any abdominal, back, or pelvic pain, malodorous discharge, or urinary s/s.   Of note, patient did bring baby home from NICU 2 days ago. She reports that she often does not eat breakfast, but has a decent lunch and dinner. She sometimes snacks between meals but not always. She reports she drinks 1-2 40oz cups of water per day. She has a good support system at home. She says that she does not sleep a lot and last night she barely slept at all because she "watched the baby" all night. She is lucky if she gets a couple of hours of sleep per night.  She has her postpartum visit scheduled on 11/21 at [facility].   OB History     Gravida  2   Para  1   Term      Preterm  1   AB  1   Living  1      SAB  1   IAB      Ectopic      Multiple  0   Live Births  1           Past Medical History:  Diagnosis Date   Brain bleed  ([facility]    Patient stated   Celiac disease    CIDP (chronic inflammatory demyelinating polyneuropathy) ([place] 2001   [place]abetes [place] I [place]    Dr. BTobe Sos  Glaucoma    Hyperthyroidism    Macular degeneration    Pilonidal cyst    Scoliosis    Wyburn-Mason syndrome     Past Surgical History:  Procedure Laterality Date   CATARACT EXTRACTION  06/25/06   CESAREAN SECTION N/A 02/27/2022   Procedure: CESAREAN SECTION;  Surgeon: LLouretta Shorten MD;  [facility];  Service: Obstetrics;  Laterality: N/A;   FOOT ARTHROPLASTY     left foot moved and stretched tendons    GLAUCOMA SURGERY  02/12/06   HIP ARTHROPLASTY     right never bipopsy   PORTACATH PLACEMENT  2002-2004   removal of vascular port     Sciatic Nerve Muscle Biopsy      Family History  Problem Relation Age of Onset   Thyroid disease Mother    Diabetes Sister    Other Sister        thyroid issues   Heart disease Maternal Grandmother    Hypertension Maternal Grandfather    Prostate cancer Maternal Grandfather  Cancer Maternal Grandfather        prostate   Cancer Paternal Grandmother        Died at 74 - kidney   Heart disease Paternal Grandfather    Coronary artery disease Paternal Grandfather    Hyperlipidemia Other    Asthma Neg Hx    Birth defects Neg Hx    Kidney disease Neg Hx    Stroke Neg Hx     Social History   Tobacco Use   Smoking status: Never   Smokeless tobacco: Never   Tobacco comments:    passive smoke exposure  Vaping Use   Vaping Use: Never used  Substance Use Topics   Alcohol use: Not Currently    Comment: not while preg   Drug use: No    Allergies:  Allergies  Allergen Reactions   Ginger Rash    No medications prior to admission.   Review of Systems  Constitutional:  Positive for fever.  HENT: Negative.    Respiratory: Negative.    Cardiovascular: Negative.   Gastrointestinal: Negative.   Genitourinary: Negative.   Musculoskeletal: Negative.   Neurological:  Positive  for headaches.   Physical Exam  Patient Vitals for the past 24 hrs:  BP Temp Temp src Pulse Resp SpO2  03/30/22 0942 117/76 -- -- 96 -- --  03/30/22 0752 126/77 -- -- 93 -- --  03/30/22 0738 111/76 98.1 F (36.7 C) Oral 92 16 97 %   Physical Exam Vitals and nursing note reviewed.  Constitutional:      General: She is not in acute distress. Eyes:     Extraocular Movements: Extraocular movements intact.     Pupils: Pupils are equal, round, and reactive to light.  Cardiovascular:     Rate and Rhythm: Normal rate.  Pulmonary:     Effort: Pulmonary effort is normal.  Abdominal:     Palpations: Abdomen is soft.     Tenderness: There is no abdominal tenderness.     Comments: Incision is healing appropriately, well approximated, no erythema, no drainage, no tenderness  Musculoskeletal:        General: Normal range of motion.     Cervical back: Normal range of motion.  Skin:    General: Skin is warm and dry.  Neurological:     Mental Status: She is alert and oriented to person, place, and time. Mental status is at baseline.  Psychiatric:        Mood and Affect: Mood normal.        Behavior: Behavior normal.    MAU Course  Procedures  MDM UA CBC Covid/Flu Flexeril  BP's normotensive and well controlled. Patient afebrile. Physical exam benign. UA and CBC unremarkable. Covid/flu test requested by patient given fever at home. Flexeril given for headache which was initially 8/10. On reassessment approximately 45 minutes after being given medication, she reports headache 4/10. I reviewed with patient that there are a lot of factors that could be contributing to her symptoms, including the stresses of having a NICU baby and bringing baby home recently as of 2 days ago. I reviewed the importance of caring for self as she is not sleeping, eating, or hydrating adequately and that these could likely be contributing to symptoms. She has a great support system at home. Her mother is present  at the bedside who confirms the above. I have a low suspicion for postpartum pre-eclampsia as patient is normotensive and has no other symptoms at this time. She may continue  to take Tylenol prn. I will also send rx for Flexeril prn. At this time I feel patient is stable for discharge home. Covid and flu swab pending and patient will be contacted regarding any positive results. She may also check results in MyChart.    Assessment and Plan  Postpartum Headache  - Discharge home in stable condition - Rx for Flexeril - Strict return precautions. Return to MAU as needed for new/worsening symptoms - Keep postpartum appointment as scheduled on 11/21   Renee Harder, [place] 03/30/2022, 9:44 AM

## 2022-07-09 ENCOUNTER — Telehealth (INDEPENDENT_AMBULATORY_CARE_PROVIDER_SITE_OTHER): Payer: Self-pay | Admitting: "Endocrinology

## 2022-07-09 NOTE — Telephone Encounter (Signed)
  Name of who is calling: Lenise Arena Relationship to Patient: Self  Best contact number: [phone]  Provider they see:  Reason for call: Prev Dr.Brennan Patient. Jessica Kelley is wondering if she should schedule with another provider in office or should she be referred out to an adult endo provider. She's requesting a callback.     PRESCRIPTION REFILL ONLY  Name of prescription:  Pharmacy:

## 2022-07-12 NOTE — Telephone Encounter (Signed)
Spoke with patient. Let her know that we dont have anyone in the office who sees adult pts. That her PCP will need to put in the referral. She verbally understood.

## 2022-07-22 ENCOUNTER — Encounter: Payer: Self-pay | Admitting: Family Medicine

## 2022-07-22 ENCOUNTER — Ambulatory Visit: Payer: BC Managed Care – PPO | Admitting: Family Medicine

## 2022-07-22 VITALS — BP 138/90 | HR 111 | Temp 98.0°F | Resp 18 | Ht 65.0 in | Wt 165.0 lb

## 2022-07-22 DIAGNOSIS — G6181 Chronic inflammatory demyelinating polyneuritis: Secondary | ICD-10-CM | POA: Diagnosis not present

## 2022-07-22 DIAGNOSIS — E1069 Type 1 diabetes mellitus with other specified complication: Secondary | ICD-10-CM | POA: Diagnosis not present

## 2022-07-22 DIAGNOSIS — E0500 Thyrotoxicosis with diffuse goiter without thyrotoxic crisis or storm: Secondary | ICD-10-CM | POA: Diagnosis not present

## 2022-07-22 DIAGNOSIS — S065X1A Traumatic subdural hemorrhage with loss of consciousness of 30 minutes or less, initial encounter: Secondary | ICD-10-CM

## 2022-07-22 DIAGNOSIS — H3530 Unspecified macular degeneration: Secondary | ICD-10-CM

## 2022-07-22 DIAGNOSIS — Q282 Arteriovenous malformation of cerebral vessels: Secondary | ICD-10-CM

## 2022-07-22 DIAGNOSIS — E063 Autoimmune thyroiditis: Secondary | ICD-10-CM

## 2022-07-22 NOTE — Assessment & Plan Note (Signed)
Per endo----we were going to do labs today but the lab was closed

## 2022-07-22 NOTE — Assessment & Plan Note (Signed)
Refer to endo ---  labs need to be drawn but it was closed today

## 2022-07-22 NOTE — Progress Notes (Signed)
Established Patient Office Visit  Subjective   Patient ID: Jessica Kelley, female    DOB: 09/03/1990  Age: 32 y.o. MRN: [id number]  Chief Complaint  Patient presents with   Forms   Referral    Pt requesting referral to Endo. 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   Encounter for long-term (current) use of other medications 11/09/2012   Thyrotoxicosis with diffuse goiter 02/25/2012   Thyroiditis, autoimmune 02/25/2012   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 ([place]) 02/25/2012   Goiter 02/25/2012   Pilonidal cyst with abscess 12/12/2010   UNSPECIFIED ANEMIA 03/27/2009   VIRAL URI 06/14/2008   ELEVATED BLOOD PRESSURE WITHOUT DIAGNOSIS OF HYPERTENSION 05/11/2008   KNEE PAIN, RIGHT 01/26/2008   UNSPECIFIED TACHYCARDIA 12/07/2007   HEADACHE 06/04/2007   Hypothyroidism 02/17/2007   FOOT PAIN, RIGHT 02/17/2007   SCOLIOSIS NEC 02/17/2007   Type 1 diabetes mellitus ([place]) 10/07/2006   CIDP (chronic inflammatory demyelinating polyneuropathy) ([place]) 10/07/2006   Macular degeneration (senile) of retina 10/07/2006   DISEASE, CELIAC 10/07/2006   Past Medical History:  Diagnosis Date   Brain bleed [facility])    Patient stated   Celiac disease    CIDP (chronic inflammatory demyelinating polyneuropathy) ([place]) 2001   Diabetes type I ([place])    Dr. Tobe Sos    Glaucoma    Hyperthyroidism    Macular degeneration    Pilonidal cyst    Scoliosis    Wyburn-Mason syndrome    Past Surgical History:  Procedure Laterality Date   CATARACT EXTRACTION  06/25/06   CESAREAN SECTION N/A 02/27/2022   Procedure: CESAREAN SECTION;  Surgeon: Louretta Shorten, MD;  Location: [facility];  Service: Obstetrics;  Laterality: N/A;   FOOT ARTHROPLASTY     left foot moved and stretched tendons    GLAUCOMA SURGERY  02/12/06   HIP ARTHROPLASTY     right never bipopsy   PORTACATH PLACEMENT  2002-2004   removal of vascular port     Sciatic Nerve Muscle Biopsy     Social History   Tobacco Use   Smoking status: Never   Smokeless tobacco: Never   Tobacco comments:    passive smoke exposure  Vaping Use   Vaping Use: Never used  Substance Use Topics   Alcohol use: Not Currently    Comment: not while preg   Drug use: No   Social History   Socioeconomic History   Marital status: Single    Spouse name: Not on file   Number of children: Not on file   Years of education: Not on file   Highest education level: Not on file  Occupational History   Not on file  Tobacco Use   Smoking status: Never   Smokeless tobacco: Never   Tobacco comments:    passive smoke exposure  Vaping Use   Vaping Use: Never used  Substance and Sexual Activity   Alcohol use: Not Currently  Comment: not while preg   Drug use: No   Sexual activity: Yes    Partners: Male  Other Topics Concern   Not on file  Social History Narrative   Jessica Kelley is a 32 yo woman    She is employed with [employer].   She lives with her husband and has one sister, 75 yo.   She enjoys working, camping, and walking outside.   Social Determinants of Health   Financial Resource Strain: Not on file  Food Insecurity: Not on file  Transportation Needs: Not on file  Physical Activity: Not on file  Stress: Not on file  Social Connections: Not on file  Intimate Partner Violence: Not on file    Family Status  Relation Name Status   Mother  Alive   Father  Alive   Sister  Alive       Older   MGM  Alive   MGF  Alive   PGM  Deceased   PGF  Alive   Other  (Not Specified)   Neg Hx  (Not Specified)   Family History  Problem Relation Age of Onset   Thyroid disease Mother    Diabetes Sister    Other Sister        thyroid issues   Heart disease Maternal Grandmother    Hypertension Maternal Grandfather    Prostate cancer Maternal Grandfather    Cancer Maternal Grandfather        prostate   Cancer Paternal Grandmother        Died at 6 - kidney   Heart disease Paternal Grandfather    Coronary artery disease Paternal Grandfather    Hyperlipidemia Other    Asthma Neg Hx    Birth defects Neg Hx    Kidney disease Neg Hx    Stroke Neg Hx    Allergies  Allergen Reactions   Ginger Rash      Review of Systems  Constitutional:  Negative for fever and malaise/fatigue.  HENT:  Negative for congestion.   Eyes:  Negative for blurred vision.  Respiratory:  Negative for cough and shortness of breath.   Cardiovascular:  Negative for chest pain, palpitations and leg swelling.  Gastrointestinal:  Negative for vomiting.  Musculoskeletal:  Negative for back pain.  Skin:  Negative for rash.  Neurological:  Positive for weakness. Negative for loss of consciousness and headaches.      Objective:     BP (!) 138/90 (BP Location: Left Arm, Patient Position: Sitting, Cuff Size: Normal)   Pulse (!) 111   Temp 98 F (36.7 C) (Oral)   Resp 18   Ht '5\' 5"'$  (1.651 m)   Wt 165 lb (74.8 kg) Comment: wheelchair  SpO2 97%   BMI 27.46 kg/m  BP Readings from Last 3 Encounters:  07/22/22 (!) 138/90  03/30/22 117/76  03/05/22 133/75   Wt Readings from Last 3 Encounters:  07/22/22 165 lb (74.8 kg)  02/25/22 188 lb (85.3 kg)  02/19/22 188 lb (85.3 kg)   SpO2 Readings from Last 3 Encounters:  07/22/22 97%  03/30/22 97%  03/05/22 95%      Physical Exam Vitals and nursing note  reviewed.  Constitutional:      Appearance: She is well-developed.  HENT:     Head: Normocephalic and atraumatic.  Eyes:     Conjunctiva/sclera: Conjunctivae normal.  Neck:     Thyroid: No thyromegaly.     Vascular: No carotid bruit or JVD.  Cardiovascular:  Rate and Rhythm: Normal rate and regular rhythm.     Heart sounds: Normal heart sounds. No murmur heard. Pulmonary:     Effort: Pulmonary effort is normal. No respiratory distress.     Breath sounds: Normal breath sounds. No wheezing or rales.  Chest:     Chest wall: No tenderness.  Musculoskeletal:     Cervical back: Normal range of motion and neck supple.  Neurological:     Mental Status: She is alert and oriented to person, place, and time.     Comments: 10-15% rom R leg  Strength R low ext about 2/5 Strength and rom perfect in L leg and L upper ext b/l      No results found for any visits on 07/22/22.  Last CBC Lab Results  Component Value Date   WBC 5.2 03/30/2022   HGB 12.4 03/30/2022   HCT 37.9 03/30/2022   MCV 91.1 03/30/2022   MCH 29.8 03/30/2022   RDW 14.2 03/30/2022   PLT 204 [id number]   Last metabolic panel Lab Results  Component Value Date   GLUCOSE 87 03/02/2022   NA 133 (L) 03/02/2022   K 3.4 (L) 03/02/2022   CL 101 03/02/2022   CO2 27 03/02/2022   BUN 9 03/02/2022   CREATININE 0.55 03/02/2022   GFRNONAA >60 03/02/2022   CALCIUM 8.2 (L) 03/02/2022   PROT 6.1 (L) 03/02/2022   ALBUMIN 2.0 (L) 03/02/2022   BILITOT 0.3 03/02/2022   ALKPHOS 84 03/02/2022   AST 35 03/02/2022   ALT 19 03/02/2022   ANIONGAP 5 03/02/2022   Last lipids Lab Results  Component Value Date   CHOL 187 04/09/2021   HDL 71 04/09/2021   LDLCALC 100 (H) 04/09/2021   LDLDIRECT 136.9 04/25/2008   TRIG 73 04/09/2021   CHOLHDL 2.6 04/09/2021   Last hemoglobin A1c Lab Results  Component Value Date   HGBA1C 7.2 (H) 02/27/2022   Last thyroid functions Lab Results  Component Value Date   TSH 1.138 02/25/2022    Last vitamin D No results found for: "25OHVITD2", "25OHVITD3", "VD25OH" Last vitamin B12 and Folate Lab Results  Component Value Date   VITAMINB12 654 12/07/2007   FOLATE > 20.0 ng/mL 12/07/2007      The ASCVD Risk score (Arnett DK, et al., 2019) failed to calculate for the following reasons:   The 2019 ASCVD risk score is only valid for ages 39 to 70    Assessment & Plan:   Problem List Items Addressed This Visit       Unprioritized   Wyburn-Mason syndrome    Per ophthalmology       Type 1 diabetes mellitus ([place])    Refer to endo ---  labs need to be drawn but it was closed today      Relevant Orders   Ambulatory referral to Endocrinology   CBC with Differential/Platelet   Comprehensive metabolic panel   Lipid panel   TSH   Hemoglobin A1c   Thyrotoxicosis with diffuse goiter   Relevant Orders   TSH   Thyroiditis, autoimmune    Per endo----we were going to do labs today but the lab was closed        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    [name], DO

## 2022-07-22 NOTE — Assessment & Plan Note (Signed)
Per ophthalmology

## 2022-07-22 NOTE — Assessment & Plan Note (Signed)
Refer to endo  For dm and thyroid

## 2022-07-22 NOTE — Assessment & Plan Note (Signed)
Per optho

## 2022-07-22 NOTE — Assessment & Plan Note (Signed)
Con't with IVIG Refer to new neuro at pt request --- wants to be closer to home

## 2022-12-05 ENCOUNTER — Encounter (INDEPENDENT_AMBULATORY_CARE_PROVIDER_SITE_OTHER): Payer: Self-pay

## 2022-12-24 ENCOUNTER — Telehealth: Payer: Self-pay

## 2022-12-24 NOTE — Telephone Encounter (Signed)
Left message for patient to call office - need to reschedule her MD Consult with Dr. Parke Poisson - he knows her history... Please reschedule on a day when we have 3 providers here and with Dr. Parke Poisson.

## 2022-12-26 ENCOUNTER — Ambulatory Visit: Payer: BC Managed Care – PPO

## 2022-12-26 ENCOUNTER — Encounter: Payer: Self-pay | Admitting: *Deleted

## 2022-12-31 ENCOUNTER — Ambulatory Visit: Payer: BC Managed Care – PPO | Admitting: *Deleted

## 2022-12-31 ENCOUNTER — Ambulatory Visit: Payer: BC Managed Care – PPO | Admitting: Obstetrics

## 2022-12-31 DIAGNOSIS — Z3169 Encounter for other general counseling and advice on procreation: Secondary | ICD-10-CM

## 2022-12-31 DIAGNOSIS — O09299 Supervision of pregnancy with other poor reproductive or obstetric history, unspecified trimester: Secondary | ICD-10-CM

## 2022-12-31 DIAGNOSIS — O24019 Pre-existing diabetes mellitus, type 1, in pregnancy, unspecified trimester: Secondary | ICD-10-CM

## 2022-12-31 DIAGNOSIS — Z8759 Personal history of other complications of pregnancy, childbirth and the puerperium: Secondary | ICD-10-CM

## 2022-12-31 DIAGNOSIS — O24119 Pre-existing diabetes mellitus, type 2, in pregnancy, unspecified trimester: Secondary | ICD-10-CM

## 2022-12-31 NOTE — Progress Notes (Signed)
Pre conception V/S:  162 90 pulse: 97

## 2023-01-01 NOTE — Progress Notes (Signed)
MFM Note  Jessica Kelley is a 32 year old gravida 2 para 1 who has a complex medical history that is significant for pregestational diabetes, hyperthyroidism, and chronic inflammatory demyelinating polyradiculopathy (CIDP).    Her last pregnancy in 2023 was complicated by IUGR with absent end-diastolic flow noted on umbilical artery Doppler studies along with possible preeclampsia requiring delivery at around 34 weeks.  She reports that her baby spent about 1 month in the NICU after delivery.  He is 58 months old now and is growing appropriately and doing well.  She was seen today for a preconception consultation as she is considering conceiving another pregnancy in the near future and wanted to discuss the risks associated another pregnancy.   Her current medications include labetalol 200 mg 3 times a day, IVIG weekly, and IV Solu-Medrol 50 mg weekly.  She currently has a Dexcom continuous glucose monitor and an insulin pump in place.  She reports that her endocrinologist discontinued PTU as her thyroid function tests have been within normal limits.  She currently has an IUD in for contraception.  She will have the IUD removed should she decide to proceed with another pregnancy.  Jessica Kelley was advised that there are no relative contraindications for her to conceive another pregnancy should she want another child.  She may proceed with another pregnancy should she desire.  She was advised that due to her underlying medical conditions and her history of IUGR with possible preeclampsia, that her future pregnancy may be at risk for another IUGR fetus and an indicated preterm birth.  The increased risk of fetal congenital anomalies such as a congenital heart defect and spina bifida associated with pregestational diabetes was discussed.  As her prior hemoglobin A1c measurements were between the 7% to 10% range, she was advised to continue using her Dexcom glucose monitor and insulin pump to help  her achieve better glycemic control prior to conceiving her next pregnancy.  The goal for her hemoglobin A1c prior to conceiving her next pregnancy should be between 6% to 7%.  She understands that it may not be possible to predict with certainty what complications she may develop during her future pregnancy and what the outcome will be for her future pregnancy.  She will consider all risks that were discussed with her and will decide regarding whether or not she wants to attempt another pregnancy soon.  She was advised to start taking a daily prenatal vitamin or multivitamin and an extra folic acid during the preconception period.   To decrease the risk of recurrent preeclampsia, we will recommend that she take 2 tablets of baby aspirin (81 mg each) starting at 11 to 12 weeks of her future pregnancy.  Should she decide to proceed with another pregnancy, we will continue to follow her closely with you.   She should be referred to our office for a first trimester ultrasound at between 7 to 10 weeks.    We will make further recommendations regarding the management of her future pregnancy at that time.    The patient stated that all of her questions were answered today.    A total of 60 minutes was spent counseling and coordinating the care for this patient.  Greater than 50% of the time was spent in direct face-to-face contact.

## 2023-07-17 ENCOUNTER — Ambulatory Visit (INDEPENDENT_AMBULATORY_CARE_PROVIDER_SITE_OTHER): Payer: 59 | Admitting: Plastic Surgery

## 2023-07-17 ENCOUNTER — Encounter: Payer: Self-pay | Admitting: Plastic Surgery

## 2023-07-17 VITALS — BP 123/82 | HR 100 | Ht 65.0 in | Wt 165.0 lb

## 2023-07-17 DIAGNOSIS — E65 Localized adiposity: Secondary | ICD-10-CM

## 2023-07-17 NOTE — Progress Notes (Signed)
 Referring Provider Jessica Kelley, Jessica R, DO [address] [place],  [place] [address]   CC:  Chief Complaint  Patient presents with   Advice Only      Jessica Kelley is an 33 y.o. female.  HPI: Jessica Kelley is a 33 year old female who presents today for evaluation of an area of fat hypertrophy on the left lower aspect of her abdominal wall.  Patient states that this is where she has injected herself with insulin in the past.  She noted the increased hypertrophy shortly after the birth of her child in 2023.  It has not improved with continued weight loss efforts.  The area of hypertrophy often interferes with her daily activities especially with wearing a seatbelt and with wearing close.  Of note the patient is an insulin-dependent diabetic since childhood.  She also has a demyelinating disease for which she is on chronic steroids.  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  Because she is in a wheelchair and the fat hypertrophy is often uncomfortable when she is working at the computer.  Allergies  Allergen Reactions   Other Other (See Comments)    Wheat Gluten Extract--causes GI irritation    Ginger Rash    Outpatient Encounter Medications as of 07/17/2023  Medication Sig   acetaminophen (TYLENOL) 325 MG tablet Take 2 tablets (650 mg total) by mouth every 4 (four) hours as needed for mild pain (temperature > 101.5.).   Continuous Glucose Sensor (DEXCOM G7 SENSOR) MISC APLY 1 SENSOR EVERY 10 DAYS   ibuprofen (MOTRIN IB) 200 MG tablet Take 2 tablets (400 mg total) by mouth every 6 (six) hours as needed.   Immune Globulin 10% (GAMMAGARD) 10 GM/100ML SOLN Infuse 700 mL (70 g total) into a venous catheter once a week.   insulin lispro (HUMALOG) 100 UNIT/ML injection Inject 1.5 Units into the skin continuous as needed for high blood sugar (insulin pump).   methylPREDNISolone Sodium Succ (SOLU-MEDROL IJ) Inject as directed  once a week.   Multiple Vitamin (MULTIVITAMIN ADULT PO)    progesterone (PROMETRIUM) 200 MG capsule Take 200 mg by mouth at bedtime.   labetalol (NORMODYNE) 300 MG tablet Take 1 tablet (300 mg total) by mouth 3 (three) times daily.   [DISCONTINUED] cyclobenzaprine (FLEXERIL) 10 MG tablet Take 1 tablet (10 mg total) by mouth 2 (two) times daily as needed for muscle spasms. (Patient not taking: Reported on 07/17/2023)   [DISCONTINUED] docusate sodium (COLACE) 100 MG capsule Take 1 capsule (100 mg total) by mouth daily. (Patient not taking: Reported on 07/17/2023)   [DISCONTINUED] enoxaparin (LOVENOX) 40 MG/0.4ML injection Inject 0.4 mLs (40 mg total) into the skin daily.   [DISCONTINUED] immune globulin, human, (GAMMAGARD S/D LESS IGA) 10 g injection Inject into the vein.   No facility-administered encounter medications on file as of 07/17/2023.     Past Medical History:  Diagnosis Date   Brain bleed Jessica Kelley)    Patient stated   Celiac disease    Chest pain 06/12/2016   Chronic tension type headache 11/09/2012   CIDP (chronic inflammatory demyelinating polyneuropathy) (HCC) 2001   Diabetes type I (HCC)    Dr. 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   Insomnia, unspecified 11/09/2012   KNEE PAIN, RIGHT 01/26/2008   Qualifier: Diagnosis of   By: Jessica Kelley         Macular degeneration    Macular degeneration (senile) of retina 10/07/2006   Qualifier: Diagnosis of   By: Jessica Kelley         Migraine without aura 11/09/2012   Palpitations 06/12/2016   Pilonidal cyst    Scoliosis    Strep throat 10/16/2017   UNSPECIFIED ANEMIA 03/27/2009   Qualifier: Diagnosis of   By: Jessica Kelley         UNSPECIFIED TACHYCARDIA 12/07/2007   Qualifier: Diagnosis of   By: Jessica Kelley         Wyburn-Mason syndrome      Past Surgical History:  Procedure Laterality Date   CATARACT EXTRACTION  06/25/06   CESAREAN SECTION N/A 02/27/2022   Procedure: CESAREAN SECTION;  Surgeon: Jessica Camp, MD;  Location: [facility];  Service: Obstetrics;  Laterality: N/A;   FOOT ARTHROPLASTY     left foot moved and stretched tendons    GLAUCOMA SURGERY  02/12/06   HIP ARTHROPLASTY     right never bipopsy   PORTACATH PLACEMENT  2002-2004   removal of vascular port     Sciatic Nerve Muscle Biopsy      Family History  Problem Relation Age of Onset   Thyroid disease Mother    Diabetes Sister    Other Sister        thyroid issues   Heart disease Maternal Grandmother    Hypertension Maternal Grandfather    Prostate cancer Maternal Grandfather    Cancer Maternal Grandfather        prostate   Cancer Paternal Grandmother        Died at 54 - kidney   Heart disease Paternal Grandfather    Coronary artery disease Paternal Grandfather    Hyperlipidemia Other    Asthma Neg Hx    Birth defects Neg Hx    Kidney disease Neg Hx    Stroke Neg Hx     Social History   Social History Narrative   Jessica Kelley is a 33 yo woman    She is employed with [employer].   She lives with her husband and has one sister, 22 yo.   She enjoys working, camping, and walking outside.     Review of Systems General: Denies fevers, chills, weight loss CV: Denies chest pain, shortness of breath, palpitations Abdomen: Area approximately 15 x 5 cm on the left lower abdominal wall where she has hypertrophy of the fat.  There is no pain to palpation.  Physical Exam    07/17/2023    9:27 AM 07/22/2022    3:54 PM 03/30/2022    9:42 AM  Vitals with BMI  Height 5\' 5"  5\' 5"    Weight 165 lbs 165 lbs   BMI 27.46 27.46   Systolic 123 138 409  Diastolic 82 90 76  Pulse 100 111 96    General:  No acute distress,  Alert and oriented, Non-Toxic, Normal speech and affect Abdomen: As noted above area of fat hypertrophy.  There is no  distinct mass such as a lipoma.  There is no palpable hernia.  There is no tenderness to palpation. Mammogram: Not applicable due to age Assessment/Plan Fat hypertrophy, left lower abdominal wall: Patient has a area of fat hypertrophy probably related to her insulin use.  I believe this  could be excised reasonably easy.  We did discuss the fact that she is at a very high risk for wound healing complications due to her chronic steroid use and to her diabetes.  She has a vacation coming up and may and would like to delay surgery until after she returns.  I think this is completely reasonable.  When we do schedule her procedure she will need a anesthesia consult to evaluate for stress dose steroids and whether she should remain in the hospital overnight for observation.  Photographs were obtained today with her consent.  All questions were answered to her satisfaction.  She will return to see me the week after she returns from her vacation and we will schedule her surgery at that time.  Santiago Glad 07/17/2023, 12:28 PM

## 2023-10-16 ENCOUNTER — Ambulatory Visit: Payer: 59 | Admitting: Plastic Surgery

## 2024-03-01 ENCOUNTER — Other Ambulatory Visit: Payer: Self-pay | Admitting: Obstetrics & Gynecology

## 2024-03-01 DIAGNOSIS — Z369 Encounter for antenatal screening, unspecified: Secondary | ICD-10-CM

## 2024-03-30 DIAGNOSIS — F419 Anxiety disorder, unspecified: Secondary | ICD-10-CM | POA: Insufficient documentation

## 2024-03-30 DIAGNOSIS — Q2730 Arteriovenous malformation, site unspecified: Secondary | ICD-10-CM | POA: Insufficient documentation

## 2024-04-07 ENCOUNTER — Ambulatory Visit: Attending: Obstetrics and Gynecology

## 2024-04-07 ENCOUNTER — Ambulatory Visit (HOSPITAL_BASED_OUTPATIENT_CLINIC_OR_DEPARTMENT_OTHER): Admitting: Obstetrics and Gynecology

## 2024-04-07 ENCOUNTER — Other Ambulatory Visit: Payer: Self-pay | Admitting: *Deleted

## 2024-04-07 VITALS — BP 122/79 | HR 96

## 2024-04-07 DIAGNOSIS — Z3A10 10 weeks gestation of pregnancy: Secondary | ICD-10-CM | POA: Diagnosis present

## 2024-04-07 DIAGNOSIS — O34219 Maternal care for unspecified type scar from previous cesarean delivery: Secondary | ICD-10-CM | POA: Diagnosis not present

## 2024-04-07 DIAGNOSIS — O24011 Pre-existing diabetes mellitus, type 1, in pregnancy, first trimester: Secondary | ICD-10-CM | POA: Diagnosis present

## 2024-04-07 DIAGNOSIS — O10011 Pre-existing essential hypertension complicating pregnancy, first trimester: Secondary | ICD-10-CM

## 2024-04-07 DIAGNOSIS — O24111 Pre-existing diabetes mellitus, type 2, in pregnancy, first trimester: Secondary | ICD-10-CM | POA: Diagnosis not present

## 2024-04-07 DIAGNOSIS — Z369 Encounter for antenatal screening, unspecified: Secondary | ICD-10-CM | POA: Insufficient documentation

## 2024-04-07 DIAGNOSIS — E119 Type 2 diabetes mellitus without complications: Secondary | ICD-10-CM | POA: Diagnosis not present

## 2024-04-07 DIAGNOSIS — O10911 Unspecified pre-existing hypertension complicating pregnancy, first trimester: Secondary | ICD-10-CM

## 2024-04-07 DIAGNOSIS — E1069 Type 1 diabetes mellitus with other specified complication: Secondary | ICD-10-CM

## 2024-04-07 NOTE — Progress Notes (Signed)
 Maternal-Fetal Medicine Consultation  Name: Jessica Kelley  MRN: [id number]  GA: [id number] [redacted]w[redacted]d   Patient is here for early pregnancy ultrasound and consultation.  She is well-known to us  and had preconception consultation in August 2024 with Dr. Ileana. Her high-risk problems include: - Type 1 diabetes.  Patient will be switching to a new endocrinologist next week.  She has continuous glucose monitoring and is on insulin  pump.  Recent hemoglobin A1c was 7.7%.  She has episodes of hyperglycemia following IV Solu-Medrol  infusion, which she takes on Mondays. Patient takes low-dose aspirin  prophylaxis. - Chronic hypertension.  Patient takes labetalol  200 mg twice daily.  Her blood pressures have been within normal range.  Blood pressure today at our office is 122/79 mmHg. - Chronic inflammatory demyelinating polyradiculopathy (CIDP). - History of fetal growth restriction.  Her previous pregnancy was complicated by fetal growth restriction and abnormal Doppler studies (absent end-diastolic flow).  Patient had cesarean delivery at [redacted] weeks gestation and had a female infant that weighed 4 pounds and 13 ounces at birth.  He is in good health. - Previous cesarean delivery. - Hyperthyroidism.  Patient was taking PTU.  She did not have hyperthyroidism between pregnancies and her endocrinologist had discontinued antithyroid drugs.  She reports TSH suppression early in this pregnancy. Prenatal care: Patient reports she had cell-free fetal DNA screening that showed no increased risk for fetal aneuploidies (fetal sex was reported as female).  Ultrasound The CRL measurement is consistent with previous ultrasound dating performed at your office.  Good fetal heart activity seen.  No evidence of cystic hygroma.  Type 1 diabetes I discussed the importance of good blood glucose control to prevent fetal and neonatal adverse outcomes.  Hemoglobin A1c of 7.7% is associated with 5% incidence of congenital malformations  (background rate 2%).  Congenital malformations of heart, spine and central nervous system are more common.  Patient reports that she is giving bolus injections to cover hyperglycemia following Solu-Medrol  infusions. Complications of poorly controlled diabetes include fetal macrosomia, shoulder dystocia and neurological injuries at birth.  Neonatal complications include respiratory distress syndrome and hyperbilirubinemia.  Stillbirth can occur and poorly controlled diabetes. I discussed our guidelines and monitoring pregnancies with diabetes with serial fetal growth assessments and weekly antenatal testing from [redacted] weeks gestation. Timing of delivery: Because of comorbid condition of diabetes and hypertension, it is reasonable to consider delivery at 37-or 38-weeks' gestation. Patient reports that your office has scheduled her for fetal echocardiography.  Chronic hypertension I discussed the safety profiles of antihypertensives used in pregnancy.  Both labetalol  and nifedipine are not associated with increased risk of congenital malformations.  Chronic hypertension and history of fetal growth restriction increase the risk of fetal growth restriction in this pregnancy.  I discussed ultrasound protocol of monitoring pregnancy is complicated with hypertension. I encouraged the patient to take low-dose aspirin  (patient is already taking low-dose aspirin  now) till delivery.  Aspirin  delays or prevents preeclampsia.  Previous cesarean delivery I reassured the patient of normal placental location. However, placental location needs to be confirmed at fetal anatomy scan.  I discussed the benefits and risks of VBAC.  The risk of uterine scar dehiscence is about 1%.  Cesarean deliveries increase the risks of hemorrhage, infection and venous thromboembolism.  Repeat cesarean deliveries increase the risks of placenta previa and/or placenta accreta spectrum. Long-term complications include small bowel  obstruction.  Patient is firm in her decision to have repeat cesarean delivery. R/o hyperthyroidism It is more likely patient has gestational  thyrotoxicosis (beta hCG effect), which is likely to resolve with advancing gestation.  Patient will have endocrinology consultation.  We will address hyperthyroidism if diagnosis is confirmed.  Chronic inflammatory demyelinating polyradiculopathy (CIDP) Her neuropathy is well-controlled with IVIG and Solu-Medrol  infusions.  Patient is aware that Solu-Medrol  is associated with worsening of hyperglycemia.  IVIG treatment is not associated with an increased risk of congenital malformations.  Recommendations - An appointment was made for her to return in 10 weeks for fetal anatomy scan. - She has an appointment for fetal echocardiography. - Fetal growth assessments every 4 weeks after anatomy scan. - Weekly antenatal testing from [redacted] weeks gestation until delivery. - Continue low-dose aspirin  prophylaxis. - Follow-up with endocrinology.   Consultation including face-to-face (more than 50%) counseling 60 minutes.

## 2024-06-16 ENCOUNTER — Ambulatory Visit

## 2024-06-16 ENCOUNTER — Other Ambulatory Visit: Payer: Self-pay | Admitting: Obstetrics and Gynecology

## 2024-06-16 ENCOUNTER — Encounter: Payer: Self-pay | Admitting: Maternal & Fetal Medicine

## 2024-06-16 ENCOUNTER — Ambulatory Visit (HOSPITAL_BASED_OUTPATIENT_CLINIC_OR_DEPARTMENT_OTHER): Admitting: Maternal & Fetal Medicine

## 2024-06-16 ENCOUNTER — Ambulatory Visit: Attending: Obstetrics and Gynecology

## 2024-06-16 VITALS — BP 109/67 | HR 99

## 2024-06-16 DIAGNOSIS — O34219 Maternal care for unspecified type scar from previous cesarean delivery: Secondary | ICD-10-CM

## 2024-06-16 DIAGNOSIS — E1069 Type 1 diabetes mellitus with other specified complication: Secondary | ICD-10-CM | POA: Diagnosis present

## 2024-06-16 DIAGNOSIS — O09299 Supervision of pregnancy with other poor reproductive or obstetric history, unspecified trimester: Secondary | ICD-10-CM | POA: Insufficient documentation

## 2024-06-16 DIAGNOSIS — O10012 Pre-existing essential hypertension complicating pregnancy, second trimester: Secondary | ICD-10-CM | POA: Diagnosis not present

## 2024-06-16 DIAGNOSIS — O26872 Cervical shortening, second trimester: Secondary | ICD-10-CM

## 2024-06-16 DIAGNOSIS — Z3A20 20 weeks gestation of pregnancy: Secondary | ICD-10-CM | POA: Diagnosis not present

## 2024-06-16 DIAGNOSIS — Z993 Dependence on wheelchair: Secondary | ICD-10-CM | POA: Insufficient documentation

## 2024-06-16 DIAGNOSIS — O24012 Pre-existing diabetes mellitus, type 1, in pregnancy, second trimester: Secondary | ICD-10-CM | POA: Diagnosis not present

## 2024-06-16 DIAGNOSIS — O10911 Unspecified pre-existing hypertension complicating pregnancy, first trimester: Secondary | ICD-10-CM

## 2024-06-16 DIAGNOSIS — O99282 Endocrine, nutritional and metabolic diseases complicating pregnancy, second trimester: Secondary | ICD-10-CM

## 2024-06-16 DIAGNOSIS — O285 Abnormal chromosomal and genetic finding on antenatal screening of mother: Secondary | ICD-10-CM

## 2024-06-16 DIAGNOSIS — E039 Hypothyroidism, unspecified: Secondary | ICD-10-CM | POA: Diagnosis not present

## 2024-06-16 NOTE — Progress Notes (Signed)
 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   Encounter for long-term (current) use of other medications 11/09/2012   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 [id number] at [redacted]w[redacted]d here for ultrasound and consultation. She had low risk aneuploidy screening of a female fetus.    Today we focused on the following:   The patient is here today for a detailed ultrasound and consultation for medical complexity. On todays transvaginal ultrasound, a short cervix is identified. Cervical length measurements range from 1.9 to 2.8 cm, with the most reproducible measurements between 2.3 and 2.6 cm. I reviewed the definition of a short cervix and its association with increased risk of spontaneous preterm birth. We discussed management options including symptom monitoring, serial cervical length surveillance, and vaginal progesterone therapy. Given todays findings, the recommendation  is to initiate vaginal progesterone 200 mg nightly and repeat a transvaginal cervical length in 2 weeks to assess for progression. We also reviewed cerclage criteria, including cervical dilation or cervical length <1.0 cm prior to [redacted] weeks gestation, in the absence of infection, labor, or placental abruption.  The patient was diagnosed with type 1 diabetes mellitus in 2000. She denies any history of clinically significant diabetic ketoacidosis or known end-organ damage. She is currently managed with an insulin  pump. She does not know her precise time-in-range, but reports fasting glucose values around 120 mg/dL and 2-hour postprandial values in a similar range, except during periods when she receives steroid therapy, at which time glucose values may range from 200-400 mg/dL, requiring significant insulin  adjustments. She occasionally uses long-acting insulin  during periods of Solu-Medrol  infusions. Her endocrinologist is Dr. Cindie in [place], and she follows with ophthalmology under Dr. Estelle in [place]. She reports no neuropathy, nephropathy, or other diabetic end-organ dysfunction. She is aware of the possible complications during pregnancy with suboptimal control and the need to have at least 75% of values at goal to reduce the risk of things like stillbirth and placental dysfunction.   The patient has a diagnosis of chronic inflammatory demyelinating polyneuropathy (CIDP), diagnosed around the same time as her diabetes. She reports longstanding right-leg weakness since childhood, though the formal diagnosis was made later. Her CIDP is treated with weekly IVIG infusions and intermittent Solu-Medrol , administered at home. We discussed that CIDP is not consistently exacerbated by pregnancy, though the postpartum period carries increased relapse risk. She is at increased fall risk and should continue to ambulate with her walker and wheelchair as prescribed. We reviewed that IVIG is acceptable in  pregnancy when benefits outweigh risks. Solu-Medrol  does not cross the placenta, but may worsen hyperglycemia and  hypertension. She was counseled to monitor for any new neurologic symptoms. Her baseline neurologic deficits include legal blindness of the left eye, reduced smell and taste perception, and right-leg weakness with wheelchair dependence. She otherwise has intact speech, hearing, upper-extremity function, and left lower-extremity function, without sensory deficits.  The patient also has chronic hypertension, currently treated with labetalol  300 mg twice daily. Her blood pressure today is 109/67 mmHg. We discussed a target blood pressure range of approximately 120-130/70-80 mmHg, and she will decrease labetalol  to 200 mg twice daily. She is compliant with low-dose aspirin  for preeclampsia risk reduction. We reviewed her prior obstetric history, including cesarean delivery at [redacted]w[redacted]d for preeclampsia, and discussed the recurrent risks associated with hypertensive disease in pregnancy.  The patient also has Wyburn-Mason syndrome, previously treated with laser therapy, complicated by glaucoma and cataracts, requiring a left ocular shunt, with residual legal blindness in that eye. She is not aware of any additional systemic arteriovenous malformations. We discussed that intraocular pressure typically decreases by approximately 10-20% early in pregnancy, normalizes later, and may slightly increase toward term. She will continue to monitor for ocular symptoms and follow closely with ophthalmology.  She reports a history of failed spinal anesthesia during her prior cesarean delivery, likely related to scoliosis. She strongly prefers early antenatal anesthesia consultation to assess feasibility of neuraxial anesthesia, as she wishes to remain awake for a planned repeat cesarean delivery and tubal ligation. We discussed the possibility of stress-dose steroids, though this is unlikely given her current steroid  exposure pattern. I reviewed that stress-dose steroids are typically indicated if a patient has been on >=20 mg prednisone -equivalent daily for >=3 consecutive weeks, which she has not required previously.  She has a complex thyroid  history, reporting periods of hyperthyroidism and hypothyroidism in the past. Following her most recent delivery, her thyroid  function normalized without medication. Earlier in this pregnancy, she had a slightly suppressed TSH with a normal free T4, which is consistent with normal pregnancy physiology related to hCG-mediated TSH suppression, not true hyperthyroidism. We discussed serial thyroid  monitoring, with TSH and free T4 at least once per trimester, and every 4-6 weeks if abnormalities arise. Given her diabetes history, she should also have serial CMPs and CBCs to monitor renal and metabolic function, a baseline 75-ynlm urine, and a baseline EKG, with echocardiography if abnormalities are identified.  She will return in 2 weeks for repeat transvaginal cervical length and will begin vaginal progesterone 200 mg nightly, prescribed by her OB provider later today. She will undergo a growth ultrasound in 4 weeks to reassess fetal anatomy. A fetal echocardiogram is already scheduled due to increased risk of congenital heart disease in the setting of type 1 diabetes. She will have serial growth ultrasounds every 4-6 weeks and antenatal testing, beginning at 28-32 weeks, depending on glycemic control, with escalation to twice-weekly testing if control worsens or additional fetal concerns arise. We reviewed signs and symptoms of preterm labor and the importance of close follow-up with her subspecialists.  We discussed venous thromboembolism risk and prophylaxis, particularly in the context of her neurologic limitations and pregnancy. Postpartum prophylactic anticoagulation with enoxaparin  (Lovenox ) may be considered if she were to become significantly non-ambulatory, especially  following a cesarean delivery, when VTE risk is highest. At this time, the patient reports that she is able to ambulate approximately one mile per day with her walker, in addition to regular wheelchair use, which is reassuring. We emphasized the importance of fall prevention, given her prior history of a subdural  hematoma following a fall while ambulating uphill, and discussed balancing mobility, safety, and thrombosis risk. Ongoing reassessment of mobility status in the late antepartum and postpartum periods will be important to guide prophylaxis decisions.  We discussed in depth each potential pregnancy complication associated with each disease and how best to manage it with all of her questions answered.   Sonographic findings Single intrauterine pregnancy at 20w 2d. Fetal cardiac activity:  Observed and appears normal. Presentation: Breech. The anatomic structures that were well seen appear normal. Due to poor acoustic windows some structures remain suboptimally visualized. Fetal biometry shows the estimated fetal weight of 0 lb 12 oz, 328 grams (31%). Amniotic fluid: Within normal limits.  MVP: 6.4 cm. Placenta: Posterior. Adnexa: No abnormality visualized. Cervical length: 2 cm (average).  RECOMMENDATIONS -Start vaginal progesterone 200 mg nightly -Repeat transvaginal cervical length in 2 weeks -Monitor closely for signs and symptoms of preterm labor -Continue multidisciplinary care with endocrinology, neurology, ophthalmology, cardiology, and anesthesia -Perform serial growth ultrasounds every 4-6 weeks -Complete fetal echocardiogram as scheduled -Initiate antenatal testing at 28-32 weeks based on glycemic control -Increase antenatal testing frequency if diabetes or hypertension worsens -Continue aspirin  therapy and blood pressure management -Obtain baseline 24-hour urine collection -Obtain baseline electrocardiogram -Monitor serial thyroid  function tests -Monitor serial renal and  metabolic laboratory studies -Thyroid  labs refect normal pregnancy physiology but given her history of thyroid  abnormalities she should have TSH/FT4 every 4-8 weeks. Currently does not need treatment.  -Monitor neurologic function and report any new deficits -Use walker and wheelchair as instructed -Ongoing reassessment of mobility status in the late antepartum and postpartum periods will be important to guide anticoagulation decisions.  -Anesthesia consult before admission due to history of failed spinal and desir  60 minutes of time was spent reviewing the patient's chart including labs, imaging and documentation.  At least 50% of this time was spent with direct patient care discussing the diagnosis, management and prognosis of her care.  Review of Systems: A review of systems was performed and was negative except per HPI   Past Obstetrical History:  OB History  Gravida Para Term Preterm AB Living  4 1  1 2 1   SAB IAB Ectopic Multiple Live Births  2   0 1    # Outcome Date GA Lbr Len/2nd Weight Sex Type Anes PTL Lv  4 Current           3 Preterm 02/27/22 [redacted]w[redacted]d  4 lb 13.3 oz (2.19 kg) M CS-LTranv Gen  LIV  2 SAB           1 SAB              Past Medical History:  Past Medical History:  Diagnosis Date   Anxiety    Brain bleed (HCC)    Patient stated   Celiac disease    Chest pain 06/12/2016   Chronic tension type headache 11/09/2012   CIDP (chronic inflammatory demyelinating polyneuropathy) (HCC) 2001   Diabetes type I (HCC)    Dr. 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   Insomnia, unspecified 11/09/2012   KNEE PAIN, RIGHT 01/26/2008   Qualifier: Diagnosis of   By: Antonio ROSALEA Rockers         Macular degeneration  Macular degeneration (senile) of retina 10/07/2006   Qualifier: Diagnosis  of   By: Antonio DO, Jamee         Migraine without aura 11/09/2012   Palpitations 06/12/2016   Pilonidal cyst    Scoliosis    Strep throat 10/16/2017   Thyroiditis, autoimmune 02/25/2012   Thyrotoxicosis with diffuse goiter 02/25/2012   UNSPECIFIED ANEMIA 03/27/2009   Qualifier: Diagnosis of   By: Antonio ROSALEA Jamee         UNSPECIFIED TACHYCARDIA 12/07/2007   Qualifier: Diagnosis of   By: Antonio ROSALEA Jamee         Vaginal Pap smear, abnormal    Wyburn-Mason syndrome      Past Surgical History:    Past Surgical History:  Procedure Laterality Date   CATARACT EXTRACTION  06/25/06   CESAREAN SECTION N/A 02/27/2022   Procedure: CESAREAN SECTION;  Surgeon: Marget Lenis, MD;  Location: [facility]S;  Service: Obstetrics;  Laterality: N/A;   FOOT ARTHROPLASTY     left foot moved and stretched tendons    GLAUCOMA SURGERY  02/12/06   HIP ARTHROPLASTY     right never bipopsy   PORTACATH PLACEMENT  2002-2004   removal of vascular port     Sciatic Nerve Muscle Biopsy       Home Medications:   Medications Ordered Prior to Encounter[1]    Allergies:   Allergies[2]   Physical Exam:   Vitals:   06/16/24 0804  BP: 109/67  Pulse: 99   Sitting comfortably on the sonogram table Nonlabored breathing Normal rate and rhythm Abdomen is nontender  Thank you for the opportunity to be involved with this patient's care. Please let us  know if we can be of any further assistance.   Delora Smaller MFM, [place]   06/16/2024  11:14 AM      [1]  Current Outpatient Medications on File Prior to Visit  Medication Sig Dispense Refill   aspirin  EC 81 MG tablet Take 81 mg by mouth daily. Swallow whole.     Insulin  Degludec (TRESIBA  [place]) Inject 15 Units into the skin in the morning.     insulin  lispro (HUMALOG ) 100 UNIT/ML injection Inject 1.5 Units into the skin continuous as needed for high blood sugar (insulin  pump).     labetalol  (NORMODYNE ) 300 MG tablet Take 1 tablet (300 mg total) by mouth 3  (three) times daily. 90 tablet 0   methylPREDNISolone  Sodium Succ (SOLU-MEDROL  IJ) Inject as directed once a week.     Multiple Vitamin (MULTIVITAMIN ADULT PO)      Prenatal Vit-Fe Fumarate-FA (PRENATAL MULTIVITAMIN) TABS tablet Take 1 tablet by mouth daily at 12 noon.     progesterone (PROMETRIUM) 200 MG capsule Take 200 mg by mouth at bedtime.     acetaminophen  (TYLENOL ) 325 MG tablet Take 2 tablets (650 mg total) by mouth every 4 (four) hours as needed for mild pain (temperature > 101.5.). (Patient not taking: Reported on 04/07/2024) 30 tablet 0   Continuous Glucose Sensor (DEXCOM G7 SENSOR) MISC APLY 1 SENSOR EVERY 10 DAYS     ibuprofen  (MOTRIN  IB) 200 MG tablet Take 2 tablets (400 mg total) by mouth every 6 (six) hours as needed. (Patient not taking: Reported on 04/07/2024) 30 tablet 0   Immune Globulin  10% (GAMMAGARD) 10 GM/100ML SOLN Infuse 700 mL (70 g total) into a venous catheter once a week.     No current facility-administered medications on file prior to visit.  [2]  Allergies Allergen Reactions  Other Other (See Comments)    Wheat Gluten Extract--causes GI irritation Pistachios- anaphylactic shock    Ginger Rash   "

## 2024-06-24 ENCOUNTER — Encounter: Payer: Self-pay | Admitting: Obstetrics and Gynecology

## 2024-06-24 ENCOUNTER — Ambulatory Visit: Admitting: Endocrinology

## 2024-07-02 ENCOUNTER — Other Ambulatory Visit

## 2024-07-02 ENCOUNTER — Ambulatory Visit

## 2024-07-16 ENCOUNTER — Ambulatory Visit
